# Patient Record
Sex: Male | Born: 1935 | Race: Black or African American | Hispanic: No | Marital: Married | Smoking: Former smoker
Health system: Southern US, Community
[De-identification: ages and names within clinical notes are randomized; demographics above are authoritative.]

## PROBLEM LIST (undated history)

## (undated) DIAGNOSIS — D649 Anemia, unspecified: Secondary | ICD-10-CM

## (undated) DIAGNOSIS — N179 Acute kidney failure, unspecified: Secondary | ICD-10-CM

## (undated) DIAGNOSIS — I739 Peripheral vascular disease, unspecified: Secondary | ICD-10-CM

## (undated) DIAGNOSIS — K2970 Gastritis, unspecified, without bleeding: Secondary | ICD-10-CM

## (undated) DIAGNOSIS — C61 Malignant neoplasm of prostate: Secondary | ICD-10-CM

## (undated) DIAGNOSIS — J45909 Unspecified asthma, uncomplicated: Secondary | ICD-10-CM

## (undated) DIAGNOSIS — Z8711 Personal history of peptic ulcer disease: Secondary | ICD-10-CM

## (undated) DIAGNOSIS — J189 Pneumonia, unspecified organism: Secondary | ICD-10-CM

## (undated) DIAGNOSIS — J449 Chronic obstructive pulmonary disease, unspecified: Secondary | ICD-10-CM

## (undated) DIAGNOSIS — E871 Hypo-osmolality and hyponatremia: Secondary | ICD-10-CM

## (undated) DIAGNOSIS — I2510 Atherosclerotic heart disease of native coronary artery without angina pectoris: Secondary | ICD-10-CM

## (undated) DIAGNOSIS — I209 Angina pectoris, unspecified: Secondary | ICD-10-CM

## (undated) DIAGNOSIS — M1711 Unilateral primary osteoarthritis, right knee: Secondary | ICD-10-CM

## (undated) DIAGNOSIS — R0602 Shortness of breath: Secondary | ICD-10-CM

## (undated) DIAGNOSIS — E46 Unspecified protein-calorie malnutrition: Secondary | ICD-10-CM

## (undated) DIAGNOSIS — R339 Retention of urine, unspecified: Secondary | ICD-10-CM

## (undated) DIAGNOSIS — K219 Gastro-esophageal reflux disease without esophagitis: Secondary | ICD-10-CM

## (undated) DIAGNOSIS — I10 Essential (primary) hypertension: Secondary | ICD-10-CM

## (undated) DIAGNOSIS — J42 Unspecified chronic bronchitis: Secondary | ICD-10-CM

## (undated) DIAGNOSIS — R079 Chest pain, unspecified: Secondary | ICD-10-CM

## (undated) DIAGNOSIS — E785 Hyperlipidemia, unspecified: Secondary | ICD-10-CM

## (undated) DIAGNOSIS — Z8639 Personal history of other endocrine, nutritional and metabolic disease: Secondary | ICD-10-CM

## (undated) DIAGNOSIS — G43909 Migraine, unspecified, not intractable, without status migrainosus: Secondary | ICD-10-CM

## (undated) DIAGNOSIS — I509 Heart failure, unspecified: Secondary | ICD-10-CM

## (undated) DIAGNOSIS — D759 Disease of blood and blood-forming organs, unspecified: Secondary | ICD-10-CM

## (undated) DIAGNOSIS — Z8719 Personal history of other diseases of the digestive system: Secondary | ICD-10-CM

## (undated) HISTORY — PX: TONSILLECTOMY: SUR1361

## (undated) HISTORY — DX: Retention of urine, unspecified: R33.9

## (undated) HISTORY — DX: Gastritis, unspecified, without bleeding: K29.70

## (undated) HISTORY — DX: Hypo-osmolality and hyponatremia: E87.1

## (undated) HISTORY — DX: Unilateral primary osteoarthritis, right knee: M17.11

## (undated) HISTORY — DX: Chronic obstructive pulmonary disease, unspecified: J44.9

## (undated) HISTORY — PX: JOINT REPLACEMENT: SHX530

## (undated) HISTORY — DX: Essential (primary) hypertension: I10

## (undated) HISTORY — DX: Personal history of other endocrine, nutritional and metabolic disease: Z86.39

## (undated) HISTORY — DX: Anemia, unspecified: D64.9

## (undated) HISTORY — DX: Personal history of peptic ulcer disease: Z87.11

## (undated) HISTORY — DX: Hyperlipidemia, unspecified: E78.5

## (undated) HISTORY — PX: PROSTATECTOMY: SHX69

## (undated) HISTORY — PX: APPENDECTOMY: SHX54

---
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
## 1959-04-13 DIAGNOSIS — G43909 Migraine, unspecified, not intractable, without status migrainosus: Secondary | ICD-10-CM

## 1959-04-13 HISTORY — DX: Migraine, unspecified, not intractable, without status migrainosus: G43.909

## 1998-12-29 ENCOUNTER — Other Ambulatory Visit: Admission: RE | Admit: 1998-12-29 | Discharge: 1998-12-29 | Payer: Self-pay | Admitting: Urology

## 1999-02-22 ENCOUNTER — Encounter: Payer: Self-pay | Admitting: Urology

## 1999-02-26 ENCOUNTER — Inpatient Hospital Stay (HOSPITAL_COMMUNITY): Admission: RE | Admit: 1999-02-26 | Discharge: 1999-03-02 | Payer: Self-pay | Admitting: Urology

## 1999-02-26 ENCOUNTER — Encounter (INDEPENDENT_AMBULATORY_CARE_PROVIDER_SITE_OTHER): Payer: Self-pay | Admitting: Specialist

## 2002-06-11 ENCOUNTER — Encounter: Admission: RE | Admit: 2002-06-11 | Discharge: 2002-06-11 | Payer: Self-pay | Admitting: Family Medicine

## 2002-06-11 ENCOUNTER — Encounter: Payer: Self-pay | Admitting: Family Medicine

## 2003-07-06 ENCOUNTER — Encounter: Admission: RE | Admit: 2003-07-06 | Discharge: 2003-07-06 | Payer: Self-pay | Admitting: Family Medicine

## 2003-07-21 ENCOUNTER — Emergency Department (HOSPITAL_COMMUNITY): Admission: EM | Admit: 2003-07-21 | Discharge: 2003-07-21 | Payer: Self-pay | Admitting: *Deleted

## 2004-05-07 ENCOUNTER — Emergency Department (HOSPITAL_COMMUNITY): Admission: EM | Admit: 2004-05-07 | Discharge: 2004-05-07 | Payer: Self-pay | Admitting: Emergency Medicine

## 2004-08-02 ENCOUNTER — Emergency Department (HOSPITAL_COMMUNITY): Admission: EM | Admit: 2004-08-02 | Discharge: 2004-08-02 | Payer: Self-pay

## 2006-05-12 ENCOUNTER — Ambulatory Visit: Payer: Self-pay | Admitting: Gastroenterology

## 2006-05-22 ENCOUNTER — Ambulatory Visit: Payer: Self-pay | Admitting: Gastroenterology

## 2006-08-13 ENCOUNTER — Emergency Department (HOSPITAL_COMMUNITY): Admission: EM | Admit: 2006-08-13 | Discharge: 2006-08-13 | Payer: Self-pay | Admitting: Emergency Medicine

## 2006-08-14 ENCOUNTER — Inpatient Hospital Stay (HOSPITAL_COMMUNITY): Admission: EM | Admit: 2006-08-14 | Discharge: 2006-08-24 | Payer: Self-pay | Admitting: Emergency Medicine

## 2007-04-24 ENCOUNTER — Emergency Department (HOSPITAL_COMMUNITY): Admission: EM | Admit: 2007-04-24 | Discharge: 2007-04-24 | Payer: Self-pay | Admitting: Emergency Medicine

## 2007-11-02 ENCOUNTER — Encounter: Admission: RE | Admit: 2007-11-02 | Discharge: 2007-11-02 | Payer: Self-pay | Admitting: Internal Medicine

## 2008-10-04 ENCOUNTER — Encounter: Admission: RE | Admit: 2008-10-04 | Discharge: 2008-10-04 | Payer: Self-pay | Admitting: Internal Medicine

## 2009-09-20 ENCOUNTER — Inpatient Hospital Stay (HOSPITAL_COMMUNITY): Admission: EM | Admit: 2009-09-20 | Discharge: 2009-09-25 | Payer: Self-pay | Admitting: Emergency Medicine

## 2009-09-21 ENCOUNTER — Encounter (INDEPENDENT_AMBULATORY_CARE_PROVIDER_SITE_OTHER): Payer: Self-pay | Admitting: Internal Medicine

## 2010-10-31 LAB — CARDIAC PANEL(CRET KIN+CKTOT+MB+TROPI)
CK, MB: 5.9 ng/mL — ABNORMAL HIGH (ref 0.3–4.0)
Relative Index: 1.1 (ref 0.0–2.5)
Troponin I: 0.03 ng/mL (ref 0.00–0.06)

## 2010-10-31 LAB — GLUCOSE, CAPILLARY
Glucose-Capillary: 103 mg/dL — ABNORMAL HIGH (ref 70–99)
Glucose-Capillary: 104 mg/dL — ABNORMAL HIGH (ref 70–99)
Glucose-Capillary: 113 mg/dL — ABNORMAL HIGH (ref 70–99)
Glucose-Capillary: 114 mg/dL — ABNORMAL HIGH (ref 70–99)
Glucose-Capillary: 117 mg/dL — ABNORMAL HIGH (ref 70–99)
Glucose-Capillary: 124 mg/dL — ABNORMAL HIGH (ref 70–99)
Glucose-Capillary: 124 mg/dL — ABNORMAL HIGH (ref 70–99)
Glucose-Capillary: 143 mg/dL — ABNORMAL HIGH (ref 70–99)
Glucose-Capillary: 211 mg/dL — ABNORMAL HIGH (ref 70–99)

## 2010-10-31 LAB — CBC
HCT: 29.4 % — ABNORMAL LOW (ref 39.0–52.0)
HCT: 30.1 % — ABNORMAL LOW (ref 39.0–52.0)
HCT: 36.5 % — ABNORMAL LOW (ref 39.0–52.0)
Hemoglobin: 12.6 g/dL — ABNORMAL LOW (ref 13.0–17.0)
MCHC: 34.4 g/dL (ref 30.0–36.0)
MCV: 87.1 fL (ref 78.0–100.0)
MCV: 87.8 fL (ref 78.0–100.0)
Platelets: 174 10*3/uL (ref 150–400)
Platelets: 186 10*3/uL (ref 150–400)
RBC: 3.46 MIL/uL — ABNORMAL LOW (ref 4.22–5.81)
RBC: 4.16 MIL/uL — ABNORMAL LOW (ref 4.22–5.81)
RDW: 13.3 % (ref 11.5–15.5)
RDW: 14.1 % (ref 11.5–15.5)
WBC: 12.2 10*3/uL — ABNORMAL HIGH (ref 4.0–10.5)
WBC: 4.5 10*3/uL (ref 4.0–10.5)

## 2010-10-31 LAB — CULTURE, BLOOD (ROUTINE X 2)
Culture: NO GROWTH
Culture: NO GROWTH

## 2010-10-31 LAB — BASIC METABOLIC PANEL
BUN: 11 mg/dL (ref 6–23)
CO2: 27 mEq/L (ref 19–32)
Calcium: 8.9 mg/dL (ref 8.4–10.5)
Chloride: 101 mEq/L (ref 96–112)
Creatinine, Ser: 0.91 mg/dL (ref 0.4–1.5)
GFR calc Af Amer: >60 mL/min (ref >60)
GFR calc non Af Amer: >60 mL/min (ref >60)
Glucose, Bld: 110 mg/dL — ABNORMAL HIGH (ref 70–99)
Potassium: 3.4 mEq/L — ABNORMAL LOW (ref 3.5–5.1)
Sodium: 137 mEq/L (ref 135–145)

## 2010-10-31 LAB — CULTURE, RESPIRATORY W GRAM STAIN

## 2010-10-31 LAB — DIFFERENTIAL
Basophils Relative: 1 % (ref 0–1)
Eosinophils Absolute: 0 10*3/uL (ref 0.0–0.7)
Lymphocytes Relative: 7 % — ABNORMAL LOW (ref 12–46)
Lymphs Abs: 0.9 10*3/uL (ref 0.7–4.0)
Monocytes Absolute: 2.3 10*3/uL — ABNORMAL HIGH (ref 0.1–1.0)
Neutro Abs: 8.9 10*3/uL — ABNORMAL HIGH (ref 1.7–7.7)

## 2010-10-31 LAB — LEGIONELLA ANTIGEN, URINE: Legionella Antigen, Urine: NEGATIVE

## 2010-10-31 LAB — COMPREHENSIVE METABOLIC PANEL
BUN: 15 mg/dL (ref 6–23)
CO2: 28 mEq/L (ref 19–32)
Chloride: 101 mEq/L (ref 96–112)
Creatinine, Ser: 1.02 mg/dL (ref 0.4–1.5)
GFR calc non Af Amer: >60 mL/min (ref >60)
Total Bilirubin: 0.3 mg/dL (ref 0.3–1.2)

## 2010-10-31 LAB — EXPECTORATED SPUTUM ASSESSMENT W GRAM STAIN, RFLX TO RESP C

## 2010-10-31 LAB — POCT CARDIAC MARKERS: CKMB, poc: 1.3 ng/mL (ref 1.0–8.0)

## 2010-11-16 ENCOUNTER — Encounter (HOSPITAL_COMMUNITY)
Admission: RE | Admit: 2010-11-16 | Discharge: 2010-11-16 | Disposition: A | Discharge status: Home / Self Care | Payer: Medicare Other | Source: Ambulatory Visit | Attending: Orthopaedic Surgery | Admitting: Orthopaedic Surgery

## 2010-11-16 DIAGNOSIS — Z01818 Encounter for other preprocedural examination: Secondary | ICD-10-CM | POA: Insufficient documentation

## 2010-11-16 DIAGNOSIS — Z01812 Encounter for preprocedural laboratory examination: Secondary | ICD-10-CM | POA: Insufficient documentation

## 2010-11-16 LAB — CBC
HCT: 33.2 % — ABNORMAL LOW (ref 39.0–52.0)
Hemoglobin: 11.2 g/dL — ABNORMAL LOW (ref 13.0–17.0)
MCH: 28.6 pg (ref 26.0–34.0)
MCHC: 33.7 g/dL (ref 30.0–36.0)
MCV: 84.7 fL (ref 78.0–100.0)

## 2010-11-16 LAB — BASIC METABOLIC PANEL
BUN: 20 mg/dL (ref 6–23)
Chloride: 107 mEq/L (ref 96–112)
Glucose, Bld: 104 mg/dL — ABNORMAL HIGH (ref 70–99)
Potassium: 4.1 mEq/L (ref 3.5–5.1)
Sodium: 136 mEq/L (ref 135–145)

## 2010-11-16 LAB — ABO/RH: ABO/RH(D): A POS

## 2010-11-22 ENCOUNTER — Inpatient Hospital Stay (HOSPITAL_COMMUNITY)
Admission: RE | Admit: 2010-11-22 | Discharge: 2010-12-04 | DRG: 470 | Disposition: A | Discharge status: Home Health | Payer: Medicare Other | Source: Ambulatory Visit | Attending: Orthopaedic Surgery | Admitting: Orthopaedic Surgery

## 2010-11-22 ENCOUNTER — Other Ambulatory Visit (HOSPITAL_COMMUNITY): Payer: Self-pay | Admitting: Orthopaedic Surgery

## 2010-11-22 ENCOUNTER — Ambulatory Visit (HOSPITAL_COMMUNITY)
Admission: RE | Admit: 2010-11-22 | Discharge: 2010-11-22 | Disposition: A | Discharge status: Home / Self Care | Payer: Medicare Other | Source: Ambulatory Visit | Attending: Orthopaedic Surgery | Admitting: Orthopaedic Surgery

## 2010-11-22 DIAGNOSIS — B3781 Candidal esophagitis: Secondary | ICD-10-CM | POA: Diagnosis present

## 2010-11-22 DIAGNOSIS — D509 Iron deficiency anemia, unspecified: Secondary | ICD-10-CM | POA: Diagnosis not present

## 2010-11-22 DIAGNOSIS — E876 Hypokalemia: Secondary | ICD-10-CM | POA: Diagnosis not present

## 2010-11-22 DIAGNOSIS — M1990 Unspecified osteoarthritis, unspecified site: Secondary | ICD-10-CM

## 2010-11-22 DIAGNOSIS — Z8546 Personal history of malignant neoplasm of prostate: Secondary | ICD-10-CM

## 2010-11-22 DIAGNOSIS — R339 Retention of urine, unspecified: Secondary | ICD-10-CM | POA: Diagnosis not present

## 2010-11-22 DIAGNOSIS — M1710 Unilateral primary osteoarthritis, unspecified knee: Principal | ICD-10-CM | POA: Diagnosis present

## 2010-11-22 DIAGNOSIS — I10 Essential (primary) hypertension: Secondary | ICD-10-CM | POA: Diagnosis present

## 2010-11-22 DIAGNOSIS — J449 Chronic obstructive pulmonary disease, unspecified: Secondary | ICD-10-CM | POA: Diagnosis present

## 2010-11-22 DIAGNOSIS — K5900 Constipation, unspecified: Secondary | ICD-10-CM | POA: Diagnosis not present

## 2010-11-22 DIAGNOSIS — K219 Gastro-esophageal reflux disease without esophagitis: Secondary | ICD-10-CM | POA: Diagnosis present

## 2010-11-22 DIAGNOSIS — Z7982 Long term (current) use of aspirin: Secondary | ICD-10-CM

## 2010-11-22 DIAGNOSIS — D62 Acute posthemorrhagic anemia: Secondary | ICD-10-CM | POA: Diagnosis not present

## 2010-11-22 DIAGNOSIS — R1013 Epigastric pain: Secondary | ICD-10-CM | POA: Diagnosis not present

## 2010-11-22 DIAGNOSIS — E871 Hypo-osmolality and hyponatremia: Secondary | ICD-10-CM | POA: Diagnosis not present

## 2010-11-22 DIAGNOSIS — IMO0002 Reserved for concepts with insufficient information to code with codable children: Secondary | ICD-10-CM | POA: Diagnosis not present

## 2010-11-22 DIAGNOSIS — N1330 Unspecified hydronephrosis: Secondary | ICD-10-CM | POA: Diagnosis present

## 2010-11-22 DIAGNOSIS — J4489 Other specified chronic obstructive pulmonary disease: Secondary | ICD-10-CM | POA: Diagnosis present

## 2010-11-22 DIAGNOSIS — N9989 Other postprocedural complications and disorders of genitourinary system: Secondary | ICD-10-CM | POA: Diagnosis not present

## 2010-11-22 HISTORY — PX: REPLACEMENT TOTAL KNEE: SUR1224

## 2010-11-22 LAB — COMPREHENSIVE METABOLIC PANEL
ALT: 14 U/L (ref 0–53)
AST: 27 U/L (ref 0–37)
Alkaline Phosphatase: 63 U/L (ref 39–117)
CO2: 22 mEq/L (ref 19–32)
Calcium: 8.5 mg/dL (ref 8.4–10.5)
Chloride: 105 mEq/L (ref 96–112)
GFR calc Af Amer: >60 mL/min (ref >60)
GFR calc non Af Amer: >60 mL/min (ref >60)
Glucose, Bld: 253 mg/dL — ABNORMAL HIGH (ref 70–99)
Sodium: 136 mEq/L (ref 135–145)
Total Bilirubin: 0.4 mg/dL (ref 0.3–1.2)

## 2010-11-22 LAB — LIPID PANEL
HDL: 58 mg/dL
Total CHOL/HDL Ratio: 2.9 ratio
Triglycerides: 52 mg/dL
VLDL: 10 mg/dL (ref 0–40)

## 2010-11-22 LAB — CARDIAC PANEL(CRET KIN+CKTOT+MB+TROPI)
CK, MB: 8.5 ng/mL (ref 0.3–4.0)
Relative Index: 3.4 — ABNORMAL HIGH (ref 0.0–2.5)
Total CK: 252 U/L — ABNORMAL HIGH (ref 7–232)
Troponin I: 0.03 ng/mL (ref 0.00–0.06)

## 2010-11-22 LAB — CBC
HCT: 22.9 % — ABNORMAL LOW (ref 39.0–52.0)
Hemoglobin: 7.7 g/dL — ABNORMAL LOW (ref 13.0–17.0)
MCHC: 33.6 g/dL (ref 30.0–36.0)
RBC: 2.68 MIL/uL — ABNORMAL LOW (ref 4.22–5.81)

## 2010-11-22 LAB — LIPASE, BLOOD: Lipase: 16 U/L (ref 11–59)

## 2010-11-23 LAB — CARDIAC PANEL(CRET KIN+CKTOT+MB+TROPI)
CK, MB: 7.3 ng/mL (ref 0.3–4.0)
Relative Index: 2.1 (ref 0.0–2.5)
Relative Index: 1.7 (ref 0.0–2.5)
Troponin I: 0.03 ng/mL (ref 0.00–0.06)

## 2010-11-23 LAB — BASIC METABOLIC PANEL
BUN: 12 mg/dL (ref 6–23)
Calcium: 8.4 mg/dL (ref 8.4–10.5)
Creatinine, Ser: 1.19 mg/dL (ref 0.4–1.5)
GFR calc non Af Amer: 59 mL/min — ABNORMAL LOW (ref >60)
Potassium: 4 mEq/L (ref 3.5–5.1)

## 2010-11-23 LAB — HEMOGLOBIN A1C: Hgb A1c MFr Bld: 6.6 % — ABNORMAL HIGH (ref <5.7)

## 2010-11-23 LAB — CBC
MCH: 28.7 pg (ref 26.0–34.0)
MCHC: 33.9 g/dL (ref 30.0–36.0)
MCV: 84.7 fL (ref 78.0–100.0)
Platelets: 167 10*3/uL (ref 150–400)
RBC: 2.61 MIL/uL — ABNORMAL LOW (ref 4.22–5.81)
RDW: 13.9 % (ref 11.5–15.5)

## 2010-11-23 LAB — IRON AND TIBC
Iron: 23 ug/dL — ABNORMAL LOW (ref 42–135)
Saturation Ratios: 7 % — ABNORMAL LOW (ref 20–55)
TIBC: 314 ug/dL (ref 215–435)

## 2010-11-23 LAB — PROTIME-INR: Prothrombin Time: 13.6 seconds (ref 11.6–15.2)

## 2010-11-24 ENCOUNTER — Inpatient Hospital Stay (HOSPITAL_COMMUNITY): Payer: Medicare Other

## 2010-11-24 LAB — BRAIN NATRIURETIC PEPTIDE: Pro B Natriuretic peptide (BNP): 345 pg/mL — ABNORMAL HIGH (ref 0.0–100.0)

## 2010-11-24 LAB — TYPE AND SCREEN

## 2010-11-24 LAB — CBC
HCT: 24.7 % — ABNORMAL LOW (ref 39.0–52.0)
Hemoglobin: 8.5 g/dL — ABNORMAL LOW (ref 13.0–17.0)
MCH: 28.7 pg (ref 26.0–34.0)
MCHC: 34.4 g/dL (ref 30.0–36.0)
RBC: 2.96 MIL/uL — ABNORMAL LOW (ref 4.22–5.81)

## 2010-11-24 LAB — BASIC METABOLIC PANEL
BUN: 10 mg/dL (ref 6–23)
Chloride: 91 mEq/L — ABNORMAL LOW (ref 96–112)
Glucose, Bld: 109 mg/dL — ABNORMAL HIGH (ref 70–99)
Potassium: 3.5 mEq/L (ref 3.5–5.1)
Sodium: 131 mEq/L — ABNORMAL LOW (ref 135–145)

## 2010-11-25 LAB — BASIC METABOLIC PANEL
BUN: 10 mg/dL (ref 6–23)
Chloride: 90 mEq/L — ABNORMAL LOW (ref 96–112)
GFR calc non Af Amer: >60 mL/min (ref >60)
Potassium: 3.7 mEq/L (ref 3.5–5.1)
Sodium: 132 mEq/L — ABNORMAL LOW (ref 135–145)

## 2010-11-25 LAB — PROTIME-INR
INR: 1.65 — ABNORMAL HIGH (ref 0.00–1.49)
Prothrombin Time: 19.7 seconds — ABNORMAL HIGH (ref 11.6–15.2)

## 2010-11-25 LAB — CBC
HCT: 20.5 % — ABNORMAL LOW (ref 39.0–52.0)
HCT: 21.4 % — ABNORMAL LOW (ref 39.0–52.0)
Hemoglobin: 7.2 g/dL — ABNORMAL LOW (ref 13.0–17.0)
Hemoglobin: 7.5 g/dL — ABNORMAL LOW (ref 13.0–17.0)
MCH: 29.4 pg (ref 26.0–34.0)
MCHC: 35.1 g/dL (ref 30.0–36.0)
MCV: 83.6 fL (ref 78.0–100.0)
RBC: 2.45 MIL/uL — ABNORMAL LOW (ref 4.22–5.81)
RDW: 13.7 % (ref 11.5–15.5)
WBC: 10.3 10*3/uL (ref 4.0–10.5)

## 2010-11-26 ENCOUNTER — Inpatient Hospital Stay (HOSPITAL_COMMUNITY): Payer: Medicare Other

## 2010-11-26 DIAGNOSIS — R634 Abnormal weight loss: Secondary | ICD-10-CM

## 2010-11-26 DIAGNOSIS — R1013 Epigastric pain: Secondary | ICD-10-CM

## 2010-11-26 LAB — CBC
HCT: 23 % — ABNORMAL LOW (ref 39.0–52.0)
MCH: 28.8 pg (ref 26.0–34.0)
MCHC: 35.2 g/dL (ref 30.0–36.0)
MCV: 81.9 fL (ref 78.0–100.0)
Platelets: 170 10*3/uL (ref 150–400)
RDW: 13.6 % (ref 11.5–15.5)

## 2010-11-26 LAB — IRON AND TIBC
Saturation Ratios: 19 % — ABNORMAL LOW (ref 20–55)
UIBC: 229 ug/dL

## 2010-11-26 LAB — LIPASE, BLOOD: Lipase: 15 U/L (ref 11–59)

## 2010-11-27 ENCOUNTER — Inpatient Hospital Stay (HOSPITAL_COMMUNITY): Payer: Medicare Other

## 2010-11-27 ENCOUNTER — Other Ambulatory Visit: Payer: Self-pay | Admitting: Internal Medicine

## 2010-11-27 DIAGNOSIS — B3781 Candidal esophagitis: Secondary | ICD-10-CM

## 2010-11-27 DIAGNOSIS — K2970 Gastritis, unspecified, without bleeding: Secondary | ICD-10-CM

## 2010-11-27 DIAGNOSIS — K2990 Gastroduodenitis, unspecified, without bleeding: Secondary | ICD-10-CM

## 2010-11-27 DIAGNOSIS — R1013 Epigastric pain: Secondary | ICD-10-CM

## 2010-11-27 DIAGNOSIS — R634 Abnormal weight loss: Secondary | ICD-10-CM

## 2010-11-27 LAB — CBC
HCT: 22.6 % — ABNORMAL LOW (ref 39.0–52.0)
Hemoglobin: 8 g/dL — ABNORMAL LOW (ref 13.0–17.0)
MCH: 29.1 pg (ref 26.0–34.0)
MCHC: 35.4 g/dL (ref 30.0–36.0)
MCV: 82.2 fL (ref 78.0–100.0)

## 2010-11-27 LAB — CROSSMATCH
Antibody Screen: NEGATIVE
Unit division: 0

## 2010-11-27 LAB — PROTIME-INR: Prothrombin Time: 23.4 seconds — ABNORMAL HIGH (ref 11.6–15.2)

## 2010-11-27 LAB — VITAMIN B12: Vitamin B-12: 1081 pg/mL — ABNORMAL HIGH (ref 211–911)

## 2010-11-27 MED ORDER — IOHEXOL 300 MG/ML  SOLN
80.0000 mL | Freq: Once | INTRAMUSCULAR | Status: AC | PRN
  Administered 2010-11-27: 80 mL via INTRAVENOUS

## 2010-11-28 DIAGNOSIS — B3781 Candidal esophagitis: Secondary | ICD-10-CM

## 2010-11-28 DIAGNOSIS — R1013 Epigastric pain: Secondary | ICD-10-CM

## 2010-11-28 DIAGNOSIS — R634 Abnormal weight loss: Secondary | ICD-10-CM

## 2010-11-28 LAB — CBC
HCT: 22.9 % — ABNORMAL LOW (ref 39.0–52.0)
Hemoglobin: 8 g/dL — ABNORMAL LOW (ref 13.0–17.0)
MCH: 28.7 pg (ref 26.0–34.0)
MCV: 82.1 fL (ref 78.0–100.0)
RBC: 2.79 MIL/uL — ABNORMAL LOW (ref 4.22–5.81)

## 2010-11-28 LAB — URINALYSIS, ROUTINE W REFLEX MICROSCOPIC
Bilirubin Urine: NEGATIVE
Specific Gravity, Urine: 1.009 (ref 1.005–1.030)
pH: 7.5 (ref 5.0–8.0)

## 2010-11-28 LAB — BASIC METABOLIC PANEL
CO2: 40 mEq/L (ref 19–32)
Chloride: 79 mEq/L — ABNORMAL LOW (ref 96–112)
Glucose, Bld: 100 mg/dL — ABNORMAL HIGH (ref 70–99)
Potassium: 2.4 mEq/L — CL (ref 3.5–5.1)
Sodium: 128 mEq/L — ABNORMAL LOW (ref 135–145)

## 2010-11-28 LAB — URINE MICROSCOPIC-ADD ON

## 2010-11-29 DIAGNOSIS — K2990 Gastroduodenitis, unspecified, without bleeding: Secondary | ICD-10-CM

## 2010-11-29 DIAGNOSIS — K2970 Gastritis, unspecified, without bleeding: Secondary | ICD-10-CM

## 2010-11-29 LAB — BASIC METABOLIC PANEL
BUN: 11 mg/dL (ref 6–23)
CO2: 36 mEq/L — ABNORMAL HIGH (ref 19–32)
Calcium: 8.3 mg/dL — ABNORMAL LOW (ref 8.4–10.5)
Creatinine, Ser: 1.28 mg/dL (ref 0.4–1.5)
GFR calc non Af Amer: 54 mL/min — ABNORMAL LOW (ref >60)
Glucose, Bld: 171 mg/dL — ABNORMAL HIGH (ref 70–99)
Sodium: 131 mEq/L — ABNORMAL LOW (ref 135–145)

## 2010-11-29 LAB — BASIC METABOLIC PANEL WITH GFR
BUN: 10 mg/dL (ref 6–23)
CO2: 41 meq/L (ref 19–32)
Calcium: 8.4 mg/dL (ref 8.4–10.5)
Chloride: 84 meq/L — ABNORMAL LOW (ref 96–112)
Creatinine, Ser: 1.11 mg/dL (ref 0.4–1.5)
GFR calc non Af Amer: >60 mL/min
Glucose, Bld: 104 mg/dL — ABNORMAL HIGH (ref 70–99)
Potassium: 3 meq/L — ABNORMAL LOW (ref 3.5–5.1)
Sodium: 131 meq/L — ABNORMAL LOW (ref 135–145)

## 2010-11-29 LAB — CBC
HCT: 21.2 % — ABNORMAL LOW (ref 39.0–52.0)
Hemoglobin: 7.4 g/dL — ABNORMAL LOW (ref 13.0–17.0)
MCH: 28.8 pg (ref 26.0–34.0)
MCHC: 34.9 g/dL (ref 30.0–36.0)
MCV: 82.5 fL (ref 78.0–100.0)
Platelets: 224 10*3/uL (ref 150–400)
RBC: 2.57 MIL/uL — ABNORMAL LOW (ref 4.22–5.81)
RDW: 12.8 % (ref 11.5–15.5)
WBC: 6.1 10*3/uL (ref 4.0–10.5)

## 2010-11-29 LAB — PROTIME-INR: Prothrombin Time: 38 seconds — ABNORMAL HIGH (ref 11.6–15.2)

## 2010-11-30 ENCOUNTER — Other Ambulatory Visit (HOSPITAL_COMMUNITY): Payer: Medicare Other

## 2010-11-30 ENCOUNTER — Inpatient Hospital Stay (HOSPITAL_COMMUNITY): Payer: Medicare Other

## 2010-11-30 LAB — PROTIME-INR: INR: 4.01 — ABNORMAL HIGH (ref 0.00–1.49)

## 2010-11-30 LAB — BASIC METABOLIC PANEL
BUN: 8 mg/dL (ref 6–23)
Calcium: 8.2 mg/dL — ABNORMAL LOW (ref 8.4–10.5)
GFR calc Af Amer: >60 mL/min (ref >60)
GFR calc non Af Amer: >60 mL/min (ref >60)
Glucose, Bld: 114 mg/dL — ABNORMAL HIGH (ref 70–99)
Sodium: 133 mEq/L — ABNORMAL LOW (ref 135–145)

## 2010-11-30 LAB — URINE CULTURE: Culture  Setup Time: 201204190456

## 2010-11-30 LAB — CBC
HCT: 21.3 % — ABNORMAL LOW (ref 39.0–52.0)
Hemoglobin: 7.3 g/dL — ABNORMAL LOW (ref 13.0–17.0)
MCV: 84.2 fL (ref 78.0–100.0)
RBC: 2.53 MIL/uL — ABNORMAL LOW (ref 4.22–5.81)
WBC: 7.4 10*3/uL (ref 4.0–10.5)

## 2010-12-01 ENCOUNTER — Inpatient Hospital Stay (HOSPITAL_COMMUNITY): Payer: Medicare Other

## 2010-12-01 LAB — BASIC METABOLIC PANEL
BUN: 9 mg/dL (ref 6–23)
Chloride: 93 mEq/L — ABNORMAL LOW (ref 96–112)
Creatinine, Ser: 1 mg/dL (ref 0.4–1.5)

## 2010-12-01 LAB — PROTIME-INR
INR: 3.68 — ABNORMAL HIGH (ref 0.00–1.49)
Prothrombin Time: 36.5 seconds — ABNORMAL HIGH (ref 11.6–15.2)

## 2010-12-01 LAB — CBC
HCT: 19.3 % — ABNORMAL LOW (ref 39.0–52.0)
MCHC: 34.2 g/dL (ref 30.0–36.0)
RDW: 13.5 % (ref 11.5–15.5)

## 2010-12-01 LAB — HEMOGLOBIN AND HEMATOCRIT, BLOOD: Hemoglobin: 8.5 g/dL — ABNORMAL LOW (ref 13.0–17.0)

## 2010-12-01 LAB — MAGNESIUM: Magnesium: 2.1 mg/dL (ref 1.5–2.5)

## 2010-12-02 ENCOUNTER — Inpatient Hospital Stay (HOSPITAL_COMMUNITY): Payer: Medicare Other

## 2010-12-02 LAB — BASIC METABOLIC PANEL
BUN: 10 mg/dL (ref 6–23)
CO2: 33 mEq/L — ABNORMAL HIGH (ref 19–32)
Calcium: 8.3 mg/dL — ABNORMAL LOW (ref 8.4–10.5)
Creatinine, Ser: 1.04 mg/dL (ref 0.4–1.5)
Glucose, Bld: 96 mg/dL (ref 70–99)
Sodium: 134 mEq/L — ABNORMAL LOW (ref 135–145)

## 2010-12-02 LAB — CROSSMATCH: Unit division: 0

## 2010-12-02 LAB — CBC
MCH: 29.3 pg (ref 26.0–34.0)
MCHC: 34.4 g/dL (ref 30.0–36.0)
MCV: 85 fL (ref 78.0–100.0)
Platelets: 406 10*3/uL — ABNORMAL HIGH (ref 150–400)
RDW: 13.7 % (ref 11.5–15.5)

## 2010-12-02 LAB — PROTIME-INR: Prothrombin Time: 32.6 seconds — ABNORMAL HIGH (ref 11.6–15.2)

## 2010-12-03 LAB — CBC
Hemoglobin: 8.5 g/dL — ABNORMAL LOW (ref 13.0–17.0)
MCH: 28.2 pg (ref 26.0–34.0)
MCHC: 33.1 g/dL (ref 30.0–36.0)
RDW: 13.8 % (ref 11.5–15.5)

## 2010-12-03 LAB — PROTIME-INR: Prothrombin Time: 27.6 seconds — ABNORMAL HIGH (ref 11.6–15.2)

## 2010-12-03 NOTE — Consult Note (Signed)
NAMEKELLON, Cristian Collins              ACCOUNT NO.:  [id number]  MEDICAL RECORD NO.:  [id number]           PATIENT TYPE:  O  LOCATION:  XRAY                         FACILITY:  [facility]  PHYSICIAN:  Mariea Stable, MD   DATE OF BIRTH:  1929/10/19  DATE OF CONSULTATION:  11/22/2010 DATE OF DISCHARGE:                                CONSULTATION   REASON FOR CONSULTATION:  Epigastric/chest pain postop.  HISTORY OF PRESENT ILLNESS:  Cristian Collins is a 77 year old gentleman with past medical history significant for COPD, hypertension, hyperlipidemia, partial gastrectomy for an ulcer in the 1950s, who presented for elective right total knee replacement.  The patient underwent surgery earlier today and after surgery requested some coffee.  The patient was given some coffee and subsequently developed some epigastric burning sensation radiating into the chest along with repeated dry heaving.  The patient was given some Mylanta followed by some Reglan along with morphine and over time the symptoms have relieved.  He still reports some ongoing discomfort, though this is a markedly improved.  He states he has really never had anything like this before.  He has difficulty describing it but says it was overall a mild discomfort associated with repeated dry heaving.  The discomfort did not radiate anywhere except for the areas mentioned above.  Again, he had multiple medications given including Mylanta, Reglan, morphine with some improvement over time.  Of note, the patient does report that he is active at home and does not experience any chest pain with his activities.  He does report some dyspnea on exertion which resolves with rest.  He also notes some left lower chest discomfort on occasion but has again difficulty elaborating on this.  EKG was obtained during the patient's reported chest pain and shows no acute ischemic changes.  Actually, it is a pretty normal electrocardiogram.  [facility] were consulted to assist with further workup.  PAST MEDICAL HISTORY: 1. Status post right total knee replacement postop day zero. 2. COPD. 3. Hyperlipidemia. 4. Hypertension. 5. History of prostate cancer status post prostatectomy. 6. Partial gastrectomy for an ulcer in 1950s.  MEDICATIONS: 1. Systane eyedrops 1 drop in bilateral eyes p.r.n. 2. Albuterol nebs 2.5 mg inhaled q.6 h. P.r.n. 3. Advair Diskus 250/50 mg 1 puff inhaled b.i.d. 4. Spiriva 18 mcg inhaled daily. 5. ProAir 1 puff inhaled q.4 h. P.r.n. 6. Amlodipine 10 mg p.o. daily. 7. Diovan, HCTZ 320/12.5 mg p.o. daily. 8. Lisinopril/HCTZ 20/12.5 mg p.o. daily.  ALLERGIES:  No known drug allergies.  FAMILY HISTORY:  Significant for breast cancer in his mother.  SOCIAL HISTORY:  The patient lives with his wife.  He quit smoking approximately 30 years ago and denies any alcohol or drug abuse.  REVIEW OF SYSTEMS:  As per HPI.  All others reviewed and negative.  PHYSICAL EXAMINATION:  VITAL SIGNS:  Temperature 97.6, blood pressure 186/70, pulse of 55, respirations 18, O2 saturation 100% on 2 L. GENERAL:  This is an elderly gentleman lying in bed in no acute distress. HEENT:  Head is normocephalic, atraumatic.  He is wearing glasses. Pupils are equally round and reactive to  light.  Extraocular movements are intact.  Sclerae anicteric.  There is arcus senilis present.  Mucous membranes are dry.  There are no oropharyngeal lesions. NECK:  Supple.  There is no JVD.  There is no carotid bruit. HEART:  Normal S1, S2 with a regular rate and rhythm.  There are no murmurs, gallops, or rubs.  PMI is displaced inferiorly.  LUNGS:  There is good air movement bilaterally, otherwise clear. ABDOMEN:  Positive bowel sounds, soft, nondistended.  There is a mild tenderness in the epigastric area with palpation that reproduces the discomfort mentioned in the HPI. EXTREMITIES:  The patient has no lower extremity edema on the  left side. The right lower extremity is bandaged and is currently undergoing passive range of motion. NEUROLOGIC:  The patient is awake, alert, and oriented x3.  Cranial nerves are grossly intact.  Motor appears intact times at least the bilateral upper and left lower extremity.  Sensation is grossly intact.  LABORATORY DATA:  Preop labs from April 6 include a WBC 3.6, hemoglobin 11.2, platelets 229.  PT of 12.9, INR 0.95.  Sodium 136, potassium 4.1, chloride 107, bicarb 24, glucose 104, BUN 20, creatinine 1.5, calcium 9.3. Electrocardiogram shows sinus rhythm with a ventricular rate of 62 and no acute ischemic changes.  ASSESSMENT/PLAN: 1. Epigastric/chest discomfort.  This is overall atypical presentation     for an acute coronary syndrome, however the patient does have risk     factors including male gender, age, hypertension, and     hyperlipidemia.  Per history and physical exam, most likely a GI     origin.  At this point, we will go ahead and transfer to Telemetry     to monitor for monitor for 23 hours and rule out acute coronary     syndrome with serial cardiac enzymes.  We will go ahead and check a     CBC to assess hemoglobin.  We will go ahead and risk stratify witha fasting lipid panel and a hemoglobin A1c.  We will also assess     for other GI etiologies with complete metabolic panel to assess     LFTs along with a lipase.  In the meantime, we will go ahead and     give the patient a GI cocktail.  He is surgically symptom free at     this point.  We will start Protonix 40 mg p.o. daily.  We will     provide Zofran on a p.r.n. basis as well as initiating aspirin 325     mg p.o. x1 dose now and sublingual nitroglycerin p.r.n. further     episodes of chest pain. 2. Status post right total knee replacement.  This will be per     Orthopedics. 3. Hyperlipidemia.  The patient was previously on a statin, although     his home medication list does not have that currently.  We  will     check a fasting lipid panel as per #1 and consider starting a     statin depending on whether     the patient rules in or not. 4. Hypertension.  We will continue the patient's home medications.  Thank you for allowing [place] to assist with the care of this very pleasant patient.     Mariea Stable, MD     MA/MEDQ  D:  11/22/2010  T:  11/22/2010  Job:  [id number]  Electronically Signed by Mariea Stable MD on 12/03/2010 10:30:01 AM

## 2010-12-11 NOTE — Discharge Summary (Addendum)
NAMEMarland Collins  TRAVANTE, KNEE              ACCOUNT NO.:  [id number]  MEDICAL RECORD NO.:  [id number]           PATIENT TYPE:  LOCATION:                                 FACILITY:  PHYSICIAN:  Lubertha Basque. Shaylan Tutton, M.D.DATE OF BIRTH:  12/16/1929  DATE OF ADMISSION:  11/22/2010 DATE OF DISCHARGE:  12/03/2010                              DISCHARGE SUMMARY   ADMITTING DIAGNOSES: 1. Right knee degenerative joint disease. 2. History of chronic obstructive pulmonary disease. 3. Hyperlipidemia. 4. Hypertension. 5. History of prostate cancer. 6. History of peptic ulcer disease with partial gastrectomy and     retropubic prostatectomy.  DISCHARGE DIAGNOSES: 1. Right knee degenerative joint disease. 2. History of chronic obstructive pulmonary disease. 3. Hyperlipidemia. 4. Hypertension. 5. History of prostate cancer. 6. History of peptic ulcer disease with partial gastrectomy and     retropubic prostatectomy. 7. Anemia. 8. Urinary retention and resolved hydronephrosis. 9. Gastritis and esophageal candida. 10.Hyponatremia and hypokalemia.  OPERATIONS:  Right total knee replacement.  BRIEF HISTORY:  Cristian Collins is a 77 year old male who is well-known to our practice who has had increasing right knee pain, pain with every step.  He has failed anti-inflammatory medicines and corticosteroid injections.  He is having pain with every step, trouble sleeping at nighttime as x-rays reveal end-stage DJD.  We have discussed treatment options with him that being total knee replacement on the right side.  PERTINENT LABORATORY AND X-RAY FINDINGS:  Hemoglobin 8.5, hematocrit 25.7.  Blood during his hospital stay was replaced as necessary, platelets of 468,000, MCV of 85.4.  Chem-7:  Sodium 134, potassium 4.3, chloride 95, BUN 10, creatinine 1.04, glucose 96.  GI lab work, LIP at 15, calcium 8.3.  Urinalysis showed trace leukocyte but essentially normal.  Coumadin was monitored by pharmacy and the last INR  was 2.56. X-rays and radiology reports showed some hydronephrosis after his postop urinary retention and was followed up with the ultrasound of his kidneys which showed no hydronephrosis or renal mass.  This was done December 01, 2010 and then our chest x-rays and abdomen x-rays which are inclusive in electronic medical record.  COURSE IN THE HOSPITAL:  He was admitted postoperatively and on the first day to postop, he was doing fine.  He started to develop some GI pains, nausea, vomiting, so [facility] were consulted.  They tried managing him with various medication changes and then later called in a GI consult who did an EGD which revealed gastritis and esophageal candida which he was treated for appropriately.  His hemoglobin did drop.  His blood was RBCs, replaced as necessary, and had difficulty voiding and had urinary retention on a bladder scan.  So a urology consultation was ordered.  A Foley catheter was put in and then a followup ultrasound of the kidneys was done which showed no evidence of hydronephrosis at that point. His wound on his knee during his entire hospital stay was benign.  No sign of infection and drainage.  Motion with the help of a CPM machine and physical therapy increased and last check was just a couple degrees from full extension to 95 to 100  degrees of full flexion.  Staples were left in until the 2-week postoperative mark and then it would be removed in our office.  He had during his hospital stay used an incentive spirometer, could be weightbearing as tolerated, and pulsating air stockings bilaterally throughout his entire hospital stay.  At different times, he had trouble with nausea and vomiting which eventually cleared and he was able to eat a essentially regular diet without nausea and vomiting.  Arrangements for home on discharge were made.  [facility] would be coming to his house for INR blood draws and physical therapy, weightbearing  as tolerated.  CONDITION ON DISCHARGE:  Improved.  FOLLOWUP:  He will follow up with his GI doctors and the neurologist per their request.  He will see Dr. Jerl Santos at April 27 or later which will be 2 weeks from the surgery date.  He will continue Coumadin with an INR between 2 and 3 until April 26 when he can discontinue it and maybe weightbearing as tolerated.  He will have physical therapy also through [facility] for motion and gait training and weightbearing as tolerated.  Any sign of infection to call [phone] our office number. His medicines can be found on the discharge med management sheet and I hope that the [facility] will assist [place] in filling this out.     Lindwood Qua, P.A.   ______________________________ Lubertha Basque. Jerl Santos, M.D.    MC/MEDQ  D:  12/03/2010  T:  12/03/2010  Job:  [id number]  Electronically Signed by Lindwood Qua P.A. on 12/20/2010 03:14:23 PM Electronically Signed by Marcene Corning M.D. on 12/21/2010 08:27:07 AM

## 2010-12-11 NOTE — Op Note (Signed)
Cristian Collins, Cristian Collins              ACCOUNT NO.:  [id number]  MEDICAL RECORD NO.:  [id number]           PATIENT TYPE:  I  LOCATION:  3701                         FACILITY:  [facility]  PHYSICIAN:  Lubertha Basque. Rainbow Salman, M.D.DATE OF BIRTH:  October 25, 1929  DATE OF PROCEDURE:  11/22/2010 DATE OF DISCHARGE:                              OPERATIVE REPORT   PREOPERATIVE DIAGNOSIS:  Right knee degenerative joint disease.  POSTOPERATIVE DIAGNOSIS:  Right knee degenerative joint disease.  PROCEDURE:  Right total knee replacement.  ANESTHESIA:  General and block.  ATTENDING SURGEON:  Lubertha Basque. Anacarolina Evelyn, MD   INDICATIONS FOR PROCEDURE:  The patient is a 77 year old man with a very long history of painful right knee.  This has become resistant to conservative measures.  He has pain which limits his ability to walk and rest and he is offered a knee replacement.  Informed operative consent was obtained after discussion of possible complications including reaction to anesthesia, infection, DVT, PE, and death.  The importance of the postoperative rehabilitation protocol to optimize result was stressed extensively with the patient.  SUMMARY/FINDINGS AND PROCEDURE:  Under general anesthesia and a block, a right knee replacement was performed.  He had advanced degenerative change lateral and patellofemoral with moderate valgus deformity.  We addressed this problem with a cemented DePuy LCS system using a standard plus femur, 5 tibia, 10 deep dish spacer, and 38 all polyethylene patella.  I did include Zinacef antibiotic in the cement.  DESCRIPTION OF PROCEDURE:  The patient was taken to the operating suite where general anesthetic was applied without difficulty.  He also was given a block in the preanesthesia area.  He was positioned supine and prepped and draped in normal sterile fashion.  After the insertion of IV Kefzol, the right leg was elevated, exsanguinated, and tourniquet inflated about the  thigh.  A longitudinal anterior incision was made with dissection down grade paucity of adipose tissue to the extensor mechanism.  All appropriate antiinfective measures were used including closed hooded exhaust systems for each member of the surgical team, Betadine impregnated drape, and the preoperative IV antibiotic.  A medial parapatellar incision was made.  The kneecap was flipped and the knee flexed.  Some residual meniscal tissues were removed along with the ACL and PCL on most of the fat pad.  Findings were as noted above.  An extramedullary guide was placed in the tibia to make a cut with a slight posterior tilt.  An intramedullary guide was then placed in the femur to make anterior-posterior cuts creating a flexion gap of 10 mm.  A second intramedullary guide was placed in the femur to make a distal cut creating an equal extension gap of 10 mm balancing the knee and correcting the valgus deformity.  The femur sized to a standard plus and the tibia to a 5 with the appropriate guides placed and utilized.  The patella was cut down thickness by about 10 mm to 15 and sized to a 38 with the appropriate guide placed and utilized.  A trial reduction was done with these components in the tenth spacer.  Easily came to slight hyperextension  and flexed well.  The patella tracked well.  The trial components were removed followed by pulsatile lavage irrigation of all three cut bony surfaces.  Cement was mixed including the Zinacef antibiotic and was pressurized onto the bones followed by placement of the aforementioned DePuy LCS components.  Excess cement was trimmed and pressure was held on the components until the cement hardened.  The tourniquet was deflated and a small amount of bleeding was easily controlled with Bovie cautery and some pressure.  The knee was irrigated followed by placement of a drain exiting superolaterally.  The extensor mechanism was reapproximated with #1 Vicryl in an  interrupted fashion followed by subcutaneous reapproximation with 0 and 2-0 undyed Vicryl. Skin was closed with staples.  Adaptic was applied followed by dry gauze and loose Ace wrap.  Estimated blood loss and intraoperative fluids can be obtained from anesthesia records as can accurate tourniquet time.  DISPOSITION:  The patient was extubated in the operating room and taken to the recovery room in stable addition.  He was to be admitted to the Orthopedic Surgery Service for appropriate postop care to include perioperative antibiotics and Coumadin plus Lovenox for DVT prophylaxis.     Lubertha Basque Jerl Santos, M.D.     PGD/MEDQ  D:  11/22/2010  T:  11/23/2010  Job:  [id number]  Electronically Signed by Marcene Corning M.D. on 12/11/2010 02:20:20 PM

## 2010-12-13 NOTE — Consult Note (Signed)
Cristian Collins, Cristian Collins NO.:  [id number]  MEDICAL RECORD NO.:  [id number]           PATIENT TYPE:  LOCATION:                                 FACILITY:  PHYSICIAN:  Heloise Purpura, MD      DATE OF BIRTH:  1929-10-11  DATE OF CONSULTATION:  11/28/2010 DATE OF DISCHARGE:                                CONSULTATION   REASON FOR CONSULTATION:  Urinary retention and bilateral hydronephrosis.  PHYSICIAN REQUESTING CONSULTATION:  Lubertha Basque. Jerl Santos, MD  HISTORY:  Mr. Grimsley is a 77 year old gentleman with a history of prostate cancer status post a radical retropubic prostatectomy in 2000 by Dr. Su Grand.  He has been followed by Dr. Brunilda Payor, although he has not been seen since 2008.  He apparently has longstanding stress incontinence, but has had no increased difficulty voiding or difficulty emptying his bladder prior to hospitalization.  He underwent a total knee arthroplasty on November 22, 2010 and did develop difficulty voiding postoperatively.  He also developed epigastric pain and nausea and has undergone an evaluation by [facility] including a CT scan of the abdomen and pelvis with contrast.  This incidentally demonstrated a very full bladder with bilateral hydroureteronephrosis, likely related to bladder outlet obstruction.  His serum creatinine has been normal at 1.01.  A 14-French Foley catheter was apparently place without difficulty with return of greater than 1 liter of grossly clear urine.  PAST MEDICAL HISTORY: 1. COPD. 2. Hyperlipidemia. 3. Hypertension. 4. History of prostate cancer. 5. History of peptic ulcer disease.  PAST SURGICAL HISTORY: 1. Right total knee arthroplasty. 2. Radical retropubic prostatectomy. 3. Partial gastrectomy.  CURRENT MEDICATIONS: 1. Norvasc. 2. Aspirin. 3. Colace. 4. Ferrous sulfate. 5. Fluconazole. 6. Advair. 7. Prinivil. 8. Bactroban 9.  Benicar. 10.Protonix. 11.K-Dur. 12.Senokot. 13.Spiriva. 14.Coumadin. 15.Tylenol. 16.Albuterol. 17.Reglan. 18.Nitrostat. 19.Percocet.  ALLERGIES:  No known drug allergies.  FAMILY HISTORY:  Maternal history of breast cancer.  SOCIAL HISTORY:  The patient lives with his wife.  He does have a history of tobacco use, although quit smoking over 30 years ago and denies alcohol or illicit drug use.  REVIEW OF SYSTEMS:  As per the history present illness.  All other systems are reviewed and are otherwise negative.  PHYSICAL EXAMINATION:  VITAL SIGNS:  Temperature 97.6, pulse 70, respirations 18, blood pressure 129/62. CONSTITUTIONAL:  Well-nourished, well-developed age-appropriate male in no acute distress. HEENT:  Normocephalic, atraumatic. NECK:  Supple without JVD. CARDIOVASCULAR:  Regular rate and rhythm. LUNGS:  Normal respiratory effort. ABDOMEN:  He does have mild tenderness in his right costovertebral angle.  No abdominal tenderness or masses.  No rebound tenderness or guarding. GU:  He has an indwelling Foley catheter, draining grossly clear urine. EXTREMITIES:  No edema. NEUROLOGIC:  Grossly intact.  LABORATORY DATA:  Serum creatinine 1.01, sodium 128, potassium 2.4. White blood count 6.3, hemoglobin 8.0.  RADIOLOGIC IMAGING:  I independently reviewed his CT scan of the abdomen and pelvis with contrast.  Findings are as dictated above and indicate bilateral hydroureteronephrosis and a Foley distended bladder, likely consistent with bladder outlet obstruction.  IMPRESSION: 1. Postoperative urinary retention. 2. Bilateral  hydroureteronephrosis.  RECOMMENDATIONS:  His Foley catheter should be left indwelling at this time.  I would recommend that he undergo a repeat renal ultrasound in 48 hours to document resolution of his hydronephrosis, which likely is related to his inability to void.  The most likely etiology of his postoperative retention would be related to  postoperative medications considering that he has undergone a prior prostatectomy and did not have any difficulty with catheter placement.  I will notify Dr. Brunilda Payor of his situation, so they can further follow the patient during his hospitalization.  He will then need to undergo voiding trial either as an inpatient or subsequently as an outpatient pending Dr. Madilyn Hook recommendation.     Heloise Purpura, MD     LB/MEDQ  D:  11/28/2010  T:  11/29/2010  Job:  [id number]  cc:   Lindaann Slough, M.D. Lubertha Basque Jerl Santos, M.D.  Electronically Signed by Heloise Purpura MD on 12/13/2010 05:28:35 PM
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
## 2010-12-28 NOTE — Discharge Summary (Signed)
NAMEANUP, BRIGHAM              ACCOUNT NO.:  [id number]   MEDICAL RECORD NO.:  [id number]          PATIENT TYPE:  INP   LOCATION:  1305                         FACILITY:  [facility]   PHYSICIAN:  Isidor Holts, M.D.  DATE OF BIRTH:  04-29-1930   DATE OF ADMISSION:  08/14/2006  DATE OF DISCHARGE:  08/24/2006                               DISCHARGE SUMMARY   ADDENDUM:   PMD:  Dr. Billee Cashing.   DISCHARGE DIAGNOSES:  Refer to Interim Summary dated August 20, 2006.   PROCEDURES:  Refer to above-mentioned Interim Summary.  In addition,  abdominal/pelvic CT scan dated August 22, 2006, showed calcified  pleural plaque right base, no evidence of bowel obstruction, no acute  pelvic CT findings.   DISCHARGE MEDICATIONS:  1. Amlodipine 10 mg p.o. daily.  2. Singulair 10 mg p.o. daily.  3. Mucinex 600 mg p.o. b.i.d. for 1 week only.  4. Avelox 400 mg p.o. daily for 4 days.  5. Prednisone 20 mg p.o. daily until August 25, 2006, then 10 mg p.o.      daily from August 26, 2006, to August 28, 2006, then stop.  6. Lisinopril 20 mg p.o. daily.  7. Protonix 40 mg p.o. daily.  8. Flovent (110 mcg) two puffs b.i.d.  9. Albuterol 2.5 mg/Atrovent 500 mcg nebulizers p.r.n. q.4-6 hourly.  10.Vitamin B12 injections 1 mg IM q.monthly from September 12, 2006,      primary M.D. to supervise.   ADMISSION HISTORY AND DETAILED CLINICAL COURSE:  Refer to Interim  Summary dated August 20, 2006.  In the subsequent period from August 21, 2006, to August 24, 2006, patient continued to improve clinically.  He did have nonspecific abdominal pain which appeared to respond to  proton pump inhibitor treatment.  However, abdominal/pelvic CT scan was  ordered on August 22, 2006, and this basically was unremarkable.  Patient has been reassured accordingly, and as of August 24, 2006, was  quite asymptomatic.  His blood pressure has been controlled with a  combination of Norvasc and Lisinopril, and his  respiratory status has  gradually improved.  Clinically, it appears that patient has end-stage  COPD and he seemed a suitable candidate for home oxygen therapy.  Fortunately, he was able to wean off oxygen on August 23, 2006, and  throughout that day as well as on August 24, 2006, he was maintaining  adequate saturations on room air.  By a.m. of August 24, 2006, there  were no new issues, patient was keen to be discharged and he was deemed  sufficiently clinically stable and recovered to be discharged on home  nebulizers, as originally planned.  He has, therefore, been discharged  accordingly.   ACTIVITY:  As tolerated.   DIET:  Heart healthy diet.   WOUND CARE:  As applicable.   FOLLOWUP INSTRUCTIONS:  Patient is recommended to follow up with his  primary M.D., Dr. Billee Cashing, within 1 week of discharge.  He has  been instructed to call for an appointment.   Note, Azmacort and triamterene/HCTZ have been discontinued.      Isidor Holts, M.D.  Electronically Signed     CO/MEDQ  D:  08/24/2006  T:  08/24/2006  Job:  [id number]   cc:   Lorelle Formosa, M.D.  Fax: [phone]

## 2010-12-28 NOTE — Discharge Summary (Signed)
Cristian Collins, Cristian Collins NO.:  [id number]   MEDICAL RECORD NO.:  [id number]          PATIENT TYPE:  INP   LOCATION:  1305                         FACILITY:  [facility]   PHYSICIAN:  Isidor Holts, M.D.  DATE OF BIRTH:  07/12/1930   DATE OF ADMISSION:  08/14/2006  DATE OF DISCHARGE:                               DISCHARGE SUMMARY   DISCHARGE DIAGNOSES:  1. Infective exacerbation of chronic obstructive pulmonary disease.  2. Hypertension.  3. History of prostate cancer.  4. Remote history of peptic ulcer disease, status post partial      gastrectomy.  5. Nonspecific abdominal pain/constipation.  6. Gastroesophageal reflux disease.  7. Normocytic anemia, with borderline low B12 level.   DISCHARGE MEDICATIONS:  To be listed in addendum at the time of actual  discharge by discharging MD.   PROCEDURES:  1. Two view chest x-ray dated August 13, 2006.  This showed      hyperinflation with chronic parenchymal changes.  No acute disease.  2. Two view chest x-ray dated August 16, 2006.  This showed      COPD/emphysema.  No acute cardiopulmonary disease.  3. Abdominal x-ray dated August 16, 2006.  This showed normal bowel      gas pattern.  No acute abdominal abnormalities.  4. Portable chest x-ray dated August 19, 2006.  This showed COPD,      right pleural calcification.  No active disease.   CONSULTATIONS:  None.   ADMISSION HISTORY:  As in HPI notes of August 14, 2006. However, in  brief, this is a 77 year old male, with known history of COPD,  hypertension, prostate cancer status post surgery, peptic ulcer disease,  status post partial gastrectomy in the 1960s, cataracts, who presents  with a three week history of increasing shortness of breath associated  with chest tightness as well as cough productive of yellow/brownish  phlegm. He was seen in the emergency department at [facility] on August 13, 2006, was treated with bronchodilator  nebulizers  and discharged with a prescription for antibiotics, which he did not  have time to fill, before he represented the next day because of  continued symptomatology.  He was admitted for further evaluation,  investigation, and management.   CLINICAL COURSE:  1. Infective exacerbation of COPD:  For details of presentation, refer      to admission history above.  Patient was managed with steroids,      bronchodilator nebulizers, Mucinex, and Avelox with satisfactory      but slow clinical response.  By August 16, 2006, we were able to      transition him from intravenous steroids to oral prednisone in a      tapering course, and patient continued to improve.   1. Hypertension:  Patient's blood pressure continued to remain mildly      elevated during the course of the hospitalization.  This has been      addressed with a combination of Norvasc and ACE inhibitor      treatment, titrated according to blood pressure response.   1. Abdominal pain/constipation:  On  August 16, 2006, the patient      complained of left upper quadrant abdominal pain off and on,      relieved, however, by analgesics, which had apparently commenced      the night before.  Physical examination was really quite      unremarkable, although there was mild tenderness in the left upper      quadrant.  Abdominal x-ray was unremarkable.  It was deemed that      this was likely secondary to musculoskeletal pain, against a      background of increased work of breathing.  He was managed with      simple analgesics.  On the suspicion of possible GERD, a twice      daily proton pump inhibitor was added to treatment.  These measures      proved quite effective.  By August 19, 2006,  the patient was      asymptomatic from this viewpoint.  Proton pump inhibitor has      therefore been changed, once again, to once daily.  Analgesics have      been discontinued.  He has been placed on twice daily Colace for       constipation.   1. History of prostate cancer:  There have been no problems referable,      to this during the course of this hospitalization.   1. Normocytic anemia:  Patient was noted at the time of initial      evaluation to have a mild normocytic anemia.  Hemoglobin on August 19, 2006 was 11.2 with a hematocrit of 33.3.  MCV was 87.  Iron      studies and hematinic workup showed the following findings:  Iron      163, TIBC 376, percentage saturation 43, ferritin 41.  B12 255,      folate 16.3.  It is clear that the patient's vitamin B12 level is      borderline. Against a background of known history of partial      gastrectomy for peptic ulcer disease, it is felt appropriate to      commence the patient on vitamin B12 supplementation.  This was been      commenced accordingly, on August 20, 2006.   DISPOSITION:  This will be elucidated in detail in an addendum at the  time of actual discharge, by discharging MD; however, it is anticipated  that over the next few days, the patient will become sufficiently  clinically recovered and stable to be discharged.  It is clear also that  the patient will require home nebulizer machine and treatment, if he is  to avoid frequent hospitalizations in the future.  Arrangements will be  made for him to obtain this, prior to discharge.      Isidor Holts, M.D.  Electronically Signed     CO/MEDQ  D:  08/20/2006  T:  08/20/2006  Job:  [id number]   cc:   Lorelle Formosa, M.D.  Fax: [phone]

## 2011-01-14 ENCOUNTER — Ambulatory Visit: Payer: Medicare Other | Admitting: Internal Medicine

## 2012-04-06 ENCOUNTER — Encounter: Payer: Self-pay | Admitting: Internal Medicine

## 2012-04-07 ENCOUNTER — Ambulatory Visit (INDEPENDENT_AMBULATORY_CARE_PROVIDER_SITE_OTHER): Payer: Medicare Other | Admitting: Internal Medicine

## 2012-04-07 ENCOUNTER — Ambulatory Visit (INDEPENDENT_AMBULATORY_CARE_PROVIDER_SITE_OTHER)
Admission: RE | Admit: 2012-04-07 | Discharge: 2012-04-07 | Disposition: A | Discharge status: Home / Self Care | Payer: Medicare Other | Source: Ambulatory Visit | Attending: Internal Medicine | Admitting: Internal Medicine

## 2012-04-07 ENCOUNTER — Encounter: Payer: Self-pay | Admitting: Internal Medicine

## 2012-04-07 VITALS — BP 148/78 | HR 78 | Temp 97.9°F | Ht 64.0 in | Wt 103.8 lb

## 2012-04-07 DIAGNOSIS — J4489 Other specified chronic obstructive pulmonary disease: Secondary | ICD-10-CM

## 2012-04-07 DIAGNOSIS — J449 Chronic obstructive pulmonary disease, unspecified: Secondary | ICD-10-CM | POA: Insufficient documentation

## 2012-04-07 MED ORDER — BUDESONIDE-FORMOTEROL FUMARATE 160-4.5 MCG/ACT IN AERO: INHALATION_SPRAY | RESPIRATORY_TRACT | Status: DC

## 2012-04-07 NOTE — Assessment & Plan Note (Signed)
Clinically moderate copd with asthmatic component suggested by reported response to saba  DDX of  difficult airways managment all start with A and  include Adherence, Ace Inhibitors, Acid Reflux, Active Sinus Disease, Alpha 1 Antitripsin deficiency, Anxiety masquerading as Airways dz,  ABPA,  allergy(esp in young), Aspiration (esp in elderly), Adverse effects of DPI,  Active smokers, plus two Bs  = Bronchiectasis and Beta blocker use..and one C= CHF  Adherence is always the initial "prime suspect" and is a multilayered concern that requires a "trust but verify" approach in every patient - starting with knowing how to use medications, especially inhalers, correctly, keeping up with refills and understanding the fundamental difference between maintenance and prns vs those medications only taken for a very short course and then stopped and not refilled.   The proper method of use, as well as anticipated side effects, of a metered-dose inhaler are discussed and demonstrated to the patient. Improved effectiveness after extensive coaching during this visit to a level of approximately  50% but not better > try symbicort 160 2 bid  ? Adverse effect of advair > try off then return for pfts

## 2012-04-07 NOTE — Patient Instructions (Addendum)
Symbicort 160 Take 2 puffs first thing in am and then another 2 puffs about 12 hours later.   Stop advair  Stay on spiriva  Work on inhaler technique:  relax and gently blow all the way out then take a nice smooth deep breath back in, triggering the inhaler at same time you start breathing in.  Hold for up to 5 seconds if you can.  Rinse and gargle with water when done   If your mouth or throat starts to bother you,   I suggest you time the inhaler to your dental care and after using the inhaler(s) brush teeth and tongue with a baking soda containing toothpaste and when you rinse this out, gargle with it first to see if this helps your mouth and throat.     Please remember to go to the x-ray department downstairs for your tests - we will call you with the results when they are available.  Please schedule a follow up office visit in 4 weeks, sooner if needed with pft's

## 2012-04-07 NOTE — Progress Notes (Signed)
  Subjective:    Patient ID: Cristian Collins, male    DOB: [birth year] MRN: [id number]  HPI   49 yobm quit smoking 1970's due to cough/ sob got some better then started getting worse again around 2009 referred by Dr Kellie Shropshire 04/07/2012 to pulmonary clinic for sob x 6 months despite max inhaler rx.  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  No unusual cough,  purulent sputum or sinus/hb symptoms on present rx.  No obvious daytime variabilty or assoc   cp or chest tightness, subjective wheeze . No unusual exp hx or h/o childhood pna/ asthma or premature birth to his knowledge.   Sleeping ok without nocturnal  or early am exacerbation  of respiratory  c/o's or need for noct saba. Also denies any obvious fluctuation of symptoms with weather or environmental changes or other aggravating or alleviating factors except as outlined above       Review of Systems  Constitutional: Positive for appetite change and unexpected weight change. Negative for fever, chills and activity change.  HENT: Negative for congestion, sore throat, rhinorrhea, sneezing, trouble swallowing, dental problem, voice change and postnasal drip.   Eyes: Negative for visual disturbance.  Respiratory: Positive for cough and shortness of breath. Negative for choking.   Cardiovascular: Positive for chest pain. Negative for leg swelling.  Gastrointestinal: Negative for nausea, vomiting and abdominal pain.  Genitourinary: Negative for difficulty urinating.  Musculoskeletal: Positive for arthralgias.  Skin: Negative for rash.  Psychiatric/Behavioral: Negative for behavioral problems and confusion.       Objective:   Physical Exam  Wt Readings from Last 3 Encounters:  04/07/12 103 lb 12.8 oz (47.083 kg)   edentulous thin bm  nad HEENT mild turbinate edema.  Oropharynx no thrush or excess pnd or cobblestoning.  No JVD or cervical adenopathy. Mild accessory muscle hypertrophy. Trachea midline, nl thryroid. Chest was hyperinflated by percussion with diminished breath sounds and moderate increased exp time without wheeze. Hoover sign positive at mid inspiration. Regular rate and rhythm without murmur gallop or rub or increase P2 or edema.  Abd: no hsm, nl excursion. Ext warm without cyanosis or clubbing.    CXR  04/07/2012 : Emphysema. No acute findings identified.         Assessment & Plan:

## 2012-05-21 ENCOUNTER — Ambulatory Visit (INDEPENDENT_AMBULATORY_CARE_PROVIDER_SITE_OTHER): Payer: Medicare Other | Admitting: Internal Medicine

## 2012-05-21 ENCOUNTER — Encounter: Payer: Self-pay | Admitting: Internal Medicine

## 2012-05-21 VITALS — BP 120/58 | HR 60 | Temp 96.9°F | Ht 65.0 in | Wt 106.0 lb

## 2012-05-21 DIAGNOSIS — J4489 Other specified chronic obstructive pulmonary disease: Secondary | ICD-10-CM

## 2012-05-21 DIAGNOSIS — J449 Chronic obstructive pulmonary disease, unspecified: Secondary | ICD-10-CM

## 2012-05-21 DIAGNOSIS — Z23 Encounter for immunization: Secondary | ICD-10-CM

## 2012-05-21 LAB — PULMONARY FUNCTION TEST

## 2012-05-21 NOTE — Progress Notes (Signed)
PFT done today. 

## 2012-05-21 NOTE — Assessment & Plan Note (Addendum)
-   hfa 50% 04/07/2012 > 75% 05/21/2012    - PFTs 05/21/2012 0.69 (32) ratio 41 and 34% better p B2  GOLD III but very significant reversible component > continue symbicort and spiriva  The proper method of use, as well as anticipated side effects, of a metered-dose inhaler are discussed and demonstrated to the patient. Improved effectiveness after extensive coaching during this visit to a level of approximately  75%    Each maintenance medication was reviewed in detail including most importantly the difference between maintenance and as needed and under what circumstances the prns are to be used.  Please see instructions for details which were reviewed in writing and the patient given a copy.

## 2012-05-21 NOTE — Progress Notes (Signed)
  Subjective:    Patient ID: Cristian Collins, male    DOB: [birth year] MRN: [id number]  HPI   9 yobm quit smoking 1970's due to cough/ sob got some better then started getting worse again around 2009 referred by Dr Cristian Collins 04/07/2012 to pulmonary clinic for sob x 6 months despite max inhaler rx.  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   rec Symbicort 160 Take 2 puffs first thing in am and then another 2 puffs about 12 hours later.  Stop advair Stay on spiriva Work on inhaler technique: . Please schedule a follow up office visit in 4 weeks, sooner if needed with pft's   05/21/2012 f/u ov/Cristian Collins cc breathing better but still c/o doe eg  limiting from walking the dogs more than 15-20 min s stopping, very slow pace.  No obvious daytime variabilty or assoc chronic cough or cp or chest tightness, subjective wheeze overt sinus or hb symptoms. No unusual exp hx or h/o childhood pna/ asthma or premature birth to his knowledge.   Sleeping ok without nocturnal  or early am exacerbation  of respiratory  c/o's or need for noct saba. Also denies any obvious fluctuation of symptoms with weather or environmental changes or other aggravating or alleviating factors except as outlined above.  ROS  The following are not active complaints unless bolded sore throat, dysphagia, dental problems, itching, sneezing,  nasal congestion or excess/ purulent secretions, ear ache,   fever, chills, sweats, unintended wt loss, pleuritic or exertional cp, hemoptysis,  orthopnea pnd or leg swelling, presyncope, palpitations, heartburn, abdominal pain, anorexia, nausea, vomiting, diarrhea  or change in bowel or urinary habits, change in stools or urine, dysuria,hematuria,  rash, arthralgias, visual complaints, headache, numbness  weakness or ataxia or problems with walking or coordination,  change in mood/affect or memory.               Objective:   Physical Exam Wt 106 05/21/2012  Wt Readings from Last 3 Encounters:  04/07/12 103 lb 12.8 oz (47.083 kg)   edentulous thin bm nad HEENT mild turbinate edema.  Oropharynx no thrush or excess pnd or cobblestoning.  No JVD or cervical adenopathy. Mild accessory muscle hypertrophy. Trachea midline, nl thryroid. Chest was hyperinflated by percussion with diminished breath sounds and moderate increased exp time without wheeze. Hoover sign positive at mid inspiration. Regular rate and rhythm without murmur gallop or rub or increase P2 or edema.  Abd: no hsm, nl excursion. Ext warm without cyanosis or clubbing.    CXR  04/07/2012 : Emphysema. No acute findings identified.         Assessment & Plan:

## 2012-05-21 NOTE — Patient Instructions (Addendum)
Work on inhaler technique:  relax and gently blow all the way out then take a nice smooth deep breath back in, triggering the inhaler at same time you start breathing in.  Hold for up to 5 seconds if you can.  Rinse and gargle with water when done   If your mouth or throat starts to bother you,   I suggest you time the inhaler to your dental care and after using the inhaler(s) brush teeth and tongue with a baking soda containing toothpaste and when you rinse this out, gargle with it first to see if this helps your mouth and throat.     If you are satisfied with your treatment plan let your doctor know and he/she can either refill your medications or you can return here when your prescription runs out.     If in any way you are not 100% satisfied,  please tell us.  If 100% better, tell your friends!  

## 2012-07-12 HISTORY — PX: CARDIAC CATHETERIZATION: SHX172

## 2012-07-21 ENCOUNTER — Encounter (HOSPITAL_COMMUNITY): Payer: Self-pay | Admitting: Pharmacy Technician

## 2012-07-28 ENCOUNTER — Ambulatory Visit (HOSPITAL_COMMUNITY)
Admission: RE | Admit: 2012-07-28 | Discharge: 2012-07-28 | Disposition: A | Discharge status: Home / Self Care | Payer: Medicare Other | Source: Ambulatory Visit | Attending: Cardiology | Admitting: Cardiology

## 2012-07-28 ENCOUNTER — Encounter (HOSPITAL_COMMUNITY)
Admission: RE | Disposition: A | Discharge status: Home / Self Care | Payer: Self-pay | Source: Ambulatory Visit | Attending: Cardiology

## 2012-07-28 DIAGNOSIS — E785 Hyperlipidemia, unspecified: Secondary | ICD-10-CM | POA: Insufficient documentation

## 2012-07-28 DIAGNOSIS — I2510 Atherosclerotic heart disease of native coronary artery without angina pectoris: Secondary | ICD-10-CM | POA: Insufficient documentation

## 2012-07-28 DIAGNOSIS — I10 Essential (primary) hypertension: Secondary | ICD-10-CM | POA: Insufficient documentation

## 2012-07-28 HISTORY — PX: LEFT HEART CATHETERIZATION WITH CORONARY ANGIOGRAM: SHX5451

## 2012-07-28 SURGERY — LEFT HEART CATHETERIZATION WITH CORONARY ANGIOGRAM
Anesthesia: LOCAL

## 2012-07-28 MED ORDER — VERAPAMIL HCL 2.5 MG/ML IV SOLN
INTRAVENOUS | Status: AC
  Filled 2012-07-28: qty 2

## 2012-07-28 MED ORDER — METOPROLOL SUCCINATE ER 25 MG PO TB24: 25.0000 mg | ORAL_TABLET | Freq: Every day | ORAL | Status: DC

## 2012-07-28 MED ORDER — MIDAZOLAM HCL 2 MG/2ML IJ SOLN
INTRAMUSCULAR | Status: AC
  Filled 2012-07-28: qty 2

## 2012-07-28 MED ORDER — NITROGLYCERIN 0.2 MG/ML ON CALL CATH LAB
INTRAVENOUS | Status: AC
  Filled 2012-07-28: qty 1

## 2012-07-28 MED ORDER — HEPARIN (PORCINE) IN NACL 2-0.9 UNIT/ML-% IJ SOLN
INTRAMUSCULAR | Status: AC
  Filled 2012-07-28: qty 1000

## 2012-07-28 MED ORDER — ONDANSETRON HCL 4 MG/2ML IJ SOLN: 4.0000 mg | Freq: Four times a day (QID) | INTRAMUSCULAR | Status: DC | PRN

## 2012-07-28 MED ORDER — ASPIRIN 81 MG PO CHEW
324.0000 mg | CHEWABLE_TABLET | ORAL | Status: AC
  Administered 2012-07-28: 324 mg via ORAL
  Filled 2012-07-28: qty 4

## 2012-07-28 MED ORDER — HYDROMORPHONE HCL PF 2 MG/ML IJ SOLN
INTRAMUSCULAR | Status: AC
  Filled 2012-07-28: qty 1

## 2012-07-28 MED ORDER — METOPROLOL SUCCINATE ER 25 MG PO TB24
25.0000 mg | ORAL_TABLET | Freq: Every day | ORAL | Status: DC
  Administered 2012-07-28: 25 mg via ORAL
  Filled 2012-07-28 (×3): qty 1

## 2012-07-28 MED ORDER — LIDOCAINE HCL (PF) 1 % IJ SOLN
INTRAMUSCULAR | Status: AC
  Filled 2012-07-28: qty 30

## 2012-07-28 MED ORDER — SODIUM CHLORIDE 0.9 % IJ SOLN: 3.0000 mL | INTRAMUSCULAR | Status: DC | PRN

## 2012-07-28 MED ORDER — SODIUM CHLORIDE 0.9 % IV SOLN: 1.0000 mL/kg/h | INTRAVENOUS | Status: DC

## 2012-07-28 MED ORDER — SODIUM CHLORIDE 0.9 % IV SOLN: 250.0000 mL | INTRAVENOUS | Status: DC | PRN

## 2012-07-28 MED ORDER — ACETAMINOPHEN 325 MG PO TABS: 650.0000 mg | ORAL_TABLET | ORAL | Status: DC | PRN

## 2012-07-28 MED ORDER — SODIUM CHLORIDE 0.9 % IV SOLN
INTRAVENOUS | Status: DC
  Administered 2012-07-28: 07:00:00 via INTRAVENOUS

## 2012-07-28 MED ORDER — SODIUM CHLORIDE 0.9 % IJ SOLN
3.0000 mL | Freq: Two times a day (BID) | INTRAMUSCULAR | Status: DC
Start: 2012-07-28 — End: 2012-07-28

## 2012-07-28 NOTE — Interval H&P Note (Signed)
History and Physical Interval Note:  07/28/2012 7:13 AM  Cristian Collins  has presented today for surgery, with the diagnosis of cp  The various methods of treatment have been discussed with the patient and family. After consideration of risks, benefits and other options for treatment, the patient has consented to  Procedure(s) (LRB) with comments: LEFT HEART CATHETERIZATION WITH CORONARY ANGIOGRAM (N/A) and possible angioplasty as a surgical intervention .  The patient's history has been reviewed, patient examined, no change in status, stable for surgery.  I have reviewed the patient's chart and labs.  Questions were answered to the patient's satisfaction.     Pamella Pert

## 2012-07-28 NOTE — CV Procedure (Signed)
Procedure performed:  Left heart catheterization including hemodynamic monitoring of the left ventricle, LV gram, selective right and left coronary arteriography.  Indication patient is a 78 year-old male with history of hypertension,  hyperlipidemia, mild PAD   who presents with nitrate responsive chest discomfort. Patient has  had non invasive testing which was normal.  Do to ongoing chest discomfort  brought to the cardiac catheterization lab to evaluate the  coronary anatomy for definitive diagnosis of CAD.  Hemodynamic data:  Left ventricular pressure was 108/2 with LVEDP of 6 mm mercury. Aortic pressure was 103/55 with a mean of 73 mm mercury. There was no pressure gradient across the aortic valve  Left ventricle: Performed in the RAO projection revealed LVEF of 70%. There was No MR. No wall motion abnormality.  Right coronary artery: The vessel is smooth, Dominant with mild diffuse luminal irregularity and mild diffuse calcification.  Left main coronary artery is large and has mild coronary calcifications without luminal irregularity.  Circumflex coronary artery: A large vessel giving origin to a large obtuse marginal 1 and a moderate sized OM 2. OM1 has 30-40% diffuse luminal irregularity in the proximal segment. Mild calcification is also evident. OM 2 has a 70% stenosis after the origin of a AV groove branch. In some views the stenosis appeared to be 80%. It was mildly hazy. Mild calcification evident.  LAD:  LAD gives origin to a large diagonal-1.  LAD has diffuse mild luminal irregularities. D1 has proximal 40-50% stenosis with mild ectasia.  Technique: Under sterile precautions using a 6 French right radial  arterial access, a 6 French sheath was introduced into the right radial artery. A 5 [place] Tig 4 catheter was advanced into the ascending aorta selective  right coronary artery and left coronary artery was cannulated and angiography was performed in multiple views. The catheter  was pulled back Out of the body over exchange length J-wire.   Same Catheter was used to perform LV gram which was performed in RAO projection.  Catheter exchanged out of the body over J-Wire. NO immediate complications noted. Patient tolerated the procedure well.   Rec: Medical therapy with aggressive risk factor reduction. The circumflex OM 2 appears to be significant, however due to anemia, his age, size of the vessel I felt we can treat him medically. Only for lifestyle limiting angina pectoris, I would consider angioplasty.  Disposition: Will be discharged home today with outpatient follow up.

## 2012-07-28 NOTE — Progress Notes (Signed)
C/O TINGLING LEFT CHEST AND DR Jacinto Halim NOTIFIED AND NO NEW ORDERS NOTED

## 2012-07-28 NOTE — H&P (Signed)
  Please see paper chart  

## 2012-08-22 ENCOUNTER — Other Ambulatory Visit: Payer: Self-pay

## 2012-08-22 ENCOUNTER — Emergency Department (HOSPITAL_COMMUNITY)
Admission: EM | Admit: 2012-08-22 | Discharge: 2012-08-22 | Disposition: A | Discharge status: Home / Self Care | Payer: Medicare Other | Attending: Emergency Medicine | Admitting: Emergency Medicine

## 2012-08-22 ENCOUNTER — Emergency Department (HOSPITAL_COMMUNITY): Payer: Medicare Other

## 2012-08-22 DIAGNOSIS — Z7982 Long term (current) use of aspirin: Secondary | ICD-10-CM | POA: Insufficient documentation

## 2012-08-22 DIAGNOSIS — M1710 Unilateral primary osteoarthritis, unspecified knee: Secondary | ICD-10-CM | POA: Insufficient documentation

## 2012-08-22 DIAGNOSIS — E785 Hyperlipidemia, unspecified: Secondary | ICD-10-CM | POA: Insufficient documentation

## 2012-08-22 DIAGNOSIS — Z87891 Personal history of nicotine dependence: Secondary | ICD-10-CM | POA: Insufficient documentation

## 2012-08-22 DIAGNOSIS — I2510 Atherosclerotic heart disease of native coronary artery without angina pectoris: Secondary | ICD-10-CM | POA: Insufficient documentation

## 2012-08-22 DIAGNOSIS — Z8639 Personal history of other endocrine, nutritional and metabolic disease: Secondary | ICD-10-CM | POA: Insufficient documentation

## 2012-08-22 DIAGNOSIS — Z8701 Personal history of pneumonia (recurrent): Secondary | ICD-10-CM | POA: Insufficient documentation

## 2012-08-22 DIAGNOSIS — I10 Essential (primary) hypertension: Secondary | ICD-10-CM | POA: Insufficient documentation

## 2012-08-22 DIAGNOSIS — Z8546 Personal history of malignant neoplasm of prostate: Secondary | ICD-10-CM | POA: Insufficient documentation

## 2012-08-22 DIAGNOSIS — Z8719 Personal history of other diseases of the digestive system: Secondary | ICD-10-CM | POA: Insufficient documentation

## 2012-08-22 DIAGNOSIS — IMO0002 Reserved for concepts with insufficient information to code with codable children: Secondary | ICD-10-CM | POA: Insufficient documentation

## 2012-08-22 DIAGNOSIS — Z79899 Other long term (current) drug therapy: Secondary | ICD-10-CM | POA: Insufficient documentation

## 2012-08-22 DIAGNOSIS — J441 Chronic obstructive pulmonary disease with (acute) exacerbation: Secondary | ICD-10-CM | POA: Insufficient documentation

## 2012-08-22 DIAGNOSIS — Z8711 Personal history of peptic ulcer disease: Secondary | ICD-10-CM | POA: Insufficient documentation

## 2012-08-22 DIAGNOSIS — Z862 Personal history of diseases of the blood and blood-forming organs and certain disorders involving the immune mechanism: Secondary | ICD-10-CM | POA: Insufficient documentation

## 2012-08-22 DIAGNOSIS — Z87448 Personal history of other diseases of urinary system: Secondary | ICD-10-CM | POA: Insufficient documentation

## 2012-08-22 LAB — COMPREHENSIVE METABOLIC PANEL
ALT: 10 U/L (ref 0–53)
AST: 21 U/L (ref 0–37)
Albumin: 3.3 g/dL — ABNORMAL LOW (ref 3.5–5.2)
Alkaline Phosphatase: 80 U/L (ref 39–117)
CO2: 26 mEq/L (ref 19–32)
Chloride: 104 mEq/L (ref 96–112)
GFR calc non Af Amer: 59 mL/min — ABNORMAL LOW (ref >90)
Potassium: 4.1 mEq/L (ref 3.5–5.1)
Sodium: 140 mEq/L (ref 135–145)
Total Bilirubin: 0.2 mg/dL — ABNORMAL LOW (ref 0.3–1.2)

## 2012-08-22 LAB — CBC WITH DIFFERENTIAL/PLATELET
Basophils Absolute: 0 10*3/uL (ref 0.0–0.1)
Basophils Relative: 0 % (ref 0–1)
HCT: 32.1 % — ABNORMAL LOW (ref 39.0–52.0)
Lymphocytes Relative: 25 % (ref 12–46)
MCHC: 33.6 g/dL (ref 30.0–36.0)
Monocytes Absolute: 0.6 10*3/uL (ref 0.1–1.0)
Neutro Abs: 2.1 10*3/uL (ref 1.7–7.7)
Neutrophils Relative %: 54 % (ref 43–77)
RDW: 14.2 % (ref 11.5–15.5)
WBC: 3.8 10*3/uL — ABNORMAL LOW (ref 4.0–10.5)

## 2012-08-22 LAB — PRO B NATRIURETIC PEPTIDE: Pro B Natriuretic peptide (BNP): 553.4 pg/mL — ABNORMAL HIGH (ref 0–450)

## 2012-08-22 MED ORDER — PREDNISONE 20 MG PO TABS
60.0000 mg | ORAL_TABLET | Freq: Once | ORAL | Status: AC
  Administered 2012-08-22: 60 mg via ORAL
  Filled 2012-08-22: qty 3

## 2012-08-22 MED ORDER — ALBUTEROL SULFATE (5 MG/ML) 0.5% IN NEBU
2.5000 mg | INHALATION_SOLUTION | Freq: Once | RESPIRATORY_TRACT | Status: AC
  Administered 2012-08-22: 2.5 mg via RESPIRATORY_TRACT
  Filled 2012-08-22: qty 0.5

## 2012-08-22 MED ORDER — FENTANYL CITRATE 0.05 MG/ML IJ SOLN
50.0000 ug | Freq: Once | INTRAMUSCULAR | Status: AC
  Administered 2012-08-22: 50 ug via INTRAVENOUS
  Filled 2012-08-22: qty 2

## 2012-08-22 MED ORDER — IPRATROPIUM BROMIDE 0.02 % IN SOLN
0.5000 mg | Freq: Once | RESPIRATORY_TRACT | Status: AC
  Administered 2012-08-22: 0.5 mg via RESPIRATORY_TRACT
  Filled 2012-08-22: qty 2.5

## 2012-08-22 MED ORDER — PREDNISONE 10 MG PO TABS: 20.0000 mg | ORAL_TABLET | Freq: Every day | ORAL | Status: DC

## 2012-08-22 NOTE — Discharge Instructions (Signed)
Chronic Obstructive Pulmonary Disease Exacerbation  Chronic obstructive pulmonary disease (COPD) is a condition that limits airflow. COPD may include chronic bronchitis, pulmonary emphysema, or both. COPD exacerbation means that your COPD has gotten worse. Without treatment, this can be a life-threatening problem. COPD exacerbation requires immediate medical care.  CAUSES    COPD exacerbation can be caused by:   Exposure to smoke.   Exposure to air pollution, chemical fumes, or dust.   Respiratory infections.   Genetics, particularly alpha 1-antitrypsin deficiency.   A condition in which the body's immune system attacks itself (autoimmunity).  SYMPTOMS     Increased coughing.   Increased wheezing.   Increased shortness of breath.   Swelling due to a buildup of fluid (peripheral edema) related to heart strain.   Rapid breathing.   Chest enlargement (barrel chest).   Chest tightness.  DIAGNOSIS    There is no single test that can diagnosis COPD exacerbation. Your history, physical exam, and other tests will help your caregiver make a diagnosis. Tests may include a chest X-ray, pulmonary function tests, spirometry, basic lab tests, and an arterial blood gas test.  TREATMENT    Severe problems may require a stay in the hospital. Depending on the cause of your problems, the following may be prescribed:   Antibiotic medicines.   Bronchodilators (inhaled or tablets).   Cortisone medicines (inhaled or tablets).   Supplemental oxygen therapy.   Pulmonary rehabilitation. This is a broad program that may involve exercise, nutrition counseling, breathing techniques, and further education about your condition.  It is important to use good technique with inhaled medicines. Spacer devices may be needed to help improve drug delivery.  HOME CARE INSTRUCTIONS     Do not smoke. Quitting smoking is very important to prevent worsening of COPD.   Avoid exposure to all substances that irritate the airway, especially  tobacco smoke.   If prescribed, take your antibiotics as directed. Finish them even if you start to feel better.   Only take over-the-counter or prescription medicines as directed by your caregiver.   Drink enough fluids to keep your urine clear or pale yellow. This can help thin bronchial secretions.   Use a cool mist vaporizer. This makes it easier to clear your chest when you cough.   If you have a home nebulizer and oxygen, continue to use them as directed.   Maintain all necessary vaccinations to prevent infections.   Exercise regularly.   Eat a healthy diet.   Keep all follow-up appointments as directed by your caregiver.  SEEK IMMEDIATE MEDICAL CARE IF:   You have extreme shortness of breath.   You have trouble talking.   You have severe chest pain or blood in your sputum.   You have a high fever, weakness, repeated vomiting, or fainting.   You feel confused.   You keep getting worse.  MAKE SURE YOU:     Understand these instructions.   Will watch your condition.   Will get help right away if you are not doing well or get worse.  Document Released: 05/26/2007 Document Revised: 10/21/2011 Document Reviewed: 03/26/2011  [employer] Patient Information 2013 [employer].

## 2012-08-22 NOTE — ED Provider Notes (Signed)
History     CSN: [id number]  Arrival date & time 08/22/12  1613   First MD Initiated Contact with Patient 08/22/12 1614      No chief complaint on file.   (Consider location/radiation/quality/duration/timing/severity/associated sxs/prior treatment) HPI   79 year old male with history of COPD, prostate cancer, and hypertension presents complaining of chest pain and shortness of breath. Patient reports for the past several months he has had chronic non exertional chest discomfort. Describe pain as a achy pressure sensation, non radiating, mild to moderate, lasting for hours. His pain usually improves with taking nitroglycerin. Since yesterday he has had increase chest discomfort and shortness of breath. Shortness of breath is non exertional.  He endorsed nonproductive cough, lightheadedness,  chest pressure, sometimes worsen with movement. The cp has been lasting for 7-8 hrs and improves after taking his nitro.  Report his SOB improves after receiving breathing treatment from EMS. Has had similar bout of SOB a month ago and treated with prednisone.   Pt denies fever, chills, productive cough, sneezing, confusion, or rash.  He was also given ASA by EMS prior to arrival.    Pt had cardiac catherization by Dr. Jacinto Halim on 07/28/2012 which shows evidence of CAD, (80% stenosis of OM).  RECOMMENDATION: Medical therapy with aggressive risk factor reduction.   Past Medical History  Diagnosis Date  . COPD (chronic obstructive pulmonary disease)   . History of prostate cancer   . Hypertension   . Hyperlipidemia   . History of pneumonia   . Right knee DJD   . History of peptic ulcer disease   . Anemia   . Gastritis   . Urinary retention     with resolved hydronephrosis  . History of hypokalemia   . Hyponatremia     Past Surgical History  Procedure Date  . Partial gastrectomy 1950    gastric ulcer  . Prostatectomy   . Right total knee replacement 11/22/2010    Family History  Problem  Relation Age of Onset  . Breast cancer Mother   . COPD Sister   . Stroke Father   . Hypertension Father   . Diabetes Brother     History  Substance Use Topics  . Smoking status: Former Smoker -- 1.0 packs/day for 20 years    Types: Cigarettes    Quit date: 08/12/1968  . Smokeless tobacco: Never Used  . Alcohol Use: No      Review of Systems  Constitutional:       A complete 10 system review of systems was obtained and all systems are negative except as noted in the HPI and PMH.    Allergies  Review of patient's allergies indicates no known allergies.  Home Medications   Current Outpatient Rx  Name  Route  Sig  Dispense  Refill  . ALBUTEROL SULFATE HFA 108 (90 BASE) MCG/ACT IN AERS   Inhalation   Inhale 1 puff into the lungs every 4 (four) hours as needed. For shortness of breath         . ALBUTEROL SULFATE (2.5 MG/3ML) 0.083% IN NEBU   Nebulization   Take 2.5 mg by nebulization every 4 (four) hours as needed. For shortness of breath         . AMLODIPINE BESYLATE 10 MG PO TABS   Oral   Take 10 mg by mouth daily.           . ASPIRIN 81 MG PO TABS   Oral   Take 81 mg by  mouth daily.         . BUDESONIDE-FORMOTEROL FUMARATE 160-4.5 MCG/ACT IN AERO   Inhalation   Inhale 2 puffs into the lungs 2 (two) times daily.         Marland Kitchen VITAMIN B-12 IJ   Injection   Inject 1,000 mcg as directed every 3 (three) months.         . ERGOCALCIFEROL 50000 UNITS PO CAPS   Oral   Take 50,000 Units by mouth 2 (two) times a week. On Mon & Thrus         . METOPROLOL SUCCINATE ER 25 MG PO TB24   Oral   Take 1 tablet (25 mg total) by mouth daily.   30 tablet   2   . NITROGLYCERIN 0.4 MG SL SUBL   Sublingual   Place 0.4 mg under the tongue every 5 (five) minutes as needed. For chest pain         . PANTOPRAZOLE SODIUM 40 MG PO TBEC   Oral   Take 40 mg by mouth daily.         Marland Kitchen SIMVASTATIN 20 MG PO TABS   Oral   Take 20 mg by mouth daily.           Marland Kitchen  TIOTROPIUM BROMIDE MONOHYDRATE 18 MCG IN CAPS   Inhalation   Place 18 mcg into inhaler and inhale daily.             There were no vitals taken for this visit.  Physical Exam  Nursing note and vitals reviewed. Constitutional: He is oriented to person, place, and time. No distress.       Thin appearing FFM.  Awake, alert, nontoxic appearance  HENT:  Head: Atraumatic.  Right Ear: External ear normal.  Left Ear: External ear normal.  Mouth/Throat: Oropharynx is clear and moist. No oropharyngeal exudate.  Eyes: Conjunctivae normal are normal. Right eye exhibits no discharge. Left eye exhibits no discharge.  Neck: Normal range of motion. Neck supple.  Cardiovascular: Normal rate and regular rhythm.  Exam reveals no gallop and no friction rub.   No murmur heard. Pulmonary/Chest: Effort normal. No respiratory distress. He has wheezes (mild scattered wheezes and decreased lung sounds.  No rales). He exhibits no tenderness.  Abdominal: Soft. There is no tenderness. There is no rebound.  Musculoskeletal: He exhibits no edema and no tenderness.       ROM appears intact, no obvious focal weakness  Neurological: He is alert and oriented to person, place, and time.  Skin: Skin is warm and dry. No rash noted.  Psychiatric: He has a normal mood and affect.    ED Course  Procedures (including critical care time)  Labs Reviewed - No data to display No results found.   No diagnosis found.   Date: 08/22/2012  Rate: 62  Rhythm: normal sinus rhythm  QRS Axis: normal  Intervals: normal  ST/T Wave abnormalities: normal  Conduction Disutrbances: none  Narrative Interpretation:   Old EKG Reviewed: No significant changes noted as compared to November 22, 2010.  Results for orders placed during the hospital encounter of 08/22/12  PRO B NATRIURETIC PEPTIDE      Component Value Range   Pro B Natriuretic peptide (BNP) 553.4 (*) 0 - 450 pg/mL  CBC WITH DIFFERENTIAL      Component Value Range    WBC 3.8 (*) 4.0 - 10.5 K/uL   RBC 3.73 (*) 4.22 - 5.81 MIL/uL   Hemoglobin 10.8 (*) 13.0 -  17.0 g/dL   HCT 40.9 (*) 81.1 - 91.4 %   MCV 86.1  78.0 - 100.0 fL   MCH 29.0  26.0 - 34.0 pg   MCHC 33.6  30.0 - 36.0 g/dL   RDW 78.2  95.6 - 21.3 %   Platelets 192  150 - 400 K/uL   Neutrophils Relative 54  43 - 77 %   Neutro Abs 2.1  1.7 - 7.7 K/uL   Lymphocytes Relative 25  12 - 46 %   Lymphs Abs 1.0  0.7 - 4.0 K/uL   Monocytes Relative 17 (*) 3 - 12 %   Monocytes Absolute 0.6  0.1 - 1.0 K/uL   Eosinophils Relative 4  0 - 5 %   Eosinophils Absolute 0.2  0.0 - 0.7 K/uL   Basophils Relative 0  0 - 1 %   Basophils Absolute 0.0  0.0 - 0.1 K/uL  COMPREHENSIVE METABOLIC PANEL      Component Value Range   Sodium 140  135 - 145 mEq/L   Potassium 4.1  3.5 - 5.1 mEq/L   Chloride 104  96 - 112 mEq/L   CO2 26  19 - 32 mEq/L   Glucose, Bld 127 (*) 70 - 99 mg/dL   BUN 14  6 - 23 mg/dL   Creatinine, Ser 0.86  0.50 - 1.35 mg/dL   Calcium 9.2  8.4 - 57.8 mg/dL   Total Protein 6.6  6.0 - 8.3 g/dL   Albumin 3.3 (*) 3.5 - 5.2 g/dL   AST 21  0 - 37 U/L   ALT 10  0 - 53 U/L   Alkaline Phosphatase 80  39 - 117 U/L   Total Bilirubin 0.2 (*) 0.3 - 1.2 mg/dL   GFR calc non Af Amer 59 (*) >90 mL/min   GFR calc Af Amer 68 (*) >90 mL/min  POCT I-STAT TROPONIN I      Component Value Range   Troponin i, poc 0.02  0.00 - 0.08 ng/mL   Comment 3           CG4 I-STAT (LACTIC ACID)      Component Value Range   Lactic Acid, Venous 1.23  0.5 - 2.2 mmol/L   Dg Chest 2 View  08/22/2012  *RADIOLOGY REPORT*  Clinical Data: Short of breath, COPD  CHEST - 2 VIEW  Comparison: None.  Findings: Normal cardiac silhouette.  Lungs are hyperinflated.  No effusion, infiltrate, or pneumothorax. Degenerative osteophytosis of the thoracic spine.  IMPRESSION: Emphysematous change without acute findings.   Original Report Authenticated By: Genevive Bi, M.D.     1. COPD exacerbation   MDM  Acute on chronic SOB with hx of  COPD and hx of CAD.  Has chronic non exertional chest pain.  Last treatment for COPD exacerbation was a month ago with prednisone.  Is not on home O2.  Appears nontoxic.  Pt was evaluated by me and my attending.    Will obtain 1 set of cardiac marker, and will provide symptomatic treatment with albuterol/atrovent nebs.    6:06 PM Pt currently resting comfortably, no resp distress.  Felt better after receiving treatment.  His work up is [employer].  No significant cardiac changes, no evidence of infection.  Will d/c with steroid course and will have pt f/u with PCP for further care.  Return precaution discussed.    BP 158/79  Pulse 63  Temp 97.5 F (36.4 C) (Oral)  Resp 20  SpO2 98%  I have  reviewed nursing notes and vital signs. I personally reviewed the imaging tests through PACS system  I reviewed available ER/hospitalization records thought the EMR   Fayrene Helper, [place] 08/22/12 1810

## 2012-08-22 NOTE — ED Notes (Signed)
Per report pt has been having what he describes as chest tightness onset this am.  He reportedly contacted EMS due to SOB.  On EMS arrival pt was noted to have tachypnea (22 bpm) He was administered 5 mg of albuterol per neb which he showed improvement post treatment.  He was also administered ASA 324 mg po.  He is alert and oriented x 4 no distress noted.

## 2012-08-22 NOTE — ED Provider Notes (Signed)
Medical screening examination/treatment/procedure(s) were conducted as a shared visit with non-physician practitioner(s) and myself.  I personally evaluated the patient during the encounter.  This 78 year old male has a chronic chest pain syndrome with recent cardiac catheterization revealing coronary artery disease treated medically, he has daily chest pain for hours at a time worse with position changes, at baseline he has exertional shortness breath and is to walk slowly but he walks slowly he can walk 50-100 feet without difficulty usually, his last COPD exacerbation was about a month ago he was on steroids at that time, he states that over the last couple of days he has been coughing more than usual and more short than usual even at rest the last couple of days, he is no fever no confusion no rash no trauma. Today he has his typical chest discomfort like his had every day for months, his chest discomfort has been present for over 8 hours today constantly, it is nonexertional, he does feel much better with his breathing after nebulizer from EMS, he also received aspirin from EMS and aspirin when he was at home earlier today. His lungs show minimal scattered expiratory wheezes noted he feels much better and is not currently short of breath at rest.  ECG: Normal sinus rhythm, ventricular rate 62, normal axis, normal intervals, no acute ischemic changes noted, impression normal ECG, no significant change noted compared to April 2012  Hurman Horn, MD 08/23/12 1313

## 2012-11-13 ENCOUNTER — Encounter: Payer: Self-pay | Admitting: Internal Medicine

## 2012-11-13 ENCOUNTER — Ambulatory Visit (INDEPENDENT_AMBULATORY_CARE_PROVIDER_SITE_OTHER): Payer: Medicare Other | Admitting: Internal Medicine

## 2012-11-13 VITALS — BP 156/80 | HR 81 | Temp 96.8°F | Ht 65.0 in | Wt 107.6 lb

## 2012-11-13 DIAGNOSIS — J449 Chronic obstructive pulmonary disease, unspecified: Secondary | ICD-10-CM

## 2012-11-13 MED ORDER — PREDNISONE (PAK) 10 MG PO TABS: ORAL_TABLET | ORAL | Status: DC

## 2012-11-13 NOTE — Patient Instructions (Addendum)
Plan A = automatic = symbicort 2 puffs followed by spiriva each am then use symbicort about 12 hours later  Plan B = Backup  Only use your albuterol as a rescue medication to be used if you can't catch your breath by resting or doing a relaxed purse lip breathing pattern. The less you use it, the better it will work when you need it. Ok to use up 2 puffs every 4 hours (use inhaler first then the nebulizer second)  if needed but if not better return to see me as soon as possible  Work on inhaler technique:  relax and gently blow all the way out then take a nice smooth deep breath back in, triggering the inhaler at same time you start breathing in.  Hold for up to 5 seconds if you can.  Rinse and gargle with water when done   If your mouth or throat starts to bother you,   I suggest you time the inhaler to your dental care and after using the inhaler(s) brush teeth and tongue with a baking soda containing toothpaste and when you rinse this out, gargle with it first to see if this helps your mouth and throat.     Prednisone 10 mg take  4 each am x 2 days,   2 each am x 2 days,  1 each am x 2 days and stop   Please schedule a follow up office visit in 6 weeks, call sooner if needed with your wife if possible

## 2012-11-13 NOTE — Progress Notes (Signed)
  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   rec Symbicort 160 Take 2 puffs first thing in am and then another 2 puffs about 12 hours later.  Stop advair Stay on spiriva Work on inhaler technique: . Please schedule a follow up office visit in 4 weeks, sooner if needed with pft's   05/21/2012 f/u ov/Michala Deblanc cc breathing better but still c/o doe eg  limiting from walking the dogs more than 15-20 min s stopping, very slow pace. rec Work on inhaler technique:but no change rx    11/13/2012 f/u ov/Shakila Mak cc Chief Complaint  Patient presents with  . Follow-up    SOB progressivley worse since last visit, also c/o cough worse for the past 6 months, prod with white sputum   using saba 3 puffs per day with very poor technique plus neb, confused with how to use rescue, did not come with wife but says he does his own inhalers, she does his meds.  Doe x > 20 min walking dogs     No obvious daytime variabilty or assoc purulent sputum cp or chest tightness, subjective wheeze overt sinus or hb symptoms. No unusual exp hx or h/o childhood pna/ asthma or premature birth to his knowledge.   Sleeping ok without nocturnal  or early am exacerbation  of respiratory  c/o's or need for noct saba. Also denies any obvious fluctuation of symptoms with weather or environmental changes or other aggravating or alleviating factors except as outlined above.  ROS  The following are not active complaints unless bolded sore throat, dysphagia, dental  problems, itching, sneezing,  nasal congestion or excess/ purulent secretions, ear ache,   fever, chills, sweats, unintended wt loss, pleuritic or exertional cp, hemoptysis,  orthopnea pnd or leg swelling, presyncope, palpitations, heartburn, abdominal pain, anorexia, nausea, vomiting, diarrhea  or change in bowel or urinary habits, change in stools or urine, dysuria,hematuria,  rash, arthralgias, visual complaints, headache, numbness weakness or ataxia or problems with walking or coordination,  change in mood/affect or memory.               Objective:   Physical Exam  Wt Readings from Last 3 Encounters:  11/13/12 107 lb 9.6 oz (48.807 kg)  07/28/12 107 lb (48.535 kg)  07/28/12 107 lb (48.535 kg)     edentulous thin bm nad walking with cane mild / mod hoarse HEENT mild turbinate edema.  Oropharynx no thrush or excess pnd or cobblestoning.  No JVD or cervical adenopathy. Mild accessory muscle hypertrophy. Trachea midline, nl thryroid. Chest was hyperinflated by percussion with diminished breath sounds and moderate increased exp time without wheeze. Hoover sign positive at mid inspiration. Regular rate and rhythm without murmur gallop or rub or increase P2 or edema.  Abd: no hsm, nl excursion. Ext warm without cyanosis or clubbing.    CXR  04/07/2012 : Emphysema. No acute findings identified.         Assessment & Plan:

## 2012-11-14 NOTE — Assessment & Plan Note (Addendum)
-   hfa 50% 04/07/2012 > 75% 05/21/2012 > 75% 11/13/12    - PFTs 05/21/2012 0.69 (32) ratio 41 and 34% better p B2  DDX of  difficult airways managment all start with A and  include Adherence, Ace Inhibitors, Acid Reflux, Active Sinus Disease, Alpha 1 Antitripsin deficiency, Anxiety masquerading as Airways dz,  ABPA,  allergy(esp in young), Aspiration (esp in elderly), Adverse effects of DPI,  Active smokers, plus two Bs  = Bronchiectasis and Beta blocker use..and one C= CHF  Adherence is always the initial "prime suspect" and is a multilayered concern that requires a "trust but verify" approach in every patient - starting with knowing how to use medications, especially inhalers, correctly, keeping up with refills and understanding the fundamental difference between maintenance and prns vs those medications only taken for a very short course and then stopped and not refilled. The proper method of use, as well as anticipated side effects, of a metered-dose inhaler are discussed and demonstrated to the patient. Improved effectiveness after extensive coaching during this visit to a level of approximately  75% so work on optimal rx and min saba rx  ? Acid reflux > max rx/ diet reviewed

## 2012-12-25 ENCOUNTER — Encounter: Payer: Self-pay | Admitting: Internal Medicine

## 2012-12-25 ENCOUNTER — Ambulatory Visit (INDEPENDENT_AMBULATORY_CARE_PROVIDER_SITE_OTHER): Payer: Medicare Other | Admitting: Internal Medicine

## 2012-12-25 VITALS — BP 144/64 | HR 64 | Temp 97.5°F | Ht 63.5 in | Wt 104.2 lb

## 2012-12-25 DIAGNOSIS — J449 Chronic obstructive pulmonary disease, unspecified: Secondary | ICD-10-CM

## 2012-12-25 NOTE — Progress Notes (Signed)
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   rec Symbicort 160 Take 2 puffs first thing in am and then another 2 puffs about 12 hours later.  Stop advair Stay on spiriva Work on inhaler technique: . Please schedule a follow up office visit in 4 weeks, sooner if needed with pft's   05/21/2012 f/u ov/Cristian Collins cc breathing better but still c/o doe eg  limiting from walking the dogs more than 15-20 min s stopping, very slow pace. rec Work on inhaler technique:but no change rx    11/13/2012 f/u ov/Cristian Collins cc Chief Complaint  Patient presents with  . Follow-up    SOB progressivley worse since last visit, also c/o cough worse for the past 6 months, prod with white sputum   using saba 3 puffs per day with very poor technique plus neb, confused with how to use rescue, did not come with wife but says he does his own inhalers, she does his meds.  Doe x > 20 min walking dogs rec Plan A thru C reviewed in writing    12/25/2012 f/u ov/Cristian Collins re GOLD III COPD Chief Complaint  Patient presents with  . Follow-up    Cough has pretty much resolved and his breathing is much improved since the last visit. No new co's today.    avg use couple times a week saba and really not limited from desired activities by sob    No obvious daytime variabilty or assoc purulent sputum cp or chest tightness, subjective wheeze overt sinus or hb symptoms. No unusual exp hx or h/o childhood pna/ asthma or premature birth to  his knowledge.   Sleeping ok without nocturnal  or early am exacerbation  of respiratory  c/o's or need for noct saba. Also denies any obvious fluctuation of symptoms with weather or environmental changes or other aggravating or alleviating factors except as outlined above.  Current Medications, Allergies, Past Medical History, Past Surgical History, Family History, and Social History were reviewed in [employer] record.  ROS  The following are not active complaints unless bolded sore throat, dysphagia, dental problems, itching, sneezing,  nasal congestion or excess/ purulent secretions, ear ache,   fever, chills, sweats, unintended wt loss, pleuritic or exertional cp, hemoptysis,  orthopnea pnd or leg swelling, presyncope, palpitations, heartburn, abdominal pain, anorexia, nausea, vomiting, diarrhea  or change in bowel or urinary habits, change in stools or urine, dysuria,hematuria,  rash, arthralgias, visual complaints, headache, numbness weakness or ataxia or problems with walking or coordination,  change in mood/affect or memory.                   Objective:   Physical Exam  Wt Readings from Last 3 Encounters:  12/25/12 104 lb 3.2 oz (47.265 kg)  11/13/12 107 lb 9.6 oz (48.807 kg)  07/28/12 107 lb (48.535 kg)       edentulous thin bm nad walking with cane mild /  mod hoarse HEENT mild turbinate edema.  Oropharynx no thrush or excess pnd or cobblestoning.  No JVD or cervical adenopathy. Mild accessory muscle hypertrophy. Trachea midline, nl thryroid. Chest was hyperinflated by percussion with diminished breath sounds and moderate increased exp time without wheeze. Hoover sign positive at mid inspiration. Regular rate and rhythm without murmur gallop or rub or increase P2 or edema.  Abd: no hsm, nl excursion. Ext warm without cyanosis or clubbing.    CXR  04/07/2012 : Emphysema. No acute findings identified.         Assessment & Plan:

## 2012-12-25 NOTE — Patient Instructions (Addendum)
Plan A = automatic = symbicort 2 puffs followed by spiriva each am then use symbicort about 12 hours later  Plan B = Backup  Only use your albuterol as a rescue medication to be used if you can't catch your breath by resting or doing a relaxed purse lip breathing pattern. The less you use it, the better it will work when you need it. Ok to use up 2 puffs every 4 hours (use inhaler first then the nebulizer second- plan C)  if needed but if not better return to see me as soon as possible (Plan D) and if all else fails Plan E = go to ER     If you are satisfied with your treatment plan let your doctor know and he/she can either refill your medications or you can return here when your prescription runs out.     If in any way you are not 100% satisfied,  please tell us.  If 100% better, tell your friends!

## 2012-12-27 NOTE — Assessment & Plan Note (Addendum)
-   hfa 50% 04/07/2012 > 75% 05/21/2012 > 75% 11/13/12    - PFTs 05/21/2012 0.69 (32) ratio 41 and 34% better p B2  The proper method of use, as well as anticipated side effects, of a metered-dose inhaler are discussed and demonstrated to the patient. Improved effectiveness after extensive coaching during this visit to a level of approximately  75% but baseline is now 75% as well  Whereas in pas <50%   I had an extended summary/ final discussion with the patient and wife today lasting 15 to 20 minutes of a 25 minute visit on the following issues:     Each maintenance medication was reviewed in detail including most importantly the difference between maintenance and as needed and under what circumstances the prns are to be used.  Please see instructions for details (including a very simple Action plan re use of saba) which were reviewed in writing and the patient given a copy.    Pulmonary f/u can be prn

## 2013-02-23 ENCOUNTER — Encounter (HOSPITAL_COMMUNITY): Payer: Self-pay | Admitting: Pharmacy Technician

## 2013-03-09 ENCOUNTER — Encounter (HOSPITAL_COMMUNITY): Payer: Self-pay | Admitting: General Practice

## 2013-03-09 ENCOUNTER — Encounter (HOSPITAL_COMMUNITY)
Admission: RE | Disposition: A | Discharge status: Home / Self Care | Payer: Self-pay | Source: Ambulatory Visit | Attending: Cardiology

## 2013-03-09 ENCOUNTER — Ambulatory Visit (HOSPITAL_COMMUNITY)
Admission: RE | Admit: 2013-03-09 | Discharge: 2013-03-10 | Disposition: A | Discharge status: Home / Self Care | Payer: Medicare Other | Source: Ambulatory Visit | Attending: Cardiology | Admitting: Cardiology

## 2013-03-09 DIAGNOSIS — Z79899 Other long term (current) drug therapy: Secondary | ICD-10-CM | POA: Insufficient documentation

## 2013-03-09 DIAGNOSIS — E785 Hyperlipidemia, unspecified: Secondary | ICD-10-CM | POA: Insufficient documentation

## 2013-03-09 DIAGNOSIS — I209 Angina pectoris, unspecified: Secondary | ICD-10-CM | POA: Insufficient documentation

## 2013-03-09 DIAGNOSIS — Z955 Presence of coronary angioplasty implant and graft: Secondary | ICD-10-CM

## 2013-03-09 DIAGNOSIS — D649 Anemia, unspecified: Secondary | ICD-10-CM | POA: Insufficient documentation

## 2013-03-09 DIAGNOSIS — I10 Essential (primary) hypertension: Secondary | ICD-10-CM | POA: Insufficient documentation

## 2013-03-09 DIAGNOSIS — I2510 Atherosclerotic heart disease of native coronary artery without angina pectoris: Secondary | ICD-10-CM | POA: Insufficient documentation

## 2013-03-09 HISTORY — DX: Chest pain, unspecified: R07.9

## 2013-03-09 HISTORY — PX: CORONARY ANGIOPLASTY WITH STENT PLACEMENT: SHX49

## 2013-03-09 HISTORY — DX: Atherosclerotic heart disease of native coronary artery without angina pectoris: I25.10

## 2013-03-09 HISTORY — PX: PERCUTANEOUS CORONARY STENT INTERVENTION (PCI-S): SHX5485

## 2013-03-09 HISTORY — PX: LEFT HEART CATHETERIZATION WITH CORONARY ANGIOGRAM: SHX5451

## 2013-03-09 HISTORY — DX: Malignant neoplasm of prostate: C61

## 2013-03-09 HISTORY — DX: Migraine, unspecified, not intractable, without status migrainosus: G43.909

## 2013-03-09 HISTORY — DX: Unspecified chronic bronchitis: J42

## 2013-03-09 HISTORY — DX: Pneumonia, unspecified organism: J18.9

## 2013-03-09 HISTORY — DX: Shortness of breath: R06.02

## 2013-03-09 LAB — POCT ACTIVATED CLOTTING TIME: Activated Clotting Time: 181 seconds

## 2013-03-09 SURGERY — LEFT HEART CATHETERIZATION WITH CORONARY ANGIOGRAM
Anesthesia: LOCAL

## 2013-03-09 MED ORDER — SODIUM CHLORIDE 0.9 % IJ SOLN: 3.0000 mL | INTRAMUSCULAR | Status: DC | PRN

## 2013-03-09 MED ORDER — SODIUM CHLORIDE 0.9 % IV SOLN: 250.0000 mL | INTRAVENOUS | Status: DC | PRN

## 2013-03-09 MED ORDER — ALBUTEROL SULFATE HFA 108 (90 BASE) MCG/ACT IN AERS
2.0000 | INHALATION_SPRAY | RESPIRATORY_TRACT | Status: DC | PRN
  Filled 2013-03-09: qty 6.7

## 2013-03-09 MED ORDER — SODIUM CHLORIDE 0.9 % IJ SOLN: 3.0000 mL | Freq: Two times a day (BID) | INTRAMUSCULAR | Status: DC

## 2013-03-09 MED ORDER — PANTOPRAZOLE SODIUM 40 MG PO TBEC
40.0000 mg | DELAYED_RELEASE_TABLET | Freq: Every day | ORAL | Status: DC
  Administered 2013-03-10: 11:00:00 40 mg via ORAL
  Filled 2013-03-09: qty 1

## 2013-03-09 MED ORDER — TICAGRELOR 90 MG PO TABS
90.0000 mg | ORAL_TABLET | Freq: Two times a day (BID) | ORAL | Status: DC
  Administered 2013-03-09 – 2013-03-10 (×2): 90 mg via ORAL
  Filled 2013-03-09 (×3): qty 1

## 2013-03-09 MED ORDER — MIDAZOLAM HCL 2 MG/2ML IJ SOLN
INTRAMUSCULAR | Status: AC
  Filled 2013-03-09: qty 2

## 2013-03-09 MED ORDER — BIVALIRUDIN 250 MG IV SOLR
INTRAVENOUS | Status: AC
  Filled 2013-03-09: qty 250

## 2013-03-09 MED ORDER — SIMVASTATIN 20 MG PO TABS
20.0000 mg | ORAL_TABLET | Freq: Every day | ORAL | Status: DC
  Administered 2013-03-09 – 2013-03-10 (×2): 20 mg via ORAL
  Filled 2013-03-09 (×2): qty 1

## 2013-03-09 MED ORDER — TICAGRELOR 90 MG PO TABS
ORAL_TABLET | ORAL | Status: AC
  Filled 2013-03-09: qty 2

## 2013-03-09 MED ORDER — SODIUM CHLORIDE 0.9 % IV BOLUS (SEPSIS)
500.0000 mL | Freq: Once | INTRAVENOUS | Status: AC
  Administered 2013-03-09: 06:00:00 via INTRAVENOUS

## 2013-03-09 MED ORDER — FERROUS FUMARATE 325 (106 FE) MG PO TABS
1.0000 | ORAL_TABLET | Freq: Every day | ORAL | Status: DC
  Administered 2013-03-09 – 2013-03-10 (×2): 106 mg via ORAL
  Filled 2013-03-09 (×2): qty 1

## 2013-03-09 MED ORDER — ASPIRIN 81 MG PO CHEW
CHEWABLE_TABLET | ORAL | Status: AC
  Filled 2013-03-09: qty 4

## 2013-03-09 MED ORDER — ISOSORBIDE MONONITRATE ER 60 MG PO TB24
60.0000 mg | ORAL_TABLET | Freq: Every day | ORAL | Status: DC
  Administered 2013-03-10: 11:00:00 60 mg via ORAL
  Filled 2013-03-09: qty 1

## 2013-03-09 MED ORDER — NITROGLYCERIN 0.4 MG SL SUBL
0.4000 mg | SUBLINGUAL_TABLET | SUBLINGUAL | Status: DC | PRN
  Administered 2013-03-09 (×2): 0.4 mg via SUBLINGUAL
  Filled 2013-03-09: qty 25

## 2013-03-09 MED ORDER — LIDOCAINE HCL (PF) 1 % IJ SOLN
INTRAMUSCULAR | Status: AC
  Filled 2013-03-09: qty 30

## 2013-03-09 MED ORDER — ONDANSETRON HCL 4 MG/2ML IJ SOLN: 4.0000 mg | Freq: Four times a day (QID) | INTRAMUSCULAR | Status: DC | PRN

## 2013-03-09 MED ORDER — HEPARIN SODIUM (PORCINE) 1000 UNIT/ML IJ SOLN
INTRAMUSCULAR | Status: AC
  Filled 2013-03-09: qty 1

## 2013-03-09 MED ORDER — ASPIRIN 81 MG PO CHEW
CHEWABLE_TABLET | ORAL | Status: AC
  Filled 2013-03-09: qty 1

## 2013-03-09 MED ORDER — VERAPAMIL HCL 2.5 MG/ML IV SOLN
INTRAVENOUS | Status: AC
  Filled 2013-03-09: qty 2

## 2013-03-09 MED ORDER — ALPRAZOLAM 0.25 MG PO TABS: 0.2500 mg | ORAL_TABLET | Freq: Two times a day (BID) | ORAL | Status: DC | PRN

## 2013-03-09 MED ORDER — ASPIRIN 81 MG PO CHEW
324.0000 mg | CHEWABLE_TABLET | ORAL | Status: AC
  Administered 2013-03-09: 324 mg via ORAL

## 2013-03-09 MED ORDER — FENTANYL CITRATE 0.05 MG/ML IJ SOLN
INTRAMUSCULAR | Status: AC
  Filled 2013-03-09: qty 2

## 2013-03-09 MED ORDER — METOPROLOL SUCCINATE ER 25 MG PO TB24
25.0000 mg | ORAL_TABLET | Freq: Every day | ORAL | Status: DC
  Administered 2013-03-10: 11:00:00 25 mg via ORAL
  Filled 2013-03-09: qty 1

## 2013-03-09 MED ORDER — TIOTROPIUM BROMIDE MONOHYDRATE 18 MCG IN CAPS
18.0000 ug | ORAL_CAPSULE | Freq: Every day | RESPIRATORY_TRACT | Status: DC
  Administered 2013-03-10: 18 ug via RESPIRATORY_TRACT
  Filled 2013-03-09: qty 5

## 2013-03-09 MED ORDER — ALBUTEROL SULFATE (5 MG/ML) 0.5% IN NEBU: 2.5000 mg | INHALATION_SOLUTION | Freq: Four times a day (QID) | RESPIRATORY_TRACT | Status: DC | PRN

## 2013-03-09 MED ORDER — ACETAMINOPHEN 325 MG PO TABS: 650.0000 mg | ORAL_TABLET | ORAL | Status: DC | PRN

## 2013-03-09 MED ORDER — ASPIRIN EC 81 MG PO TBEC
81.0000 mg | DELAYED_RELEASE_TABLET | Freq: Every day | ORAL | Status: DC
  Administered 2013-03-10: 11:00:00 81 mg via ORAL
  Filled 2013-03-09 (×2): qty 1

## 2013-03-09 MED ORDER — SODIUM CHLORIDE 0.9 % IV SOLN: INTRAVENOUS | Status: DC

## 2013-03-09 MED ORDER — AMLODIPINE BESYLATE 10 MG PO TABS
10.0000 mg | ORAL_TABLET | Freq: Every day | ORAL | Status: DC
  Administered 2013-03-10: 11:00:00 10 mg via ORAL
  Filled 2013-03-09 (×2): qty 1

## 2013-03-09 MED ORDER — ALBUTEROL SULFATE HFA 108 (90 BASE) MCG/ACT IN AERS
2.0000 | INHALATION_SPRAY | Freq: Four times a day (QID) | RESPIRATORY_TRACT | Status: DC
  Filled 2013-03-09: qty 6.7

## 2013-03-09 MED ORDER — NITROGLYCERIN 0.2 MG/ML ON CALL CATH LAB
INTRAVENOUS | Status: AC
  Filled 2013-03-09: qty 1

## 2013-03-09 MED ORDER — SODIUM CHLORIDE 0.9 % IV SOLN: 1.0000 mL/kg/h | INTRAVENOUS | Status: AC

## 2013-03-09 MED ORDER — ZOLPIDEM TARTRATE 5 MG PO TABS: 5.0000 mg | ORAL_TABLET | Freq: Every evening | ORAL | Status: DC | PRN

## 2013-03-09 MED ORDER — HEPARIN (PORCINE) IN NACL 2-0.9 UNIT/ML-% IJ SOLN
INTRAMUSCULAR | Status: AC
  Filled 2013-03-09: qty 1000

## 2013-03-09 MED ORDER — SODIUM CHLORIDE 0.9 % IJ SOLN
3.0000 mL | Freq: Two times a day (BID) | INTRAMUSCULAR | Status: DC
  Administered 2013-03-09: 3 mL via INTRAVENOUS

## 2013-03-09 MED ORDER — BUDESONIDE-FORMOTEROL FUMARATE 160-4.5 MCG/ACT IN AERO
2.0000 | INHALATION_SPRAY | Freq: Two times a day (BID) | RESPIRATORY_TRACT | Status: DC
  Administered 2013-03-09 – 2013-03-10 (×2): 2 via RESPIRATORY_TRACT
  Filled 2013-03-09: qty 6

## 2013-03-09 NOTE — Care Management Note (Addendum)
  Page 1 of 1   03/09/2013     11:04:18 AM   CARE MANAGEMENT NOTE 03/09/2013  Patient:  Cristian Collins, Cristian Collins   Account Number:  [id number]  Date Initiated:  03/09/2013  Documentation initiated by:  Oletta Cohn  Subjective/Objective Assessment:   79 yo male admitted with chest pain///hm with wife     Action/Plan:   left heart cath with stents//Brilinta benefits check   Anticipated DC Date:  03/10/2013   Anticipated DC Plan:  HOME/SELF CARE      DC Planning Services  CM consult      Choice offered to / List presented to:             Status of service:   Medicare Important Message given?   (If response is "NO", the following Medicare IM given date fields will be blank) Date Medicare IM given:   Date Additional Medicare IM given:    Discharge Disposition:    Per UR Regulation:    If discussed at Long Length of Stay Meetings, dates discussed:    Comments:  03/09/13 1045 Mathew Postiglione, RN,BSN, NSM [phone] Spoke with pt and wife at bedside regarding Brilinta medication.  Pt has Brilinta brochure with 30 day free card and prescription refill assistance card intact. Pt utilizes [facility] [address] for prescription needs. NCM called [facility] to ensure prescribed medication in stock.  Will report results of benefits check when available.

## 2013-03-09 NOTE — H&P (Signed)
  Please see office visit notes for complete details of HPI.  

## 2013-03-09 NOTE — Interval H&P Note (Signed)
History and Physical Interval Note:  03/09/2013 7:51 AM  Cristian Collins  has presented today for surgery, with the diagnosis of Chest pain  The various methods of treatment have been discussed with the patient and family. After consideration of risks, benefits and other options for treatment, the patient has consented to  Procedure(s): LEFT HEART CATHETERIZATION WITH CORONARY ANGIOGRAM (N/A) and possible PCI as a surgical intervention .  The patient's history has been reviewed, patient examined, no change in status, stable for surgery.  I have reviewed the patient's chart and labs.  Questions were answered to the patient's satisfaction.    Cath Lab Visit (complete for each Cath Lab visit)  Clinical Evaluation Leading to the Procedure:   ACS: no  Non-ACS:    Anginal Classification: CCS III  Anti-ischemic medical therapy: Maximal Therapy (2 or more classes of medications)  Non-Invasive Test Results: Low-risk stress test findings: cardiac mortality <1%/year  Prior CABG: No previous CABG       [facility] R

## 2013-03-09 NOTE — CV Procedure (Signed)
Procedure performed:  Left heart catheterization including hemodynamic monitoring of the left ventricle, LV gram. Selective right and left coronary arteriography. PTCA and stenting of the OM 1 branch of the circumflex coronary artery with implantation of a 2.25 x 16 mm promos Premier drug-eluting stent.  Indication: Patient is a 79 year-old AA male with history of hypertension,  hyperlipidemia, who presents with class 3 angina pectoris. Patient has been on aggressive medical therapy and in spite of this continues to have clear-cut exertional angina and presents with decreased physical activity, use of 5-6 sublingual nitroglycerin on a weekly basis. He had undergone coronary angiography in December of 2013, and was found to have a 60-70% OM1 stenosis which I was treating medically. He has had a negative nuclear stress test in the past. But due to worsening chest pain, lifestyle limiting chest discomfort which was clearly exertional and relieved with nitroglycerin, he was brought to the cardiac catheterization lab to reevaluate the obtuse marginal stenosis and possible PTCA. Hemodynamic data: Left ventricular pressure was 123/1 with LVEDP of 12 mm mercury. Aortic pressure was 114/56 with a mean of 82 mm mercury. There was no pressure gradient across the aortic valve.   Left ventricle: No LV gram performed.  Right coronary artery: The vessel is smooth, Dominant with mild diffuse luminal irregularity and mild diffuse calcification.  Left main coronary artery : Not existent. LAD and circumflex have separate ostia.  Circumflex coronary artery: A large vessel giving origin to a large obtuse marginal 1 and a moderate sized OM 2. OM1 has 30-40% diffuse luminal irregularity in the proximal segment. Mild calcification is also evident. OM 2 has a 60-70% stenosis after the origin of a AV groove branch. In some views the stenosis appeared to be 80%. It was mildly hazy. Mild calcification evident.   LAD:  LAD  gives origin to a large diagonal-1. LAD has diffuse mild luminal irregularities. D1 has proximal 40-50% stenosis with mild ectasia.  Impression: Moderate stenosis of OM1. Mild disease in other vessels.patient continues to have significant exertional chest pain.  Long discussion with the patient on the cardiac catheterization lab.  Patient is clearly reduced his physical activity and chest pain is located with sublingual nitroglycerin.  Hence I'll proceed with intervention to the OM1 as patient is symptomatic.  Initial plan to perform IVUS abandoned as in some views the lesion appeared to be significant and also curbside consultation performed with colleague, due to class III angina pectoris, patient on maximal medical therapy we elected to treat the lesion.  Interventional data: Successful PTCA and stenting of the OM1 with implantation of a 2.25 x 16 mm Promus premiere DE. Will need Dual antiplatelet therapy with Brilinta and ASA 81 mg for at least one year.   Technique of diagnostic cardiac catheterization:  Under sterile precautions using a 6 French right radial  arterial access, a 6 French sheath was introduced into the right radial artery. A 5 [place] Tig 4 catheter was advanced into the ascending aorta selective  right coronary artery and left coronary artery was cannulated and angiography was performed in multiple views. The catheter was pulled back Out of the body over exchange length J-wire.  Same Catheter was used to perform LV hemodynamics. Catheter exchanged out of the body over J-Wire. NO immediate complications noted. Patient tolerated the procedure well.   Technique of intervention:  Using a 6 [place] FL 4 guide catheter the circumflex  coronary  was selected and cannulated. Using Angiomax for anticoagulation, I utilized  a BMW, 180 cm x 0.014" guidewire and across the OM1 of circumflex coronary artery with mild amount of difficulty. I placed the tip of the wire into the distal  coronary  artery. Angiography was performed.   Then I utilized a 2.0 x 10 mm angiosculpt balloon, to perform angioplasty, however at the site of stenosis, the balloon could not cross.  This was in spite of aggressive approach.  Finally I pulled the balloon out of the body as I was aware that the lesion was severe and calcification gave a suboptimal stenosis percentage.  I then utilized a 2.0 x 10 mm Trek balloon and I performed angioplasty. I performed balloon angioplasty at 10 pressure x 90 seconds for1.  There was mild dissection evident, hence I decided to proceed with implantation of a 2.25 mm drug-eluting stent.  2.25 x 16 mm Promus premier stent was deployed at 14 atmospheric pressure for 60 seconds followed by repeat angiography.  Excellent result was evident and I felt that the stent was well opposed.  Had great difficulty in advancing the stent to the site of the lesion in spite of having performed balloon angioplasty prior to stent implantation.  Intracoronary nitroglycerin was administered during various phases throughout the procedure.  Post-procedure stenosis was reduced from overall 80% to 0%. TIMI-3 flow was maintained. There was no evidence of edge dissection. The guidewire was withdrawn out of the body and the guide catheter was engaged and pulled out of the body over the J-wire the was no immediate complication. Patient tolerated the procedure well.  Disposition: Patient will be discharged in morning unless complications with out-patient follow up.

## 2013-03-09 NOTE — Interval H&P Note (Signed)
History and Physical Interval Note:  03/09/2013 7:48 AM  Cristian Collins  has presented today for surgery, with the diagnosis of Chest pain  The various methods of treatment have been discussed with the patient and family. After consideration of risks, benefits and other options for treatment, the patient has consented to  Procedure(s): LEFT HEART CATHETERIZATION WITH CORONARY ANGIOGRAM (N/A) and possible PTCA as a surgical intervention .  The patient's history has been reviewed, patient examined, no change in status, stable for surgery.  I have reviewed the patient's chart and labs.  Questions were answered to the patient's satisfaction.     Pamella Pert

## 2013-03-09 NOTE — Progress Notes (Signed)
TR BAND REMOVAL  LOCATION:    right radial  DEFLATED PER PROTOCOL:    yes  TIME BAND OFF / DRESSING APPLIED:    1230   SITE UPON ARRIVAL:    Level 0  SITE AFTER BAND REMOVAL:    Level 0  REVERSE ALLEN'S TEST:     positive  CIRCULATION SENSATION AND MOVEMENT:    Within Normal Limits   yes  COMMENTS:   Tolerated procedure well 

## 2013-03-10 LAB — BASIC METABOLIC PANEL
BUN: 15 mg/dL (ref 6–23)
CO2: 23 mEq/L (ref 19–32)
Calcium: 8.9 mg/dL (ref 8.4–10.5)
Creatinine, Ser: 0.98 mg/dL (ref 0.50–1.35)
GFR calc Af Amer: 88 mL/min — ABNORMAL LOW (ref >90)

## 2013-03-10 LAB — CBC
HCT: 31.9 % — ABNORMAL LOW (ref 39.0–52.0)
MCH: 29.4 pg (ref 26.0–34.0)
MCV: 84.6 fL (ref 78.0–100.0)
Platelets: 195 10*3/uL (ref 150–400)
RDW: 13.8 % (ref 11.5–15.5)

## 2013-03-10 MED ORDER — TICAGRELOR 90 MG PO TABS: 90.0000 mg | ORAL_TABLET | Freq: Two times a day (BID) | ORAL | Status: DC

## 2013-03-10 MED FILL — Sodium Chloride IV Soln 0.9%: INTRAVENOUS | Qty: 50 | Status: AC

## 2013-03-10 NOTE — Progress Notes (Signed)
CARDIAC REHAB PHASE I   PRE:  Rate/Rhythm: 65 SR    BP: sitting 135/57    SaO2:   MODE:  Ambulation: 320 ft   POST:  Rate/Rhythm: 87 SR    BP: sitting 209/78, 184/71     SaO2:   Pt able to walk with cane. Fairly steady. Denied CP. Sts he feels better. BP very elevated after walk. To recliner. Ed completed. Pt thinking about [facility] and requests his name be sent to [facility].  [id number]   Elissa Lovett Kramer CES, ACSM 03/10/2013 8:50 AM

## 2013-03-10 NOTE — Discharge Summary (Signed)
Physician Discharge Summary  Patient ID: Cristian Collins MRN: [id number] DOB/AGE: 1930/05/10 79 y.o.  Admit date: 03/09/2013 Discharge date: 03/10/2013  Primary Discharge Diagnosis 1. CAD with Angina pectoris 2. PTCA and stenting of the OM 1 branch of the circumflex coronary artery with implantation of a 2.25 x 16 mm promos Premier drug-eluting stent on 03/09/2013.  Secondary Discharge Diagnosis Hypertension Hyperlipidemia Chronic anemia.  Significant Diagnostic Studies: Right coronary artery: The vessel is smooth, Dominant with mild diffuse luminal irregularity and mild diffuse calcification.  Left main coronary artery : Not existent. LAD and circumflex have separate ostia.  Circumflex coronary artery: A large vessel giving origin to a large obtuse marginal 1 and a moderate sized OM 2. OM1 has 30-40% diffuse luminal irregularity in the proximal segment. Mild calcification is also evident. OM 2 has a 60-70% stenosis after the origin of a AV groove branch. In some views the stenosis appeared to be 80%. It was mildly hazy. Mild calcification evident.  LAD: LAD gives origin to a large diagonal-1. LAD has diffuse mild luminal irregularities. D1 has proximal 40-50% stenosis with mild ectasia. PTCA and stenting of the OM 1 branch of the circumflex coronary artery with implantation of a 2.25 x 16 mm promos Premier drug-eluting stent  Hospital Course: Patient admitted on elective fashion due to his symptoms for repeat coronary angiography and possible angioplasty.  After extensive discussion, it is felt that the lesion was significant and we proceeded with intervention to OM1 branch of the circumflex artery.  The following morning patient stated that he was able to ambulate with the help of the cardiac rehabilitation for 300 feet and stated that he did not have similar chest heaviness that he used to have when he exerted himself and stated that his symptoms are markedly improved and he couldn't tell the  difference.  On the day of discharge he was essentially hemodynamically stable although his blood pressure was elevated, it is the first time his blood pressure was elevated.  His physical examination was unremarkable and hence felt stable for discharge with outpatient follow-up and evaluation specifically for his hypertension.  Hopefully he will have improvement in his symptoms of angina pectoris.  Discharge Exam: Blood pressure 142/65, pulse 59, temperature 98.4 F (36.9 C), temperature source Oral, resp. rate 16, height 5\' 5"  (1.651 m), weight 46.9 kg (103 lb 6.3 oz), SpO2 100.00%.  General: Moderately build and petite body habitus who is in no acute distress. Appears stated age. Alert Ox3.   There is no cyanosis. HEENT: normal limits. PERRLA, No JVD.   CARDIAC EXAM: S1, S2 normal, no gallop present. No murmur.   CHEST EXAM: No tenderness of chest wall. LUNGS: Clear to percuss and auscultate.  ABDOMEN: No hepatosplenomegaly. BS normal in all 4 quadrants. Abdomen is non-tender.   EXTREMITY: Full range of movementes, No edema. MUSCULOSKELETAL EXAM: Intact with full range of motion in all 4 extremities.   NEUROLOGIC EXAM: Grossly intact without any focal deficits. Alert O x 3. Uses a cane to walk due to gait imbalance.  VASCULAR EXAM: No skin breakdown. Carotids bilateral soft bruit present. Extremities: Femoral pulse normal. Popliteal pulse normal ; Pedal pulse normal. Otherwise No prominent pulse felt in the abdomen. No varicose veins. Right radial arterial access site has healed well. Labs:   Lab Results  Component Value Date   WBC 4.1 03/10/2013   HGB 11.1* 03/10/2013   HCT 31.9* 03/10/2013   MCV 84.6 03/10/2013   PLT 195 03/10/2013  Recent Labs Lab 03/10/13 0527  NA 138  K 4.0  CL 105  CO2 23  BUN 15  CREATININE 0.98  CALCIUM 8.9  GLUCOSE 92   Lab Results  Component Value Date   CKTOTAL 440* 11/23/2010   CKMB 7.3 CRITICAL VALUE NOTED.  VALUE IS CONSISTENT  WITH PREVIOUSLY REPORTED AND CALLED VALUE.* 11/23/2010   TROPONINI  Value: 0.04        NO INDICATION OF MYOCARDIAL INJURY. 11/23/2010    Lipid Panel     Component Value Date/Time   CHOL  Value: 166        ATP III CLASSIFICATION:  <200     mg/dL   Desirable  409-811  mg/dL   Borderline High  >=914    mg/dL   High        7/82/9562 2029   TRIG 52 11/22/2010 2029   HDL 58 11/22/2010 2029   CHOLHDL 2.9 11/22/2010 2029   VLDL 10 11/22/2010 2029   LDLCALC  Value: 98        Total Cholesterol/HDL:CHD Risk Coronary Heart Disease Risk Table                     Men   Women  1/2 Average Risk   3.4   3.3  Average Risk       5.0   4.4  2 X Average Risk   9.6   7.1  3 X Average Risk  23.4   11.0        Use the calculated Patient Ratio above and the CHD Risk Table to determine the patient's CHD Risk.        ATP III CLASSIFICATION (LDL):  <100     mg/dL   Optimal  130-865  mg/dL   Near or Above                    Optimal  130-159  mg/dL   Borderline  784-696  mg/dL   High  >295     mg/dL   Very High 2/84/1324 4010    EKG: normal EKG, normal sinus rhythm, unchanged from previous tracings, borderline criteria for LVH..   FOLLOW UP PLANS AND APPOINTMENTS Discharge Orders   Future Orders Complete By Expires     Amb Referral to Cardiac Rehabilitation  As directed         Medication List         amLODipine 10 MG tablet  Commonly known as:  NORVASC  Take 10 mg by mouth daily.     aspirin EC 81 MG tablet  Take 81 mg by mouth daily.     budesonide-formoterol 160-4.5 MCG/ACT inhaler  Commonly known as:  SYMBICORT  Inhale 2 puffs into the lungs 2 (two) times daily.     ergocalciferol 50000 UNITS capsule  Commonly known as:  VITAMIN D2  Take 50,000 Units by mouth 2 (two) times a week. On Mon & Thrus     ferrous fumarate 325 (106 FE) MG Tabs  Commonly known as:  HEMOCYTE - 106 mg FE  Take 1 tablet by mouth daily.     isosorbide mononitrate 60 MG 24 hr tablet  Commonly known as:  IMDUR  Take 60 mg by mouth  daily.     metoprolol succinate 25 MG 24 hr tablet  Commonly known as:  TOPROL-XL  Take 25 mg by mouth daily.     nitroGLYCERIN 0.4 MG SL tablet  Commonly known as:  NITROSTAT  Place 0.4 mg under the tongue every 5 (five) minutes as needed for chest pain. For chest pain     pantoprazole 40 MG tablet  Commonly known as:  PROTONIX  Take 40 mg by mouth daily.     PROAIR HFA 108 (90 BASE) MCG/ACT inhaler  Generic drug:  albuterol  Inhale 2 puffs into the lungs 4 (four) times daily. For shortness of breath     albuterol (2.5 MG/3ML) 0.083% nebulizer solution  Commonly known as:  PROVENTIL  Take 2.5 mg by nebulization every 4 (four) hours as needed for shortness of breath. For shortness of breath     simvastatin 20 MG tablet  Commonly known as:  ZOCOR  Take 20 mg by mouth daily.     Ticagrelor 90 MG Tabs tablet  Commonly known as:  BRILINTA  Take 1 tablet (90 mg total) by mouth 2 (two) times daily.     tiotropium 18 MCG inhalation capsule  Commonly known as:  SPIRIVA  Place 18 mcg into inhaler and inhale daily.     VITAMIN B-12 IJ  Inject 1,000 mcg as directed every 3 (three) months.           Follow-up Information   Follow up with [facility] R, MD. (F/U as previously scheduled.)    Contact information:   [address] [place] [address] [phone]        Pamella Pert, MD 03/10/2013, 10:17 PM  Pager: [phone] Office: [phone] If no answer: [phone]

## 2013-07-21 ENCOUNTER — Encounter (HOSPITAL_COMMUNITY)
Admission: AD | Disposition: A | Discharge status: Home / Self Care | Payer: Self-pay | Source: Ambulatory Visit | Attending: Internal Medicine

## 2013-07-21 ENCOUNTER — Encounter (HOSPITAL_COMMUNITY): Payer: Self-pay | Admitting: *Deleted

## 2013-07-21 ENCOUNTER — Inpatient Hospital Stay (HOSPITAL_COMMUNITY)
Admission: AD | Admit: 2013-07-21 | Discharge: 2013-07-22 | DRG: 383 | Disposition: A | Discharge status: Home / Self Care | Payer: Medicare Other | Source: Ambulatory Visit | Attending: Internal Medicine | Admitting: Internal Medicine

## 2013-07-21 DIAGNOSIS — K279 Peptic ulcer, site unspecified, unspecified as acute or chronic, without hemorrhage or perforation: Principal | ICD-10-CM | POA: Diagnosis present

## 2013-07-21 DIAGNOSIS — I10 Essential (primary) hypertension: Secondary | ICD-10-CM | POA: Diagnosis present

## 2013-07-21 DIAGNOSIS — J449 Chronic obstructive pulmonary disease, unspecified: Secondary | ICD-10-CM | POA: Diagnosis present

## 2013-07-21 DIAGNOSIS — K219 Gastro-esophageal reflux disease without esophagitis: Secondary | ICD-10-CM | POA: Diagnosis present

## 2013-07-21 DIAGNOSIS — E41 Nutritional marasmus: Secondary | ICD-10-CM | POA: Diagnosis present

## 2013-07-21 DIAGNOSIS — E785 Hyperlipidemia, unspecified: Secondary | ICD-10-CM | POA: Diagnosis present

## 2013-07-21 DIAGNOSIS — Z96659 Presence of unspecified artificial knee joint: Secondary | ICD-10-CM

## 2013-07-21 DIAGNOSIS — Z7982 Long term (current) use of aspirin: Secondary | ICD-10-CM

## 2013-07-21 DIAGNOSIS — Z9861 Coronary angioplasty status: Secondary | ICD-10-CM

## 2013-07-21 DIAGNOSIS — J189 Pneumonia, unspecified organism: Secondary | ICD-10-CM | POA: Diagnosis present

## 2013-07-21 DIAGNOSIS — Z87891 Personal history of nicotine dependence: Secondary | ICD-10-CM

## 2013-07-21 DIAGNOSIS — D638 Anemia in other chronic diseases classified elsewhere: Secondary | ICD-10-CM | POA: Diagnosis present

## 2013-07-21 DIAGNOSIS — Z79899 Other long term (current) drug therapy: Secondary | ICD-10-CM

## 2013-07-21 DIAGNOSIS — R636 Underweight: Secondary | ICD-10-CM | POA: Diagnosis present

## 2013-07-21 DIAGNOSIS — Z681 Body mass index (BMI) 19 or less, adult: Secondary | ICD-10-CM

## 2013-07-21 DIAGNOSIS — I200 Unstable angina: Secondary | ICD-10-CM | POA: Diagnosis present

## 2013-07-21 DIAGNOSIS — Z8546 Personal history of malignant neoplasm of prostate: Secondary | ICD-10-CM

## 2013-07-21 DIAGNOSIS — I2510 Atherosclerotic heart disease of native coronary artery without angina pectoris: Secondary | ICD-10-CM | POA: Diagnosis present

## 2013-07-21 DIAGNOSIS — J4489 Other specified chronic obstructive pulmonary disease: Secondary | ICD-10-CM | POA: Diagnosis present

## 2013-07-21 HISTORY — PX: LEFT HEART CATHETERIZATION WITH CORONARY ANGIOGRAM: SHX5451

## 2013-07-21 LAB — BASIC METABOLIC PANEL
BUN: 15 mg/dL (ref 6–23)
CO2: 29 mEq/L (ref 19–32)
Calcium: 9.1 mg/dL (ref 8.4–10.5)
Creatinine, Ser: 1.04 mg/dL (ref 0.50–1.35)
GFR calc Af Amer: 76 mL/min — ABNORMAL LOW (ref >90)
GFR calc non Af Amer: 65 mL/min — ABNORMAL LOW (ref >90)
Glucose, Bld: 89 mg/dL (ref 70–99)
Sodium: 141 mEq/L (ref 135–145)

## 2013-07-21 LAB — CBC WITH DIFFERENTIAL/PLATELET
Eosinophils Absolute: 0 10*3/uL (ref 0.0–0.7)
Eosinophils Relative: 1 % (ref 0–5)
HCT: 32.9 % — ABNORMAL LOW (ref 39.0–52.0)
Lymphs Abs: 0.7 10*3/uL (ref 0.7–4.0)
MCH: 28.8 pg (ref 26.0–34.0)
MCHC: 33.7 g/dL (ref 30.0–36.0)
MCV: 85.2 fL (ref 78.0–100.0)
Monocytes Absolute: 0.6 10*3/uL (ref 0.1–1.0)
Monocytes Relative: 13 % — ABNORMAL HIGH (ref 3–12)
Neutro Abs: 3.6 10*3/uL (ref 1.7–7.7)
Platelets: 234 10*3/uL (ref 150–400)
RBC: 3.86 MIL/uL — ABNORMAL LOW (ref 4.22–5.81)
WBC: 4.9 10*3/uL (ref 4.0–10.5)

## 2013-07-21 LAB — TROPONIN I: Troponin I: <0.3 ng/mL (ref <0.30)

## 2013-07-21 SURGERY — LEFT HEART CATHETERIZATION WITH CORONARY ANGIOGRAM
Anesthesia: LOCAL

## 2013-07-21 MED ORDER — ASPIRIN 300 MG RE SUPP: 300.0000 mg | RECTAL | Status: DC

## 2013-07-21 MED ORDER — VERAPAMIL HCL 2.5 MG/ML IV SOLN
INTRAVENOUS | Status: AC
  Filled 2013-07-21: qty 2

## 2013-07-21 MED ORDER — POTASSIUM CHLORIDE CRYS ER 20 MEQ PO TBCR
20.0000 meq | EXTENDED_RELEASE_TABLET | ORAL | Status: AC
  Administered 2013-07-21 (×2): 20 meq via ORAL
  Filled 2013-07-21 (×2): qty 1

## 2013-07-21 MED ORDER — NITROGLYCERIN 2 % TD OINT
1.0000 [in_us] | TOPICAL_OINTMENT | Freq: Four times a day (QID) | TRANSDERMAL | Status: DC
  Administered 2013-07-21 – 2013-07-22 (×3): 1 [in_us] via TOPICAL
  Filled 2013-07-21: qty 30

## 2013-07-21 MED ORDER — FENTANYL CITRATE 0.05 MG/ML IJ SOLN
INTRAMUSCULAR | Status: AC
  Filled 2013-07-21: qty 2

## 2013-07-21 MED ORDER — METOPROLOL SUCCINATE ER 25 MG PO TB24
25.0000 mg | ORAL_TABLET | Freq: Every day | ORAL | Status: DC
  Administered 2013-07-22: 25 mg via ORAL
  Filled 2013-07-21 (×2): qty 1

## 2013-07-21 MED ORDER — PANTOPRAZOLE SODIUM 40 MG PO TBEC
40.0000 mg | DELAYED_RELEASE_TABLET | Freq: Every day | ORAL | Status: DC
  Administered 2013-07-22: 40 mg via ORAL
  Filled 2013-07-21: qty 1

## 2013-07-21 MED ORDER — NITROGLYCERIN 0.4 MG SL SUBL: 0.4000 mg | SUBLINGUAL_TABLET | SUBLINGUAL | Status: DC | PRN

## 2013-07-21 MED ORDER — FERROUS FUMARATE 325 (106 FE) MG PO TABS
1.0000 | ORAL_TABLET | Freq: Every day | ORAL | Status: DC
  Administered 2013-07-22: 106 mg via ORAL
  Filled 2013-07-21: qty 1

## 2013-07-21 MED ORDER — ALBUTEROL SULFATE (5 MG/ML) 0.5% IN NEBU
2.5000 mg | INHALATION_SOLUTION | Freq: Four times a day (QID) | RESPIRATORY_TRACT | Status: DC | PRN
  Administered 2013-07-22: 2.5 mg via RESPIRATORY_TRACT
  Filled 2013-07-21: qty 0.5

## 2013-07-21 MED ORDER — HEPARIN SODIUM (PORCINE) 1000 UNIT/ML IJ SOLN
INTRAMUSCULAR | Status: AC
  Filled 2013-07-21: qty 1

## 2013-07-21 MED ORDER — TICAGRELOR 90 MG PO TABS
90.0000 mg | ORAL_TABLET | Freq: Two times a day (BID) | ORAL | Status: DC
  Administered 2013-07-21 – 2013-07-22 (×2): 90 mg via ORAL
  Filled 2013-07-21 (×3): qty 1

## 2013-07-21 MED ORDER — LIDOCAINE HCL (PF) 1 % IJ SOLN
INTRAMUSCULAR | Status: AC
  Filled 2013-07-21: qty 30

## 2013-07-21 MED ORDER — ASPIRIN EC 81 MG PO TBEC
81.0000 mg | DELAYED_RELEASE_TABLET | Freq: Every day | ORAL | Status: DC
  Administered 2013-07-22: 81 mg via ORAL
  Filled 2013-07-21 (×2): qty 1

## 2013-07-21 MED ORDER — SODIUM CHLORIDE 0.9 % IJ SOLN: 3.0000 mL | Freq: Two times a day (BID) | INTRAMUSCULAR | Status: DC

## 2013-07-21 MED ORDER — BUDESONIDE-FORMOTEROL FUMARATE 160-4.5 MCG/ACT IN AERO
2.0000 | INHALATION_SPRAY | Freq: Two times a day (BID) | RESPIRATORY_TRACT | Status: DC
  Administered 2013-07-21 – 2013-07-22 (×2): 2 via RESPIRATORY_TRACT
  Filled 2013-07-21: qty 6

## 2013-07-21 MED ORDER — ACETAMINOPHEN 325 MG PO TABS: 650.0000 mg | ORAL_TABLET | ORAL | Status: DC | PRN

## 2013-07-21 MED ORDER — TIOTROPIUM BROMIDE MONOHYDRATE 18 MCG IN CAPS
18.0000 ug | ORAL_CAPSULE | Freq: Every day | RESPIRATORY_TRACT | Status: DC
  Administered 2013-07-22: 18 ug via RESPIRATORY_TRACT
  Filled 2013-07-21: qty 5

## 2013-07-21 MED ORDER — MIDAZOLAM HCL 2 MG/2ML IJ SOLN
INTRAMUSCULAR | Status: AC
  Filled 2013-07-21: qty 2

## 2013-07-21 MED ORDER — NITROGLYCERIN 0.2 MG/ML ON CALL CATH LAB
INTRAVENOUS | Status: AC
  Filled 2013-07-21: qty 1

## 2013-07-21 MED ORDER — SODIUM CHLORIDE 0.9 % IJ SOLN: 3.0000 mL | INTRAMUSCULAR | Status: DC | PRN

## 2013-07-21 MED ORDER — SODIUM CHLORIDE 0.9 % IV SOLN: 1.0000 mL/kg/h | INTRAVENOUS | Status: AC

## 2013-07-21 MED ORDER — HEPARIN BOLUS VIA INFUSION
2500.0000 [IU] | Freq: Once | INTRAVENOUS | Status: AC
  Administered 2013-07-21: 2500 [IU] via INTRAVENOUS
  Filled 2013-07-21: qty 2500

## 2013-07-21 MED ORDER — NITROGLYCERIN IN D5W 200-5 MCG/ML-% IV SOLN: 3.0000 ug/min | INTRAVENOUS | Status: DC

## 2013-07-21 MED ORDER — AMLODIPINE BESYLATE 10 MG PO TABS
10.0000 mg | ORAL_TABLET | Freq: Every day | ORAL | Status: DC
  Administered 2013-07-22: 10 mg via ORAL
  Filled 2013-07-21: qty 1

## 2013-07-21 MED ORDER — HEPARIN (PORCINE) IN NACL 2-0.9 UNIT/ML-% IJ SOLN
INTRAMUSCULAR | Status: AC
  Filled 2013-07-21: qty 1000

## 2013-07-21 MED ORDER — SODIUM CHLORIDE 0.9 % IV SOLN: 250.0000 mL | INTRAVENOUS | Status: DC | PRN

## 2013-07-21 MED ORDER — SODIUM CHLORIDE 0.9 % IV SOLN: INTRAVENOUS | Status: DC

## 2013-07-21 MED ORDER — SIMVASTATIN 20 MG PO TABS
20.0000 mg | ORAL_TABLET | Freq: Every day | ORAL | Status: DC
  Administered 2013-07-22: 20 mg via ORAL
  Filled 2013-07-21: qty 1

## 2013-07-21 MED ORDER — ALBUTEROL SULFATE HFA 108 (90 BASE) MCG/ACT IN AERS
2.0000 | INHALATION_SPRAY | Freq: Four times a day (QID) | RESPIRATORY_TRACT | Status: DC
  Administered 2013-07-21 – 2013-07-22 (×2): 2 via RESPIRATORY_TRACT
  Filled 2013-07-21: qty 6.7

## 2013-07-21 MED ORDER — ONDANSETRON HCL 4 MG/2ML IJ SOLN: 4.0000 mg | Freq: Four times a day (QID) | INTRAMUSCULAR | Status: DC | PRN

## 2013-07-21 MED ORDER — POTASSIUM CHLORIDE 10 MEQ/100ML IV SOLN
10.0000 meq | Freq: Once | INTRAVENOUS | Status: AC
  Administered 2013-07-21: 10 meq via INTRAVENOUS
  Filled 2013-07-21: qty 100

## 2013-07-21 MED ORDER — ASPIRIN 81 MG PO CHEW
324.0000 mg | CHEWABLE_TABLET | ORAL | Status: AC
  Filled 2013-07-21: qty 4

## 2013-07-21 MED ORDER — HEPARIN (PORCINE) IN NACL 100-0.45 UNIT/ML-% IJ SOLN
500.0000 [IU]/h | INTRAMUSCULAR | Status: DC
  Administered 2013-07-21: 500 [IU]/h via INTRAVENOUS
  Filled 2013-07-21: qty 250

## 2013-07-21 NOTE — Progress Notes (Signed)
Dr Jacinto Halim paged due to patient K 2.9. Awaiting return phone call. Levonne Spiller, RN

## 2013-07-21 NOTE — Interval H&P Note (Signed)
History and Physical Interval Note: Please note he was treated for bronchitis and not orchitis 4 weeks ago.  07/21/2013 4:37 PM  Cristian Collins  has presented today for surgery, with the diagnosis of Chest pain  The various methods of treatment have been discussed with the patient and family. After consideration of risks, benefits and other options for treatment, the patient has consented to  Procedure(s): LEFT HEART CATHETERIZATION WITH CORONARY ANGIOGRAM (N/A)  And possible PCI as a surgical intervention .  The patient's history has been reviewed, patient examined, no change in status, stable for surgery.  I have reviewed the patient's chart and labs.  Questions were answered to the patient's satisfaction.   Cath Lab Visit (complete for each Cath Lab visit)  Clinical Evaluation Leading to the Procedure:   ACS: yes  Non-ACS:    Anginal Classification: CCS IV  Anti-ischemic medical therapy: Maximal Therapy (2 or more classes of medications)  Non-Invasive Test Results: No non-invasive testing performed  Prior CABG: No previous CABG        [facility] R

## 2013-07-21 NOTE — Interval H&P Note (Deleted)
History and Physical Interval Note:  07/21/2013 4:35 PM  Cristian Collins  has presented today for surgery, with the diagnosis of Chest pain  The various methods of treatment have been discussed with the patient and family. After consideration of risks, benefits and other options for treatment, the patient has consented to  Procedure(s): LEFT HEART CATHETERIZATION WITH CORONARY ANGIOGRAM (N/A) possible PCI as a surgical intervention .  The patient's history has been reviewed, patient examined, no change in status, stable for surgery.  I have reviewed the patient's chart and labs.  Questions were answered to the patient's satisfaction.     Pamella Pert
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
## 2013-07-21 NOTE — CV Procedure (Signed)
Procedure performed:  Left heart catheterization including hemodynamic monitoring of the left ventricle, LV gram, selective right and left coronary arteriography.  Indication patient is a 79 year-old AA male with history of hypertension,  hyperlipidemia, CAD and h/o PTCA and stenting to OM branch of Cx on 03/10/2013. He presented with symptoms of [place] to the office and admitted directly. Due to ongoing chest pain was brought to the cardiac catheterization lab to evaluate the  coronary anatomy for definitive diagnosis of progression of CAD.  Hemodynamic data:  Left ventricular pressure was 145/5 with LVEDP of 10 mm mercury. Aortic pressure was 142/60 with a mean of 93 mm mercury. There was no pressure gradient across the aortic valve  Left ventricle: Performed in the RAO projection revealed LVEF of 70-80%%. There was No MR. No wall motion abnormality. .  Right coronary artery: The vessel is smooth, Dominant with mild diffuse luminal irregularity and mild diffuse calcification.  Left main coronary artery : Not existent. LAD and circumflex have separate ostia.   Circumflex coronary artery: A large vessel giving origin to a large obtuse marginal 1 and a small  To moderate sized OM 2. OM1 has OM1 with implantation of a 2.25 x 16 mm Promus  Placed on 03/10/2013 is widely patent. OM-2 has a 30-40%  irregularity in the proximal segment. Not changed from prior angiograms.  LAD: LAD gives origin to a large diagonal-1. LAD has diffuse mild luminal irregularities. D1 has proximal 40-50% stenosis with mild ectasia, large vessel. D2 distally is tiny and has 90% stenosis again unchanged from prior angiograms.   Impression: No lesions explain rest angina. I suspect GI etiology for his chest pain and will need evaluation for the same.    Technique: Under sterile precautions using a 6 French left  radial  arterial access, a 6 French sheath was introduced into the right radial artery. A 5 [place] Tig 4 catheter was  advanced into the ascending aorta selective  right coronary artery and left coronary artery was cannulated and angiography was performed in multiple views. The catheter was pulled back Out of the body over exchange length J-wire. Same Catheter was used to perform LV gram which was performed in RAO projection.  Catheter exchanged out of the body over J-Wire. NO immediate complications noted. Patient tolerated the procedure well. Hemostasis by TR band.  Rec: Medical therapy with aggressive risk factor reduction. GI etiology for chest pain. Home in AM. 50 cc contrast used.
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
## 2013-07-21 NOTE — Progress Notes (Addendum)
Pt arrived via EMS from [facility]. Pt having 8/10  central/left sided CP/pressure that radiating to back associated with SOB. Dr Nadara Eaton made aware. EKG with no acute changes. VSS. Pt placed on 2L o2. 1inch nitro paste placed on patients left upper chest. Pt remains NPO for cath later this afternoon.   3 nurses assessed for IV accesses. 3 failed attempts. Dr Nadara Eaton made aware of no access for heparin or IV nitro at this time. He plans to call critical care for central line. Will continue to monitor. Levonne Spiller, RN

## 2013-07-22 LAB — BASIC METABOLIC PANEL
BUN: 17 mg/dL (ref 6–23)
CO2: 24 mEq/L (ref 19–32)
Calcium: 8.5 mg/dL (ref 8.4–10.5)
Chloride: 102 mEq/L (ref 96–112)
Creatinine, Ser: 0.98 mg/dL (ref 0.50–1.35)
Glucose, Bld: 190 mg/dL — ABNORMAL HIGH (ref 70–99)
Potassium: 3.3 mEq/L — ABNORMAL LOW (ref 3.5–5.1)
Sodium: 136 mEq/L (ref 135–145)

## 2013-07-22 MED ORDER — ALBUTEROL SULFATE (5 MG/ML) 0.5% IN NEBU
2.5000 mg | INHALATION_SOLUTION | RESPIRATORY_TRACT | Status: DC | PRN
  Administered 2013-07-22: 2.5 mg via RESPIRATORY_TRACT
  Filled 2013-07-22: qty 0.5

## 2013-07-22 NOTE — Progress Notes (Signed)
Pt c/o shortness of breath, O2 sats 97% on RA, respirations 21, increase in crackles bilaterally. Respiratory therapist called to assess and nebulizer treatment frequency changed and given. Dr. Jacinto Halim notified, no changes at this time.   Pt states he "feels better after his nebulizer treatment"

## 2013-07-22 NOTE — Clinical Documentation Improvement (Signed)
THIS DOCUMENT IS NOT A PERMANENT PART OF THE MEDICAL RECORD  Please update your documentation with the medical record to reflect your response to this query. If you need help knowing how to do this please call [phone].  07/22/13   To Pamella Pert, MD / Associates,  In a better effort to capture your patient's severity of illness, reflect appropriate length of stay and utilization of resources, a review of the patient medical record has revealed the following indicators.    Based on your clinical judgment, please clarify and document in a progress note and/or discharge summary the clinical condition associated with the following supporting information:  In responding to this query please exercise your independent judgment.  The fact that a query is asked, does not imply that any particular answer is desired or expected.  Per nutrition consult "Severe malnutrition in the context of chronic illness/ Underweight " with BMI of 17.33.  Please document clinical condition if appropriate. Thank you    Possible Clinical Conditions?  Severe Malnutrition    Protein Calorie Malnutrition  Severe Protein Calorie Malnutrition  Underweight   Other Condition  Cannot clinically determine      Risk Factors: [place], h/o COPD, Prostate CA   Ht 5\' 4"  (1.626 m)    Wt 101 lb (45.813)   BMI:  17.33 kg/(m^2).       You may use possible, probable, or suspect with inpatient documentation. possible, probable, suspected diagnoses MUST be documented at the time of discharge  Reviewed: doc in d/c summary Thank You,  Leonette Most Eann Cleland  Clinical Documentation Specialist: [phone] [facility] Information [facility] [place]

## 2013-07-23 NOTE — Discharge Summary (Signed)
Physician Discharge Summary  Patient ID: Cristian Collins MRN: [id number] DOB/AGE: Nov 15, 1929 79 y.o.  Admit date: 07/21/2013 Discharge date: 07/22/2013  Primary Discharge Diagnosis Chest pain probably due to GERD and PUD. CAD, H/O PTCA Secondary Discharge Diagnosis Anemia of chronic disorder,Severe malnutrition in the context of chronic illness/ Underweight " with BMI of 17.33. COPD hyperlipidemia Significant Diagnostic Studies: 07/21/2013:  Hemodynamic data:  Left ventricular pressure was 145/5 with LVEDP of 10 mm mercury. Aortic pressure was 142/60 with a mean of 93 mm mercury. There was no pressure gradient across the aortic valve  Left ventricle: Performed in the RAO projection revealed LVEF of 70-80%%. There was No MR. No wall motion abnormality.  .  Right coronary artery: The vessel is smooth, Dominant with mild diffuse luminal irregularity and mild diffuse calcification.  Left main coronary artery : Not existent. LAD and circumflex have separate ostia.  Circumflex coronary artery: A large vessel giving origin to a large obtuse marginal 1 and a small To moderate sized OM 2. OM1 has OM1 with implantation of a 2.25 x 16 mm Promus Placed on 03/10/2013 is widely patent. OM-2 has a 30-40% irregularity in the proximal segment. Not changed from prior angiograms.  LAD: LAD gives origin to a large diagonal-1. LAD has diffuse mild luminal irregularities. D1 has proximal 40-50% stenosis with mild ectasia, large vessel. D2 distally is tiny and has 90% stenosis again unchanged from prior angiograms.  Impression: No lesions explain rest angina. I suspect GI etiology for his chest pain and will need evaluation for the same.   Hospital Course:  patient was initially evaluated in our office on the day of hospitalization when he presented with chest pain suggestive of angina,  patient using frequent sublingual nitroglycerin and having chest pain.  He was admitted to the hospital, underwent coronary  angiography the same day and was found to have no significant progression of coronary disease and widely patent previously placed stent.  The following morning patient was felt stable for discharge.  During the night he did have 2 episodes of shortness of breath, but has underlying COPD and do not suspect that he has pulmonary edema.  He responded to albuterol inhaler.  He was felt stable for discharge with outpatient follow-up.  Arrangements will be made for GI evaluation if she persist to have chest discomfort. Due to recent worsening shortness of breath, consider repeat pulmonary evaluation.  Patient had seen Dr. Sandrea Hughs in the past.   Discharge Exam: Blood pressure 156/66, pulse 77, temperature 97.9 F (36.6 C), temperature source Oral, resp. rate 18, height 5\' 4"  (1.626 m), weight 45.813 kg (101 lb), SpO2 98.00%.   General: Moderately build and petite body habitus who is in no acute distress. Appears stated age. Alert Ox3.  There is no cyanosis. HEENT: normal limits. PERRLA, No JVD.  CARDIAC EXAM: S1, S2 normal, no gallop present. No murmur.  CHEST EXAM: No tenderness of chest wall. LUNGS: Bilateral diffuse scattered expiratory wheeze and scattered crackles.  ABDOMEN: No hepatosplenomegaly. BS normal in all 4 quadrants. Abdomen is non-tender.  EXTREMITY: Full range of movementes, No edema. MUSCULOSKELETAL EXAM: Intact with full range of motion in all 4 extremities.  NEUROLOGIC EXAM: Grossly intact without any focal deficits. Alert O x 3. Uses a cane to walk due to gait imbalance.  VASCULAR EXAM: No skin breakdown. Carotids bilateral soft bruit present. Extremities: Femoral pulse normal. Popliteal pulse normal ; Pedal pulse normal. Otherwise No prominent pulse felt in the abdomen. No varicose  veins. Left t radial access normal. Labs:   Lab Results  Component Value Date   WBC 4.9 07/21/2013   HGB 11.1* 07/21/2013   HCT 32.9* 07/21/2013   MCV 85.2 07/21/2013   PLT 234 07/21/2013     Recent Labs Lab 07/22/13 0245  NA 136  K 3.3*  CL 102  CO2 24  BUN 17  CREATININE 0.98  CALCIUM 8.5  GLUCOSE 190*   Lab Results  Component Value Date   CKTOTAL 440* 11/23/2010   CKMB 7.3 CRITICAL VALUE NOTED.  VALUE IS CONSISTENT WITH PREVIOUSLY REPORTED AND CALLED VALUE.* 11/23/2010   TROPONINI <0.30 07/22/2013    Lipid Panel     Component Value Date/Time   CHOL  Value: 166        ATP III CLASSIFICATION:  <200     mg/dL   Desirable  161-096  mg/dL   Borderline High  >=045    mg/dL   High        11/18/8117 2029   TRIG 52 11/22/2010 2029   HDL 58 11/22/2010 2029   CHOLHDL 2.9 11/22/2010 2029   VLDL 10 11/22/2010 2029   LDLCALC  Value: 98        Total Cholesterol/HDL:CHD Risk Coronary Heart Disease Risk Table                     Men   Women  1/2 Average Risk   3.4   3.3  Average Risk       5.0   4.4  2 X Average Risk   9.6   7.1  3 X Average Risk  23.4   11.0        Use the calculated Patient Ratio above and the CHD Risk Table to determine the patient's CHD Risk.        ATP III CLASSIFICATION (LDL):  <100     mg/dL   Optimal  147-829  mg/dL   Near or Above                    Optimal  130-159  mg/dL   Borderline  562-130  mg/dL   High  >865     mg/dL   Very High 7/84/6962 9528    EKG: NSR, normal intervals. No ischemia.   FOLLOW UP PLANS AND APPOINTMENTS    Medication List         amLODipine 10 MG tablet  Commonly known as:  NORVASC  Take 10 mg by mouth daily.     aspirin EC 81 MG tablet  Take 81 mg by mouth daily.     budesonide-formoterol 160-4.5 MCG/ACT inhaler  Commonly known as:  SYMBICORT  Inhale 2 puffs into the lungs 2 (two) times daily.     ergocalciferol 50000 UNITS capsule  Commonly known as:  VITAMIN D2  Take 50,000 Units by mouth 2 (two) times a week. On Mon & Thrus     ferrous fumarate 325 (106 FE) MG Tabs tablet  Commonly known as:  HEMOCYTE - 106 mg FE  Take 1 tablet by mouth daily.     isosorbide mononitrate 60 MG 24 hr tablet  Commonly known as:   IMDUR  Take 60 mg by mouth daily.     metoprolol succinate 25 MG 24 hr tablet  Commonly known as:  TOPROL-XL  Take 25 mg by mouth daily.     nitroGLYCERIN 0.4 MG SL tablet  Commonly known as:  NITROSTAT  Place 0.4  mg under the tongue every 5 (five) minutes as needed for chest pain. For chest pain     pantoprazole 40 MG tablet  Commonly known as:  PROTONIX  Take 40 mg by mouth daily.     PROAIR HFA 108 (90 BASE) MCG/ACT inhaler  Generic drug:  albuterol  Inhale 2 puffs into the lungs 4 (four) times daily. For shortness of breath     albuterol (2.5 MG/3ML) 0.083% nebulizer solution  Commonly known as:  PROVENTIL  Take 2.5 mg by nebulization every 4 (four) hours as needed for shortness of breath. For shortness of breath     simvastatin 20 MG tablet  Commonly known as:  ZOCOR  Take 20 mg by mouth daily.     Ticagrelor 90 MG Tabs tablet  Commonly known as:  BRILINTA  Take 1 tablet (90 mg total) by mouth 2 (two) times daily.     tiotropium 18 MCG inhalation capsule  Commonly known as:  SPIRIVA  Place 18 mcg into inhaler and inhale daily.     VITAMIN B-12 IJ  Inject 1,000 mcg as directed every 3 (three) months.          Pamella Pert, MD 07/23/2013, 7:34 AM  Pager: [phone] Office: [phone] If no answer: [phone]

## 2013-11-27 ENCOUNTER — Emergency Department (HOSPITAL_COMMUNITY): Payer: Medicare Other

## 2013-11-27 ENCOUNTER — Inpatient Hospital Stay (HOSPITAL_COMMUNITY)
Admission: EM | Admit: 2013-11-27 | Discharge: 2013-12-06 | DRG: 190 | Disposition: A | Discharge status: Home Health | Payer: Medicare Other | Attending: Internal Medicine | Admitting: Internal Medicine

## 2013-11-27 ENCOUNTER — Encounter (HOSPITAL_COMMUNITY): Payer: Self-pay | Admitting: Emergency Medicine

## 2013-11-27 DIAGNOSIS — Z9861 Coronary angioplasty status: Secondary | ICD-10-CM

## 2013-11-27 DIAGNOSIS — E785 Hyperlipidemia, unspecified: Secondary | ICD-10-CM | POA: Diagnosis present

## 2013-11-27 DIAGNOSIS — R64 Cachexia: Secondary | ICD-10-CM | POA: Diagnosis present

## 2013-11-27 DIAGNOSIS — Z823 Family history of stroke: Secondary | ICD-10-CM

## 2013-11-27 DIAGNOSIS — Z7982 Long term (current) use of aspirin: Secondary | ICD-10-CM

## 2013-11-27 DIAGNOSIS — Z833 Family history of diabetes mellitus: Secondary | ICD-10-CM

## 2013-11-27 DIAGNOSIS — Z681 Body mass index (BMI) 19 or less, adult: Secondary | ICD-10-CM

## 2013-11-27 DIAGNOSIS — Z8249 Family history of ischemic heart disease and other diseases of the circulatory system: Secondary | ICD-10-CM

## 2013-11-27 DIAGNOSIS — Z7901 Long term (current) use of anticoagulants: Secondary | ICD-10-CM

## 2013-11-27 DIAGNOSIS — E43 Unspecified severe protein-calorie malnutrition: Secondary | ICD-10-CM | POA: Diagnosis present

## 2013-11-27 DIAGNOSIS — I10 Essential (primary) hypertension: Secondary | ICD-10-CM | POA: Diagnosis present

## 2013-11-27 DIAGNOSIS — J449 Chronic obstructive pulmonary disease, unspecified: Secondary | ICD-10-CM

## 2013-11-27 DIAGNOSIS — D72829 Elevated white blood cell count, unspecified: Secondary | ICD-10-CM | POA: Diagnosis present

## 2013-11-27 DIAGNOSIS — J9600 Acute respiratory failure, unspecified whether with hypoxia or hypercapnia: Secondary | ICD-10-CM | POA: Diagnosis present

## 2013-11-27 DIAGNOSIS — M1710 Unilateral primary osteoarthritis, unspecified knee: Secondary | ICD-10-CM | POA: Diagnosis present

## 2013-11-27 DIAGNOSIS — E876 Hypokalemia: Secondary | ICD-10-CM | POA: Diagnosis not present

## 2013-11-27 DIAGNOSIS — I200 Unstable angina: Secondary | ICD-10-CM

## 2013-11-27 DIAGNOSIS — J441 Chronic obstructive pulmonary disease with (acute) exacerbation: Principal | ICD-10-CM | POA: Diagnosis present

## 2013-11-27 DIAGNOSIS — J45901 Unspecified asthma with (acute) exacerbation: Principal | ICD-10-CM

## 2013-11-27 DIAGNOSIS — Z87891 Personal history of nicotine dependence: Secondary | ICD-10-CM

## 2013-11-27 DIAGNOSIS — I2510 Atherosclerotic heart disease of native coronary artery without angina pectoris: Secondary | ICD-10-CM | POA: Diagnosis present

## 2013-11-27 DIAGNOSIS — R911 Solitary pulmonary nodule: Secondary | ICD-10-CM

## 2013-11-27 DIAGNOSIS — Z803 Family history of malignant neoplasm of breast: Secondary | ICD-10-CM

## 2013-11-27 DIAGNOSIS — K219 Gastro-esophageal reflux disease without esophagitis: Secondary | ICD-10-CM | POA: Diagnosis present

## 2013-11-27 DIAGNOSIS — Z8546 Personal history of malignant neoplasm of prostate: Secondary | ICD-10-CM

## 2013-11-27 HISTORY — DX: Disease of blood and blood-forming organs, unspecified: D75.9

## 2013-11-27 HISTORY — DX: Peripheral vascular disease, unspecified: I73.9

## 2013-11-27 HISTORY — DX: Unspecified asthma, uncomplicated: J45.909

## 2013-11-27 HISTORY — DX: Heart failure, unspecified: I50.9

## 2013-11-27 HISTORY — DX: Gastro-esophageal reflux disease without esophagitis: K21.9

## 2013-11-27 HISTORY — DX: Angina pectoris, unspecified: I20.9

## 2013-11-27 HISTORY — DX: Personal history of other diseases of the digestive system: Z87.19

## 2013-11-27 LAB — PRO B NATRIURETIC PEPTIDE: Pro B Natriuretic peptide (BNP): 287 pg/mL (ref 0–450)

## 2013-11-27 LAB — CBC
HCT: 31.3 % — ABNORMAL LOW (ref 39.0–52.0)
HEMOGLOBIN: 10.6 g/dL — AB (ref 13.0–17.0)
MCH: 28.3 pg (ref 26.0–34.0)
MCHC: 33.9 g/dL (ref 30.0–36.0)
MCV: 83.5 fL (ref 78.0–100.0)
Platelets: 213 10*3/uL (ref 150–400)
RBC: 3.75 MIL/uL — AB (ref 4.22–5.81)
RDW: 14.3 % (ref 11.5–15.5)
WBC: 6.5 10*3/uL (ref 4.0–10.5)

## 2013-11-27 LAB — BASIC METABOLIC PANEL
BUN: 13 mg/dL (ref 6–23)
CO2: 23 mEq/L (ref 19–32)
Calcium: 9.2 mg/dL (ref 8.4–10.5)
Chloride: 99 mEq/L (ref 96–112)
Creatinine, Ser: 0.9 mg/dL (ref 0.50–1.35)
GFR calc Af Amer: >90 mL/min (ref >90)
GFR, EST NON AFRICAN AMERICAN: 78 mL/min — AB (ref >90)
GLUCOSE: 95 mg/dL (ref 70–99)
Potassium: 3.3 mEq/L — ABNORMAL LOW (ref 3.7–5.3)
SODIUM: 137 meq/L (ref 137–147)

## 2013-11-27 LAB — TROPONIN I

## 2013-11-27 LAB — I-STAT TROPONIN, ED: TROPONIN I, POC: 0.01 ng/mL (ref 0.00–0.08)

## 2013-11-27 MED ORDER — TIOTROPIUM BROMIDE MONOHYDRATE 18 MCG IN CAPS
18.0000 ug | ORAL_CAPSULE | Freq: Every day | RESPIRATORY_TRACT | Status: DC
Start: 2013-11-27 — End: 2013-11-27

## 2013-11-27 MED ORDER — ALBUTEROL SULFATE (2.5 MG/3ML) 0.083% IN NEBU
5.0000 mg | INHALATION_SOLUTION | Freq: Once | RESPIRATORY_TRACT | Status: AC
  Administered 2013-11-27: 5 mg via RESPIRATORY_TRACT
  Filled 2013-11-27: qty 6

## 2013-11-27 MED ORDER — ASPIRIN 325 MG PO TABS
325.0000 mg | ORAL_TABLET | ORAL | Status: AC
  Administered 2013-11-27: 325 mg via ORAL
  Filled 2013-11-27: qty 1

## 2013-11-27 MED ORDER — METHYLPREDNISOLONE SODIUM SUCC 40 MG IJ SOLR
40.0000 mg | Freq: Two times a day (BID) | INTRAMUSCULAR | Status: DC
  Administered 2013-11-28 – 2013-11-30 (×7): 40 mg via INTRAVENOUS
  Filled 2013-11-27 (×8): qty 1

## 2013-11-27 MED ORDER — ALBUTEROL SULFATE (2.5 MG/3ML) 0.083% IN NEBU
2.5000 mg | INHALATION_SOLUTION | Freq: Four times a day (QID) | RESPIRATORY_TRACT | Status: DC
Start: 2013-11-27 — End: 2013-11-27
  Administered 2013-11-27: 2.5 mg via RESPIRATORY_TRACT
  Filled 2013-11-27: qty 3

## 2013-11-27 MED ORDER — HEPARIN SODIUM (PORCINE) 5000 UNIT/ML IJ SOLN
5000.0000 [IU] | Freq: Three times a day (TID) | INTRAMUSCULAR | Status: DC
  Administered 2013-11-27 – 2013-12-06 (×26): 5000 [IU] via SUBCUTANEOUS
  Filled 2013-11-27 (×29): qty 1

## 2013-11-27 MED ORDER — ALBUTEROL SULFATE (2.5 MG/3ML) 0.083% IN NEBU
2.5000 mg | INHALATION_SOLUTION | Freq: Three times a day (TID) | RESPIRATORY_TRACT | Status: DC
Start: 2013-11-28 — End: 2013-11-28
  Administered 2013-11-28 (×2): 2.5 mg via RESPIRATORY_TRACT
  Filled 2013-11-27 (×2): qty 3

## 2013-11-27 MED ORDER — ACETAMINOPHEN 325 MG PO TABS
650.0000 mg | ORAL_TABLET | Freq: Four times a day (QID) | ORAL | Status: DC | PRN
  Administered 2013-11-28 – 2013-11-30 (×6): 650 mg via ORAL
  Filled 2013-11-27 (×7): qty 2

## 2013-11-27 MED ORDER — SODIUM CHLORIDE 0.9 % IJ SOLN
3.0000 mL | INTRAMUSCULAR | Status: DC | PRN
  Administered 2013-12-01: 3 mL via INTRAVENOUS

## 2013-11-27 MED ORDER — ALBUTEROL (5 MG/ML) CONTINUOUS INHALATION SOLN
10.0000 mg/h | INHALATION_SOLUTION | Freq: Once | RESPIRATORY_TRACT | Status: AC
  Administered 2013-11-27: 10 mg/h via RESPIRATORY_TRACT
  Filled 2013-11-27: qty 20

## 2013-11-27 MED ORDER — ASPIRIN EC 81 MG PO TBEC
81.0000 mg | DELAYED_RELEASE_TABLET | Freq: Every day | ORAL | Status: DC
  Administered 2013-11-28 – 2013-12-06 (×9): 81 mg via ORAL
  Filled 2013-11-27 (×9): qty 1

## 2013-11-27 MED ORDER — TICAGRELOR 90 MG PO TABS
90.0000 mg | ORAL_TABLET | Freq: Two times a day (BID) | ORAL | Status: DC
  Administered 2013-11-27 – 2013-12-06 (×18): 90 mg via ORAL
  Filled 2013-11-27 (×20): qty 1

## 2013-11-27 MED ORDER — PANTOPRAZOLE SODIUM 40 MG PO TBEC
40.0000 mg | DELAYED_RELEASE_TABLET | Freq: Every day | ORAL | Status: DC
  Administered 2013-11-28 – 2013-11-29 (×2): 40 mg via ORAL
  Filled 2013-11-27 (×2): qty 1

## 2013-11-27 MED ORDER — SODIUM CHLORIDE 0.9 % IV SOLN: 250.0000 mL | INTRAVENOUS | Status: DC | PRN

## 2013-11-27 MED ORDER — SIMVASTATIN 20 MG PO TABS
20.0000 mg | ORAL_TABLET | Freq: Every day | ORAL | Status: DC
  Administered 2013-11-28 – 2013-12-06 (×9): 20 mg via ORAL
  Filled 2013-11-27 (×9): qty 1

## 2013-11-27 MED ORDER — ALBUTEROL SULFATE (2.5 MG/3ML) 0.083% IN NEBU
5.0000 mg | INHALATION_SOLUTION | Freq: Once | RESPIRATORY_TRACT | Status: DC
Start: 2013-11-27 — End: 2013-11-27
  Filled 2013-11-27: qty 6

## 2013-11-27 MED ORDER — ALBUTEROL SULFATE (2.5 MG/3ML) 0.083% IN NEBU
2.5000 mg | INHALATION_SOLUTION | RESPIRATORY_TRACT | Status: DC | PRN
Start: 2013-11-27 — End: 2013-11-27

## 2013-11-27 MED ORDER — POLYETHYLENE GLYCOL 3350 17 G PO PACK
17.0000 g | PACK | Freq: Every day | ORAL | Status: DC | PRN
  Filled 2013-11-27: qty 1

## 2013-11-27 MED ORDER — AMLODIPINE BESYLATE 10 MG PO TABS
10.0000 mg | ORAL_TABLET | Freq: Every day | ORAL | Status: DC
  Administered 2013-11-28 – 2013-12-06 (×9): 10 mg via ORAL
  Filled 2013-11-27 (×9): qty 1

## 2013-11-27 MED ORDER — ONDANSETRON HCL 4 MG PO TABS: 4.0000 mg | ORAL_TABLET | Freq: Four times a day (QID) | ORAL | Status: DC | PRN

## 2013-11-27 MED ORDER — FERROUS FUMARATE 325 (106 FE) MG PO TABS
1.0000 | ORAL_TABLET | Freq: Every day | ORAL | Status: DC
  Administered 2013-11-27 – 2013-12-06 (×10): 106 mg via ORAL
  Filled 2013-11-27 (×10): qty 1

## 2013-11-27 MED ORDER — SODIUM CHLORIDE 0.9 % IJ SOLN
3.0000 mL | Freq: Two times a day (BID) | INTRAMUSCULAR | Status: DC
  Administered 2013-11-28 – 2013-12-06 (×14): 3 mL via INTRAVENOUS

## 2013-11-27 MED ORDER — METHYLPREDNISOLONE SODIUM SUCC 125 MG IJ SOLR
125.0000 mg | INTRAMUSCULAR | Status: AC
  Administered 2013-11-27: 125 mg via INTRAVENOUS
  Filled 2013-11-27: qty 2

## 2013-11-27 MED ORDER — DOXYCYCLINE HYCLATE 100 MG IV SOLR
100.0000 mg | Freq: Two times a day (BID) | INTRAVENOUS | Status: DC
  Administered 2013-11-28 – 2013-12-01 (×8): 100 mg via INTRAVENOUS
  Filled 2013-11-27 (×9): qty 100

## 2013-11-27 MED ORDER — TIOTROPIUM BROMIDE MONOHYDRATE 18 MCG IN CAPS
18.0000 ug | ORAL_CAPSULE | Freq: Every day | RESPIRATORY_TRACT | Status: DC
  Administered 2013-11-28 – 2013-12-06 (×8): 18 ug via RESPIRATORY_TRACT
  Filled 2013-11-27 (×2): qty 5

## 2013-11-27 MED ORDER — METOPROLOL SUCCINATE ER 25 MG PO TB24
25.0000 mg | ORAL_TABLET | Freq: Every day | ORAL | Status: DC
  Administered 2013-11-28 – 2013-12-06 (×9): 25 mg via ORAL
  Filled 2013-11-27 (×9): qty 1

## 2013-11-27 MED ORDER — ALBUTEROL SULFATE HFA 108 (90 BASE) MCG/ACT IN AERS: 2.0000 | INHALATION_SPRAY | Freq: Four times a day (QID) | RESPIRATORY_TRACT | Status: DC

## 2013-11-27 MED ORDER — NITROGLYCERIN 0.4 MG SL SUBL
0.4000 mg | SUBLINGUAL_TABLET | SUBLINGUAL | Status: DC | PRN
  Administered 2013-11-29 – 2013-12-05 (×10): 0.4 mg via SUBLINGUAL
  Filled 2013-11-27 (×6): qty 1

## 2013-11-27 MED ORDER — ISOSORBIDE MONONITRATE ER 60 MG PO TB24
60.0000 mg | ORAL_TABLET | Freq: Every day | ORAL | Status: DC
  Administered 2013-11-28 – 2013-12-06 (×9): 60 mg via ORAL
  Filled 2013-11-27 (×9): qty 1

## 2013-11-27 MED ORDER — ACETAMINOPHEN 650 MG RE SUPP: 650.0000 mg | Freq: Four times a day (QID) | RECTAL | Status: DC | PRN

## 2013-11-27 MED ORDER — ALBUTEROL SULFATE (2.5 MG/3ML) 0.083% IN NEBU
5.0000 mg | INHALATION_SOLUTION | RESPIRATORY_TRACT | Status: AC | PRN
  Administered 2013-11-28: 5 mg via RESPIRATORY_TRACT
  Filled 2013-11-27: qty 6

## 2013-11-27 MED ORDER — ONDANSETRON HCL 4 MG/2ML IJ SOLN: 4.0000 mg | Freq: Four times a day (QID) | INTRAMUSCULAR | Status: DC | PRN

## 2013-11-27 NOTE — ED Notes (Signed)
Per pt sts chest pain and SOB for the past few days but worsening today. Pt speaking in short sentences. sts hx of cardiac stent.
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
## 2013-11-27 NOTE — ED Notes (Signed)
MD at bedside. 

## 2013-11-27 NOTE — ED Notes (Signed)
Hour long neb in progress
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
## 2013-11-28 ENCOUNTER — Inpatient Hospital Stay (HOSPITAL_COMMUNITY): Payer: Medicare Other

## 2013-11-28 ENCOUNTER — Encounter (HOSPITAL_COMMUNITY): Payer: Self-pay | Admitting: Radiology

## 2013-11-28 DIAGNOSIS — R911 Solitary pulmonary nodule: Secondary | ICD-10-CM

## 2013-11-28 LAB — COMPREHENSIVE METABOLIC PANEL
ALBUMIN: 3 g/dL — AB (ref 3.5–5.2)
ALT: 11 U/L (ref 0–53)
AST: 20 U/L (ref 0–37)
Alkaline Phosphatase: 108 U/L (ref 39–117)
BILIRUBIN TOTAL: 0.3 mg/dL (ref 0.3–1.2)
BUN: 16 mg/dL (ref 6–23)
CHLORIDE: 100 meq/L (ref 96–112)
CO2: 23 meq/L (ref 19–32)
Calcium: 9.3 mg/dL (ref 8.4–10.5)
Creatinine, Ser: 0.8 mg/dL (ref 0.50–1.35)
GFR calc Af Amer: >90 mL/min (ref >90)
GFR, EST NON AFRICAN AMERICAN: 82 mL/min — AB (ref >90)
Glucose, Bld: 227 mg/dL — ABNORMAL HIGH (ref 70–99)
POTASSIUM: 3.9 meq/L (ref 3.7–5.3)
SODIUM: 138 meq/L (ref 137–147)
Total Protein: 6.6 g/dL (ref 6.0–8.3)

## 2013-11-28 LAB — CBC
HCT: 30.6 % — ABNORMAL LOW (ref 39.0–52.0)
Hemoglobin: 10.3 g/dL — ABNORMAL LOW (ref 13.0–17.0)
MCH: 28.1 pg (ref 26.0–34.0)
MCHC: 33.7 g/dL (ref 30.0–36.0)
MCV: 83.6 fL (ref 78.0–100.0)
PLATELETS: 255 10*3/uL (ref 150–400)
RBC: 3.66 MIL/uL — AB (ref 4.22–5.81)
RDW: 14.3 % (ref 11.5–15.5)
WBC: 9.1 10*3/uL (ref 4.0–10.5)

## 2013-11-28 LAB — TROPONIN I
Troponin I: <0.3 ng/mL (ref <0.30)
Troponin I: <0.3 ng/mL (ref <0.30)

## 2013-11-28 MED ORDER — ALBUTEROL SULFATE (2.5 MG/3ML) 0.083% IN NEBU
2.5000 mg | INHALATION_SOLUTION | Freq: Four times a day (QID) | RESPIRATORY_TRACT | Status: DC
  Administered 2013-11-28 – 2013-12-04 (×22): 2.5 mg via RESPIRATORY_TRACT
  Filled 2013-11-28 (×21): qty 3

## 2013-11-28 MED ORDER — IOHEXOL 300 MG/ML  SOLN
80.0000 mL | Freq: Once | INTRAMUSCULAR | Status: AC | PRN
  Administered 2013-11-28: 80 mL via INTRAVENOUS

## 2013-11-28 NOTE — Evaluation (Signed)
Physical Therapy Evaluation Patient Details Name: Cristian Collins MRN: [id number] DOB: 01-08-1930 Today's Date: 11/28/2013   History of Present Illness  The past medical history of COPD not oxygen dependent that comes in for shortness and chest tightness for the past 4 days prior to admission progressively getting worse. He relates productive cough yellow in color no fevers, no sick contacts no recent travel. No nausea vomiting or diarrhea. He relates in a good day he can walk about 200 feet without being short of breath now he can even walk to the bathroom without getting short of breath.  Clinical Impression  Pt admitted with above. Pt currently with functional limitations due to the deficits listed below (see PT Problem List).  Pt will benefit from skilled PT to increase their independence and safety with mobility to allow discharge to the venue listed below.       Follow Up Recommendations Home health PT;Supervision/Assistance - 24 hour    Equipment Recommendations  None recommended by PT    Recommendations for Other Services OT consult     Precautions / Restrictions Precautions Precautions: Fall Precaution Comments: Reports R knee gives way at times while walking      Mobility  Bed Mobility Overal bed mobility: Modified Independent                Transfers Overall transfer level: Needs assistance Equipment used: Rolling walker (2 wheeled) Transfers: Sit to/from Stand Sit to Stand: Supervision         General transfer comment: Stood quite quickly from bed; Noted some unsteadiness which pt noted as well, and used cane for regaining balance appropriately  Ambulation/Gait Ambulation/Gait assistance: Min guard Ambulation Distance (Feet): 45 Feet Assistive device: Straight cane Gait Pattern/deviations: Step-through pattern Gait velocity: decr   General Gait Details: No overt loss of balance during amb, but heavily dependent on cane; Cues to self-monitor for  activity tolerance and to perform pursed-lip breathing as we noted DOE 3/4  Stairs            Wheelchair Mobility    Modified Rankin (Stroke Patients Only)       Balance Overall balance assessment: Needs assistance   Sitting balance-Leahy Scale: Good     Standing balance support: Single extremity supported Standing balance-Leahy Scale: Fair Standing balance comment: Noted dependence on cane for balance during activity                             Pertinent Vitals/Pain Session conducted on 2L O2, and sats remained greater than or equal to 97%    Home Living Family/patient expects to be discharged to:: Private residence Living Arrangements: Spouse/significant other Available Help at Discharge: Family;Available 24 hours/day Type of Home: House Home Access: Level entry (3 steps in the back)     Home Layout: One level Home Equipment: Walker - 2 wheels;Cane - single point      Prior Function Level of Independence: Independent with assistive device(s)         Comments: walks  a block a day with cane (when the weather is good)     Hand Dominance        Extremity/Trunk Assessment   Upper Extremity Assessment: Overall WFL for tasks assessed           Lower Extremity Assessment: Generalized weakness         Communication   Communication: No difficulties  Cognition Arousal/Alertness: Awake/alert Behavior During Therapy: [facility]  for tasks assessed/performed Overall Cognitive Status: Within Functional Limits for tasks assessed                      General Comments      Exercises        Assessment/Plan    PT Assessment Patient needs continued PT services  PT Diagnosis Difficulty walking;Generalized weakness   PT Problem List Decreased strength;Decreased activity tolerance;Decreased balance;Decreased mobility;Decreased knowledge of use of DME;Decreased knowledge of precautions;Cardiopulmonary status limiting activity  PT  Treatment Interventions DME instruction;Gait training;Stair training;Functional mobility training;Therapeutic activities;Therapeutic exercise;Balance training;Patient/family education   PT Goals (Current goals can be found in the Care Plan section) Acute Rehab PT Goals Patient Stated Goal: back to normal PT Goal Formulation: With patient Time For Goal Achievement: 12/12/13 Potential to Achieve Goals: Good    Frequency Min 3X/week   Barriers to discharge        Co-evaluation               End of Session Equipment Utilized During Treatment: Gait belt;Oxygen Activity Tolerance: Patient tolerated treatment well Patient left: in chair;with call bell/phone within reach;Other (comment) (setup at sink for sponge bath, at Nurse Tech's request) Nurse Communication: Mobility status         Time: [phone] PT Time Calculation (min): 29 min   Charges:   PT Evaluation $Initial PT Evaluation Tier I: 1 Procedure PT Treatments $Gait Training: 8-22 mins $Therapeutic Activity: 8-22 mins   PT G Codes:          [facility] [place] 11/28/2013, 10:44 AM Roney Marion, PT  Acute Rehabilitation Services Pager [phone] Office [phone]

## 2013-11-28 NOTE — Progress Notes (Signed)
[facility] PROGRESS NOTE  Cristian Collins KGM:[id number] DOB: 1929-09-09 DOA: 11/27/2013 PCP: Salena Saner., MD  Assessment/Plan:  Acute respiratory failure due to COPD exacerbation with leukocytosis:  - continue Solu-Medrol, IV antibiotics continue Spiriva and albuterol when necessary.  - increase scheduled nebsCont O2,   - Chest x-ray showed a lung nodule and a CT of the chest with contrast.   -remains with no leukocytosis Protein-calorie malnutrition, severe  - will cons Nutrition today  Essential hypertension:  - improving BP control, continue metoprolol and Norvasc. History of CAD status post PCI 07/22/2013:  - on admission He related this chest discomfort is current currently having is different from the previous one when he had his stent placed.  -admit EKG showed a sinus rhythm with a probable right ventricular hypertrophy deviation and right atrial enlargement. -Troponins neg, he is CP free this am -continue Brilinta, zocor, metoprolol, imdur  Lung Nodule -new area 12x76mm partially spiculated nodularity -follow up CT chest recommended in 3-79mos  Code Status: full Family Communication: wife at bedside Disposition Plan: to home when medically ready   Consultants:  NONE  Procedures:  NONE  Antibiotics:  Doxycycline started on 4/18   HPI/Subjective: Still with SOB this am and cough  Objective: Filed Vitals:   11/28/13 1457  BP: 172/77  Pulse: 81  Temp: 97.8 F (36.6 C)  Resp: 18    Intake/Output Summary (Last 24 hours) at 11/28/13 1812 Last data filed at 11/28/13 1742  Gross per 24 hour  Intake   1220 ml  Output   1900 ml  Net   -680 ml   Filed Weights   11/27/13 1737  Weight: 43.727 kg (96 lb 6.4 oz)    Exam:  General: alert & oriented x 3 In NAD Cardiovascular: RRR, nl S1 s2 Respiratory: bil wheezes, L>R Abdomen: soft +BS NT/ND, no masses palpable Extremities: No cyanosis and no edema    Data Reviewed: Basic Metabolic  Panel:  Recent Labs Lab 11/27/13 1405 11/28/13 0840  NA 137 138  K 3.3* 3.9  CL 99 100  CO2 23 23  GLUCOSE 95 227*  BUN 13 16  CREATININE 0.90 0.80  CALCIUM 9.2 9.3   Liver Function Tests:  Recent Labs Lab 11/28/13 0840  AST 20  ALT 11  ALKPHOS 108  BILITOT 0.3  PROT 6.6  ALBUMIN 3.0*   No results found for this basename: LIPASE, AMYLASE,  in the last 168 hours No results found for this basename: AMMONIA,  in the last 168 hours CBC:  Recent Labs Lab 11/27/13 1405 11/28/13 0840  WBC 6.5 9.1  HGB 10.6* 10.3*  HCT 31.3* 30.6*  MCV 83.5 83.6  PLT 213 255   Cardiac Enzymes:  Recent Labs Lab 11/27/13 2050 11/28/13 0840 11/28/13 0927  TROPONINI <0.30 <0.30 <0.30   BNP (last 3 results)  Recent Labs  11/27/13 1405  PROBNP 287.0   CBG: No results found for this basename: GLUCAP,  in the last 168 hours  No results found for this or any previous visit (from the past 240 hour(s)).   Studies: Dg Chest 2 View  11/27/2013   CLINICAL DATA:  Cough and shortness of breath.  History of COPD.  EXAM: CHEST  2 VIEW  COMPARISON:  Chest x-ray 08/22/2012.  FINDINGS: Emphysematous changes are again noted throughout the lungs bilaterally. No acute consolidative airspace disease. No pleural effusions. Patchy linear opacities throughout the periphery of the left mid to lower lung, favored to be areas of mild  scarring, although one of these is slightly nodular in appearance and new compared to the prior study projecting over the anterior aspect of the left sixth rib. No evidence of pulmonary edema. Heart size is normal. Mediastinal contours are unremarkable. Atherosclerotic calcifications in the thoracic aorta. Surgical clips at the gastroesophageal junction.  IMPRESSION: 1. No definite radiographic evidence of acute cardiopulmonary disease. 2. Small nodular opacity in the left mid lung, likely within the lingula. Although this may simply represent an area of evolving scarring, this  is new compared to the prior examination, and a small neoplasm is difficult to exclude. Given the background of smoking related changes in the lungs, further evaluation with nonemergent chest CT in the near future is suggested to exclude a small neoplasm. 3. Atherosclerosis.   Electronically Signed   By: Vinnie Langton M.D.   On: 11/27/2013 14:42   Ct Chest W Contrast  11/28/2013   CLINICAL DATA:  80 year old male with lung nodule suspected on chest x-ray. Shortness of breath. Chronic obstructed pulmonary disease.  EXAM: CT CHEST WITH CONTRAST  TECHNIQUE: Multidetector CT imaging of the chest was performed during intravenous contrast administration.  CONTRAST:  86mL OMNIPAQUE IOHEXOL 300 MG/ML  SOLN  COMPARISON:  Chest radiographs 11/27/2013.  Chest CTA 09/20/2009.  FINDINGS: Since the prior CTA, in the right upper lobe there is a new 12 x 5 mm nodule located along a curvilinear area of scarring (series 302, image 14). In the anterior inferior aspect of the upper lobe there is an area of ground-glass opacity plus tree-in-bud nodularity and distal bronchiectasis (image 30). There is chronic pleural scarring along the dome of the right diaphragm. There is mild subpleural scarring in the right lower lobe. There is mild tree-in-bud nodularity in the lateral right lower lobe posterior to the major fissure (image 28).  There is a new 2 mm nodule in the left upper lobe on image 30. There is stable tree-in-bud nodularity in the lateral basal segment of the left lower lobe on image 42. This corresponds to the area questioned radiographically.  Underlying widespread emphysema and bronchiectasis which has mildly progressed since 2011. Major airways are patent. Small volume of retained secretions in the right posterior lateral trachea on image 16.  No pericardial or pleural effusion. No mediastinal lymphadenopathy. Negative thoracic inlet. Stable surgical clips around the gastroesophageal junction. Chronic atherosclerosis,  including involvement of the coronary arteries. Chronic small benign appearing low-density area in the left hepatic lobe (58) such as due to cyst or biliary hamartoma. Otherwise negative visualized upper abdominal viscera.  No acute osseous abnormality identified.  IMPRESSION: 1. Small bilateral areas of distal airway infection/inflammation. That in the left lower lobe lateral basal segment corresponding to the area of nodularity questioned on the earlier radiograph. 2. Progressed scarring in the right upper lobe since 2011, with a new area of 12 x 5 mm partially spiculated nodularity. Followup chest CT at 3-6 months is recommended. This recommendation follows the consensus statement: Guidelines for Management of Small Pulmonary Nodules Detected on CT Scans: A Statement from the [employer] as published in Radiology 2005; 237:395-400. 3. Chronic emphysema and bronchiectasis, mildly progressed since 2011.   Electronically Signed   By: Lars Pinks M.D.   On: 11/28/2013 09:14    Scheduled Meds: . albuterol  2.5 mg Nebulization TID  . amLODipine  10 mg Oral Daily  . aspirin EC  81 mg Oral Daily  . doxycycline (VIBRAMYCIN) IV  100 mg Intravenous Q12H  . ferrous fumarate  1 tablet Oral Daily  . heparin  5,000 Units Subcutaneous 3 times per day  . isosorbide mononitrate  60 mg Oral Daily  . methylPREDNISolone (SOLU-MEDROL) injection  40 mg Intravenous Q12H  . metoprolol succinate  25 mg Oral Daily  . pantoprazole  40 mg Oral Daily  . simvastatin  20 mg Oral Daily  . sodium chloride  3 mL Intravenous Q12H  . ticagrelor  90 mg Oral BID  . tiotropium  18 mcg Inhalation Daily   Continuous Infusions:   Active Problems:   COPD exacerbation   Protein-calorie malnutrition, severe   Acute respiratory failure   Essential hypertension    Time spent: [facility] Pager [phone]. If 7PM-7AM, please contact night-coverage at [email or web address], password [facility] 11/28/2013,  6:12 PM  LOS: 1 day

## 2013-11-28 NOTE — Progress Notes (Signed)
Pt with complaints of pain to L anterior fa, IV noted to be infiltrated while infusing doxycycline post return from CT. Pt reports "the IV blew downstairs and they put a new one in". Pt was found reconnected to the old IV that had already infiltrated and not the new IV that was placed. L fa IV removed, placement of heat pack per pharmacist. Continued monitoring of site for changes. Swelling diminished and no hardness noted. Pt reports decrease in pain to site. Will continue monitoring for any changes. Tanish Sinkler Candelaria Stagers, RN

## 2013-11-29 LAB — TROPONIN I: Troponin I: <0.3 ng/mL (ref <0.30)

## 2013-11-29 MED ORDER — ENSURE COMPLETE PO LIQD
237.0000 mL | Freq: Three times a day (TID) | ORAL | Status: DC
  Administered 2013-11-29 – 2013-12-06 (×19): 237 mL via ORAL

## 2013-11-29 MED ORDER — PANTOPRAZOLE SODIUM 40 MG PO TBEC
40.0000 mg | DELAYED_RELEASE_TABLET | Freq: Two times a day (BID) | ORAL | Status: DC
  Administered 2013-11-29 – 2013-12-06 (×14): 40 mg via ORAL
  Filled 2013-11-29 (×12): qty 1

## 2013-11-29 NOTE — Progress Notes (Signed)
UR completed. IP ordered on 11/27/2013. Jonnie Finner RN CCM Case Mgmt phone [phone]
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
## 2013-11-29 NOTE — Progress Notes (Signed)
Physical Therapy Treatment Patient Details Name: Cristian Collins MRN: [id number] DOB: January 27, 1930 Today's Date: 11/29/2013    History of Present Illness The past medical history of COPD not oxygen dependent that comes in for shortness and chest tightness for the past 4 days prior to admission progressively getting worse. He relates productive cough yellow in color no fevers, no sick contacts no recent travel. No nausea vomiting or diarrhea. He relates in a good day he can walk about 200 feet without being short of breath now he can even walk to the bathroom without getting short of breath.    PT Comments    Continuing progress with functional mobility; Able to walk quite a further distance with less need for rest breaks; )2 sat walking qualification done; see other PT note of this date  Follow Up Recommendations  Home health PT;Supervision/Assistance - 24 hour     Equipment Recommendations  None recommended by PT    Recommendations for Other Services       Precautions / Restrictions Precautions Precautions: Fall Precaution Comments: Reports R knee gives way at times while walking    Mobility  Bed Mobility                  Transfers Overall transfer level: Needs assistance Equipment used: Rolling walker (2 wheeled) Transfers: Sit to/from Stand Sit to Stand: Supervision         General transfer comment: less unsteadiness today  Ambulation/Gait Ambulation/Gait assistance: Supervision Ambulation Distance (Feet): 200 Feet Assistive device: Straight cane (and second hand pushing dinamap at times)   Gait velocity: decr   General Gait Details: Occasional standing rest breaks; Cues for controlled pursed lip breathing; Did not note R knee giving way during today's walk   Stairs            Wheelchair Mobility    Modified Rankin (Stroke Patients Only)       Balance                                    Cognition Arousal/Alertness:  Awake/alert Behavior During Therapy: WFL for tasks assessed/performed Overall Cognitive Status: Within Functional Limits for tasks assessed                      Exercises      General Comments        Pertinent Vitals/Pain See other PT note of this date    Home Living                      Prior Function            PT Goals (current goals can now be found in the care plan section) Acute Rehab PT Goals Patient Stated Goal: back to normal PT Goal Formulation: With patient Time For Goal Achievement: 12/12/13 Potential to Achieve Goals: Good Progress towards PT goals: Progressing toward goals    Frequency  Min 3X/week    PT Plan Current plan remains appropriate    Co-evaluation             End of Session Equipment Utilized During Treatment: Gait belt;Oxygen Activity Tolerance: Patient tolerated treatment well Patient left: in chair;with call bell/phone within reach;with family/visitor present     Time: 3810-1751 PT Time Calculation (min): 23 min  Charges:  $Gait Training: 23-37 mins  G Codes:      [facility] 11/29/2013, 4:46 PM Roney Marion, [place] Pager [phone] Office [phone]

## 2013-11-29 NOTE — Progress Notes (Signed)
Physical Therapy Treatment Note   SATURATION QUALIFICATIONS: (This note is used to comply with regulatory documentation for home oxygen)  Patient Saturations on Room Air at Rest = 90-94%  Patient Saturations on Room Air while Ambulating = 84% (observed lowest)  Patient Saturations on 2 Liters of oxygen while Ambulating = 92-96%  Please briefly explain why patient needs home oxygen: Pt requires supplemental O2 to keep oxygen saturation at safe acceptable levels with activity  (See also full PT note of this date)  Roney Marion, [place] Pager [phone] Office [phone]

## 2013-11-29 NOTE — Progress Notes (Signed)
Inpatient Diabetes Program Recommendations  AACE/ADA: New Consensus Statement on Inpatient Glycemic Control (2013)  Target Ranges:  Prepandial:   less than 140 mg/dL      Peak postprandial:   less than 180 mg/dL (1-2 hours)      Critically ill patients:  140 - 180 mg/dL   Reason for Assessment: Hyperglycemia and on steroids  Diabetes history: No history found Outpatient Diabetes medications: N/A Current orders for Inpatient glycemic control: None  Results for MARDY, LUCIER (MRN [id number]) as of 11/29/2013 16:30  Ref. Range 11/27/2013 14:05 11/28/2013 08:40  Glucose Latest Range: 70-99 mg/dL 95 227 (H)   Note: Patient receiving SoluMedrol 40 mg every 12 hours IV.  Lab glucose yesterday morning elevated to 227 mg/dl.  Please complete the Glycemic Control Order Set and consider ordering cbgs, A1C, etc.  Thank you.  Rhena Glace S. Marcelline Mates, RN, MSN, CDE Inpatient Diabetes Program, team pager [phone]

## 2013-11-29 NOTE — Progress Notes (Signed)
[facility] PROGRESS NOTE  Cristian Collins WNU:[id number] DOB: 1930-03-20 DOA: 11/27/2013 PCP: Salena Saner., MD  Assessment/Plan:  Acute respiratory failure due to COPD exacerbation with leukocytosis:  - continue Solu-Medrol(increase PPI), continueIV antibiotics continue Spiriva  - continue scheduled nebsCont O2,   - Chest x-ray showed a lung nodule and a CT of the chest with contrast.   -remains with no leukocytosis Protein-calorie malnutrition, severe  - nutritional supplements, follow Essential hypertension:  - improving BP control, continue metoprolol and Norvasc. History of CAD status post PCI 07/22/2013:  - on admission He related this chest discomfort is current currently having is different from the previous one when he had his stent placed.  -admit EKG showed a sinus rhythm with a probable right ventricular hypertrophy deviation and right atrial enlargement. -Troponins neg x3,  -continue Brilinta, zocor, metoprolol, imdur  Lung Nodule -new area 12x77mm partially spiculated nodularity -follow up CT chest recommended in 3-63mos  Code Status: full Family Communication: wife at bedside Disposition Plan: to home when medically ready   Consultants:  NONE  Procedures:  NONE  Antibiotics:  Doxycycline started on 4/18   HPI/Subjective: C/o CP this am, reports breathing better, but states not back to baseline  Objective: Filed Vitals:   11/29/13 1639  BP: 136/58  Pulse: 64  Temp: 97.4 F (36.3 C)  Resp: 18    Intake/Output Summary (Last 24 hours) at 11/29/13 1856 Last data filed at 11/29/13 1402  Gross per 24 hour  Intake    730 ml  Output   1300 ml  Net   -570 ml   Filed Weights   11/27/13 1737  Weight: 43.727 kg (96 lb 6.4 oz)    Exam:  General: alert & oriented x 3 In NAD Cardiovascular: RRR, nl S1 s2 Respiratory: decreasing bil wheezes, no crackles Abdomen: soft +BS NT/ND, no masses palpable Extremities: No cyanosis and no  edema    Data Reviewed: Basic Metabolic Panel:  Recent Labs Lab 11/27/13 1405 11/28/13 0840  NA 137 138  K 3.3* 3.9  CL 99 100  CO2 23 23  GLUCOSE 95 227*  BUN 13 16  CREATININE 0.90 0.80  CALCIUM 9.2 9.3   Liver Function Tests:  Recent Labs Lab 11/28/13 0840  AST 20  ALT 11  ALKPHOS 108  BILITOT 0.3  PROT 6.6  ALBUMIN 3.0*   No results found for this basename: LIPASE, AMYLASE,  in the last 168 hours No results found for this basename: AMMONIA,  in the last 168 hours CBC:  Recent Labs Lab 11/27/13 1405 11/28/13 0840  WBC 6.5 9.1  HGB 10.6* 10.3*  HCT 31.3* 30.6*  MCV 83.5 83.6  PLT 213 255   Cardiac Enzymes:  Recent Labs Lab 11/27/13 2050 11/28/13 0840 11/28/13 0927  TROPONINI <0.30 <0.30 <0.30   BNP (last 3 results)  Recent Labs  11/27/13 1405  PROBNP 287.0   CBG: No results found for this basename: GLUCAP,  in the last 168 hours  No results found for this or any previous visit (from the past 240 hour(s)).   Studies: Ct Chest W Contrast  11/28/2013   CLINICAL DATA:  80 year old male with lung nodule suspected on chest x-ray. Shortness of breath. Chronic obstructed pulmonary disease.  EXAM: CT CHEST WITH CONTRAST  TECHNIQUE: Multidetector CT imaging of the chest was performed during intravenous contrast administration.  CONTRAST:  24mL OMNIPAQUE IOHEXOL 300 MG/ML  SOLN  COMPARISON:  Chest radiographs 11/27/2013.  Chest CTA 09/20/2009.  FINDINGS: Since  the prior CTA, in the right upper lobe there is a new 12 x 5 mm nodule located along a curvilinear area of scarring (series 302, image 14). In the anterior inferior aspect of the upper lobe there is an area of ground-glass opacity plus tree-in-bud nodularity and distal bronchiectasis (image 30). There is chronic pleural scarring along the dome of the right diaphragm. There is mild subpleural scarring in the right lower lobe. There is mild tree-in-bud nodularity in the lateral right lower lobe  posterior to the major fissure (image 28).  There is a new 2 mm nodule in the left upper lobe on image 30. There is stable tree-in-bud nodularity in the lateral basal segment of the left lower lobe on image 42. This corresponds to the area questioned radiographically.  Underlying widespread emphysema and bronchiectasis which has mildly progressed since 2011. Major airways are patent. Small volume of retained secretions in the right posterior lateral trachea on image 16.  No pericardial or pleural effusion. No mediastinal lymphadenopathy. Negative thoracic inlet. Stable surgical clips around the gastroesophageal junction. Chronic atherosclerosis, including involvement of the coronary arteries. Chronic small benign appearing low-density area in the left hepatic lobe (58) such as due to cyst or biliary hamartoma. Otherwise negative visualized upper abdominal viscera.  No acute osseous abnormality identified.  IMPRESSION: 1. Small bilateral areas of distal airway infection/inflammation. That in the left lower lobe lateral basal segment corresponding to the area of nodularity questioned on the earlier radiograph. 2. Progressed scarring in the right upper lobe since 2011, with a new area of 12 x 5 mm partially spiculated nodularity. Followup chest CT at 3-6 months is recommended. This recommendation follows the consensus statement: Guidelines for Management of Small Pulmonary Nodules Detected on CT Scans: A Statement from the [place] as published in Radiology 2005; 237:395-400. 3. Chronic emphysema and bronchiectasis, mildly progressed since 2011.   Electronically Signed   By: Lars Pinks M.D.   On: 11/28/2013 09:14    Scheduled Meds: . albuterol  2.5 mg Nebulization QID  . amLODipine  10 mg Oral Daily  . aspirin EC  81 mg Oral Daily  . doxycycline (VIBRAMYCIN) IV  100 mg Intravenous Q12H  . feeding supplement (ENSURE COMPLETE)  237 mL Oral TID BM  . ferrous fumarate  1 tablet Oral Daily  . heparin   5,000 Units Subcutaneous 3 times per day  . isosorbide mononitrate  60 mg Oral Daily  . methylPREDNISolone (SOLU-MEDROL) injection  40 mg Intravenous Q12H  . metoprolol succinate  25 mg Oral Daily  . pantoprazole  40 mg Oral Daily  . simvastatin  20 mg Oral Daily  . sodium chloride  3 mL Intravenous Q12H  . ticagrelor  90 mg Oral BID  . tiotropium  18 mcg Inhalation Daily   Continuous Infusions:   Active Problems:   COPD exacerbation   Protein-calorie malnutrition, severe   Acute respiratory failure   Essential hypertension    Time spent: [place] [facility] Pager [phone]. If 7PM-7AM, please contact night-coverage at [email or web address], password [facility] 11/29/2013, 6:56 PM  LOS: 2 days

## 2013-11-30 LAB — BASIC METABOLIC PANEL
BUN: 32 mg/dL — ABNORMAL HIGH (ref 6–23)
CALCIUM: 9 mg/dL (ref 8.4–10.5)
CHLORIDE: 106 meq/L (ref 96–112)
CO2: 22 meq/L (ref 19–32)
CREATININE: 0.91 mg/dL (ref 0.50–1.35)
GFR calc Af Amer: 90 mL/min — ABNORMAL LOW (ref >90)
GFR calc non Af Amer: 77 mL/min — ABNORMAL LOW (ref >90)
GLUCOSE: 131 mg/dL — AB (ref 70–99)
Potassium: 4.2 mEq/L (ref 3.7–5.3)
Sodium: 139 mEq/L (ref 137–147)

## 2013-11-30 MED ORDER — METHYLPREDNISOLONE SODIUM SUCC 40 MG IJ SOLR
40.0000 mg | Freq: Once | INTRAMUSCULAR | Status: AC
  Administered 2013-11-30: 40 mg via INTRAVENOUS
  Filled 2013-11-30: qty 1

## 2013-11-30 MED ORDER — METHYLPREDNISOLONE SODIUM SUCC 125 MG IJ SOLR
60.0000 mg | Freq: Two times a day (BID) | INTRAMUSCULAR | Status: DC
  Administered 2013-12-01 – 2013-12-02 (×3): 60 mg via INTRAVENOUS
  Filled 2013-11-30 (×5): qty 0.96

## 2013-11-30 NOTE — Progress Notes (Signed)
[facility] PROGRESS NOTE  Cristian Collins EUM:[id number] DOB: 25-Dec-1929 DOA: 11/27/2013 PCP: Salena Saner., MD  Assessment/Plan:  Acute respiratory failure due to COPD exacerbation with leukocytosis:  - Will increase Solu-Medrol, continueIV antibiotics continue Spiriva  - continue scheduled nebsCont O2,   - Chest x-ray showed a lung nodule and a CT of the chest with contrast.  -remains with no leukocytosis -PT Mobilizing Protein-calorie malnutrition, severe  - nutritional supplements, follow Essential hypertension:  - improving BP control, continue metoprolol and Norvasc. History of CAD status post PCI 07/22/2013:  - on admission He related this chest discomfort is current currently having is different from the previous one when he had his stent placed.  -admit EKG showed a sinus rhythm with a probable right ventricular hypertrophy deviation and right atrial enlargement. -Troponins neg x3,  -continue Brilinta, zocor, metoprolol, imdur  Lung Nodule -new area 12x76mm partially spiculated nodularity -follow up CT chest recommended in 3-35mos  Code Status: full Family Communication: wife at bedside Disposition Plan: to home when medically ready   Consultants:  NONE  Procedures:  NONE  Antibiotics:  Doxycycline started on 4/18   HPI/Subjective: C/o Pleuritic pain still short of breath with minimal activity.  Objective: Filed Vitals:   11/30/13 1714  BP: 148/64  Pulse: 73  Temp: 97.5 F (36.4 C)  Resp: 18    Intake/Output Summary (Last 24 hours) at 11/30/13 1734 Last data filed at 11/30/13 1300  Gross per 24 hour  Intake    480 ml  Output   1252 ml  Net   -772 ml   Filed Weights   11/27/13 1737  Weight: 43.727 kg (96 lb 6.4 oz)    Exam:  General: alert & oriented x 3 In NAD Cardiovascular: RRR, nl S1 s2 Respiratory: bil wheezes R>L, no crackles Abdomen: soft +BS NT/ND, no masses palpable Extremities: No cyanosis and no edema    Data  Reviewed: Basic Metabolic Panel:  Recent Labs Lab 11/27/13 1405 11/28/13 0840 11/30/13 0420  NA 137 138 139  K 3.3* 3.9 4.2  CL 99 100 106  CO2 23 23 22   GLUCOSE 95 227* 131*  BUN 13 16 32*  CREATININE 0.90 0.80 0.91  CALCIUM 9.2 9.3 9.0   Liver Function Tests:  Recent Labs Lab 11/28/13 0840  AST 20  ALT 11  ALKPHOS 108  BILITOT 0.3  PROT 6.6  ALBUMIN 3.0*   No results found for this basename: LIPASE, AMYLASE,  in the last 168 hours No results found for this basename: AMMONIA,  in the last 168 hours CBC:  Recent Labs Lab 11/27/13 1405 11/28/13 0840  WBC 6.5 9.1  HGB 10.6* 10.3*  HCT 31.3* 30.6*  MCV 83.5 83.6  PLT 213 255   Cardiac Enzymes:  Recent Labs Lab 11/27/13 2050 11/28/13 0840 11/28/13 0927 11/29/13 2119  TROPONINI <0.30 <0.30 <0.30 <0.30   BNP (last 3 results)  Recent Labs  11/27/13 1405  PROBNP 287.0   CBG: No results found for this basename: GLUCAP,  in the last 168 hours  No results found for this or any previous visit (from the past 240 hour(s)).   Studies: No results found.  Scheduled Meds: . albuterol  2.5 mg Nebulization QID  . amLODipine  10 mg Oral Daily  . aspirin EC  81 mg Oral Daily  . doxycycline (VIBRAMYCIN) IV  100 mg Intravenous Q12H  . feeding supplement (ENSURE COMPLETE)  237 mL Oral TID BM  . ferrous fumarate  1 tablet  Oral Daily  . heparin  5,000 Units Subcutaneous 3 times per day  . isosorbide mononitrate  60 mg Oral Daily  . methylPREDNISolone (SOLU-MEDROL) injection  40 mg Intravenous Q12H  . metoprolol succinate  25 mg Oral Daily  . pantoprazole  40 mg Oral BID AC  . simvastatin  20 mg Oral Daily  . sodium chloride  3 mL Intravenous Q12H  . ticagrelor  90 mg Oral BID  . tiotropium  18 mcg Inhalation Daily   Continuous Infusions:   Active Problems:   COPD exacerbation   Protein-calorie malnutrition, severe   Acute respiratory failure   Essential hypertension    Time spent: [place] [facility] Pager [phone]. If 7PM-7AM, please contact night-coverage at [email or web address], password [facility] 11/30/2013, 5:34 PM  LOS: 3 days

## 2013-12-01 ENCOUNTER — Inpatient Hospital Stay (HOSPITAL_COMMUNITY): Payer: Medicare Other

## 2013-12-01 DIAGNOSIS — J449 Chronic obstructive pulmonary disease, unspecified: Secondary | ICD-10-CM

## 2013-12-01 DIAGNOSIS — J4489 Other specified chronic obstructive pulmonary disease: Secondary | ICD-10-CM

## 2013-12-01 DIAGNOSIS — J9600 Acute respiratory failure, unspecified whether with hypoxia or hypercapnia: Secondary | ICD-10-CM

## 2013-12-01 MED ORDER — DOXYCYCLINE HYCLATE 100 MG PO TABS
100.0000 mg | ORAL_TABLET | Freq: Two times a day (BID) | ORAL | Status: DC
  Administered 2013-12-01 – 2013-12-06 (×10): 100 mg via ORAL
  Filled 2013-12-01 (×13): qty 1

## 2013-12-01 MED ORDER — FUROSEMIDE 10 MG/ML IJ SOLN
20.0000 mg | Freq: Once | INTRAMUSCULAR | Status: AC
  Administered 2013-12-01: 20 mg via INTRAVENOUS

## 2013-12-01 MED ORDER — FUROSEMIDE 10 MG/ML IJ SOLN
INTRAMUSCULAR | Status: AC
  Administered 2013-12-01: 20 mg
  Filled 2013-12-01: qty 4

## 2013-12-01 NOTE — Progress Notes (Signed)
Physical Therapy Treatment Patient Details Name: FELICE DEEM MRN: [id number] DOB: 21-Feb-1930 Today's Date: 12/01/2013    History of Present Illness The past medical history of COPD not oxygen dependent that comes in for shortness and chest tightness for the past 4 days prior to admission progressively getting worse. He relates productive cough yellow in color no fevers, no sick contacts no recent travel. No nausea vomiting or diarrhea. He relates in a good day he can walk about 200 feet without being short of breath now he can even walk to the bathroom without getting short of breath.    PT Comments    Continuing progress with functional mobility; Much less need for supplemental O2 today, with oxygen saturations greater than or equal to 90% on Room Air  OK for dc home from PT standpoint   Follow Up Recommendations  Home health PT;Supervision/Assistance - 24 hour     Equipment Recommendations  None recommended by PT    Recommendations for Other Services       Precautions / Restrictions Precautions Precautions: Fall Precaution Comments: Reports R knee gives way at times while walking    Mobility  Bed Mobility Overal bed mobility: Modified Independent                Transfers Overall transfer level: Needs assistance Equipment used: Rolling walker (2 wheeled) Transfers: Sit to/from Stand Sit to Stand: Supervision         General transfer comment: less unsteadiness today; continuing improvements  Ambulation/Gait Ambulation/Gait assistance: Supervision Ambulation Distance (Feet): 200 Feet Assistive device: Straight cane Gait Pattern/deviations: Step-through pattern Gait velocity: decr   General Gait Details: One standing rest break; Noted dyspneic at end of walk; When asked, reported he felt like his knee was stable; Cues for controlled pursed lip breathing   Stairs            Wheelchair Mobility    Modified Rankin (Stroke Patients Only)       Balance                                    Cognition Arousal/Alertness: Awake/alert Behavior During Therapy: WFL for tasks assessed/performed Overall Cognitive Status: Within Functional Limits for tasks assessed                      Exercises      General Comments        Pertinent Vitals/Pain Session conducted on Room Air O2 sats remained greater than or equal to 90%    Home Living                      Prior Function            PT Goals (current goals can now be found in the care plan section) Acute Rehab PT Goals Patient Stated Goal: back to normal PT Goal Formulation: With patient Time For Goal Achievement: 12/12/13 Potential to Achieve Goals: Good Progress towards PT goals: Progressing toward goals    Frequency  Min 3X/week    PT Plan Current plan remains appropriate    Co-evaluation             End of Session Equipment Utilized During Treatment: Gait belt;Oxygen Activity Tolerance: Patient tolerated treatment well Patient left: in chair;with call bell/phone within reach;with family/visitor present     Time: 2229-7989 PT Time Calculation (min): 30 min  Charges:  $Gait Training: 23-37 mins                    G Codes:      Mila Homer Lula 12/01/2013, 2:50 PM Roney Marion, [place] Pager [phone] Office [phone]

## 2013-12-01 NOTE — Progress Notes (Signed)
Patient Demographics  Cristian Collins, is a 80 y.o. male, DOB - October 30, 1929, DGL:[id number]  Admit date - 11/27/2013   Admitting Physician Cristian Cousins, MD  Outpatient Primary MD for the patient is Cristian Collins., MD  LOS - 4   Chief Complaint  Patient presents with  . Chest Pain  . Shortness of Breath        Assessment & Plan    Acute respiratory failure due to COPD exacerbation with leukocytosis:  - Improved with IV Solu-Medrol which will be continued, continue nebulizer treatments, currently requiring oxygen which will be continued likely will be discharged home on home oxygen. Continue doxycycline for atypical coverage. He will require outpatient follow with pulmonary secondary to underlying COPD and now lung nodule which was noted on CT scan.    Protein-calorie malnutrition, severe  - nutritional supplements, follow     Essential hypertension:  - improving BP control, continue metoprolol and Norvasc.     History of CAD status post PCI 07/22/2013:  - on admission He related this chest discomfort is current currently having is different from the previous one when he had his stent placed.  -admit EKG was nonacute,Troponins neg x3, continue Brilinta, zocor, metoprolol, imdur. He is chest pain-free. We'll check echogram to evaluate wall motion and to estimate his EF.    Lung Nodule  -new area 12x60mm partially spiculated nodularity  -follow up CT chest recommended in 3-8mos along with outpatient pulmonary followup      Code Status: Full  Family Communication: None present  Disposition Plan: Home   Procedures  CT chest   Consults    Medications  Scheduled Meds: . albuterol  2.5 mg Nebulization QID  . amLODipine  10 mg Oral Daily  . aspirin EC  81 mg Oral  Daily  . doxycycline (VIBRAMYCIN) IV  100 mg Intravenous Q12H  . feeding supplement (ENSURE COMPLETE)  237 mL Oral TID BM  . ferrous fumarate  1 tablet Oral Daily  . heparin  5,000 Units Subcutaneous 3 times per day  . isosorbide mononitrate  60 mg Oral Daily  . methylPREDNISolone (SOLU-MEDROL) injection  60 mg Intravenous Q12H  . metoprolol succinate  25 mg Oral Daily  . pantoprazole  40 mg Oral BID AC  . simvastatin  20 mg Oral Daily  . sodium chloride  3 mL Intravenous Q12H  . ticagrelor  90 mg Oral BID  . tiotropium  18 mcg Inhalation Daily   Continuous Infusions:  PRN Meds:.sodium chloride, acetaminophen, acetaminophen, nitroGLYCERIN, ondansetron (ZOFRAN) IV, ondansetron, polyethylene glycol, sodium chloride  DVT Prophylaxis   Heparin   Lab Results  Component Value Date   PLT 255 11/28/2013    Antibiotics     Anti-infectives   Start     Dose/Rate Route Frequency Ordered Stop   11/27/13 1800  doxycycline (VIBRAMYCIN) 100 mg in dextrose 5 % 250 mL IVPB     100 mg 125 mL/hr over 120 Minutes Intravenous Every 12 hours 11/27/13 1725            Subjective:   Cristian Collins today has, No headache, No chest pain, No abdominal pain - No Nausea, No new weakness tingling or numbness, improved Cough - SOB.    Objective:  Filed Vitals:   12/01/13 0512 12/01/13 0730 12/01/13 0820 12/01/13 1030  BP: 163/61 151/65 147/59 153/54  Pulse: 69 72 78   Temp: 97.6 F (36.4 C) 97.8 F (36.6 C)  97.8 F (36.6 C)  TempSrc: Oral Oral  Oral  Resp: 18  18 19   Height:      Weight:      SpO2: 95% 98% 97% 99%    Wt Readings from Last 3 Encounters:  11/30/13 47.537 kg (104 lb 12.8 oz)  07/21/13 45.813 kg (101 lb)  07/21/13 45.813 kg (101 lb)     Intake/Output Summary (Last 24 hours) at 12/01/13 1145 Last data filed at 11/30/13 2300  Gross per 24 hour  Intake   1240 ml  Output      2 ml  Net   1238 ml     Physical Exam  Awake Alert, Oriented X 3, No new F.N deficits,  Normal affect Cristian Collins.AT,PERRAL Supple Neck,No JVD, No cervical lymphadenopathy appriciated.  Symmetrical Chest wall movement, Good air movement bilaterally, coarse bilateral breath sounds with some wheezing RRR,No Gallops,Rubs or new Murmurs, No Parasternal Heave +ve B.Sounds, Abd Soft, Non tender, No organomegaly appriciated, No rebound - guarding or rigidity. No Cyanosis, Clubbing or edema, No new Rash or bruise     Data Review   Micro Results No results found for this or any previous visit (from the past 240 hour(s)).  Radiology Reports Dg Chest 2 View  11/27/2013   CLINICAL DATA:  Cough and shortness of breath.  History of COPD.  EXAM: CHEST  2 VIEW  COMPARISON:  Chest x-ray 08/22/2012.  FINDINGS: Emphysematous changes are again noted throughout the lungs bilaterally. No acute consolidative airspace disease. No pleural effusions. Patchy linear opacities throughout the periphery of the left mid to lower lung, favored to be areas of mild scarring, although one of these is slightly nodular in appearance and new compared to the prior study projecting over the anterior aspect of the left sixth rib. No evidence of pulmonary edema. Heart size is normal. Mediastinal contours are unremarkable. Atherosclerotic calcifications in the thoracic aorta. Surgical clips at the gastroesophageal junction.  IMPRESSION: 1. No definite radiographic evidence of acute cardiopulmonary disease. 2. Small nodular opacity in the left mid lung, likely within the lingula. Although this may simply represent an area of evolving scarring, this is new compared to the prior examination, and a small neoplasm is difficult to exclude. Given the background of smoking related changes in the lungs, further evaluation with nonemergent chest CT in the near future is suggested to exclude a small neoplasm. 3. Atherosclerosis.   Electronically Signed   By: Vinnie Langton M.D.   On: 11/27/2013 14:42   Ct Chest W Contrast  11/28/2013   CLINICAL  DATA:  80 year old male with lung nodule suspected on chest x-ray. Shortness of breath. Chronic obstructed pulmonary disease.  EXAM: CT CHEST WITH CONTRAST  TECHNIQUE: Multidetector CT imaging of the chest was performed during intravenous contrast administration.  CONTRAST:  41mL OMNIPAQUE IOHEXOL 300 MG/ML  SOLN  COMPARISON:  Chest radiographs 11/27/2013.  Chest CTA 09/20/2009.  FINDINGS: Since the prior CTA, in the right upper lobe there is a new 12 x 5 mm nodule located along a curvilinear area of scarring (series 302, image 14). In the anterior inferior aspect of the upper lobe there is an area of ground-glass opacity plus tree-in-bud nodularity and distal bronchiectasis (image 30). There is chronic pleural scarring along the dome of the right diaphragm. There  is mild subpleural scarring in the right lower lobe. There is mild tree-in-bud nodularity in the lateral right lower lobe posterior to the major fissure (image 28).  There is a new 2 mm nodule in the left upper lobe on image 30. There is stable tree-in-bud nodularity in the lateral basal segment of the left lower lobe on image 42. This corresponds to the area questioned radiographically.  Underlying widespread emphysema and bronchiectasis which has mildly progressed since 2011. Major airways are patent. Small volume of retained secretions in the right posterior lateral trachea on image 16.  No pericardial or pleural effusion. No mediastinal lymphadenopathy. Negative thoracic inlet. Stable surgical clips around the gastroesophageal junction. Chronic atherosclerosis, including involvement of the coronary arteries. Chronic small benign appearing low-density area in the left hepatic lobe (58) such as due to cyst or biliary hamartoma. Otherwise negative visualized upper abdominal viscera.  No acute osseous abnormality identified.  IMPRESSION: 1. Small bilateral areas of distal airway infection/inflammation. That in the left lower lobe lateral basal segment  corresponding to the area of nodularity questioned on the earlier radiograph. 2. Progressed scarring in the right upper lobe since 2011, with a new area of 12 x 5 mm partially spiculated nodularity. Followup chest CT at 3-6 months is recommended. This recommendation follows the consensus statement: Guidelines for Management of Small Pulmonary Nodules Detected on CT Scans: A Statement from the [employer] as published in Radiology 2005; 237:395-400. 3. Chronic emphysema and bronchiectasis, mildly progressed since 2011.   Electronically Signed   By: Lars Pinks M.D.   On: 11/28/2013 09:14    CBC  Recent Labs Lab 11/27/13 1405 11/28/13 0840  WBC 6.5 9.1  HGB 10.6* 10.3*  HCT 31.3* 30.6*  PLT 213 255  MCV 83.5 83.6  MCH 28.3 28.1  MCHC 33.9 33.7  RDW 14.3 14.3    Chemistries   Recent Labs Lab 11/27/13 1405 11/28/13 0840 11/30/13 0420  NA 137 138 139  K 3.3* 3.9 4.2  CL 99 100 106  CO2 23 23 22   GLUCOSE 95 227* 131*  BUN 13 16 32*  CREATININE 0.90 0.80 0.91  CALCIUM 9.2 9.3 9.0  AST  --  20  --   ALT  --  11  --   ALKPHOS  --  108  --   BILITOT  --  0.3  --    ------------------------------------------------------------------------------------------------------------------ estimated creatinine clearance is 42.8 ml/min (by C-G formula based on Cr of 0.91). ------------------------------------------------------------------------------------------------------------------ No results found for this basename: HGBA1C,  in the last 72 hours ------------------------------------------------------------------------------------------------------------------ No results found for this basename: CHOL, HDL, LDLCALC, TRIG, CHOLHDL, LDLDIRECT,  in the last 72 hours ------------------------------------------------------------------------------------------------------------------ No results found for this basename: TSH, T4TOTAL, FREET3, T3FREE, THYROIDAB,  in the last 72  hours ------------------------------------------------------------------------------------------------------------------ No results found for this basename: VITAMINB12, FOLATE, FERRITIN, TIBC, IRON, RETICCTPCT,  in the last 72 hours  Coagulation profile No results found for this basename: INR, PROTIME,  in the last 168 hours  No results found for this basename: DDIMER,  in the last 72 hours  Cardiac Enzymes  Recent Labs Lab 11/28/13 0840 11/28/13 0927 11/29/13 2119  TROPONINI <0.30 <0.30 <0.30   ------------------------------------------------------------------------------------------------------------------ No components found with this basename: POCBNP,      Time Spent in minutes  35   Thurnell Lose M.D on 12/01/2013 at 11:45 AM  Between 7am to 7pm - Pager - [phone]  After 7pm go to [email or web address] - password [email or web address]  And look for the night  coverage person covering for me after hours  [facility] Office  [phone]  

## 2013-12-01 NOTE — Progress Notes (Signed)
Utilization review completed.  

## 2013-12-01 NOTE — Progress Notes (Signed)
Echocardiogram 2D Echocardiogram has been performed.  Alyson Locket Diallo Ponder 12/01/2013, 3:51 PM

## 2013-12-01 NOTE — Progress Notes (Signed)
Patient complained of irritation at the IV site after hanging vibramycin. Rate was slowed after verifying with pharmacy, patient still complained of irritation, antibiotic was stopped. NP on call notified, per NP on call, information will be passed onto morning MD. Will continue to monitor patient. Passed information to morning RN.

## 2013-12-02 LAB — TROPONIN I: Troponin I: <0.3 ng/mL (ref <0.30)

## 2013-12-02 MED ORDER — PREDNISONE 50 MG PO TABS
50.0000 mg | ORAL_TABLET | Freq: Every day | ORAL | Status: DC
  Administered 2013-12-03 – 2013-12-06 (×4): 50 mg via ORAL
  Filled 2013-12-02 (×6): qty 1

## 2013-12-02 MED ORDER — FUROSEMIDE 10 MG/ML IJ SOLN
20.0000 mg | Freq: Once | INTRAMUSCULAR | Status: AC
  Administered 2013-12-02: 20 mg via INTRAVENOUS
  Filled 2013-12-02: qty 2

## 2013-12-02 MED ORDER — GI COCKTAIL ~~LOC~~
30.0000 mL | Freq: Three times a day (TID) | ORAL | Status: DC | PRN
  Administered 2013-12-02 – 2013-12-06 (×5): 30 mL via ORAL
  Filled 2013-12-02 (×6): qty 30
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
## 2013-12-02 NOTE — Progress Notes (Signed)
Patient ambulated to nurses station on room air. Oxygen saturation maintained at 95% with slight drop to 90%.

## 2013-12-02 NOTE — Progress Notes (Signed)
Patient complained of chest pain. MD notified. New orders given.

## 2013-12-02 NOTE — Progress Notes (Signed)
Patient complained of chest pain-left side, rated 5. Vitals taken. Nitro given per protocol. MD notified. No new orders given. Will continue to monitor.

## 2013-12-02 NOTE — Progress Notes (Signed)
Patient Demographics  Cristian Collins, is a 80 y.o. male, DOB - 04-18-1930, XBD:[id number]  Admit date - 11/27/2013   Admitting Physician Charlynne Cousins, MD  Outpatient Primary MD for the patient is Salena Saner., MD  LOS - 5   Chief Complaint  Patient presents with  . Chest Pain  . Shortness of Breath        Assessment & Plan    Acute respiratory failure due to COPD exacerbation with leukocytosis:    Improved with IV Solu-Medrol and I will switch him to oral prednisone, continue nebulizer treatments, currently requiring oxygen which will be continued likely will be discharged home on home oxygen. Continue doxycycline for atypical coverage. He will require outpatient follow with pulmonary secondary to underlying COPD and now lung nodule which was noted on CT scan.    Protein-calorie malnutrition, severe  - nutritional supplements, follow     Essential hypertension:  - improving BP control, continue metoprolol and Norvasc.     History of CAD status post PCI 07/22/2013:  - on admission He related this chest discomfort is current currently having is different from the previous one when he had his stent placed. He had a left heart catheterization in December of last year by Dr. [name] which was essentially unremarkable.    Most likely his atypical chest pain is coming from GERD. We'll give him a dose of GI cocktail and monitor. His echocardiogram was essentially unremarkable with no wall motion abnormalities preserved EF of over 60%. Of note his admission EKG was nonacute,Troponins neg x3, continue Brilinta, zocor, metoprolol, imdur.     Lung Nodule  -new area 12x45mm partially spiculated nodularity  -follow up CT chest recommended in 3-34mos along with outpatient pulmonary  followup    Dyslipidemia. Stable on statin which will be continued.    GERD. On PPI, and a GI cocktail      Code Status: Full  Family Communication: None present  Disposition Plan: Home   Procedures  CT chest   Consults    Medications  Scheduled Meds: . albuterol  2.5 mg Nebulization QID  . amLODipine  10 mg Oral Daily  . aspirin EC  81 mg Oral Daily  . doxycycline  100 mg Oral Q12H  . feeding supplement (ENSURE COMPLETE)  237 mL Oral TID BM  . ferrous fumarate  1 tablet Oral Daily  . furosemide  20 mg Intravenous Once  . heparin  5,000 Units Subcutaneous 3 times per day  . isosorbide mononitrate  60 mg Oral Daily  . methylPREDNISolone (SOLU-MEDROL) injection  60 mg Intravenous Q12H  . metoprolol succinate  25 mg Oral Daily  . pantoprazole  40 mg Oral BID AC  . simvastatin  20 mg Oral Daily  . sodium chloride  3 mL Intravenous Q12H  . ticagrelor  90 mg Oral BID  . tiotropium  18 mcg Inhalation Daily   Continuous Infusions:  PRN Meds:.sodium chloride, acetaminophen, acetaminophen, gi cocktail, nitroGLYCERIN, ondansetron (ZOFRAN) IV, ondansetron, polyethylene glycol, sodium chloride  DVT Prophylaxis   Heparin   Lab Results  Component Value Date   PLT 255 11/28/2013    Antibiotics     Anti-infectives   Start     Dose/Rate Route Frequency Ordered Stop  12/01/13 1800  doxycycline (VIBRA-TABS) tablet 100 mg     100 mg Oral Every 12 hours 12/01/13 1246     11/27/13 1800  doxycycline (VIBRAMYCIN) 100 mg in dextrose 5 % 250 mL IVPB  Status:  Discontinued     100 mg 125 mL/hr over 120 Minutes Intravenous Every 12 hours 11/27/13 1725 12/01/13 1246          Subjective:   Cristian Collins today has, No headache, No chest pain, No abdominal pain - No Nausea, No new weakness tingling or numbness, improved Cough - SOB.    Objective:   Filed Vitals:   12/02/13 0745 12/02/13 0846 12/02/13 1004 12/02/13 1100  BP: 159/66  152/57 141/45  Pulse: 70  84 67    Temp: 97.6 F (36.4 C)  97.2 F (36.2 C) 96 F (35.6 C)  TempSrc: Oral  Oral Oral  Resp:      Height:      Weight:      SpO2: 96% 97% 98% 99%    Wt Readings from Last 3 Encounters:  12/01/13 47.9 kg (105 lb 9.6 oz)  07/21/13 45.813 kg (101 lb)  07/21/13 45.813 kg (101 lb)     Intake/Output Summary (Last 24 hours) at 12/02/13 1114 Last data filed at 12/02/13 1055  Gross per 24 hour  Intake    690 ml  Output    850 ml  Net   -160 ml     Physical Exam  Awake Alert, Oriented X 3, No new F.N deficits, Normal affect [place].AT,PERRAL Supple Neck,No JVD, No cervical lymphadenopathy appriciated.  Symmetrical Chest wall movement, Good air movement bilaterally, coarse bilateral breath sounds with some wheezing RRR,No Gallops,Rubs or new Murmurs, No Parasternal Heave +ve B.Sounds, Abd Soft, Non tender, No organomegaly appriciated, No rebound - guarding or rigidity. No Cyanosis, Clubbing or edema, No new Rash or bruise     Data Review   Micro Results No results found for this or any previous visit (from the past 240 hour(s)).  Radiology Reports Dg Chest 2 View  11/27/2013   CLINICAL DATA:  Cough and shortness of breath.  History of COPD.  EXAM: CHEST  2 VIEW  COMPARISON:  Chest x-ray 08/22/2012.  FINDINGS: Emphysematous changes are again noted throughout the lungs bilaterally. No acute consolidative airspace disease. No pleural effusions. Patchy linear opacities throughout the periphery of the left mid to lower lung, favored to be areas of mild scarring, although one of these is slightly nodular in appearance and new compared to the prior study projecting over the anterior aspect of the left sixth rib. No evidence of pulmonary edema. Heart size is normal. Mediastinal contours are unremarkable. Atherosclerotic calcifications in the thoracic aorta. Surgical clips at the gastroesophageal junction.  IMPRESSION: 1. No definite radiographic evidence of acute cardiopulmonary disease. 2. Small  nodular opacity in the left mid lung, likely within the lingula. Although this may simply represent an area of evolving scarring, this is new compared to the prior examination, and a small neoplasm is difficult to exclude. Given the background of smoking related changes in the lungs, further evaluation with nonemergent chest CT in the near future is suggested to exclude a small neoplasm. 3. Atherosclerosis.   Electronically Signed   By: Vinnie Langton M.D.   On: 11/27/2013 14:42   Ct Chest W Contrast  11/28/2013   CLINICAL DATA:  80 year old male with lung nodule suspected on chest x-ray. Shortness of breath. Chronic obstructed pulmonary disease.  EXAM: CT CHEST  WITH CONTRAST  TECHNIQUE: Multidetector CT imaging of the chest was performed during intravenous contrast administration.  CONTRAST:  53mL OMNIPAQUE IOHEXOL 300 MG/ML  SOLN  COMPARISON:  Chest radiographs 11/27/2013.  Chest CTA 09/20/2009.  FINDINGS: Since the prior CTA, in the right upper lobe there is a new 12 x 5 mm nodule located along a curvilinear area of scarring (series 302, image 14). In the anterior inferior aspect of the upper lobe there is an area of ground-glass opacity plus tree-in-bud nodularity and distal bronchiectasis (image 30). There is chronic pleural scarring along the dome of the right diaphragm. There is mild subpleural scarring in the right lower lobe. There is mild tree-in-bud nodularity in the lateral right lower lobe posterior to the major fissure (image 28).  There is a new 2 mm nodule in the left upper lobe on image 30. There is stable tree-in-bud nodularity in the lateral basal segment of the left lower lobe on image 42. This corresponds to the area questioned radiographically.  Underlying widespread emphysema and bronchiectasis which has mildly progressed since 2011. Major airways are patent. Small volume of retained secretions in the right posterior lateral trachea on image 16.  No pericardial or pleural effusion. No  mediastinal lymphadenopathy. Negative thoracic inlet. Stable surgical clips around the gastroesophageal junction. Chronic atherosclerosis, including involvement of the coronary arteries. Chronic small benign appearing low-density area in the left hepatic lobe (58) such as due to cyst or biliary hamartoma. Otherwise negative visualized upper abdominal viscera.  No acute osseous abnormality identified.  IMPRESSION: 1. Small bilateral areas of distal airway infection/inflammation. That in the left lower lobe lateral basal segment corresponding to the area of nodularity questioned on the earlier radiograph. 2. Progressed scarring in the right upper lobe since 2011, with a new area of 12 x 5 mm partially spiculated nodularity. Followup chest CT at 3-6 months is recommended. This recommendation follows the consensus statement: Guidelines for Management of Small Pulmonary Nodules Detected on CT Scans: A Statement from the [place] as published in Radiology 2005; 237:395-400. 3. Chronic emphysema and bronchiectasis, mildly progressed since 2011.   Electronically Signed   By: Lars Pinks M.D.   On: 11/28/2013 09:14    CBC  Recent Labs Lab 11/27/13 1405 11/28/13 0840  WBC 6.5 9.1  HGB 10.6* 10.3*  HCT 31.3* 30.6*  PLT 213 255  MCV 83.5 83.6  MCH 28.3 28.1  MCHC 33.9 33.7  RDW 14.3 14.3    Chemistries   Recent Labs Lab 11/27/13 1405 11/28/13 0840 11/30/13 0420  NA 137 138 139  K 3.3* 3.9 4.2  CL 99 100 106  CO2 23 23 22   GLUCOSE 95 227* 131*  BUN 13 16 32*  CREATININE 0.90 0.80 0.91  CALCIUM 9.2 9.3 9.0  AST  --  20  --   ALT  --  11  --   ALKPHOS  --  108  --   BILITOT  --  0.3  --    ------------------------------------------------------------------------------------------------------------------ estimated creatinine clearance is 43.1 ml/min (by C-G formula based on Cr of  0.91). ------------------------------------------------------------------------------------------------------------------ No results found for this basename: HGBA1C,  in the last 72 hours ------------------------------------------------------------------------------------------------------------------ No results found for this basename: CHOL, HDL, LDLCALC, TRIG, CHOLHDL, LDLDIRECT,  in the last 72 hours ------------------------------------------------------------------------------------------------------------------ No results found for this basename: TSH, T4TOTAL, FREET3, T3FREE, THYROIDAB,  in the last 72 hours ------------------------------------------------------------------------------------------------------------------ No results found for this basename: VITAMINB12, FOLATE, FERRITIN, TIBC, IRON, RETICCTPCT,  in the last 72 hours  Coagulation profile No results found for this basename: INR, PROTIME,  in the last 168 hours  No results found for this basename: DDIMER,  in the last 72 hours  Cardiac Enzymes  Recent Labs Lab 11/28/13 0840 11/28/13 0927 11/29/13 2119  TROPONINI <0.30 <0.30 <0.30   ------------------------------------------------------------------------------------------------------------------ No components found with this basename: POCBNP,      Time Spent in minutes  35   Thurnell Lose M.D on 12/02/2013 at 11:14 AM  Between 7am to 7pm - Pager - [phone]  After 7pm go to [email or web address] - password [email or web address]  And look for the night coverage person covering for me after hours  [facility] Office  [phone]

## 2013-12-03 LAB — POTASSIUM: POTASSIUM: 3.6 meq/L — AB (ref 3.7–5.3)

## 2013-12-03 MED ORDER — POTASSIUM CHLORIDE CRYS ER 20 MEQ PO TBCR
40.0000 meq | EXTENDED_RELEASE_TABLET | Freq: Once | ORAL | Status: AC
  Administered 2013-12-03: 40 meq via ORAL
  Filled 2013-12-03: qty 2

## 2013-12-03 NOTE — Progress Notes (Deleted)
Patient Demographics  Cristian Collins, is a 80 y.o. male, DOB - 08-Mar-1930, ERX:[id number]  Admit date - 11/27/2013   Admitting Physician Cristian Cousins, MD  Outpatient Primary MD for the patient is Cristian Collins., MD  LOS - 6   Chief Complaint  Patient presents with  . Chest Pain  . Shortness of Breath        Assessment & Plan    Acute respiratory failure due to COPD exacerbation with leukocytosis:    Improved with IV Solu-Medrol and I will switch him to oral prednisone, continue nebulizer treatments, currently requiring oxygen which will be continued likely will be discharged home on home oxygen. Continue doxycycline for atypical coverage. He will require outpatient follow with pulmonary secondary to underlying COPD and now lung nodule which was noted on CT scan.    Protein-calorie malnutrition, severe  - nutritional supplements, follow     Essential hypertension:  - improving BP control, continue metoprolol and Norvasc.     History of CAD status post PCI 07/22/2013:  - on admission He related this chest discomfort is current currently having is different from the previous one when he had his stent placed. He had a left heart catheterization in December of last year by Dr. Einar Collins which was essentially unremarkable. Seen again by Dr. Einar Collins on 12/02/2013 chest pain noncardiac. Continue supportive care.   Most likely his atypical chest pain is coming from GERD. We'll give him a dose of GI cocktail and monitor. His echocardiogram was essentially unremarkable with no wall motion abnormalities preserved EF of over 60%. Of note his admission EKG was nonacute,Troponins neg x3, continue Brilinta, zocor, metoprolol, imdur.     Lung Nodule  -new area 12x33mm partially spiculated  nodularity  -follow up CT chest recommended in 3-55mos along with outpatient pulmonary followup    Dyslipidemia. Stable on statin which will be continued.    GERD. On PPI, and a GI cocktail    Mild hypokalemia. Replace and monitor.       Code Status: Full  Family Communication: None present  Disposition Plan: Home   Procedures  CT chest   Consults    Medications  Scheduled Meds: . albuterol  2.5 mg Nebulization QID  . amLODipine  10 mg Oral Daily  . aspirin EC  81 mg Oral Daily  . doxycycline  100 mg Oral Q12H  . feeding supplement (ENSURE COMPLETE)  237 mL Oral TID BM  . ferrous fumarate  1 tablet Oral Daily  . heparin  5,000 Units Subcutaneous 3 times per day  . isosorbide mononitrate  60 mg Oral Daily  . metoprolol succinate  25 mg Oral Daily  . pantoprazole  40 mg Oral BID AC  . predniSONE  50 mg Oral Q breakfast  . simvastatin  20 mg Oral Daily  . sodium chloride  3 mL Intravenous Q12H  . ticagrelor  90 mg Oral BID  . tiotropium  18 mcg Inhalation Daily   Continuous Infusions:  PRN Meds:.sodium chloride, acetaminophen, acetaminophen, gi cocktail, nitroGLYCERIN, ondansetron (ZOFRAN) IV, ondansetron, polyethylene glycol, sodium chloride  DVT Prophylaxis   Heparin   Lab Results  Component Value Date   PLT 255 11/28/2013    Antibiotics     Anti-infectives  Start     Dose/Rate Route Frequency Ordered Stop   12/01/13 1800  doxycycline (VIBRA-TABS) tablet 100 mg     100 mg Oral Every 12 hours 12/01/13 1246     11/27/13 1800  doxycycline (VIBRAMYCIN) 100 mg in dextrose 5 % 250 mL IVPB  Status:  Discontinued     100 mg 125 mL/hr over 120 Minutes Intravenous Every 12 hours 11/27/13 1725 12/01/13 1246          Subjective:   Cristian Collins today has, No headache, No chest pain, No abdominal pain - No Nausea, No new weakness tingling or numbness, improved Cough - SOB.    Objective:   Filed Vitals:   12/03/13 0335 12/03/13 0730 12/03/13 0957  12/03/13 1127  BP: 139/60 128/64    Pulse: 66 69    Temp: 97.8 F (36.6 C) 97.8 F (36.6 C)    TempSrc: Oral Oral    Resp:  18    Height:      Weight:      SpO2: 96% 97% 97% 96%    Wt Readings from Last 3 Encounters:  12/02/13 50.5 kg (111 lb 5.3 oz)  07/21/13 45.813 kg (101 lb)  07/21/13 45.813 kg (101 lb)     Intake/Output Summary (Last 24 hours) at 12/03/13 1144 Last data filed at 12/03/13 1000  Gross per 24 hour  Intake   1020 ml  Output   1150 ml  Net   -130 ml     Physical Exam  Awake Alert, Oriented X 3, No new F.N deficits, Normal affect [place].AT,PERRAL Supple Neck,No JVD, No cervical lymphadenopathy appriciated.  Symmetrical Chest wall movement, Good air movement bilaterally, coarse bilateral breath sounds with some wheezing RRR,No Gallops,Rubs or new Murmurs, No Parasternal Heave +ve B.Sounds, Abd Soft, Non tender, No organomegaly appriciated, No rebound - guarding or rigidity. No Cyanosis, Clubbing or edema, No new Rash or bruise     Data Review   Micro Results No results found for this or any previous visit (from the past 240 hour(s)).  Radiology Reports Dg Chest 2 View  11/27/2013   CLINICAL DATA:  Cough and shortness of breath.  History of COPD.  EXAM: CHEST  2 VIEW  COMPARISON:  Chest x-ray 08/22/2012.  FINDINGS: Emphysematous changes are again noted throughout the lungs bilaterally. No acute consolidative airspace disease. No pleural effusions. Patchy linear opacities throughout the periphery of the left mid to lower lung, favored to be areas of mild scarring, although one of these is slightly nodular in appearance and new compared to the prior study projecting over the anterior aspect of the left sixth rib. No evidence of pulmonary edema. Heart size is normal. Mediastinal contours are unremarkable. Atherosclerotic calcifications in the thoracic aorta. Surgical clips at the gastroesophageal junction.  IMPRESSION: 1. No definite radiographic evidence of  acute cardiopulmonary disease. 2. Small nodular opacity in the left mid lung, likely within the lingula. Although this may simply represent an area of evolving scarring, this is new compared to the prior examination, and a small neoplasm is difficult to exclude. Given the background of smoking related changes in the lungs, further evaluation with nonemergent chest CT in the near future is suggested to exclude a small neoplasm. 3. Atherosclerosis.   Electronically Signed   By: Cristian Collins Langton M.D.   On: 11/27/2013 14:42   Ct Chest W Contrast  11/28/2013   CLINICAL DATA:  79 year old male with lung nodule suspected on chest x-ray. Shortness of breath. Chronic obstructed  pulmonary disease.  EXAM: CT CHEST WITH CONTRAST  TECHNIQUE: Multidetector CT imaging of the chest was performed during intravenous contrast administration.  CONTRAST:  2mL OMNIPAQUE IOHEXOL 300 MG/ML  SOLN  COMPARISON:  Chest radiographs 11/27/2013.  Chest CTA 09/20/2009.  FINDINGS: Since the prior CTA, in the right upper lobe there is a new 12 x 5 mm nodule located along a curvilinear area of scarring (series 302, image 14). In the anterior inferior aspect of the upper lobe there is an area of ground-glass opacity plus tree-in-bud nodularity and distal bronchiectasis (image 30). There is chronic pleural scarring along the dome of the right diaphragm. There is mild subpleural scarring in the right lower lobe. There is mild tree-in-bud nodularity in the lateral right lower lobe posterior to the major fissure (image 28).  There is a new 2 mm nodule in the left upper lobe on image 30. There is stable tree-in-bud nodularity in the lateral basal segment of the left lower lobe on image 42. This corresponds to the area questioned radiographically.  Underlying widespread emphysema and bronchiectasis which has mildly progressed since 2011. Major airways are patent. Small volume of retained secretions in the right posterior lateral trachea on image 16.  No  pericardial or pleural effusion. No mediastinal lymphadenopathy. Negative thoracic inlet. Stable surgical clips around the gastroesophageal junction. Chronic atherosclerosis, including involvement of the coronary arteries. Chronic small benign appearing low-density area in the left hepatic lobe (58) such as due to cyst or biliary hamartoma. Otherwise negative visualized upper abdominal viscera.  No acute osseous abnormality identified.  IMPRESSION: 1. Small bilateral areas of distal airway infection/inflammation. That in the left lower lobe lateral basal segment corresponding to the area of nodularity questioned on the earlier radiograph. 2. Progressed scarring in the right upper lobe since 2011, with a new area of 12 x 5 mm partially spiculated nodularity. Followup chest CT at 3-6 months is recommended. This recommendation follows the consensus statement: Guidelines for Management of Small Pulmonary Nodules Detected on CT Scans: A Statement from the [place] as published in Radiology 2005; 237:395-400. 3. Chronic emphysema and bronchiectasis, mildly progressed since 2011.   Electronically Signed   By: Lars Pinks M.D.   On: 11/28/2013 09:14    CBC  Recent Labs Lab 11/27/13 1405 11/28/13 0840  WBC 6.5 9.1  HGB 10.6* 10.3*  HCT 31.3* 30.6*  PLT 213 255  MCV 83.5 83.6  MCH 28.3 28.1  MCHC 33.9 33.7  RDW 14.3 14.3    Chemistries   Recent Labs Lab 11/27/13 1405 11/28/13 0840 11/30/13 0420 12/03/13 0425  NA 137 138 139  --   K 3.3* 3.9 4.2 3.6*  CL 99 100 106  --   CO2 23 23 22   --   GLUCOSE 95 227* 131*  --   BUN 13 16 32*  --   CREATININE 0.90 0.80 0.91  --   CALCIUM 9.2 9.3 9.0  --   AST  --  20  --   --   ALT  --  11  --   --   ALKPHOS  --  108  --   --   BILITOT  --  0.3  --   --    ------------------------------------------------------------------------------------------------------------------ estimated creatinine clearance is 45.5 ml/min (by C-G formula based on Cr  of 0.91). ------------------------------------------------------------------------------------------------------------------ No results found for this basename: HGBA1C,  in the last 72 hours ------------------------------------------------------------------------------------------------------------------ No results found for this basename: CHOL, HDL, LDLCALC, TRIG, CHOLHDL, LDLDIRECT,  in  the last 72 hours ------------------------------------------------------------------------------------------------------------------ No results found for this basename: TSH, T4TOTAL, FREET3, T3FREE, THYROIDAB,  in the last 72 hours ------------------------------------------------------------------------------------------------------------------ No results found for this basename: VITAMINB12, FOLATE, FERRITIN, TIBC, IRON, RETICCTPCT,  in the last 72 hours  Coagulation profile No results found for this basename: INR, PROTIME,  in the last 168 hours  No results found for this basename: DDIMER,  in the last 72 hours  Cardiac Enzymes  Recent Labs Lab 11/28/13 0927 11/29/13 2119 12/02/13 1505  TROPONINI <0.30 <0.30 <0.30   ------------------------------------------------------------------------------------------------------------------ No components found with this basename: POCBNP,      Time Spent in minutes  35   Thurnell Lose M.D on 12/03/2013 at 11:44 AM  Between 7am to 7pm - Pager - [phone]  After 7pm go to [email or web address] - password [email or web address]  And look for the night coverage person covering for me after hours  [facility] Office  [phone]

## 2013-12-03 NOTE — Progress Notes (Signed)
Patient Demographics  Cristian Collins, is a 80 y.o. male, DOB - 10/25/1929, YJE:[id number]  Admit date - 11/27/2013   Admitting Physician Charlynne Cousins, MD  Outpatient Primary MD for the patient is Salena Saner., MD  LOS - 6   Chief Complaint  Patient presents with  . Chest Pain  . Shortness of Breath        Assessment & Plan    Acute respiratory failure due to COPD exacerbation with leukocytosis:   Improving and switched to oral prednisone which will be continued, continue nebulizer treatments, and try to taper him off of oxygen, increase activity. Continue doxycycline for atypical coverage. He will require outpatient follow with pulmonary secondary to underlying COPD and now lung nodule which was noted on CT scan.    Protein-calorie malnutrition, severe  - nutritional supplements, follow     Essential hypertension:  - improving BP control, continue metoprolol and Norvasc.     History of CAD status post PCI 07/22/2013:  - on admission He related this chest discomfort is current currently having is different from the previous one when he had his stent placed. He had a left heart catheterization in December of last year by Dr. [name] which was essentially unremarkable. Seen again by Dr. [name] on 12/02/2013 chest pain noncardiac. Continue supportive care.   Most likely his atypical chest pain is coming from GERD. We'll give him a dose of GI cocktail and monitor. His echocardiogram was essentially unremarkable with no wall motion abnormalities preserved EF of over 60%. Of note his admission EKG was nonacute,Troponins neg x3, continue Brilinta, zocor, metoprolol, imdur.     Lung Nodule  -new area 12x39mm partially spiculated nodularity  -follow up CT chest recommended in 3-6 mos  along with outpatient pulmonary followup    Dyslipidemia. Stable on statin which will be continued.    GERD. On PPI, and a GI cocktail    Mild hypokalemia. Replace and monitor.       Code Status: Full  Family Communication: None present  Disposition Plan: Home   Procedures  CT chest   Consults    Medications  Scheduled Meds: . albuterol  2.5 mg Nebulization QID  . amLODipine  10 mg Oral Daily  . aspirin EC  81 mg Oral Daily  . doxycycline  100 mg Oral Q12H  . feeding supplement (ENSURE COMPLETE)  237 mL Oral TID BM  . ferrous fumarate  1 tablet Oral Daily  . heparin  5,000 Units Subcutaneous 3 times per day  . isosorbide mononitrate  60 mg Oral Daily  . metoprolol succinate  25 mg Oral Daily  . pantoprazole  40 mg Oral BID AC  . potassium chloride  40 mEq Oral Once  . predniSONE  50 mg Oral Q breakfast  . simvastatin  20 mg Oral Daily  . sodium chloride  3 mL Intravenous Q12H  . ticagrelor  90 mg Oral BID  . tiotropium  18 mcg Inhalation Daily   Continuous Infusions:  PRN Meds:.sodium chloride, acetaminophen, acetaminophen, gi cocktail, nitroGLYCERIN, ondansetron (ZOFRAN) IV, ondansetron, polyethylene glycol, sodium chloride  DVT Prophylaxis   Heparin   Lab Results  Component Value Date   PLT 255 11/28/2013    Antibiotics  Anti-infectives   Start     Dose/Rate Route Frequency Ordered Stop   12/01/13 1800  doxycycline (VIBRA-TABS) tablet 100 mg     100 mg Oral Every 12 hours 12/01/13 1246     11/27/13 1800  doxycycline (VIBRAMYCIN) 100 mg in dextrose 5 % 250 mL IVPB  Status:  Discontinued     100 mg 125 mL/hr over 120 Minutes Intravenous Every 12 hours 11/27/13 1725 12/01/13 1246          Subjective:   Cristian Collins today has, No headache, No chest pain, No abdominal pain - No Nausea, No new weakness tingling or numbness, improved Cough - SOB.    Objective:   Filed Vitals:   12/03/13 0335 12/03/13 0730 12/03/13 0957 12/03/13 1127    BP: 139/60 128/64    Pulse: 66 69    Temp: 97.8 F (36.6 C) 97.8 F (36.6 C)    TempSrc: Oral Oral    Resp:  18    Height:      Weight:      SpO2: 96% 97% 97% 96%    Wt Readings from Last 3 Encounters:  12/02/13 50.5 kg (111 lb 5.3 oz)  07/21/13 45.813 kg (101 lb)  07/21/13 45.813 kg (101 lb)     Intake/Output Summary (Last 24 hours) at 12/03/13 1209 Last data filed at 12/03/13 1000  Gross per 24 hour  Intake   1020 ml  Output   1150 ml  Net   -130 ml     Physical Exam  Awake Alert, Oriented X 3, No new F.N deficits, Normal affect [place].AT,PERRAL Supple Neck,No JVD, No cervical lymphadenopathy appriciated.  Symmetrical Chest wall movement, Good air movement bilaterally, coarse bilateral breath sounds with some wheezing RRR,No Gallops,Rubs or new Murmurs, No Parasternal Heave +ve B.Sounds, Abd Soft, Non tender, No organomegaly appriciated, No rebound - guarding or rigidity. No Cyanosis, Clubbing or edema, No new Rash or bruise     Data Review   Micro Results No results found for this or any previous visit (from the past 240 hour(s)).  Radiology Reports Dg Chest 2 View  11/27/2013   CLINICAL DATA:  Cough and shortness of breath.  History of COPD.  EXAM: CHEST  2 VIEW  COMPARISON:  Chest x-ray 08/22/2012.  FINDINGS: Emphysematous changes are again noted throughout the lungs bilaterally. No acute consolidative airspace disease. No pleural effusions. Patchy linear opacities throughout the periphery of the left mid to lower lung, favored to be areas of mild scarring, although one of these is slightly nodular in appearance and new compared to the prior study projecting over the anterior aspect of the left sixth rib. No evidence of pulmonary edema. Heart size is normal. Mediastinal contours are unremarkable. Atherosclerotic calcifications in the thoracic aorta. Surgical clips at the gastroesophageal junction.  IMPRESSION: 1. No definite radiographic evidence of acute  cardiopulmonary disease. 2. Small nodular opacity in the left mid lung, likely within the lingula. Although this may simply represent an area of evolving scarring, this is new compared to the prior examination, and a small neoplasm is difficult to exclude. Given the background of smoking related changes in the lungs, further evaluation with nonemergent chest CT in the near future is suggested to exclude a small neoplasm. 3. Atherosclerosis.   Electronically Signed   By: Vinnie Langton M.D.   On: 11/27/2013 14:42   Ct Chest W Contrast  11/28/2013   CLINICAL DATA:  80 year old male with lung nodule suspected on chest x-ray. Shortness  of breath. Chronic obstructed pulmonary disease.  EXAM: CT CHEST WITH CONTRAST  TECHNIQUE: Multidetector CT imaging of the chest was performed during intravenous contrast administration.  CONTRAST:  36mL OMNIPAQUE IOHEXOL 300 MG/ML  SOLN  COMPARISON:  Chest radiographs 11/27/2013.  Chest CTA 09/20/2009.  FINDINGS: Since the prior CTA, in the right upper lobe there is a new 12 x 5 mm nodule located along a curvilinear area of scarring (series 302, image 14). In the anterior inferior aspect of the upper lobe there is an area of ground-glass opacity plus tree-in-bud nodularity and distal bronchiectasis (image 30). There is chronic pleural scarring along the dome of the right diaphragm. There is mild subpleural scarring in the right lower lobe. There is mild tree-in-bud nodularity in the lateral right lower lobe posterior to the major fissure (image 28).  There is a new 2 mm nodule in the left upper lobe on image 30. There is stable tree-in-bud nodularity in the lateral basal segment of the left lower lobe on image 42. This corresponds to the area questioned radiographically.  Underlying widespread emphysema and bronchiectasis which has mildly progressed since 2011. Major airways are patent. Small volume of retained secretions in the right posterior lateral trachea on image 16.  No  pericardial or pleural effusion. No mediastinal lymphadenopathy. Negative thoracic inlet. Stable surgical clips around the gastroesophageal junction. Chronic atherosclerosis, including involvement of the coronary arteries. Chronic small benign appearing low-density area in the left hepatic lobe (58) such as due to cyst or biliary hamartoma. Otherwise negative visualized upper abdominal viscera.  No acute osseous abnormality identified.  IMPRESSION: 1. Small bilateral areas of distal airway infection/inflammation. That in the left lower lobe lateral basal segment corresponding to the area of nodularity questioned on the earlier radiograph. 2. Progressed scarring in the right upper lobe since 2011, with a new area of 12 x 5 mm partially spiculated nodularity. Followup chest CT at 3-6 months is recommended. This recommendation follows the consensus statement: Guidelines for Management of Small Pulmonary Nodules Detected on CT Scans: A Statement from the [place] as published in Radiology 2005; 237:395-400. 3. Chronic emphysema and bronchiectasis, mildly progressed since 2011.   Electronically Signed   By: Lars Pinks M.D.   On: 11/28/2013 09:14    CBC  Recent Labs Lab 11/27/13 1405 11/28/13 0840  WBC 6.5 9.1  HGB 10.6* 10.3*  HCT 31.3* 30.6*  PLT 213 255  MCV 83.5 83.6  MCH 28.3 28.1  MCHC 33.9 33.7  RDW 14.3 14.3    Chemistries   Recent Labs Lab 11/27/13 1405 11/28/13 0840 11/30/13 0420 12/03/13 0425  NA 137 138 139  --   K 3.3* 3.9 4.2 3.6*  CL 99 100 106  --   CO2 23 23 22   --   GLUCOSE 95 227* 131*  --   BUN 13 16 32*  --   CREATININE 0.90 0.80 0.91  --   CALCIUM 9.2 9.3 9.0  --   AST  --  20  --   --   ALT  --  11  --   --   ALKPHOS  --  108  --   --   BILITOT  --  0.3  --   --    ------------------------------------------------------------------------------------------------------------------ estimated creatinine clearance is 45.5 ml/min (by C-G formula based on Cr  of 0.91). ------------------------------------------------------------------------------------------------------------------ No results found for this basename: HGBA1C,  in the last 72 hours ------------------------------------------------------------------------------------------------------------------ No results found for this basename: CHOL, HDL, LDLCALC, TRIG,  CHOLHDL, LDLDIRECT,  in the last 72 hours ------------------------------------------------------------------------------------------------------------------ No results found for this basename: TSH, T4TOTAL, FREET3, T3FREE, THYROIDAB,  in the last 72 hours ------------------------------------------------------------------------------------------------------------------ No results found for this basename: VITAMINB12, FOLATE, FERRITIN, TIBC, IRON, RETICCTPCT,  in the last 72 hours  Coagulation profile No results found for this basename: INR, PROTIME,  in the last 168 hours  No results found for this basename: DDIMER,  in the last 72 hours  Cardiac Enzymes  Recent Labs Lab 11/28/13 0927 11/29/13 2119 12/02/13 1505  TROPONINI <0.30 <0.30 <0.30   ------------------------------------------------------------------------------------------------------------------ No components found with this basename: POCBNP,      Time Spent in minutes  35   Thurnell Lose M.D on 12/03/2013 at 12:09 PM  Between 7am to 7pm - Pager - [phone]  After 7pm go to [email or web address] - password [email or web address]  And look for the night coverage person covering for me after hours  [facility] Office  [phone]

## 2013-12-03 NOTE — Progress Notes (Signed)
CARE MANAGEMENT NOTE 12/03/2013  Patient:  Cristian Collins, Cristian Collins   Account Number:  [id number]  Date Initiated:  11/29/2013  Documentation initiated by:  [facility]  Subjective/Objective Assessment:   COPD     Action/Plan:   may need oxygen at dc   Anticipated DC Date:  12/04/2013   Anticipated DC Plan:  [place]  CM consult      [facility] Choice  HOME HEALTH   Choice offered to / List presented to:  [name]           Status of service:  In process, will continue to follow Medicare Important Message given?   (If response is "NO", the following Medicare IM given date fields will be blank) Date Medicare IM given:   Date Additional Medicare IM given:    Discharge Disposition:  HOME/SELF CARE  Per UR Regulation:  Reviewed for med. necessity/level of care/duration of stay  If discussed at [place] of Stay Meetings, dates discussed:    Comments:  12/03/2013  [place], [place] Met with patient to discuss discharge planning. He had [facility] in the past, the nurse came out to check on him and would use them again for home health. Awaiting home health orders.  [facility] called with possible home health RN, PT, no orders, plan dc tomorrow.  11/29/2013 1040 UR completed. IP ordered on 11/27/2013. Jonnie Finner RN CCM Case Mgmt phone [phone]

## 2013-12-03 NOTE — Progress Notes (Signed)
Physical Therapy Treatment Patient Details Name: Cristian Collins MRN: [id number] DOB: 23-Nov-1929 Today's Date: 12/03/2013    History of Present Illness The past medical history of COPD not oxygen dependent that comes in for shortness and chest tightness for the past 4 days prior to admission progressively getting worse. He relates productive cough yellow in color no fevers, no sick contacts no recent travel. No nausea vomiting or diarrhea. He relates in a good day he can walk about 200 feet without being short of breath now he can even walk to the bathroom without getting short of breath.    PT Comments    Pt. Reports he has had recent falls at home.  I recommended to him that he use the RW until he is stronger.  He has one at home and has used it in the past.  He appears dyspnic with walking but sats remain in the mid 90s throughout.  His HR is low 100s with walking (108).  He says he has a son that lives with him and his wife.  I suggest HHPT to follow to continue to progress his independence in mobility.    Follow Up Recommendations  Home health PT;Supervision/Assistance - 24 hour     Equipment Recommendations  None recommended by PT    Recommendations for Other Services OT consult     Precautions / Restrictions Precautions Precautions: Fall Precaution Comments: Reports R knee gives way at times while walking Restrictions Weight Bearing Restrictions: No    Mobility  Bed Mobility Overal bed mobility:  (not tested, pt. in recliner)                Transfers Overall transfer level: Needs assistance Equipment used: Rolling walker (2 wheeled) Transfers: Sit to/from Stand Sit to Stand: Supervision         General transfer comment: use of UEs to rise to stand and to control descent  Ambulation/Gait Ambulation/Gait assistance: Supervision Ambulation Distance (Feet): 200 Feet Assistive device: Straight cane Gait Pattern/deviations: Step-through pattern Gait velocity:  decreased   General Gait Details: one standing rest break at mid point of walk.  Pt. appeared dyspnic throughout walk but sats on RA remained mid 90s .   Stairs            Wheelchair Mobility    Modified Rankin (Stroke Patients Only)       Balance                                    Cognition Arousal/Alertness: Awake/alert Behavior During Therapy: WFL for tasks assessed/performed Overall Cognitive Status: Within Functional Limits for tasks assessed                      Exercises      General Comments        Pertinent Vitals/Pain See vitals tab Pt. With O2 sats 93-95 with walking on room air, appears SOB but does not complain of this    Home Living                      Prior Function            PT Goals (current goals can now be found in the care plan section) Progress towards PT goals: Progressing toward goals    Frequency  Min 3X/week    PT Plan Current plan remains appropriate  Co-evaluation             End of Session Equipment Utilized During Treatment: Gait belt Activity Tolerance: Patient tolerated treatment well Patient left: in chair;with call bell/phone within reach     Time: 1140-1203 PT Time Calculation (min): 23 min  Charges:  $Gait Training: 23-37 mins                    G Codes:      Ladona Ridgel 12/03/2013, 12:09 PM Gerlean Ren PT [facility] [place] [phone]

## 2013-12-04 MED ORDER — IPRATROPIUM-ALBUTEROL 0.5-2.5 (3) MG/3ML IN SOLN
3.0000 mL | Freq: Four times a day (QID) | RESPIRATORY_TRACT | Status: DC
  Administered 2013-12-04 – 2013-12-06 (×8): 3 mL via RESPIRATORY_TRACT
  Filled 2013-12-04 (×9): qty 3

## 2013-12-04 MED ORDER — POTASSIUM CHLORIDE CRYS ER 20 MEQ PO TBCR: 40.0000 meq | EXTENDED_RELEASE_TABLET | Freq: Once | ORAL | Status: DC

## 2013-12-04 MED ORDER — ALBUTEROL SULFATE (2.5 MG/3ML) 0.083% IN NEBU: 2.5000 mg | INHALATION_SOLUTION | RESPIRATORY_TRACT | Status: DC | PRN

## 2013-12-04 NOTE — Progress Notes (Signed)
Patient Demographics  Cristian Collins, is a 80 y.o. male, DOB - May 18, 1930, [id number]  Admit date - 11/27/2013   Admitting Physician Charlynne Cousins, MD  Outpatient Primary MD for the patient is Salena Saner., MD  LOS - 7   Chief Complaint  Patient presents with  . Chest Pain  . Shortness of Breath        Assessment & Plan    Acute respiratory failure due to COPD exacerbation with leukocytosis:   Improving and switched to oral prednisone which will be continued, continue nebulizer treatments, now off of oxygen, increase activity. Continue doxycycline for atypical coverage. He will require outpatient follow with pulmonary secondary to underlying COPD and now lung nodule which was noted on CT scan. Wants 1 more day here.    Protein-calorie malnutrition, severe  - nutritional supplements, follow     Essential hypertension:  - improving BP control, continue metoprolol and Norvasc.     History of CAD status post PCI 07/22/2013:  - on admission He related this chest discomfort is current currently having is different from the previous one when he had his stent placed. He had a left heart catheterization in December of last year by Dr. [name] which was essentially unremarkable. Seen again by Dr. [name] on 12/02/2013 chest pain noncardiac. Continue supportive care.   Most likely his atypical chest pain is coming from GERD. We'll give him a dose of GI cocktail and monitor. His echocardiogram was essentially unremarkable with no wall motion abnormalities preserved EF of over 60%. Of note his admission EKG was nonacute,Troponins neg x3, continue Brilinta, zocor, metoprolol, imdur.     Lung Nodule  -new area 12x30mm partially spiculated nodularity  -follow up CT chest recommended in  3-6 mos along with outpatient pulmonary followup    Dyslipidemia. Stable on statin which will be continued.    GERD. On PPI, and a GI cocktail    Mild hypokalemia. Replace and monitor.       Code Status: Full  Family Communication: None present  Disposition Plan: Home   Procedures  CT chest   Consults    Medications  Scheduled Meds: . amLODipine  10 mg Oral Daily  . aspirin EC  81 mg Oral Daily  . doxycycline  100 mg Oral Q12H  . feeding supplement (ENSURE COMPLETE)  237 mL Oral TID BM  . ferrous fumarate  1 tablet Oral Daily  . heparin  5,000 Units Subcutaneous 3 times per day  . ipratropium-albuterol  3 mL Nebulization Q6H  . isosorbide mononitrate  60 mg Oral Daily  . metoprolol succinate  25 mg Oral Daily  . pantoprazole  40 mg Oral BID AC  . predniSONE  50 mg Oral Q breakfast  . simvastatin  20 mg Oral Daily  . sodium chloride  3 mL Intravenous Q12H  . ticagrelor  90 mg Oral BID  . tiotropium  18 mcg Inhalation Daily   Continuous Infusions:  PRN Meds:.sodium chloride, acetaminophen, acetaminophen, albuterol, gi cocktail, nitroGLYCERIN, ondansetron (ZOFRAN) IV, ondansetron, polyethylene glycol, sodium chloride  DVT Prophylaxis   Heparin   Lab Results  Component Value Date   PLT 255 11/28/2013    Antibiotics     Anti-infectives   Start  Dose/Rate Route Frequency Ordered Stop   12/01/13 1800  doxycycline (VIBRA-TABS) tablet 100 mg     100 mg Oral Every 12 hours 12/01/13 1246     11/27/13 1800  doxycycline (VIBRAMYCIN) 100 mg in dextrose 5 % 250 mL IVPB  Status:  Discontinued     100 mg 125 mL/hr over 120 Minutes Intravenous Every 12 hours 11/27/13 1725 12/01/13 1246          Subjective:   Cristian Collins today has, No headache, No chest pain, No abdominal pain - No Nausea, No new weakness tingling or numbness, improved Cough - SOB.    Objective:   Filed Vitals:   12/03/13 1804 12/03/13 2042 12/03/13 2117 12/04/13 0546  BP: 132/76   171/95 147/53  Pulse: 74  74 71  Temp: 97.5 F (36.4 C)  98 F (36.7 C) 98.3 F (36.8 C)  TempSrc: Oral  Oral Oral  Resp: 18  18 18   Height:      Weight:   50.6 kg (111 lb 8.8 oz)   SpO2: 97% 98% 98% 96%    Wt Readings from Last 3 Encounters:  12/03/13 50.6 kg (111 lb 8.8 oz)  07/21/13 45.813 kg (101 lb)  07/21/13 45.813 kg (101 lb)     Intake/Output Summary (Last 24 hours) at 12/04/13 1009 Last data filed at 12/04/13 0600  Gross per 24 hour  Intake    360 ml  Output   1201 ml  Net   -841 ml     Physical Exam  Awake Alert, Oriented X 3, No new F.N deficits, Normal affect [place].AT,PERRAL Supple Neck,No JVD, No cervical lymphadenopathy appriciated.  Symmetrical Chest wall movement, Good air movement bilaterally, coarse bilateral breath sounds with some wheezing RRR,No Gallops,Rubs or new Murmurs, No Parasternal Heave +ve B.Sounds, Abd Soft, Non tender, No organomegaly appriciated, No rebound - guarding or rigidity. No Cyanosis, Clubbing or edema, No new Rash or bruise     Data Review   Micro Results No results found for this or any previous visit (from the past 240 hour(s)).  Radiology Reports Dg Chest 2 View  11/27/2013   CLINICAL DATA:  Cough and shortness of breath.  History of COPD.  EXAM: CHEST  2 VIEW  COMPARISON:  Chest x-ray 08/22/2012.  FINDINGS: Emphysematous changes are again noted throughout the lungs bilaterally. No acute consolidative airspace disease. No pleural effusions. Patchy linear opacities throughout the periphery of the left mid to lower lung, favored to be areas of mild scarring, although one of these is slightly nodular in appearance and new compared to the prior study projecting over the anterior aspect of the left sixth rib. No evidence of pulmonary edema. Heart size is normal. Mediastinal contours are unremarkable. Atherosclerotic calcifications in the thoracic aorta. Surgical clips at the gastroesophageal junction.  IMPRESSION: 1. No definite  radiographic evidence of acute cardiopulmonary disease. 2. Small nodular opacity in the left mid lung, likely within the lingula. Although this may simply represent an area of evolving scarring, this is new compared to the prior examination, and a small neoplasm is difficult to exclude. Given the background of smoking related changes in the lungs, further evaluation with nonemergent chest CT in the near future is suggested to exclude a small neoplasm. 3. Atherosclerosis.   Electronically Signed   By: Vinnie Langton M.D.   On: 11/27/2013 14:42   Ct Chest W Contrast  11/28/2013   CLINICAL DATA:  80 year old male with lung nodule suspected on chest x-ray. Shortness  of breath. Chronic obstructed pulmonary disease.  EXAM: CT CHEST WITH CONTRAST  TECHNIQUE: Multidetector CT imaging of the chest was performed during intravenous contrast administration.  CONTRAST:  60mL OMNIPAQUE IOHEXOL 300 MG/ML  SOLN  COMPARISON:  Chest radiographs 11/27/2013.  Chest CTA 09/20/2009.  FINDINGS: Since the prior CTA, in the right upper lobe there is a new 12 x 5 mm nodule located along a curvilinear area of scarring (series 302, image 14). In the anterior inferior aspect of the upper lobe there is an area of ground-glass opacity plus tree-in-bud nodularity and distal bronchiectasis (image 30). There is chronic pleural scarring along the dome of the right diaphragm. There is mild subpleural scarring in the right lower lobe. There is mild tree-in-bud nodularity in the lateral right lower lobe posterior to the major fissure (image 28).  There is a new 2 mm nodule in the left upper lobe on image 30. There is stable tree-in-bud nodularity in the lateral basal segment of the left lower lobe on image 42. This corresponds to the area questioned radiographically.  Underlying widespread emphysema and bronchiectasis which has mildly progressed since 2011. Major airways are patent. Small volume of retained secretions in the right posterior lateral  trachea on image 16.  No pericardial or pleural effusion. No mediastinal lymphadenopathy. Negative thoracic inlet. Stable surgical clips around the gastroesophageal junction. Chronic atherosclerosis, including involvement of the coronary arteries. Chronic small benign appearing low-density area in the left hepatic lobe (58) such as due to cyst or biliary hamartoma. Otherwise negative visualized upper abdominal viscera.  No acute osseous abnormality identified.  IMPRESSION: 1. Small bilateral areas of distal airway infection/inflammation. That in the left lower lobe lateral basal segment corresponding to the area of nodularity questioned on the earlier radiograph. 2. Progressed scarring in the right upper lobe since 2011, with a new area of 12 x 5 mm partially spiculated nodularity. Followup chest CT at 3-6 months is recommended. This recommendation follows the consensus statement: Guidelines for Management of Small Pulmonary Nodules Detected on CT Scans: A Statement from the [employer] as published in Radiology 2005; 237:395-400. 3. Chronic emphysema and bronchiectasis, mildly progressed since 2011.   Electronically Signed   By: Lars Pinks M.D.   On: 11/28/2013 09:14    CBC  Recent Labs Lab 11/27/13 1405 11/28/13 0840  WBC 6.5 9.1  HGB 10.6* 10.3*  HCT 31.3* 30.6*  PLT 213 255  MCV 83.5 83.6  MCH 28.3 28.1  MCHC 33.9 33.7  RDW 14.3 14.3    Chemistries   Recent Labs Lab 11/27/13 1405 11/28/13 0840 11/30/13 0420 12/03/13 0425  NA 137 138 139  --   K 3.3* 3.9 4.2 3.6*  CL 99 100 106  --   CO2 23 23 22   --   GLUCOSE 95 227* 131*  --   BUN 13 16 32*  --   CREATININE 0.90 0.80 0.91  --   CALCIUM 9.2 9.3 9.0  --   AST  --  20  --   --   ALT  --  11  --   --   ALKPHOS  --  108  --   --   BILITOT  --  0.3  --   --    ------------------------------------------------------------------------------------------------------------------ estimated creatinine clearance is 45.6 ml/min  (by C-G formula based on Cr of 0.91). ------------------------------------------------------------------------------------------------------------------ No results found for this basename: HGBA1C,  in the last 72 hours ------------------------------------------------------------------------------------------------------------------ No results found for this basename: CHOL, HDL, LDLCALC, TRIG,  CHOLHDL, LDLDIRECT,  in the last 72 hours ------------------------------------------------------------------------------------------------------------------ No results found for this basename: TSH, T4TOTAL, FREET3, T3FREE, THYROIDAB,  in the last 72 hours ------------------------------------------------------------------------------------------------------------------ No results found for this basename: VITAMINB12, FOLATE, FERRITIN, TIBC, IRON, RETICCTPCT,  in the last 72 hours  Coagulation profile No results found for this basename: INR, PROTIME,  in the last 168 hours  No results found for this basename: DDIMER,  in the last 72 hours  Cardiac Enzymes  Recent Labs Lab 11/28/13 0927 11/29/13 2119 12/02/13 1505  TROPONINI <0.30 <0.30 <0.30   ------------------------------------------------------------------------------------------------------------------ No components found with this basename: POCBNP,      Time Spent in minutes  35   Thurnell Lose M.D on 12/04/2013 at 10:09 AM  Between 7am to 7pm - Pager - [phone]  After 7pm go to [email or web address] - password [email or web address]  And look for the night coverage person covering for me after hours  [facility] Office  [phone]

## 2013-12-04 NOTE — Progress Notes (Signed)
Physical Therapy Treatment Patient Details Name: Cristian Collins MRN: [id number] DOB: [birth year] Today's Date: 12/04/2013    History of Present Illness The past medical history of COPD not oxygen dependent that comes in for shortness and chest tightness for the past 4 days prior to admission progressively getting worse. He relates productive cough yellow in color no fevers, no sick contacts no recent travel. No nausea vomiting or diarrhea. He relates in a good day he can walk about 200 feet without being short of breath now he can even walk to the bathroom without getting short of breath.    PT Comments    Pt. Is progressing nicely with his independence in his mobility.  Sats stable though he appears dyspnic with activity.  He seems motivated to maximize his recovery by following the medical recommendations made for him.    Follow Up Recommendations  Home health PT;Supervision/Assistance - 24 hour     Equipment Recommendations  None recommended by PT    Recommendations for Other Services OT consult     Precautions / Restrictions Precautions Precautions: Fall Precaution Comments: Reports R knee gives way at times while walking Restrictions Weight Bearing Restrictions: No    Mobility  Bed Mobility Overal bed mobility: Modified Independent             General bed mobility comments: managing on his own  Transfers Overall transfer level: Needs assistance Equipment used: Rolling walker (2 wheeled) Transfers: Sit to/from Stand Sit to Stand: Supervision         General transfer comment: controlled rise and descent today  Ambulation/Gait Ambulation/Gait assistance: Modified independent (Device/Increase time) Ambulation Distance (Feet): 200 Feet Assistive device: Rolling walker (2 wheeled) Gait Pattern/deviations: Step-through pattern;Decreased step length - right;Decreased step length - left;Trunk flexed Gait velocity: decreased Gait velocity interpretation: Below  normal speed for age/gender General Gait Details: again one brief standing rest break at mid point of walk.  Pt. with dyspnea of 3/4 but sats at least in mid 90s on room air.  Pt. with improved stability with use of RW as compared to cane and believe he will be safer at least initially with use of RW (he already has one in the home).  Needs increased time to walk stated distance due to slow pace for energy conservation   Stairs            Wheelchair Mobility    Modified Rankin (Stroke Patients Only)       Balance                                    Cognition Arousal/Alertness: Awake/alert Behavior During Therapy: WFL for tasks assessed/performed Overall Cognitive Status: Within Functional Limits for tasks assessed                      Exercises      General Comments        Pertinent Vitals/Pain See vitals tab     Home Living                      Prior Function            PT Goals (current goals can now be found in the care plan section) Progress towards PT goals: Progressing toward goals    Frequency  Min 3X/week    PT Plan Current plan remains appropriate    Co-evaluation  End of Session Equipment Utilized During Treatment: Gait belt Activity Tolerance: Patient tolerated treatment well Patient left: in chair;with call bell/phone within reach     Time: 1139-1202 PT Time Calculation (min): 23 min  Charges:  $Gait Training: 23-37 mins                    G Codes:      Ladona Ridgel 12/04/2013, 12:14 PM Gerlean Ren PT [facility] [place] [phone]

## 2013-12-05 ENCOUNTER — Inpatient Hospital Stay (HOSPITAL_COMMUNITY): Payer: Medicare Other

## 2013-12-05 LAB — TROPONIN I

## 2013-12-05 LAB — POTASSIUM: Potassium: 4.7 mEq/L (ref 3.7–5.3)

## 2013-12-05 LAB — MAGNESIUM: Magnesium: 2.3 mg/dL (ref 1.5–2.5)

## 2013-12-05 MED ORDER — LORAZEPAM 2 MG/ML IJ SOLN
INTRAMUSCULAR | Status: AC
  Filled 2013-12-05: qty 1

## 2013-12-05 MED ORDER — LORAZEPAM 2 MG/ML IJ SOLN
0.5000 mg | Freq: Once | INTRAMUSCULAR | Status: AC
  Administered 2013-12-05: 0.5 mg via INTRAVENOUS

## 2013-12-05 MED ORDER — CLONAZEPAM 0.5 MG PO TABS
0.5000 mg | ORAL_TABLET | Freq: Two times a day (BID) | ORAL | Status: DC
  Administered 2013-12-05 – 2013-12-06 (×3): 0.5 mg via ORAL
  Filled 2013-12-05 (×3): qty 1

## 2013-12-05 NOTE — Progress Notes (Signed)
[facility] progress note. Chief complaint. Chest pain. History of present illness. 80 year old male in hospital with COPD exacerbation. Patient was experiencing chest discomfort on admission. This was felt to be atypical chest pain associated with GERD. Troponins were negative x3. Patient again complains of upper abdominal pain radiating to the chest and left lateral ribs. An EKG was obtained at bedside and this indicates normal sinus rhythm without suggestion of acute ischemia. The patient was given GI cocktail and nitroglycerin with relief of the pain. I came to see the patient at bedside. Vital signs. Temperature 97.7, pulse 64, respiration 22, blood pressure 143/56. O2 sats 98%. General appearance. Thin, frail elderly male with mild dyspnea. No distress. Cardiac. Rate and rhythm regular. Lungs. Breath sounds are decreased in the bases. Mild increase work of breathing but no distress. O2 sats are stable. Abdomen. Soft with positive bowel sounds. Musculoskeletal. Pain with palpation over the lower sternum, upper abdomen and left lateral ribs patient indicates duplicates his pain. Impression/plan. Problem #1. Chest pain. From assessment I suspect pain is either musculoskeletal or GI in origin. EKG shows normal sinus rhythm without indication of ischemia ischemia. I will order troponins now and then every 6 hours for a total of 3 sets. We'll follow for these results.

## 2013-12-05 NOTE — Progress Notes (Addendum)
Patient complaining of 10 out of 10 pain, radiating from abdomen to left chest.  States that there is lots of pressure at the area.  Oxygen sat at 100% on continuous pulse ox.  GI cocktail administered; relieved minimal pain.  Two nitro tabs administered afterward; after second tab given, patient stated that pain had improved substantially.  [place], NP notified of situation: stat EKG and troponinsx3 ordered.  Paged NP after EKG completed.  Will continue to monitor patient.

## 2013-12-05 NOTE — Progress Notes (Signed)
Patient Demographics  Cristian Collins, is a 80 y.o. male, DOB - Jan 08, 1930, ELF:[id number]  Admit date - 11/27/2013   Admitting Physician Charlynne Cousins, MD  Outpatient Primary MD for the patient is Salena Saner., MD  LOS - 8   Chief Complaint  Patient presents with  . Chest Pain  . Shortness of Breath        Assessment & Plan    Acute respiratory failure due to COPD exacerbation with leukocytosis:   Improving and switched to oral prednisone which will be continued, continue nebulizer treatments, now off of oxygen, increase activity. Continue doxycycline for atypical coverage. He will require outpatient follow with pulmonary secondary to underlying COPD and now lung nodule which was noted on CT scan. Wants 1 more day here.    Protein-calorie malnutrition, severe  - nutritional supplements, follow     Essential hypertension:  - improving BP control, continue metoprolol and Norvasc.     History of CAD status post PCI 07/22/2013:  - on admission He related this chest discomfort is current currently having is different from the previous one when he had his stent placed. He had a left heart catheterization in December of last year by Dr. [name] which was essentially unremarkable. Seen again by Dr. [name] on 12/02/2013 chest pain noncardiac. Continue supportive care.   Most likely his atypical chest pain is coming from GERD. We'll give him a dose of GI cocktail and monitor. His echocardiogram was essentially unremarkable with no wall motion abnormalities preserved EF of over 60%. Of note his admission EKG was nonacute,Troponins neg x3, continue Brilinta, zocor, metoprolol, imdur.     Lung Nodule  -new area 12x47mm partially spiculated nodularity  -follow up CT chest recommended in  3-6 mos along with outpatient pulmonary followup    Dyslipidemia. Stable on statin which will be continued.    GERD. On PPI, and a GI cocktail    Mild hypokalemia. Replaced and monitor.    Mild abdominal pain this morning. Now resolved, check KUB to rule out stool burden. If positive we'll place on bowel regimen. No nausea no diarrhea.      Code Status: Full  Family Communication: None present  Disposition Plan: Home   Procedures  CT chest   Consults    Medications  Scheduled Meds: . amLODipine  10 mg Oral Daily  . aspirin EC  81 mg Oral Daily  . doxycycline  100 mg Oral Q12H  . feeding supplement (ENSURE COMPLETE)  237 mL Oral TID BM  . ferrous fumarate  1 tablet Oral Daily  . heparin  5,000 Units Subcutaneous 3 times per day  . ipratropium-albuterol  3 mL Nebulization Q6H  . isosorbide mononitrate  60 mg Oral Daily  . LORazepam      . metoprolol succinate  25 mg Oral Daily  . pantoprazole  40 mg Oral BID AC  . predniSONE  50 mg Oral Q breakfast  . simvastatin  20 mg Oral Daily  . sodium chloride  3 mL Intravenous Q12H  . ticagrelor  90 mg Oral BID  . tiotropium  18 mcg Inhalation Daily   Continuous Infusions:  PRN Meds:.sodium chloride, acetaminophen, albuterol, gi cocktail, nitroGLYCERIN, ondansetron (ZOFRAN) IV, polyethylene glycol, sodium chloride  DVT Prophylaxis   Heparin   Lab Results  Component Value Date   PLT 255 11/28/2013    Antibiotics     Anti-infectives   Start     Dose/Rate Route Frequency Ordered Stop   12/01/13 1800  doxycycline (VIBRA-TABS) tablet 100 mg     100 mg Oral Every 12 hours 12/01/13 1246     11/27/13 1800  doxycycline (VIBRAMYCIN) 100 mg in dextrose 5 % 250 mL IVPB  Status:  Discontinued     100 mg 125 mL/hr over 120 Minutes Intravenous Every 12 hours 11/27/13 1725 12/01/13 1246          Subjective:   Cristian Collins today has, No headache, No chest pain, No Nausea, No new weakness tingling or numbness,  improved Cough - SOB.  Mild abdominal pain this morning which has resolved.  Objective:   Filed Vitals:   12/05/13 0207 12/05/13 0500 12/05/13 0746 12/05/13 1006  BP:  143/56  154/62  Pulse:  64  79  Temp:  97.7 F (36.5 C)  97.6 F (36.4 C)  TempSrc:  Oral  Oral  Resp:  22  20  Height:      Weight:      SpO2: 100% 98% 100% 98%    Wt Readings from Last 3 Encounters:  12/03/13 50.6 kg (111 lb 8.8 oz)  07/21/13 45.813 kg (101 lb)  07/21/13 45.813 kg (101 lb)     Intake/Output Summary (Last 24 hours) at 12/05/13 1052 Last data filed at 12/05/13 1041  Gross per 24 hour  Intake    483 ml  Output   1425 ml  Net   -942 ml     Physical Exam  Awake Alert, Oriented X 3, No new F.N deficits, Normal affect [place].AT,PERRAL Supple Neck,No JVD, No cervical lymphadenopathy appriciated.  Symmetrical Chest wall movement, Good air movement bilaterally, coarse bilateral breath sounds with some wheezing RRR,No Gallops,Rubs or new Murmurs, No Parasternal Heave +ve B.Sounds, Abd Soft, Non tender, No organomegaly appriciated, No rebound - guarding or rigidity. No Cyanosis, Clubbing or edema, No new Rash or bruise     Data Review   Micro Results No results found for this or any previous visit (from the past 240 hour(s)).  Radiology Reports Dg Chest 2 View  11/27/2013   CLINICAL DATA:  Cough and shortness of breath.  History of COPD.  EXAM: CHEST  2 VIEW  COMPARISON:  Chest x-ray 08/22/2012.  FINDINGS: Emphysematous changes are again noted throughout the lungs bilaterally. No acute consolidative airspace disease. No pleural effusions. Patchy linear opacities throughout the periphery of the left mid to lower lung, favored to be areas of mild scarring, although one of these is slightly nodular in appearance and new compared to the prior study projecting over the anterior aspect of the left sixth rib. No evidence of pulmonary edema. Heart size is normal. Mediastinal contours are unremarkable.  Atherosclerotic calcifications in the thoracic aorta. Surgical clips at the gastroesophageal junction.  IMPRESSION: 1. No definite radiographic evidence of acute cardiopulmonary disease. 2. Small nodular opacity in the left mid lung, likely within the lingula. Although this may simply represent an area of evolving scarring, this is new compared to the prior examination, and a small neoplasm is difficult to exclude. Given the background of smoking related changes in the lungs, further evaluation with nonemergent chest CT in the near future is suggested to exclude a small neoplasm. 3. Atherosclerosis.   Electronically Signed   By: Vinnie Langton  M.D.   On: 11/27/2013 14:42   Ct Chest W Contrast  11/28/2013   CLINICAL DATA:  80 year old male with lung nodule suspected on chest x-ray. Shortness of breath. Chronic obstructed pulmonary disease.  EXAM: CT CHEST WITH CONTRAST  TECHNIQUE: Multidetector CT imaging of the chest was performed during intravenous contrast administration.  CONTRAST:  66mL OMNIPAQUE IOHEXOL 300 MG/ML  SOLN  COMPARISON:  Chest radiographs 11/27/2013.  Chest CTA 09/20/2009.  FINDINGS: Since the prior CTA, in the right upper lobe there is a new 12 x 5 mm nodule located along a curvilinear area of scarring (series 302, image 14). In the anterior inferior aspect of the upper lobe there is an area of ground-glass opacity plus tree-in-bud nodularity and distal bronchiectasis (image 30). There is chronic pleural scarring along the dome of the right diaphragm. There is mild subpleural scarring in the right lower lobe. There is mild tree-in-bud nodularity in the lateral right lower lobe posterior to the major fissure (image 28).  There is a new 2 mm nodule in the left upper lobe on image 30. There is stable tree-in-bud nodularity in the lateral basal segment of the left lower lobe on image 42. This corresponds to the area questioned radiographically.  Underlying widespread emphysema and bronchiectasis  which has mildly progressed since 2011. Major airways are patent. Small volume of retained secretions in the right posterior lateral trachea on image 16.  No pericardial or pleural effusion. No mediastinal lymphadenopathy. Negative thoracic inlet. Stable surgical clips around the gastroesophageal junction. Chronic atherosclerosis, including involvement of the coronary arteries. Chronic small benign appearing low-density area in the left hepatic lobe (58) such as due to cyst or biliary hamartoma. Otherwise negative visualized upper abdominal viscera.  No acute osseous abnormality identified.  IMPRESSION: 1. Small bilateral areas of distal airway infection/inflammation. That in the left lower lobe lateral basal segment corresponding to the area of nodularity questioned on the earlier radiograph. 2. Progressed scarring in the right upper lobe since 2011, with a new area of 12 x 5 mm partially spiculated nodularity. Followup chest CT at 3-6 months is recommended. This recommendation follows the consensus statement: Guidelines for Management of Small Pulmonary Nodules Detected on CT Scans: A Statement from the [employer] as published in Radiology 2005; 237:395-400. 3. Chronic emphysema and bronchiectasis, mildly progressed since 2011.   Electronically Signed   By: Lars Pinks M.D.   On: 11/28/2013 09:14    CBC No results found for this basename: WBC, HGB, HCT, PLT, MCV, MCH, MCHC, RDW, NEUTRABS, LYMPHSABS, MONOABS, EOSABS, BASOSABS, BANDABS, BANDSABD,  in the last 168 hours  Chemistries   Recent Labs Lab 11/30/13 0420 12/03/13 0425 12/05/13 0524  NA 139  --   --   K 4.2 3.6* 4.7  CL 106  --   --   CO2 22  --   --   GLUCOSE 131*  --   --   BUN 32*  --   --   CREATININE 0.91  --   --   CALCIUM 9.0  --   --   MG  --   --  2.3   ------------------------------------------------------------------------------------------------------------------ estimated creatinine clearance is 45.6 ml/min (by C-G  formula based on Cr of 0.91). ------------------------------------------------------------------------------------------------------------------ No results found for this basename: HGBA1C,  in the last 72 hours ------------------------------------------------------------------------------------------------------------------ No results found for this basename: CHOL, HDL, LDLCALC, TRIG, CHOLHDL, LDLDIRECT,  in the last 72 hours ------------------------------------------------------------------------------------------------------------------ No results found for this basename: TSH, T4TOTAL, FREET3, T3FREE, THYROIDAB,  in the last 72 hours ------------------------------------------------------------------------------------------------------------------ No results found for this basename: VITAMINB12, FOLATE, FERRITIN, TIBC, IRON, RETICCTPCT,  in the last 72 hours  Coagulation profile No results found for this basename: INR, PROTIME,  in the last 168 hours  No results found for this basename: DDIMER,  in the last 72 hours  Cardiac Enzymes  Recent Labs Lab 11/29/13 2119 12/02/13 1505 12/05/13 0718  TROPONINI <0.30 <0.30 <0.30   ------------------------------------------------------------------------------------------------------------------ No components found with this basename: POCBNP,      Time Spent in minutes  35   Thurnell Lose M.D on 12/05/2013 at 10:52 AM  Between 7am to 7pm - Pager - [phone]  After 7pm go to [email or web address] - password [email or web address]  And look for the night coverage person covering for me after hours  [facility] Office  [phone]

## 2013-12-06 MED ORDER — CLONAZEPAM 0.5 MG PO TABS: 0.5000 mg | ORAL_TABLET | Freq: Two times a day (BID) | ORAL | Status: DC

## 2013-12-06 MED ORDER — ALBUTEROL SULFATE (2.5 MG/3ML) 0.083% IN NEBU: 2.5000 mg | INHALATION_SOLUTION | RESPIRATORY_TRACT | Status: AC | PRN

## 2013-12-06 MED ORDER — ENSURE COMPLETE PO LIQD: 237.0000 mL | Freq: Three times a day (TID) | ORAL | Status: AC

## 2013-12-06 MED ORDER — BUDESONIDE-FORMOTEROL FUMARATE 160-4.5 MCG/ACT IN AERO: 2.0000 | INHALATION_SPRAY | Freq: Two times a day (BID) | RESPIRATORY_TRACT | Status: DC

## 2013-12-06 MED ORDER — PREDNISONE 5 MG PO TABS: ORAL_TABLET | ORAL | Status: DC

## 2013-12-06 NOTE — Care Management Note (Signed)
CARE MANAGEMENT NOTE 12/06/2013  Patient:  Cristian Collins, Cristian Collins   Account Number:  [id number]  Date Initiated:  11/29/2013  Documentation initiated by:  [facility]  Subjective/Objective Assessment:   COPD     Action/Plan:   may need oxygen at dc   Anticipated DC Date:  12/06/2013   Anticipated DC Plan:  [place]  CM consult      [facility] Choice  HOME HEALTH   Choice offered to / List presented to:  [name]   DME arranged  [facility]      DME agency  [place] arranged  HH-1 RN  [place].   Status of service:  Completed, signed off Medicare Important Message given?   (If response is "NO", the following Medicare IM given date fields will be blank) Date Medicare IM given:   Date Additional Medicare IM given:    Discharge Disposition:  [place]  Per UR Regulation:  Reviewed for med. necessity/level of care/duration of stay  If discussed at [place] of Stay Meetings, dates discussed:    Comments:   12/06/2013 Notified [facility] of plan to d/c pt to home with oxygen and need for HHRN, HHPT and [place] aide. Jasmine Pang RN MPH, case manager, [phone]  12/03/2013  [place], [place] Met with patient to discuss discharge planning. He had [facility] in the past, the nurse came out to check on him and would use them again for home health. Awaiting home health orders.  [facility] called with possible home health RN, PT, no orders, plan dc tomorrow.  11/29/2013 1040 UR completed. IP ordered on 11/27/2013. Jonnie Finner RN CCM Case Mgmt phone [phone]

## 2013-12-06 NOTE — Progress Notes (Signed)
Patient discharged home with brother. Patient given discharge paperwork and prescriptions. Patient was discharged with belongings and given home oxygen. [facility]. Patient was stable upon discharge.

## 2013-12-06 NOTE — Discharge Instructions (Signed)
Follow with Primary MD Salena Saner., MD in 7 days   Get CBC, CMP, checked 7 days by Primary MD and again as instructed by your Primary MD. Get a 2 view Chest X ray done next visit .   Activity: As tolerated with Full fall precautions use walker/cane & assistance as needed   Disposition Home     Diet: Heart Healthy     Check your Weight same time everyday, if you gain over 2 pounds, or you develop in leg swelling, experience more shortness of breath or chest pain, call your Primary MD immediately. Follow Cardiac Low Salt Diet and 1.8 lit/day fluid restriction.   On your next visit with her primary care physician please Get Medicines reviewed and adjusted.  Please request your Prim.MD to go over all Hospital Tests and Procedure/Radiological results at the follow up, please get all Hospital records sent to your Prim MD by signing hospital release before you go home.   If you experience worsening of your admission symptoms, develop shortness of breath, life threatening emergency, suicidal or homicidal thoughts you must seek medical attention immediately by calling 911 or calling your MD immediately  if symptoms less severe.  You Must read complete instructions/literature along with all the possible adverse reactions/side effects for all the Medicines you take and that have been prescribed to you. Take any new Medicines after you have completely understood and accpet all the possible adverse reactions/side effects.   Do not drive and provide baby sitting services if your were admitted for syncope or siezures until you have seen by Primary MD or a Neurologist and advised to do so again.  Do not drive when taking Pain medications.    Do not take more than prescribed Pain, Sleep and Anxiety Medications  Special Instructions: If you have smoked or chewed Tobacco  in the last 2 yrs please stop smoking, stop any regular Alcohol  and or any Recreational drug use.  Wear Seat belts while  driving.   Please note  You were cared for by a hospitalist during your hospital stay. If you have any questions about your discharge medications or the care you received while you were in the hospital after you are discharged, you can call the unit and asked to speak with the hospitalist on call if the hospitalist that took care of you is not available. Once you are discharged, your primary care physician will handle any further medical issues. Please note that NO REFILLS for any discharge medications will be authorized once you are discharged, as it is imperative that you return to your primary care physician (or establish a relationship with a primary care physician if you do not have one) for your aftercare needs so that they can reassess your need for medications and monitor your lab values.
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
## 2013-12-06 NOTE — Progress Notes (Signed)
NUTRITION FOLLOW-UP  DOCUMENTATION CODES Per approved criteria  -Underweight -Severe malnutrition in the context of chronic illness   INTERVENTION: Continue Ensure Complete po TID, each supplement provides 350 kcal and 13 grams of protein Encouraged high kcal/protein snacks RD to continue to monitor  NUTRITION DIAGNOSIS: Increased nutrient needs (protein/kcal)  related to increased demand for nutrients as evidenced by BMI <18.5. Ongoing.  Goal: Pt to meet >/= 90% of their estimated nutrition needs - improved.  Monitor:  Total protein/kcal intake, labs, weights  ASSESSMENT: Cristian Collins is a 80 y.o. male with past medical history of COPD not oxygen dependent that comes in for shortness and chest tightness for the past 4 days prior to admission progressively getting worse. Work-up reveals is acute respiratory failure 2/2 COPD exacerbation.  Pt with lung nodule noted on CT scan - plans for outpatient work-up.  Currently ordered for Regular diet, consuming 50-100% of meals. Pt ordered for Ensure, states that he enjoys it. Feels as though he has been eating well while here.  Pt meets criteria for severe MALNUTRITION in the context of chronic illness as evidenced by severe fat and muscle mass loss.   Height: Ht Readings from Last 1 Encounters:  12/01/13 5\' 6"  (1.676 m)    Weight: Wt Readings from Last 1 Encounters:  12/03/13 111 lb 8.8 oz (50.6 kg)  Admit wt 96 lb  BMI:  Body mass index is 18.01 kg/(m^2). Underweight  Estimated Nutritional Needs: Kcal: 1500-1700 Protein: 65-75 gram Fluid: >/=1500 ml/daily  Skin: intact  Diet Order: General    Intake/Output Summary (Last 24 hours) at 12/06/13 1055 Last data filed at 12/06/13 0945  Gross per 24 hour  Intake    903 ml  Output   2151 ml  Net  -1248 ml    Last BM: 4/25  Labs:   Recent Labs Lab 11/30/13 0420 12/03/13 0425 12/05/13 0524  NA 139  --   --   K 4.2 3.6* 4.7  CL 106  --   --   CO2 22  --    --   BUN 32*  --   --   CREATININE 0.91  --   --   CALCIUM 9.0  --   --   MG  --   --  2.3  GLUCOSE 131*  --   --     CBG (last 3)  No results found for this basename: GLUCAP,  in the last 72 hours  Scheduled Meds: . amLODipine  10 mg Oral Daily  . aspirin EC  81 mg Oral Daily  . clonazePAM  0.5 mg Oral BID  . doxycycline  100 mg Oral Q12H  . feeding supplement (ENSURE COMPLETE)  237 mL Oral TID BM  . ferrous fumarate  1 tablet Oral Daily  . heparin  5,000 Units Subcutaneous 3 times per day  . ipratropium-albuterol  3 mL Nebulization Q6H  . isosorbide mononitrate  60 mg Oral Daily  . metoprolol succinate  25 mg Oral Daily  . pantoprazole  40 mg Oral BID AC  . predniSONE  50 mg Oral Q breakfast  . simvastatin  20 mg Oral Daily  . sodium chloride  3 mL Intravenous Q12H  . ticagrelor  90 mg Oral BID  . tiotropium  18 mcg Inhalation Daily    Continuous Infusions:   Inda Coke MS, RD, LDN Inpatient Registered Dietitian Pager: [phone] After-hours pager: [phone]

## 2013-12-06 NOTE — Care Management Note (Signed)
SATURATION QUALIFICATIONS: (This note is used to comply with regulatory documentation for home oxygen)  Patient Saturations on Room Air at Rest =  90%  Patient Saturations on Room Air while Ambulating = 88%  Patient Saturations on 2   Liters of oxygen while Ambulating = 93  %  Please briefly explain why patient needs home oxygen: Hx COPD, currently declining with increasing dyspnea with exertion. Pt will need oxygen in order to be able to ambulate at home.

## 2013-12-14 ENCOUNTER — Other Ambulatory Visit: Payer: Self-pay | Admitting: Family

## 2013-12-14 ENCOUNTER — Ambulatory Visit
Admission: RE | Admit: 2013-12-14 | Discharge: 2013-12-14 | Disposition: A | Discharge status: Home / Self Care | Payer: Medicare Other | Source: Ambulatory Visit | Attending: Family | Admitting: Family

## 2013-12-14 DIAGNOSIS — J441 Chronic obstructive pulmonary disease with (acute) exacerbation: Secondary | ICD-10-CM

## 2013-12-23 ENCOUNTER — Encounter: Payer: Self-pay | Admitting: Adult Health

## 2013-12-23 ENCOUNTER — Ambulatory Visit (INDEPENDENT_AMBULATORY_CARE_PROVIDER_SITE_OTHER): Payer: Medicare Other | Admitting: Adult Health

## 2013-12-23 VITALS — BP 126/86 | HR 78 | Temp 97.7°F | Ht 66.0 in | Wt 109.8 lb

## 2013-12-23 DIAGNOSIS — J449 Chronic obstructive pulmonary disease, unspecified: Secondary | ICD-10-CM

## 2013-12-23 DIAGNOSIS — R911 Solitary pulmonary nodule: Secondary | ICD-10-CM

## 2013-12-23 NOTE — Patient Instructions (Signed)
Finish Prednisone taper as directed.  Continue on Symbicort 2 puffs Twice daily   Continue on Spiriva 1 puff daily  Continue on Oxygen 2l/m continuously  Follow up Dr. Melvyn Novas  In 4 weeks and As needed

## 2013-12-27 DIAGNOSIS — R911 Solitary pulmonary nodule: Secondary | ICD-10-CM | POA: Insufficient documentation

## 2013-12-27 NOTE — Assessment & Plan Note (Signed)
CT chest showed a progressive scar in the right upper lobe with a new spiculated nodularity.  Consider repeat CT in 3 months.-discuss on return

## 2013-12-27 NOTE — Assessment & Plan Note (Signed)
Exacerbation, now resolving   Plan  Finish Prednisone taper as directed.  Continue on Symbicort 2 puffs Twice daily   Continue on Spiriva 1 puff daily  Continue on Oxygen 2l/m continuously  Follow up Dr. Melvyn Novas  In 4 weeks and As needed

## 2013-12-27 NOTE — Progress Notes (Signed)
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   rec Symbicort 160 Take 2 puffs first thing in am and then another 2 puffs about 12 hours later.  Stop advair Stay on spiriva Work on inhaler technique: . Please schedule a follow up office visit in 4 weeks, sooner if needed with pft's   05/21/2012 f/u ov/Wert cc breathing better but still c/o doe eg  limiting from walking the dogs more than 15-20 min s stopping, very slow pace. rec Work on inhaler technique:but no change rx    11/13/2012 f/u ov/Wert cc Chief Complaint  Patient presents with  . Follow-up    SOB progressivley worse since last visit, also c/o cough worse for the past 6 months, prod with white sputum   using saba 3 puffs per day with very poor technique plus neb, confused with how to use rescue, did not come with wife but says he does his own inhalers, she does his meds.  Doe x > 20 min walking dogs rec Plan A thru C reviewed in writing    12/25/2012 f/u ov/Wert re GOLD III COPD Chief Complaint  Patient presents with  . Follow-up    Cough has pretty much resolved and his breathing is much improved since the last visit. No new co's today.    avg use couple times a week saba and really not limited from desired activities by sob >>no changes   12/23/13 [facility] follow up  Patient returns for a post hospital followup. Patient was admitted April 18 through April 27 for acute respiratory failure due to COPD,  exacerbation. He was treated with IV antibiotics, steroids, nebulized bronchodilators. CT chest showed a progressive scar in the right upper lobe with a new spiculated nodularity. With recommendations to follow up CT in 3 months. reports is doing well since discharge, except does report DOE (pursed lip breathing helps).  denies cough, wheezing, tightness, hemoptysis, nausea, vomiting      Current Medications, Allergies, Past Medical History, Past Surgical History, Family History, and Social History were reviewed in [employer] record.  ROS  The following are not active complaints unless bolded sore throat, dysphagia, dental problems, itching, sneezing,  nasal congestion or excess/ purulent secretions, ear ache,   fever, chills, sweats, unintended wt loss, pleuritic or exertional cp, hemoptysis,  orthopnea pnd or leg swelling, presyncope, palpitations, heartburn, abdominal pain, anorexia, nausea, vomiting, diarrhea  or change in bowel or urinary habits, change in stools or urine, dysuria,hematuria,  rash, arthralgias, visual complaints, headache, numbness weakness or ataxia or problems with walking or coordination,  change in mood/affect or memory.                   Objective:   Physical Exam      edentulous thin bm nad walking with cane mild / mod hoarse HEENT mild turbinate edema.  Oropharynx no thrush or excess pnd or cobblestoning.  No JVD or cervical adenopathy. Mild accessory muscle hypertrophy. Trachea midline, nl thryroid. Chest was hyperinflated by percussion with diminished breath sounds and moderate increased exp time without wheeze. Hoover sign positive at mid inspiration. Regular rate and rhythm without murmur gallop or rub or increase P2 or edema.  Abd: no hsm, nl excursion. Ext warm without cyanosis or clubbing.           Assessment & Plan:

## 2014-01-03 ENCOUNTER — Other Ambulatory Visit: Payer: Self-pay | Admitting: Internal Medicine

## 2014-01-20 ENCOUNTER — Ambulatory Visit (INDEPENDENT_AMBULATORY_CARE_PROVIDER_SITE_OTHER): Payer: Medicare Other | Admitting: Internal Medicine

## 2014-01-20 ENCOUNTER — Encounter: Payer: Self-pay | Admitting: Internal Medicine

## 2014-01-20 VITALS — BP 110/58 | HR 74 | Temp 98.0°F | Ht 66.0 in | Wt 104.0 lb

## 2014-01-20 DIAGNOSIS — R911 Solitary pulmonary nodule: Secondary | ICD-10-CM

## 2014-01-20 DIAGNOSIS — J449 Chronic obstructive pulmonary disease, unspecified: Secondary | ICD-10-CM

## 2014-01-20 DIAGNOSIS — J9610 Chronic respiratory failure, unspecified whether with hypoxia or hypercapnia: Secondary | ICD-10-CM

## 2014-01-20 DIAGNOSIS — J9612 Chronic respiratory failure with hypercapnia: Secondary | ICD-10-CM

## 2014-01-20 DIAGNOSIS — J9611 Chronic respiratory failure with hypoxia: Secondary | ICD-10-CM | POA: Insufficient documentation

## 2014-01-20 NOTE — Assessment & Plan Note (Signed)
11/28/2013 >CT chest showed a progressive scar in the right upper lobe with a new spiculated nodularity.  - rec f/u cxr 04/2014   Not a candidate for any form of aggressive rx > f/u conservatively (Discussed in detail all the  indications, usual  risks and alternatives  relative to the benefits with patient who agrees to proceed with this plan).

## 2014-01-20 NOTE — Assessment & Plan Note (Signed)
-   PFTs 05/21/2012 0.69 (32) ratio 41 and 34% better p B2  DDX of  difficult airways managment all start with A and  include Adherence, Ace Inhibitors, Acid Reflux, Active Sinus Disease, Alpha 1 Antitripsin deficiency, Anxiety masquerading as Airways dz,  ABPA,  allergy(esp in young), Aspiration (esp in elderly), Adverse effects of DPI,  Active smokers, plus two Bs  = Bronchiectasis and Beta blocker use..and one C= CHF  Adherence is always the initial "prime suspect" and is a multilayered concern that requires a "trust but verify" approach in every patient - starting with knowing how to use medications, especially inhalers, correctly, keeping up with refills and understanding the fundamental difference between maintenance and prns vs those medications only taken for a very short course and then stopped and not refilled.  - The proper method of use, as well as anticipated side effects, of a metered-dose inhaler are discussed and demonstrated to the patient. Improved effectiveness after extensive coaching during this visit to a level of approximately  75%     Each maintenance medication was reviewed in detail including most importantly the difference between maintenance and as needed and under what circumstances the prns are to be used.  Please see instructions for details which were reviewed in writing and the patient given a copy.

## 2014-01-20 NOTE — Progress Notes (Signed)
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   rec Symbicort 160 Take 2 puffs first thing in am and then another 2 puffs about 12 hours later.  Stop advair Stay on spiriva Work on inhaler technique: . Please schedule a follow up office visit in 4 weeks, sooner if needed with pft's   05/21/2012 f/u ov/Wert cc breathing better but still c/o doe eg  limiting from walking the dogs more than 15-20 min s stopping, very slow pace. rec Work on inhaler technique:but no change rx    11/13/2012 f/u ov/Wert re copd  Chief Complaint  Patient presents with  . Follow-up    SOB progressivley worse since last visit, also c/o cough worse for the past 6 months, prod with white sputum   using saba 3 puffs per day with very poor technique plus neb, confused with how to use rescue, did not come with wife but says he does his own inhalers, she does his meds.  Doe x > 20 min walking dogs rec Plan A thru C reviewed in writing    12/25/2012 f/u ov/Wert re GOLD III COPD maint on symbicort/ spiriva and prn saba hfa Chief Complaint  Patient presents with  . Follow-up    Cough has pretty much resolved and his breathing is much improved since the last visit. No new co's today.    avg use couple times a week saba and really not limited from desired activities by sob >>no changes but reviewed action plan in writing  > did not follow   12/23/13 [facility]  follow up  Patient returns for a post hospital followup. Patient was admitted April 18 through April 27 for acute respiratory failure due to COPD, exacerbation. He was treated with IV antibiotics, steroids, nebulized bronchodilators. CT chest showed a progressive scar in the right upper lobe with a new spiculated nodularity. With recommendations to follow up CT in 3 months. reports is doing well since discharge, except does report DOE (pursed lip breathing helps).    rec Finish Prednisone taper as directed.  Continue on Symbicort 2 puffs Twice daily   Continue on Spiriva 1 puff daily  Continue on Oxygen 2l/m continuously    01/20/2014 f/u ov/Wert re: GOLD III COPD/ now 02 dep 2lpm 24/7  Chief Complaint  Patient presents with  . Follow-up    Pt states that his breathing continues to improve. He has minimal cough with clear sputum that he relates to PND. He c/o green nasal d/c for the past several days.   symbicort/spiriva each am/ very confused with instructions/ names of meds / now has neb also and now on 02     No obvious day to day or daytime variabilty or assoc cp or chest tightness, subjective wheeze overt sinus or hb symptoms. No unusual exp hx or h/o childhood pna/ asthma or knowledge of premature  birth.  Sleeping ok without nocturnal  or early am exacerbation  of respiratory  c/o's or need for noct saba. Also denies any obvious fluctuation of symptoms with weather or environmental changes or other aggravating or alleviating factors except as outlined above   Current Medications, Allergies, Complete Past Medical History, Past Surgical History, Family History, and Social History were reviewed in [employer] record.  ROS  The following are not active complaints unless bolded sore throat, dysphagia, dental problems, itching, sneezing,  nasal congestion or excess/ purulent secretions, ear ache,   fever, chills, sweats, unintended wt loss, pleuritic or exertional  cp, hemoptysis,  orthopnea pnd or leg swelling, presyncope, palpitations, heartburn, abdominal pain, anorexia, nausea, vomiting, diarrhea  or change in bowel or urinary habits, change in stools or urine, dysuria,hematuria,  rash, arthralgias, visual complaints, headache, numbness weakness or ataxia or problems with walking or coordination,  change in mood/affect or memory.                    Objective:   Physical Exam      edentulous thin bm nad walking with cane mildly   hoarse  Wt Readings from Last 3 Encounters:  01/20/14 104 lb (47.174 kg)  12/23/13 109 lb 12.8 oz (49.805 kg)  12/03/13 111 lb 8.8 oz (50.6 kg)     HEENT mild turbinate edema.  Oropharynx no thrush or excess pnd or cobblestoning.  No JVD or cervical adenopathy. Mild accessory muscle hypertrophy. Trachea midline, nl thryroid. Chest was hyperinflated by percussion with diminished breath sounds and moderate increased exp time without wheeze. Hoover sign positive at mid inspiration. Regular rate and rhythm without murmur gallop or rub or increase P2 or edema.  Abd: no hsm, nl excursion. Ext warm without cyanosis or clubbing.       12/14/13 cxr Emphysema without acute cardiopulmonary disease or interval change  from prior.     Assessment & Plan:

## 2014-01-20 NOTE — Patient Instructions (Signed)
Plan A = symbicort 160 Take 2 puffs first thing in am and then another 2 puffs about 12 hours later.                 spiriva also each am  Plan B= backup = proair up to 2 puff every 4 hours  Plan C = Nebulizer use this up to every 4 hours if Plan B doesn't work  Plan D = Doctor,  Call if needing more nebulizer treatments than usual    Work on inhaler technique:  relax and gently blow all the way out then take a nice smooth deep breath back in, triggering the inhaler at same time you start breathing in.  Hold for up to 5 seconds if you can.  Rinse and gargle with water when done  Please schedule a follow up visit in 3 months but call sooner if needed

## 2014-01-23 NOTE — Assessment & Plan Note (Signed)
-   01/20/2014   Walked 2lpm  x one lap @ 185 stopped due to sob no desat

## 2014-05-11 ENCOUNTER — Ambulatory Visit (INDEPENDENT_AMBULATORY_CARE_PROVIDER_SITE_OTHER): Payer: Medicare Other | Admitting: Internal Medicine

## 2014-05-11 ENCOUNTER — Encounter: Payer: Self-pay | Admitting: Internal Medicine

## 2014-05-11 VITALS — BP 134/60 | HR 66 | Temp 98.0°F | Ht 66.0 in | Wt 105.0 lb

## 2014-05-11 DIAGNOSIS — R0902 Hypoxemia: Secondary | ICD-10-CM

## 2014-05-11 DIAGNOSIS — J9611 Chronic respiratory failure with hypoxia: Secondary | ICD-10-CM

## 2014-05-11 DIAGNOSIS — J449 Chronic obstructive pulmonary disease, unspecified: Secondary | ICD-10-CM

## 2014-05-11 DIAGNOSIS — J9610 Chronic respiratory failure, unspecified whether with hypoxia or hypercapnia: Secondary | ICD-10-CM

## 2014-05-11 NOTE — Assessment & Plan Note (Addendum)
-   PFTs 05/21/2012 0.69 (32) ratio 41 and 34% better p B2  Over using saba at baseline otherwise doing well  The proper method of use, as well as anticipated side effects, of a metered-dose inhaler are discussed and demonstrated to the patient. Improved effectiveness after extensive coaching during this visit to a level of approximately  90% so ok to continue Symbicort 160 2bid and spiriva as maint but only use saba prn if can't catch his breath (See instructions for specific recommendations which were reviewed directly with the patient who was given a copy with highlighter outlining the key components. )

## 2014-05-11 NOTE — Progress Notes (Signed)
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   rec Symbicort 160 Take 2 puffs first thing in am and then another 2 puffs about 12 hours later.  Stop advair Stay on spiriva Work on inhaler technique: . Please schedule a follow up office visit in 4 weeks, sooner if needed with pft's   05/21/2012 f/u ov/Grantham Hippert cc breathing better but still c/o doe eg  limiting from walking the dogs more than 15-20 min s stopping, very slow pace. rec Work on inhaler technique:but no change rx    11/13/2012 f/u ov/Lada Fulbright re copd  Chief Complaint  Patient presents with  . Follow-up    SOB progressivley worse since last visit, also c/o cough worse for the past 6 months, prod with white sputum   using saba 3 puffs per day with very poor technique plus neb, confused with how to use rescue, did not come with wife but says he does his own inhalers, she does his meds.  Doe x > 20 min walking dogs rec Plan A thru C reviewed in writing    12/25/2012 f/u ov/Ruthia Person re GOLD III COPD maint on symbicort/ spiriva and prn saba hfa Chief Complaint  Patient presents with  . Follow-up    Cough has pretty much resolved and his breathing is much improved since the last visit. No new co's today.    avg use couple times a week saba and really not limited from desired activities by sob >>no changes but reviewed action plan in writing  > did not follow   12/23/13 [facility] follow up  Patient returns for a post hospital followup. Patient was admitted April 18 through April 27 for acute respiratory failure due to COPD, exacerbation. He was treated with IV antibiotics, steroids, nebulized bronchodilators. CT chest showed a progressive scar in the right upper lobe with a new spiculated nodularity. With recommendations to follow up CT in 3 months. reports is doing well since discharge, except does report DOE (pursed lip breathing helps).    rec Finish Prednisone taper as directed.  Continue on Symbicort 2 puffs Twice daily   Continue on Spiriva 1 puff daily  Continue on Oxygen 2l/m continuously    01/20/2014 f/u ov/[name] re: GOLD III COPD/ now 02 dep 2lpm 24/7  Chief Complaint  Patient presents with  . Follow-up    Pt states that his breathing continues to improve. He has minimal cough with clear sputum that he relates to PND. He c/o green nasal d/c for the past several days.   symbicort/spiriva each am/ very confused with instructions/ names of meds / now has neb also and now on 02  rec Plan A = symbicort 160 Take 2 puffs first thing in am and then another 2 puffs about 12 hours later.  spiriva also each am Plan B= backup = proair up to 2 puff every 4 hours Plan C = Nebulizer use this up to every 4 hours if Plan B doesn't work Plan D = Doctor,  Call if needing more nebulizer treatments than usual  Work on inhaler technique   05/11/2014 f/u ov/[name] re: GOLD III COPD/ 02 dep Chief Complaint  Patient presents with  . Follow-up    Pt states that his breathing is doing well today, no new co's today.   uses neb once or twice a week  Uses hfa three x daily   walking a block and half to two on 2lpm   No obvious day to day or daytime variabilty or assoc cough/ cp or chest tightness, subjective wheeze overt sinus or hb symptoms. No unusual exp hx or h/o childhood pna/ asthma or knowledge of premature birth.  Sleeping ok without nocturnal   or early am exacerbation  of respiratory  c/o's or need for noct saba. Also denies any obvious fluctuation of symptoms with weather or environmental changes or other aggravating or alleviating factors except as outlined above   Current Medications, Allergies, Complete Past Medical History, Past Surgical History, Family History, and Social History were reviewed in [employer] record.  ROS  The following are not active complaints unless bolded sore throat, dysphagia, dental problems, itching, sneezing,  nasal congestion or excess/ purulent secretions, ear ache,   fever, chills, sweats, unintended wt loss, pleuritic or exertional cp, hemoptysis,  orthopnea pnd or leg swelling, presyncope, palpitations, heartburn, abdominal pain, anorexia, nausea, vomiting, diarrhea  or change in bowel or urinary habits, change in stools or urine, dysuria,hematuria,  rash, arthralgias, visual complaints, headache, numbness weakness or ataxia or problems with walking or coordination,  change in mood/affect or memory.                    Objective:   Physical Exam      edentulous thin bm nad walking with cane mildly   hoarse  05/11/2014       105  Wt Readings from Last 3 Encounters:  01/20/14 104 lb (47.174 kg)  12/23/13 109 lb 12.8 oz (49.805 kg)  12/03/13 111 lb 8.8 oz (50.6 kg)     HEENT mild turbinate edema.  Edentulous with dentures in place.Oropharynx no thrush or excess pnd or cobblestoning.  No JVD or cervical adenopathy. Mild accessory muscle hypertrophy. Trachea midline, nl thryroid. Chest was hyperinflated by percussion with diminished breath sounds and moderate increased exp time without wheeze. Hoover sign positive at mid inspiration. Regular rate and rhythm without murmur gallop or rub or increase P2 or edema.  Abd: no hsm, nl excursion. Ext warm without cyanosis or clubbing.       12/14/13 cxr Emphysema without acute cardiopulmonary disease or interval change  from  prior.     Assessment & Plan:

## 2014-05-11 NOTE — Assessment & Plan Note (Signed)
Started p admit 11/27/13 @ 2lpm  - 01/20/2014   Walked 2lpm  x one lap @ 185 stopped due to sob no desat   - 05/11/2014  Walked 2lpm @ slow to mod pace x  1 lap  @ 185 ft each stopped due to sob   Adequate control on present rx, reviewed > no change in rx needed

## 2014-05-11 NOTE — Patient Instructions (Addendum)
No change in your medications  Need to be sure the meds that are listed are correct and if not call us back with any corrections   Please schedule a follow up visit in 3 months but call sooner if needed

## 2014-07-21 ENCOUNTER — Encounter (HOSPITAL_COMMUNITY): Payer: Self-pay | Admitting: Cardiology

## 2014-09-07 DIAGNOSIS — J449 Chronic obstructive pulmonary disease, unspecified: Secondary | ICD-10-CM | POA: Diagnosis not present

## 2014-09-13 ENCOUNTER — Encounter: Payer: Self-pay | Admitting: Internal Medicine

## 2014-09-13 ENCOUNTER — Ambulatory Visit (INDEPENDENT_AMBULATORY_CARE_PROVIDER_SITE_OTHER): Payer: Medicare Other | Admitting: Internal Medicine

## 2014-09-13 VITALS — BP 138/58 | HR 60 | Ht 65.0 in | Wt 102.6 lb

## 2014-09-13 DIAGNOSIS — R911 Solitary pulmonary nodule: Secondary | ICD-10-CM | POA: Diagnosis not present

## 2014-09-13 DIAGNOSIS — J449 Chronic obstructive pulmonary disease, unspecified: Secondary | ICD-10-CM | POA: Diagnosis not present

## 2014-09-13 DIAGNOSIS — J9611 Chronic respiratory failure with hypoxia: Secondary | ICD-10-CM | POA: Diagnosis not present

## 2014-09-13 NOTE — Progress Notes (Signed)
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   rec Symbicort 160 Take 2 puffs first thing in am and then another 2 puffs about 12 hours later.  Stop advair Stay on spiriva Work on inhaler technique: . Please schedule a follow up office visit in 4 weeks, sooner if needed with pft's   05/21/2012 f/u ov/Cristian Collins cc breathing better but still c/o doe eg  limiting from walking the dogs more than 15-20 min s stopping, very slow pace. rec Work on inhaler technique:but no change rx    11/13/2012 f/u ov/Cristian Collins re copd  Chief Complaint  Patient presents with  . Follow-up    SOB progressivley worse since last visit, also c/o cough worse for the past 6 months, prod with white sputum   using saba 3 puffs per day with very poor technique plus neb, confused with how to use rescue, did not come with wife but says he does his own inhalers, she does his meds.  Doe x > 20 min walking dogs rec Plan A thru C reviewed in writing    12/25/2012 f/u ov/Cristian Collins re Collins III COPD maint on symbicort/ spiriva and prn saba hfa Chief Complaint  Patient presents with  . Follow-up    Cough has pretty much resolved and his breathing is much improved since the last visit. No new co's today.    avg use couple times a week saba and really not limited from desired activities by sob >>no changes but reviewed action plan in writing  > did not follow   12/23/13 [facility] follow up  Patient returns for a post hospital followup. Patient was admitted April 18 through April 27 for acute respiratory failure due to COPD, exacerbation. He was treated with IV antibiotics, steroids, nebulized bronchodilators. CT chest showed a progressive scar in the right upper lobe with a new spiculated nodularity. With recommendations to follow up CT in 3 months. reports is doing well since discharge, except does report DOE (pursed lip breathing helps).    rec Finish Prednisone taper as directed.  Continue on Symbicort 2 puffs Twice daily   Continue on Spiriva 1 puff daily  Continue on Oxygen 2l/m continuously    01/20/2014 f/u ov/Cristian Collins re: Collins III COPD/ now 02 dep 2lpm 24/7  Chief Complaint  Patient presents with  . Follow-up    Pt states that his breathing continues to improve. He has minimal cough with clear sputum that he relates to PND. He c/o green nasal d/c for the past several days.   symbicort/spiriva each am/ very confused with instructions/ names of meds / now has neb also and now on 02  rec Plan A = symbicort 160 Take 2 puffs first thing in am and then another 2 puffs about 12 hours later.  spiriva also each am Plan B= backup = proair up to 2 puff every 4 hours Plan C = Nebulizer use this up to every 4 hours if Plan B doesn't work Plan D = Doctor,  Call if needing more nebulizer treatments than usual  Work on inhaler technique   05/11/2014 f/u ov/Cristian Collins re: Collins III COPD/ 02 dep Chief Complaint  Patient presents with  . Follow-up    Pt states that his breathing is doing well today, no new co's today.   uses neb once or twice a week  Uses hfa three x daily   walking a block and half to two on 2lpm  rec No change in your medications Need to be sure the meds that are listed are correct and if not call us back with any corrections    09/13/2014 f/u ov/Cristian Collins re: Collins III copd// 02 dep  Chief Complaint  Patient presents with  . Follow-up     Pt states that his breathing is doing well today. No new co's. He is using proair 2-3 x per day. He uses neb maybe once per wk.  On symbicort / spiriva still able to walk 2 blocks on 02 2lpm   No obvious day to day or daytime variabilty or assoc cough/ cp or chest tightness, subjective wheeze overt sinus or hb symptoms. No unusual exp hx or h/o childhood pna/ asthma or knowledge of premature birth.  Sleeping ok without nocturnal  or early am exacerbation  of respiratory  c/o's or need for noct saba. Also denies any obvious fluctuation of symptoms with weather or environmental changes or other aggravating or alleviating factors except as outlined above   Current Medications, Allergies, Complete Past Medical History, Past Surgical History, Family History, and Social History were reviewed in [employer] record.  ROS  The following are not active complaints unless bolded sore throat, dysphagia, dental problems, itching, sneezing,  nasal congestion or excess/ purulent secretions, ear ache,   fever, chills, sweats, unintended wt loss, pleuritic or exertional cp, hemoptysis,  orthopnea pnd or leg swelling, presyncope, palpitations, heartburn, abdominal pain, anorexia, nausea, vomiting, diarrhea  or change in bowel or urinary habits, change in stools or urine, dysuria,hematuria,  rash, arthralgias, visual complaints, headache, numbness weakness or ataxia or problems with walking or coordination,  change in mood/affect or memory.                    Objective:   Physical Exam      edentulous thin bm nad walking with cane mildly   hoarse  05/11/2014       105  > 09/13/2014  103  Wt Readings from Last 3 Encounters:  01/20/14 104 lb (47.174 kg)  12/23/13 109 lb 12.8 oz (49.805 kg)  12/03/13 111 lb 8.8 oz (50.6 kg)     HEENT mild turbinate edema.  Edentulous with dentures in place.Oropharynx no thrush or excess pnd or cobblestoning.  No JVD or cervical adenopathy. Mild  accessory muscle hypertrophy. Trachea midline, nl thryroid. Chest was hyperinflated by percussion with diminished breath sounds and moderate increased exp time without wheeze. Hoover sign positive at mid inspiration. Regular rate and rhythm without murmur gallop or rub or increase P2 or edema.  Abd: no hsm, nl excursion. Ext warm without cyanosis or clubbing.       12/14/13 cxr Emphysema without acute cardiopulmonary disease or interval change  from prior.     Assessment & Plan:

## 2014-09-13 NOTE — Patient Instructions (Signed)
No change medications  Please schedule a follow up visit in 3 months but call sooner if needed

## 2014-09-18 ENCOUNTER — Encounter: Payer: Self-pay | Admitting: Internal Medicine

## 2014-09-18 NOTE — Assessment & Plan Note (Addendum)
-   PFTs 05/21/2012 0.69 (32) ratio 41 and 34% better p B2  Severe but relatively stable copd on present rx   The proper method of use, as well as anticipated side effects, of a metered-dose inhaler are discussed and demonstrated to the patient. Improved effectiveness after extensive coaching during this visit to a level of approximately  90%  Adequate control on present rx, reviewed > no change in rx needed

## 2014-09-18 NOTE — Assessment & Plan Note (Signed)
Started p admit 11/27/13 @ 2lpm  - 01/20/2014   Walked 2lpm  x one lap @ 185 stopped due to sob no desat   - 05/11/2014  Walked 2lpm pulsed  x one lap @ 185 stopped due to sob/ no desat     Adequate control on present rx, reviewed > no change in rx needed

## 2014-09-18 NOTE — Assessment & Plan Note (Signed)
11/28/2013 >CT chest showed a progressive scar in the right upper lobe with a new spiculated nodularity.  - rec repeat CT 11/29/14 placed in tickle file   Clearly not a surgical candidate though could consider RT if proves to be malignancy

## 2014-09-21 DIAGNOSIS — D638 Anemia in other chronic diseases classified elsewhere: Secondary | ICD-10-CM | POA: Diagnosis not present

## 2014-09-21 DIAGNOSIS — R0602 Shortness of breath: Secondary | ICD-10-CM | POA: Diagnosis not present

## 2014-09-21 DIAGNOSIS — I2510 Atherosclerotic heart disease of native coronary artery without angina pectoris: Secondary | ICD-10-CM | POA: Diagnosis not present

## 2014-09-21 DIAGNOSIS — I2781 Cor pulmonale (chronic): Secondary | ICD-10-CM | POA: Diagnosis not present

## 2014-10-05 DIAGNOSIS — I10 Essential (primary) hypertension: Secondary | ICD-10-CM | POA: Diagnosis not present

## 2014-10-05 DIAGNOSIS — D649 Anemia, unspecified: Secondary | ICD-10-CM | POA: Diagnosis not present

## 2014-10-05 DIAGNOSIS — R627 Adult failure to thrive: Secondary | ICD-10-CM | POA: Diagnosis not present

## 2014-10-05 DIAGNOSIS — E1165 Type 2 diabetes mellitus with hyperglycemia: Secondary | ICD-10-CM | POA: Diagnosis not present

## 2014-10-07 ENCOUNTER — Emergency Department (HOSPITAL_COMMUNITY): Payer: Medicare Other

## 2014-10-07 ENCOUNTER — Encounter (HOSPITAL_COMMUNITY): Payer: Self-pay

## 2014-10-07 ENCOUNTER — Inpatient Hospital Stay (HOSPITAL_COMMUNITY)
Admission: EM | Admit: 2014-10-07 | Discharge: 2014-10-14 | DRG: 190 | Disposition: A | Discharge status: Home Health | Payer: Medicare Other | Attending: Internal Medicine | Admitting: Internal Medicine

## 2014-10-07 DIAGNOSIS — J441 Chronic obstructive pulmonary disease with (acute) exacerbation: Principal | ICD-10-CM | POA: Diagnosis present

## 2014-10-07 DIAGNOSIS — I10 Essential (primary) hypertension: Secondary | ICD-10-CM | POA: Diagnosis present

## 2014-10-07 DIAGNOSIS — R0600 Dyspnea, unspecified: Secondary | ICD-10-CM

## 2014-10-07 DIAGNOSIS — R0989 Other specified symptoms and signs involving the circulatory and respiratory systems: Secondary | ICD-10-CM | POA: Diagnosis not present

## 2014-10-07 DIAGNOSIS — D649 Anemia, unspecified: Secondary | ICD-10-CM | POA: Diagnosis not present

## 2014-10-07 DIAGNOSIS — I2510 Atherosclerotic heart disease of native coronary artery without angina pectoris: Secondary | ICD-10-CM | POA: Diagnosis not present

## 2014-10-07 DIAGNOSIS — J9601 Acute respiratory failure with hypoxia: Secondary | ICD-10-CM | POA: Diagnosis not present

## 2014-10-07 DIAGNOSIS — J449 Chronic obstructive pulmonary disease, unspecified: Secondary | ICD-10-CM | POA: Diagnosis not present

## 2014-10-07 DIAGNOSIS — N179 Acute kidney failure, unspecified: Secondary | ICD-10-CM | POA: Diagnosis not present

## 2014-10-07 DIAGNOSIS — Z9981 Dependence on supplemental oxygen: Secondary | ICD-10-CM

## 2014-10-07 DIAGNOSIS — E872 Acidosis: Secondary | ICD-10-CM | POA: Diagnosis present

## 2014-10-07 DIAGNOSIS — Z7982 Long term (current) use of aspirin: Secondary | ICD-10-CM | POA: Diagnosis not present

## 2014-10-07 DIAGNOSIS — E785 Hyperlipidemia, unspecified: Secondary | ICD-10-CM | POA: Diagnosis present

## 2014-10-07 DIAGNOSIS — K219 Gastro-esophageal reflux disease without esophagitis: Secondary | ICD-10-CM | POA: Diagnosis present

## 2014-10-07 DIAGNOSIS — I5032 Chronic diastolic (congestive) heart failure: Secondary | ICD-10-CM | POA: Diagnosis present

## 2014-10-07 DIAGNOSIS — R079 Chest pain, unspecified: Secondary | ICD-10-CM | POA: Diagnosis not present

## 2014-10-07 DIAGNOSIS — J9621 Acute and chronic respiratory failure with hypoxia: Secondary | ICD-10-CM | POA: Diagnosis not present

## 2014-10-07 DIAGNOSIS — I272 Other secondary pulmonary hypertension: Secondary | ICD-10-CM | POA: Diagnosis present

## 2014-10-07 DIAGNOSIS — J479 Bronchiectasis, uncomplicated: Secondary | ICD-10-CM | POA: Diagnosis not present

## 2014-10-07 DIAGNOSIS — Z681 Body mass index (BMI) 19 or less, adult: Secondary | ICD-10-CM

## 2014-10-07 DIAGNOSIS — I739 Peripheral vascular disease, unspecified: Secondary | ICD-10-CM | POA: Diagnosis present

## 2014-10-07 DIAGNOSIS — D509 Iron deficiency anemia, unspecified: Secondary | ICD-10-CM | POA: Diagnosis not present

## 2014-10-07 DIAGNOSIS — I4892 Unspecified atrial flutter: Secondary | ICD-10-CM | POA: Diagnosis present

## 2014-10-07 DIAGNOSIS — J9611 Chronic respiratory failure with hypoxia: Secondary | ICD-10-CM | POA: Diagnosis not present

## 2014-10-07 DIAGNOSIS — R911 Solitary pulmonary nodule: Secondary | ICD-10-CM

## 2014-10-07 DIAGNOSIS — E860 Dehydration: Secondary | ICD-10-CM | POA: Diagnosis present

## 2014-10-07 DIAGNOSIS — R05 Cough: Secondary | ICD-10-CM | POA: Diagnosis not present

## 2014-10-07 DIAGNOSIS — Z950 Presence of cardiac pacemaker: Secondary | ICD-10-CM

## 2014-10-07 DIAGNOSIS — R0602 Shortness of breath: Secondary | ICD-10-CM | POA: Diagnosis not present

## 2014-10-07 DIAGNOSIS — J9600 Acute respiratory failure, unspecified whether with hypoxia or hypercapnia: Secondary | ICD-10-CM | POA: Diagnosis not present

## 2014-10-07 DIAGNOSIS — Z87891 Personal history of nicotine dependence: Secondary | ICD-10-CM | POA: Diagnosis not present

## 2014-10-07 DIAGNOSIS — E43 Unspecified severe protein-calorie malnutrition: Secondary | ICD-10-CM | POA: Diagnosis not present

## 2014-10-07 HISTORY — DX: Acute kidney failure, unspecified: N17.9

## 2014-10-07 LAB — I-STAT TROPONIN, ED: TROPONIN I, POC: 0.01 ng/mL (ref 0.00–0.08)

## 2014-10-07 LAB — I-STAT CG4 LACTIC ACID, ED
LACTIC ACID, VENOUS: 2.11 mmol/L — AB (ref 0.5–2.0)
Lactic Acid, Venous: 2.2 mmol/L (ref 0.5–2.0)

## 2014-10-07 LAB — COMPREHENSIVE METABOLIC PANEL
ALBUMIN: 3.6 g/dL (ref 3.5–5.2)
ALK PHOS: 97 U/L (ref 39–117)
ALT: 15 U/L (ref 0–53)
AST: 28 U/L (ref 0–37)
Anion gap: 7 (ref 5–15)
BUN: 15 mg/dL (ref 6–23)
CHLORIDE: 102 mmol/L (ref 96–112)
CO2: 28 mmol/L (ref 19–32)
Calcium: 8.6 mg/dL (ref 8.4–10.5)
Creatinine, Ser: 1.16 mg/dL (ref 0.50–1.35)
GFR calc Af Amer: 66 mL/min — ABNORMAL LOW (ref >90)
GFR calc non Af Amer: 57 mL/min — ABNORMAL LOW (ref >90)
Glucose, Bld: 126 mg/dL — ABNORMAL HIGH (ref 70–99)
POTASSIUM: 3.4 mmol/L — AB (ref 3.5–5.1)
Sodium: 137 mmol/L (ref 135–145)
TOTAL PROTEIN: 6.5 g/dL (ref 6.0–8.3)
Total Bilirubin: 0.5 mg/dL (ref 0.3–1.2)

## 2014-10-07 LAB — I-STAT ARTERIAL BLOOD GAS, ED
Acid-Base Excess: 4 mmol/L — ABNORMAL HIGH (ref 0.0–2.0)
BICARBONATE: 29.7 meq/L — AB (ref 20.0–24.0)
O2 Saturation: 98 %
PCO2 ART: 51.6 mmHg — AB (ref 35.0–45.0)
PH ART: 7.368 (ref 7.350–7.450)
TCO2: 31 mmol/L (ref 0–100)
pO2, Arterial: 113 mmHg — ABNORMAL HIGH (ref 80.0–100.0)

## 2014-10-07 LAB — BRAIN NATRIURETIC PEPTIDE: B Natriuretic Peptide: 72 pg/mL (ref 0.0–100.0)

## 2014-10-07 LAB — CBC
HCT: 28.5 % — ABNORMAL LOW (ref 39.0–52.0)
Hemoglobin: 8.9 g/dL — ABNORMAL LOW (ref 13.0–17.0)
MCH: 26 pg (ref 26.0–34.0)
MCHC: 31.2 g/dL (ref 30.0–36.0)
MCV: 83.3 fL (ref 78.0–100.0)
Platelets: 213 10*3/uL (ref 150–400)
RBC: 3.42 MIL/uL — ABNORMAL LOW (ref 4.22–5.81)
RDW: 15.5 % (ref 11.5–15.5)
WBC: 7.7 10*3/uL (ref 4.0–10.5)

## 2014-10-07 MED ORDER — ALBUTEROL SULFATE (2.5 MG/3ML) 0.083% IN NEBU
2.5000 mg | INHALATION_SOLUTION | Freq: Four times a day (QID) | RESPIRATORY_TRACT | Status: DC
  Administered 2014-10-08: 2.5 mg via RESPIRATORY_TRACT
  Filled 2014-10-07: qty 3

## 2014-10-07 MED ORDER — IPRATROPIUM-ALBUTEROL 0.5-2.5 (3) MG/3ML IN SOLN
3.0000 mL | RESPIRATORY_TRACT | Status: DC
  Administered 2014-10-07: 3 mL via RESPIRATORY_TRACT
  Filled 2014-10-07: qty 3

## 2014-10-07 MED ORDER — METHYLPREDNISOLONE SODIUM SUCC 125 MG IJ SOLR: 125.0000 mg | Freq: Once | INTRAMUSCULAR | Status: DC

## 2014-10-07 MED ORDER — ACETAMINOPHEN 325 MG PO TABS
650.0000 mg | ORAL_TABLET | Freq: Four times a day (QID) | ORAL | Status: DC | PRN
  Administered 2014-10-09 – 2014-10-10 (×3): 650 mg via ORAL
  Filled 2014-10-07 (×3): qty 2

## 2014-10-07 MED ORDER — LEVOFLOXACIN IN D5W 750 MG/150ML IV SOLN
750.0000 mg | Freq: Once | INTRAVENOUS | Status: AC
  Administered 2014-10-07: 750 mg via INTRAVENOUS
  Filled 2014-10-07: qty 150

## 2014-10-07 MED ORDER — IPRATROPIUM-ALBUTEROL 0.5-2.5 (3) MG/3ML IN SOLN
3.0000 mL | Freq: Once | RESPIRATORY_TRACT | Status: AC
  Administered 2014-10-07: 3 mL via RESPIRATORY_TRACT

## 2014-10-07 MED ORDER — ONDANSETRON HCL 4 MG/2ML IJ SOLN: 4.0000 mg | Freq: Four times a day (QID) | INTRAMUSCULAR | Status: DC | PRN

## 2014-10-07 MED ORDER — ALBUTEROL SULFATE (2.5 MG/3ML) 0.083% IN NEBU
2.5000 mg | INHALATION_SOLUTION | RESPIRATORY_TRACT | Status: DC | PRN
  Administered 2014-10-08: 2.5 mg via RESPIRATORY_TRACT
  Filled 2014-10-07 (×2): qty 3

## 2014-10-07 MED ORDER — GUAIFENESIN ER 600 MG PO TB12
600.0000 mg | ORAL_TABLET | Freq: Two times a day (BID) | ORAL | Status: DC
  Administered 2014-10-07 – 2014-10-10 (×6): 600 mg via ORAL
  Filled 2014-10-07 (×6): qty 1

## 2014-10-07 MED ORDER — ENSURE COMPLETE PO LIQD
237.0000 mL | Freq: Three times a day (TID) | ORAL | Status: DC
  Administered 2014-10-07 – 2014-10-14 (×18): 237 mL via ORAL

## 2014-10-07 MED ORDER — SODIUM CHLORIDE 0.9 % IJ SOLN
3.0000 mL | Freq: Two times a day (BID) | INTRAMUSCULAR | Status: DC
  Administered 2014-10-07 – 2014-10-14 (×9): 3 mL via INTRAVENOUS

## 2014-10-07 MED ORDER — BUDESONIDE-FORMOTEROL FUMARATE 160-4.5 MCG/ACT IN AERO
2.0000 | INHALATION_SPRAY | Freq: Two times a day (BID) | RESPIRATORY_TRACT | Status: DC
  Administered 2014-10-08 – 2014-10-10 (×5): 2 via RESPIRATORY_TRACT
  Filled 2014-10-07: qty 6

## 2014-10-07 MED ORDER — ALBUTEROL (5 MG/ML) CONTINUOUS INHALATION SOLN
10.0000 mg/h | INHALATION_SOLUTION | Freq: Once | RESPIRATORY_TRACT | Status: AC
  Administered 2014-10-07: 10 mg/h via RESPIRATORY_TRACT
  Filled 2014-10-07: qty 20

## 2014-10-07 MED ORDER — ALBUTEROL SULFATE (2.5 MG/3ML) 0.083% IN NEBU: 2.5000 mg | INHALATION_SOLUTION | RESPIRATORY_TRACT | Status: DC | PRN

## 2014-10-07 MED ORDER — PANTOPRAZOLE SODIUM 40 MG PO TBEC
40.0000 mg | DELAYED_RELEASE_TABLET | Freq: Every day | ORAL | Status: DC
  Administered 2014-10-08 – 2014-10-11 (×4): 40 mg via ORAL
  Filled 2014-10-07 (×4): qty 1

## 2014-10-07 MED ORDER — SODIUM CHLORIDE 0.9 % IJ SOLN: 3.0000 mL | INTRAMUSCULAR | Status: DC | PRN

## 2014-10-07 MED ORDER — METHYLPREDNISOLONE SODIUM SUCC 125 MG IJ SOLR
60.0000 mg | Freq: Four times a day (QID) | INTRAMUSCULAR | Status: DC
  Administered 2014-10-07 – 2014-10-10 (×11): 60 mg via INTRAVENOUS
  Filled 2014-10-07 (×12): qty 2

## 2014-10-07 MED ORDER — ISOSORBIDE MONONITRATE ER 60 MG PO TB24
60.0000 mg | ORAL_TABLET | Freq: Every day | ORAL | Status: DC
  Administered 2014-10-08 – 2014-10-14 (×7): 60 mg via ORAL
  Filled 2014-10-07 (×7): qty 1

## 2014-10-07 MED ORDER — POTASSIUM CHLORIDE CRYS ER 20 MEQ PO TBCR
40.0000 meq | EXTENDED_RELEASE_TABLET | Freq: Once | ORAL | Status: AC
  Administered 2014-10-07: 40 meq via ORAL
  Filled 2014-10-07: qty 2

## 2014-10-07 MED ORDER — IPRATROPIUM-ALBUTEROL 0.5-2.5 (3) MG/3ML IN SOLN: 3.0000 mL | Freq: Four times a day (QID) | RESPIRATORY_TRACT | Status: DC

## 2014-10-07 MED ORDER — LEVOFLOXACIN IN D5W 500 MG/100ML IV SOLN
500.0000 mg | INTRAVENOUS | Status: DC
  Administered 2014-10-08: 500 mg via INTRAVENOUS
  Filled 2014-10-07: qty 100

## 2014-10-07 MED ORDER — ASPIRIN EC 81 MG PO TBEC
81.0000 mg | DELAYED_RELEASE_TABLET | Freq: Every day | ORAL | Status: DC
  Administered 2014-10-08: 81 mg via ORAL
  Filled 2014-10-07: qty 1

## 2014-10-07 MED ORDER — CETYLPYRIDINIUM CHLORIDE 0.05 % MT LIQD
7.0000 mL | Freq: Two times a day (BID) | OROMUCOSAL | Status: DC
  Administered 2014-10-07 – 2014-10-14 (×11): 7 mL via OROMUCOSAL

## 2014-10-07 MED ORDER — IPRATROPIUM BROMIDE 0.02 % IN SOLN
1.0000 mg | Freq: Once | RESPIRATORY_TRACT | Status: AC
  Administered 2014-10-07: 1 mg via RESPIRATORY_TRACT
  Filled 2014-10-07: qty 5

## 2014-10-07 MED ORDER — SODIUM CHLORIDE 0.9 % IV SOLN: 250.0000 mL | INTRAVENOUS | Status: DC | PRN

## 2014-10-07 MED ORDER — ACETAMINOPHEN 650 MG RE SUPP: 650.0000 mg | Freq: Four times a day (QID) | RECTAL | Status: DC | PRN

## 2014-10-07 MED ORDER — ONDANSETRON HCL 4 MG PO TABS: 4.0000 mg | ORAL_TABLET | Freq: Four times a day (QID) | ORAL | Status: DC | PRN

## 2014-10-07 MED ORDER — METOPROLOL SUCCINATE ER 25 MG PO TB24
25.0000 mg | ORAL_TABLET | Freq: Every day | ORAL | Status: DC
  Administered 2014-10-08 – 2014-10-14 (×7): 25 mg via ORAL
  Filled 2014-10-07 (×7): qty 1

## 2014-10-07 MED ORDER — FERROUS FUMARATE 325 (106 FE) MG PO TABS
1.0000 | ORAL_TABLET | Freq: Every day | ORAL | Status: DC
  Administered 2014-10-08: 106 mg via ORAL
  Filled 2014-10-07 (×2): qty 1

## 2014-10-07 MED ORDER — SIMVASTATIN 20 MG PO TABS
20.0000 mg | ORAL_TABLET | Freq: Every day | ORAL | Status: DC
  Administered 2014-10-08 – 2014-10-14 (×7): 20 mg via ORAL
  Filled 2014-10-07 (×7): qty 1

## 2014-10-07 MED ORDER — AMLODIPINE BESYLATE 10 MG PO TABS
10.0000 mg | ORAL_TABLET | Freq: Every day | ORAL | Status: DC
  Administered 2014-10-08 – 2014-10-14 (×7): 10 mg via ORAL
  Filled 2014-10-07 (×7): qty 1

## 2014-10-07 MED ORDER — IPRATROPIUM BROMIDE 0.02 % IN SOLN
0.5000 mg | Freq: Four times a day (QID) | RESPIRATORY_TRACT | Status: DC
  Administered 2014-10-08: 0.5 mg via RESPIRATORY_TRACT
  Filled 2014-10-07: qty 2.5

## 2014-10-07 MED ORDER — ENOXAPARIN SODIUM 40 MG/0.4ML ~~LOC~~ SOLN
40.0000 mg | SUBCUTANEOUS | Status: DC
  Administered 2014-10-07: 40 mg via SUBCUTANEOUS
  Filled 2014-10-07: qty 0.4

## 2014-10-07 NOTE — ED Notes (Signed)
Pt given water and a dinner tray has been ordered

## 2014-10-07 NOTE — ED Notes (Signed)
Coughing intermittently

## 2014-10-07 NOTE — ED Notes (Signed)
Pt remains monitored by blood pressure, pulse ox, and 5 lead. pts family remains at bedside.  

## 2014-10-07 NOTE — ED Notes (Signed)
Pt placed on monitor upon arrival to room. Pt monitored by blood pressure, pulse ox, and 12 lead.

## 2014-10-07 NOTE — ED Notes (Signed)
Dr Vanita Panda given results for lactic acid 2.11

## 2014-10-07 NOTE — ED Notes (Signed)
Pt remains monitored by blood pressure, pulse ox, and 5 lead. PTs family remains at bedside.

## 2014-10-07 NOTE — ED Notes (Signed)
Patient here for sob over the last week. Pt with hx of chf and copd, pt has productive cough, is on home o2, pt took albuterol at home with no relief, ems gave duoneb and solumedrol and now has wheezing in upper and rales in lower and ems states an increased work of breathing. Pt coughing up large sputum on arrival.

## 2014-10-07 NOTE — ED Notes (Signed)
The pt feels like his breathing is better at present

## 2014-10-07 NOTE — ED Notes (Signed)
The pt has been placed on bi-pap following his return from xray.  Alert no visible distress at present.  Wife at the bedside.  Getting lab work drawn

## 2014-10-07 NOTE — ED Notes (Signed)
Admitting doctor at the bedside 

## 2014-10-07 NOTE — Progress Notes (Signed)
RT Note- Patient taken off BIPAP, put on 4lpm [place].

## 2014-10-07 NOTE — ED Notes (Signed)
The pts dinner tray has arrived
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
## 2014-10-07 NOTE — ED Notes (Signed)
The pt is off bi-pap at present coughing intermittently

## 2014-10-07 NOTE — ED Notes (Signed)
Breathing ok at present.. Coughing sl when he received  Some water

## 2014-10-07 NOTE — ED Notes (Signed)
Bed assignment has been changed

## 2014-10-07 NOTE — ED Notes (Signed)
Dr Alvino Chapel given a copy of lactic acid results 2.20
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
## 2014-10-07 NOTE — ED Notes (Signed)
Report called to rn on 3w 

## 2014-10-07 NOTE — ED Notes (Addendum)
Pt placed on monitor upon return to room from radiology. Pt remains monitored by blood pressure, pulse ox, and 5 lead.  

## 2014-10-07 NOTE — Procedures (Signed)
Pt refuses bipap.  PT is comfortable on 3L [place].  RT will continue to monitor pt and if any distress noted, pt then agrees to be placed on bipap.

## 2014-10-08 ENCOUNTER — Encounter (HOSPITAL_COMMUNITY): Payer: Self-pay | Admitting: Internal Medicine

## 2014-10-08 DIAGNOSIS — D649 Anemia, unspecified: Secondary | ICD-10-CM | POA: Diagnosis present

## 2014-10-08 DIAGNOSIS — N179 Acute kidney failure, unspecified: Secondary | ICD-10-CM

## 2014-10-08 HISTORY — DX: Acute kidney failure, unspecified: N17.9

## 2014-10-08 LAB — COMPREHENSIVE METABOLIC PANEL
ALBUMIN: 3.2 g/dL — AB (ref 3.5–5.2)
ALT: 14 U/L (ref 0–53)
ANION GAP: 10 (ref 5–15)
AST: 23 U/L (ref 0–37)
Alkaline Phosphatase: 83 U/L (ref 39–117)
BUN: 23 mg/dL (ref 6–23)
CHLORIDE: 97 mmol/L (ref 96–112)
CO2: 29 mmol/L (ref 19–32)
CREATININE: 1.43 mg/dL — AB (ref 0.50–1.35)
Calcium: 8.9 mg/dL (ref 8.4–10.5)
GFR, EST AFRICAN AMERICAN: 51 mL/min — AB (ref >90)
GFR, EST NON AFRICAN AMERICAN: 44 mL/min — AB (ref >90)
GLUCOSE: 160 mg/dL — AB (ref 70–99)
Potassium: 4.5 mmol/L (ref 3.5–5.1)
Sodium: 136 mmol/L (ref 135–145)
TOTAL PROTEIN: 6 g/dL (ref 6.0–8.3)
Total Bilirubin: 0.2 mg/dL — ABNORMAL LOW (ref 0.3–1.2)

## 2014-10-08 LAB — CREATININE, URINE, RANDOM: CREATININE, URINE: 58.93 mg/dL

## 2014-10-08 LAB — CBC
HEMATOCRIT: 24.8 % — AB (ref 39.0–52.0)
Hemoglobin: 7.8 g/dL — ABNORMAL LOW (ref 13.0–17.0)
MCH: 26.5 pg (ref 26.0–34.0)
MCHC: 31.5 g/dL (ref 30.0–36.0)
MCV: 84.4 fL (ref 78.0–100.0)
PLATELETS: 199 10*3/uL (ref 150–400)
RBC: 2.94 MIL/uL — AB (ref 4.22–5.81)
RDW: 15.6 % — AB (ref 11.5–15.5)
WBC: 5 10*3/uL (ref 4.0–10.5)

## 2014-10-08 LAB — URINALYSIS, ROUTINE W REFLEX MICROSCOPIC
Bilirubin Urine: NEGATIVE
GLUCOSE, UA: 100 mg/dL — AB
HGB URINE DIPSTICK: NEGATIVE
KETONES UR: NEGATIVE mg/dL
LEUKOCYTES UA: NEGATIVE
NITRITE: NEGATIVE
PH: 5 (ref 5.0–8.0)
PROTEIN: NEGATIVE mg/dL
Specific Gravity, Urine: 1.014 (ref 1.005–1.030)
Urobilinogen, UA: 0.2 mg/dL (ref 0.0–1.0)

## 2014-10-08 LAB — IRON AND TIBC
Iron: 120 ug/dL (ref 42–165)
Saturation Ratios: 30 % (ref 20–55)
TIBC: 394 ug/dL (ref 215–435)
UIBC: 274 ug/dL (ref 125–400)

## 2014-10-08 LAB — OCCULT BLOOD X 1 CARD TO LAB, STOOL: FECAL OCCULT BLD: NEGATIVE

## 2014-10-08 LAB — TSH: TSH: 0.524 u[IU]/mL (ref 0.350–4.500)

## 2014-10-08 LAB — SODIUM, URINE, RANDOM: Sodium, Ur: 18 mmol/L

## 2014-10-08 LAB — MRSA PCR SCREENING: MRSA by PCR: NEGATIVE

## 2014-10-08 MED ORDER — SODIUM CHLORIDE 0.9 % IV SOLN
INTRAVENOUS | Status: DC
  Administered 2014-10-08: 75 mL/h via INTRAVENOUS
  Administered 2014-10-09: 06:00:00 via INTRAVENOUS

## 2014-10-08 MED ORDER — LEVOFLOXACIN 250 MG PO TABS
250.0000 mg | ORAL_TABLET | Freq: Every day | ORAL | Status: AC
  Administered 2014-10-09 – 2014-10-13 (×5): 250 mg via ORAL
  Filled 2014-10-08 (×5): qty 1

## 2014-10-08 MED ORDER — IPRATROPIUM-ALBUTEROL 0.5-2.5 (3) MG/3ML IN SOLN
3.0000 mL | Freq: Four times a day (QID) | RESPIRATORY_TRACT | Status: DC
  Administered 2014-10-08 – 2014-10-11 (×13): 3 mL via RESPIRATORY_TRACT
  Filled 2014-10-08 (×15): qty 3

## 2014-10-08 MED ORDER — LEVOFLOXACIN IN D5W 250 MG/50ML IV SOLN: 250.0000 mg | INTRAVENOUS | Status: DC

## 2014-10-08 MED ORDER — ENOXAPARIN SODIUM 30 MG/0.3ML ~~LOC~~ SOLN: 30.0000 mg | SUBCUTANEOUS | Status: DC

## 2014-10-08 NOTE — Progress Notes (Signed)
[facility] PROGRESS NOTE  Cristian Collins VOZ:[id number] DOB: 1930/07/20 DOA: 10/07/2014 PCP: No primary care provider on file.  Brief Summary  The patient is an 81 year old male with history of COPD, coronary artery disease, hypertension, hyperlipidemia, peripheral vascular disease, chronic diastolic heart failure, peptic ulcer disease, anemia, who presented with shortness of breath. He is on home O2 for his COPD and heart failure. He describes coughing up additional phlegm but denied fevers and chills. He was admitted for COPD exacerbation and started on Cymetra, DuoNeb's and antibiotics.  His course has been complicated by progressive anemia and acute kidney injury.  Assessment/Plan  COPD exacerbation - continue Solu-Medrol60 mg IV every 6 hours today -  Continue DuoNeb nebulizers every 6 hours, Mucinex DM 1 tablet twice a day -  Transition to oral levofloxacin  Normocytic anemia, had some dark streaks in stools last week. No obvious reason to suspect hemolysis -  Check iron studies, B12, folate, TSH and Hemoccult stool -  Repeat hemoglobin in a.m., transfuse for hemoglobin less than 7 or for symptomatical anemia -  D/c lovenox and start SCDs  Acute kidney injury -  Urinalysis and fractional excretion of sodium:  Consistent with dehydration -  May also have a component of steroid-induced elevation of BUN and creatinine -  Start IV fluids  Hx of a-fib s/p ablation and pacemaker placement -  Tele:  NSR -  D/c telemetry  Hypertension Continue amlodipine 10 mg daily  CAD -  Continue statin, metoprolol, isosorbide -  Hold ASA pending w/u for progressive anemia, possible GIB  Hyperlipidemia Continue Zocor  Severe protein calorie malnutrition -  Nutrition consult -  Supplements -  Liberalize diet  Diet:  regular Access:  PIV IVF:  Yes Proph:  SCDs  Code Status: Full Family Communication: patient and wife Disposition Plan: pending improvement in  breathing   Consultants: None  Procedures:  chest x-ray   Antibiotics:  Levofloxacin from 2/26   HPI/Subjective:  Still SOB but better.  Has chest tightness with breathing but this is also easing some.  Feels thirsty.  Had some dark streaks in stools a week ago but no obvious blood.    Objective: Filed Vitals:   10/08/14 0440 10/08/14 0755 10/08/14 0949 10/08/14 1118  BP: 141/58  118/75 155/59  Pulse: 83  92 92  Temp: 97.9 F (36.6 C) 97.8 F (36.6 C)  98.7 F (37.1 C)  TempSrc:  Oral  Oral  Resp:    31  Height:      Weight: 45.8 kg (100 lb 15.5 oz)     SpO2:    100%    Intake/Output Summary (Last 24 hours) at 10/08/14 1421 Last data filed at 10/08/14 0900  Gross per 24 hour  Intake    240 ml  Output    900 ml  Net   -660 ml   Filed Weights   10/07/14 1437 10/07/14 2030 10/08/14 0440  Weight: 46.04 kg (101 lb 8 oz) 45.813 kg (101 lb) 45.8 kg (100 lb 15.5 oz)    Exam:   General:  Cachectic male, No acute distress  HEENT:  NCAT, MMM  Cardiovascular:  RRR, nl S1, S2 , 1/6 systolic murmur RSB/LSB, 2+ pulses, warm extremities  Respiratory:  Diminished bilaterally with wheeze, prolonged I:E, no focal rales or rhonchi, no increased WOB  Abdomen:   NABS, soft, NT/ND  MSK:   Normal tone and bulk, no LEE  Neuro:  Grossly intact  Data Reviewed: Basic Metabolic Panel:  Recent Labs Lab 10/07/14 1538 10/08/14 0415  NA 137 136  K 3.4* 4.5  CL 102 97  CO2 28 29  GLUCOSE 126* 160*  BUN 15 23  CREATININE 1.16 1.43*  CALCIUM 8.6 8.9   Liver Function Tests:  Recent Labs Lab 10/07/14 1538 10/08/14 0415  AST 28 23  ALT 15 14  ALKPHOS 97 83  BILITOT 0.5 0.2*  PROT 6.5 6.0  ALBUMIN 3.6 3.2*   No results for input(s): LIPASE, AMYLASE in the last 168 hours. No results for input(s): AMMONIA in the last 168 hours. CBC:  Recent Labs Lab 10/07/14 1538 10/08/14 0415  WBC 7.7 5.0  HGB 8.9* 7.8*  HCT 28.5* 24.8*  MCV 83.3 84.4  PLT 213 199    Cardiac Enzymes: No results for input(s): CKTOTAL, CKMB, CKMBINDEX, TROPONINI in the last 168 hours. BNP (last 3 results)  Recent Labs  10/07/14 1538  BNP 72.0    ProBNP (last 3 results)  Recent Labs  11/27/13 1405  PROBNP 287.0    CBG: No results for input(s): GLUCAP in the last 168 hours.  Recent Results (from the past 240 hour(s))  Culture, respiratory (NON-Expectorated)     Status: None (Preliminary result)   Collection Time: 10/07/14  5:09 PM  Result Value Ref Range Status   Specimen Description SPUTUM  Final   Special Requests NONE  Final   Gram Stain   Final    FEW WBC PRESENT,BOTH PMN AND MONONUCLEAR RARE SQUAMOUS EPITHELIAL CELLS PRESENT MODERATE GRAM POSITIVE COCCI IN PAIRS IN CLUSTERS RARE GRAM POSITIVE RODS Performed at [facility]    Culture   Final    Culture reincubated for better growth Performed at [facility]    Report Status PENDING  Incomplete  MRSA PCR Screening     Status: None   Collection Time: 10/07/14 11:10 PM  Result Value Ref Range Status   MRSA by PCR NEGATIVE NEGATIVE Final    Comment:        The GeneXpert MRSA Assay (FDA approved for NASAL specimens only), is one component of a comprehensive MRSA colonization surveillance program. It is not intended to diagnose MRSA infection nor to guide or monitor treatment for MRSA infections.      Studies: Dg Chest 2 View (if Patient Has Fever And/or Copd)  10/07/2014   CLINICAL DATA:  One week history of cough and chest pain ; congestion. Underlying COPD  EXAM: CHEST  2 VIEW  COMPARISON:  Dec 14, 2013  FINDINGS: There is underlying emphysematous change. There is no edema or consolidation. Heart size is normal. Pulmonary vascularity suggests a degree of pulmonary arterial hypertension. No adenopathy. No bone lesions. There are surgical clips in the gastroesophageal junction region.  IMPRESSION: Underlying emphysematous change with a questionable degree of pulmonary  arterial hypertension. No edema or consolidation.   Electronically Signed   By: Lowella Grip III M.D.   On: 10/07/2014 15:14    Scheduled Meds: . amLODipine  10 mg Oral Daily  . antiseptic oral rinse  7 mL Mouth Rinse BID  . aspirin EC  81 mg Oral Daily  . budesonide-formoterol  2 puff Inhalation BID  . enoxaparin (LOVENOX) injection  30 mg Subcutaneous Q24H  . feeding supplement (ENSURE COMPLETE)  237 mL Oral TID BM  . ferrous fumarate  1 tablet Oral Daily  . guaiFENesin  600 mg Oral BID  . ipratropium-albuterol  3 mL Nebulization Q6H  . isosorbide mononitrate  60 mg Oral Daily  . [  START ON 10/09/2014] levofloxacin (LEVAQUIN) IV  250 mg Intravenous Q24H  . methylPREDNISolone (SOLU-MEDROL) injection  60 mg Intravenous Q6H  . metoprolol succinate  25 mg Oral Daily  . pantoprazole  40 mg Oral Daily  . simvastatin  20 mg Oral Daily  . sodium chloride  3 mL Intravenous Q12H   Continuous Infusions: . sodium chloride      Active Problems:   COPD exacerbation   Protein-calorie malnutrition, severe   Essential hypertension   CAD (coronary artery disease)    Time spent: 30 min    Karel Mowers, [place] [facility] Pager [phone]. If 7PM-7AM, please contact night-coverage at [email or web address], password [facility] 10/08/2014, 2:21 PM  LOS: 1 day
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
## 2014-10-08 NOTE — Progress Notes (Signed)
Spoke to Dr [name] who reviewed the ekg, and says it is NSR. No atrial flutter.

## 2014-10-09 ENCOUNTER — Inpatient Hospital Stay (HOSPITAL_COMMUNITY): Payer: Medicare Other

## 2014-10-09 DIAGNOSIS — D649 Anemia, unspecified: Secondary | ICD-10-CM

## 2014-10-09 DIAGNOSIS — D509 Iron deficiency anemia, unspecified: Secondary | ICD-10-CM | POA: Diagnosis present

## 2014-10-09 LAB — CULTURE, RESPIRATORY: CULTURE: NORMAL

## 2014-10-09 LAB — CBC
HEMATOCRIT: 25.5 % — AB (ref 39.0–52.0)
Hemoglobin: 8.5 g/dL — ABNORMAL LOW (ref 13.0–17.0)
MCH: 27.1 pg (ref 26.0–34.0)
MCHC: 33.3 g/dL (ref 30.0–36.0)
MCV: 81.2 fL (ref 78.0–100.0)
Platelets: 173 10*3/uL (ref 150–400)
RBC: 3.14 MIL/uL — ABNORMAL LOW (ref 4.22–5.81)
RDW: 16.1 % — AB (ref 11.5–15.5)
WBC: 11.6 10*3/uL — ABNORMAL HIGH (ref 4.0–10.5)

## 2014-10-09 LAB — FERRITIN: Ferritin: 15 ng/mL — ABNORMAL LOW (ref 22–322)

## 2014-10-09 LAB — BASIC METABOLIC PANEL
Anion gap: 9 (ref 5–15)
BUN: 24 mg/dL — ABNORMAL HIGH (ref 6–23)
CHLORIDE: 100 mmol/L (ref 96–112)
CO2: 27 mmol/L (ref 19–32)
CREATININE: 1.21 mg/dL (ref 0.50–1.35)
Calcium: 9 mg/dL (ref 8.4–10.5)
GFR calc Af Amer: 63 mL/min — ABNORMAL LOW (ref >90)
GFR, EST NON AFRICAN AMERICAN: 54 mL/min — AB (ref >90)
Glucose, Bld: 159 mg/dL — ABNORMAL HIGH (ref 70–99)
Potassium: 4.8 mmol/L (ref 3.5–5.1)
Sodium: 136 mmol/L (ref 135–145)

## 2014-10-09 LAB — VITAMIN B12: Vitamin B-12: 594 pg/mL (ref 211–911)

## 2014-10-09 LAB — CULTURE, RESPIRATORY W GRAM STAIN

## 2014-10-09 LAB — TRANSFERRIN: Transferrin: 339 mg/dL (ref 200–370)

## 2014-10-09 MED ORDER — BENZONATATE 100 MG PO CAPS
100.0000 mg | ORAL_CAPSULE | Freq: Once | ORAL | Status: AC
  Administered 2014-10-09: 100 mg via ORAL
  Filled 2014-10-09: qty 1

## 2014-10-09 MED ORDER — ASPIRIN EC 81 MG PO TBEC
81.0000 mg | DELAYED_RELEASE_TABLET | Freq: Every day | ORAL | Status: DC
  Administered 2014-10-09 – 2014-10-14 (×6): 81 mg via ORAL
  Filled 2014-10-09 (×6): qty 1

## 2014-10-09 MED ORDER — BENZONATATE 100 MG PO CAPS
100.0000 mg | ORAL_CAPSULE | Freq: Three times a day (TID) | ORAL | Status: DC
  Administered 2014-10-09 – 2014-10-10 (×3): 100 mg via ORAL
  Filled 2014-10-09 (×3): qty 1

## 2014-10-09 MED ORDER — CHRONIC LUNG DISEASE PATIENT EDUCATION BOOK
Freq: Once | Status: AC
  Administered 2014-10-09: 11:00:00
  Filled 2014-10-09: qty 1

## 2014-10-09 MED ORDER — TRAMADOL HCL 50 MG PO TABS: 50.0000 mg | ORAL_TABLET | Freq: Four times a day (QID) | ORAL | Status: DC | PRN

## 2014-10-09 MED ORDER — FERROUS FUMARATE 325 (106 FE) MG PO TABS
1.0000 | ORAL_TABLET | Freq: Two times a day (BID) | ORAL | Status: DC
  Administered 2014-10-09 – 2014-10-14 (×11): 106 mg via ORAL
  Filled 2014-10-09 (×13): qty 1

## 2014-10-09 NOTE — Progress Notes (Signed)
Pt with some left sided chest pain due to coughing. Pt given tylenol and notified T. Rogue Bussing, NP for orders for cough med. Pt medicated with Tessalon. Will cont to monitor.

## 2014-10-09 NOTE — Progress Notes (Addendum)
[facility] PROGRESS NOTE  Cristian Collins OHY:[id number] DOB: 04-02-1930 DOA: 10/07/2014 PCP: No primary care provider on file.  Brief Summary  The patient is an 81 year old male with history of COPD, coronary artery disease, hypertension, hyperlipidemia, peripheral vascular disease, chronic diastolic heart failure, peptic ulcer disease, anemia, who presented with shortness of breath. He is on home O2 for his COPD and heart failure. He describes coughing up additional phlegm but denied fevers and chills. He was admitted for COPD exacerbation and started on Cymetra, DuoNeb's and antibiotics.  His course has been complicated by progressive anemia and acute kidney injury.  Assessment/Plan  COPD exacerbation, still very SOB -  continue Solu-Medrol60 mg IV every 6 hours  -  Continue DuoNeb nebulizers every 6 hours -  Start tessalon -  Continue Mucinex DM 1 tablet twice a day -  Continue oral levofloxacin, day 3 of 5 -  Repeat CXR  Musculoskeletal pain -  Trial of tramadol  Iron deficiency anemia, had some dark streaks in stools last week. Hemoglobin stable around 8.5mg /dl -  Increase iron supplementation -  Occult negative but would stillr ecommend follow up with cardiology -  B12 wnl -  TSH wnl -  Folate pending -  Okay to resume aspirin  Acute kidney injury, creatinine trended down to 1.2 with IVF -  Urinalysis and fractional excretion of sodium:  Consistent with dehydration -  May also have a component of steroid-induced elevation of BUN and creatinine -  D/c IVF  Hx of a-fib s/p ablation and pacemaker placement -  Tele:  NSR, tele now off  Hypertension Continue amlodipine 10 mg daily  CAD -  Continue statin, metoprolol, isosorbide -  Resume asa  Hyperlipidemia Continue Zocor  Severe protein calorie malnutrition -  Nutrition consult -  Supplements -  Liberalize diet  Leukocytosis, likely steroid-induced  Diet:  regular Access:  PIV IVF:  off Proph:   SCDs  Code Status: Full Family Communication: patient and wife Disposition Plan: pending improvement in breathing   Consultants: None  Procedures:  chest x-ray   Antibiotics:  Levofloxacin from 2/26   HPI/Subjective:  Still SOB and coughing up copious secretions.  Had episode of confusion overnight.    Objective: Filed Vitals:   10/09/14 0609 10/09/14 0800 10/09/14 0943 10/09/14 1026  BP:  161/66  151/68  Pulse:  94    Temp:  98 F (36.7 C)    TempSrc:  Oral    Resp:  22    Height:      Weight: 45.859 kg (101 lb 1.6 oz)     SpO2:  97% 99%     Intake/Output Summary (Last 24 hours) at 10/09/14 1056 Last data filed at 10/09/14 1043  Gross per 24 hour  Intake 1906.75 ml  Output   1400 ml  Net 506.75 ml   Filed Weights   10/07/14 2030 10/08/14 0440 10/09/14 0609  Weight: 45.813 kg (101 lb) 45.8 kg (100 lb 15.5 oz) 45.859 kg (101 lb 1.6 oz)    Exam:   General:  Cachectic male, No acute distress  HEENT:  NCAT, MMM  Cardiovascular:  RRR, nl S1, S2 , 1/6 systolic murmur RSB/LSB, 2+ pulses, warm extremities  Respiratory:  Diminished bilaterally with wheeze, prolonged I:E, course rales and rhonchi at bases, no increased WOB  Abdomen:   NABS, soft, NT/ND  MSK:   Normal tone and bulk, no LEE  Neuro:  Grossly intact  Data Reviewed: Basic Metabolic Panel:  Recent Labs  Lab 10/07/14 1538 10/08/14 0415 10/09/14 0700  NA 137 136 136  K 3.4* 4.5 4.8  CL 102 97 100  CO2 28 29 27   GLUCOSE 126* 160* 159*  BUN 15 23 24*  CREATININE 1.16 1.43* 1.21  CALCIUM 8.6 8.9 9.0   Liver Function Tests:  Recent Labs Lab 10/07/14 1538 10/08/14 0415  AST 28 23  ALT 15 14  ALKPHOS 97 83  BILITOT 0.5 0.2*  PROT 6.5 6.0  ALBUMIN 3.6 3.2*   No results for input(s): LIPASE, AMYLASE in the last 168 hours. No results for input(s): AMMONIA in the last 168 hours. CBC:  Recent Labs Lab 10/07/14 1538 10/08/14 0415 10/09/14 0700  WBC 7.7 5.0 11.6*  HGB 8.9* 7.8*  8.5*  HCT 28.5* 24.8* 25.5*  MCV 83.3 84.4 81.2  PLT 213 199 173   Cardiac Enzymes: No results for input(s): CKTOTAL, CKMB, CKMBINDEX, TROPONINI in the last 168 hours. BNP (last 3 results)  Recent Labs  10/07/14 1538  BNP 72.0    ProBNP (last 3 results)  Recent Labs  11/27/13 1405  PROBNP 287.0    CBG: No results for input(s): GLUCAP in the last 168 hours.  Recent Results (from the past 240 hour(s))  Culture, respiratory (NON-Expectorated)     Status: None (Preliminary result)   Collection Time: 10/07/14  5:09 PM  Result Value Ref Range Status   Specimen Description SPUTUM  Final   Special Requests NONE  Final   Gram Stain   Final    FEW WBC PRESENT,BOTH PMN AND MONONUCLEAR RARE SQUAMOUS EPITHELIAL CELLS PRESENT MODERATE GRAM POSITIVE COCCI IN PAIRS IN CLUSTERS RARE GRAM POSITIVE RODS Performed at [facility]    Culture   Final    Culture reincubated for better growth Performed at [facility]    Report Status PENDING  Incomplete  MRSA PCR Screening     Status: None   Collection Time: 10/07/14 11:10 PM  Result Value Ref Range Status   MRSA by PCR NEGATIVE NEGATIVE Final    Comment:        The GeneXpert MRSA Assay (FDA approved for NASAL specimens only), is one component of a comprehensive MRSA colonization surveillance program. It is not intended to diagnose MRSA infection nor to guide or monitor treatment for MRSA infections.      Studies: Dg Chest 2 View (if Patient Has Fever And/or Copd)  10/07/2014   CLINICAL DATA:  One week history of cough and chest pain ; congestion. Underlying COPD  EXAM: CHEST  2 VIEW  COMPARISON:  Dec 14, 2013  FINDINGS: There is underlying emphysematous change. There is no edema or consolidation. Heart size is normal. Pulmonary vascularity suggests a degree of pulmonary arterial hypertension. No adenopathy. No bone lesions. There are surgical clips in the gastroesophageal junction region.  IMPRESSION:  Underlying emphysematous change with a questionable degree of pulmonary arterial hypertension. No edema or consolidation.   Electronically Signed   By: Lowella Grip III M.D.   On: 10/07/2014 15:14    Scheduled Meds: . amLODipine  10 mg Oral Daily  . antiseptic oral rinse  7 mL Mouth Rinse BID  . budesonide-formoterol  2 puff Inhalation BID  . feeding supplement (ENSURE COMPLETE)  237 mL Oral TID BM  . ferrous fumarate  1 tablet Oral BID  . guaiFENesin  600 mg Oral BID  . ipratropium-albuterol  3 mL Nebulization Q6H  . isosorbide mononitrate  60 mg Oral Daily  . levofloxacin  250 mg Oral Daily  . methylPREDNISolone (SOLU-MEDROL) injection  60 mg Intravenous Q6H  . metoprolol succinate  25 mg Oral Daily  . pantoprazole  40 mg Oral Daily  . simvastatin  20 mg Oral Daily  . sodium chloride  3 mL Intravenous Q12H   Continuous Infusions: . sodium chloride 75 mL/hr at 10/09/14 0607    Active Problems:   COPD exacerbation   Protein-calorie malnutrition, severe   Essential hypertension   CAD (coronary artery disease)   AKI (acute kidney injury)   Normocytic anemia    Time spent: 30 min    Toni Hoffmeister, [place] [facility] Pager [phone]. If 7PM-7AM, please contact night-coverage at [email or web address], password [facility] 10/09/2014, 10:56 AM  LOS: 2 days

## 2014-10-10 LAB — FOLATE RBC
FOLATE, HEMOLYSATE: 492.2 ng/mL
FOLATE, RBC: 1893 ng/mL (ref >498)
Hematocrit: 26 % — ABNORMAL LOW (ref 37.5–51.0)

## 2014-10-10 MED ORDER — BENZONATATE 100 MG PO CAPS
200.0000 mg | ORAL_CAPSULE | Freq: Three times a day (TID) | ORAL | Status: DC
  Administered 2014-10-10 – 2014-10-13 (×9): 200 mg via ORAL
  Filled 2014-10-10 (×9): qty 2

## 2014-10-10 MED ORDER — ARFORMOTEROL TARTRATE 15 MCG/2ML IN NEBU
15.0000 ug | INHALATION_SOLUTION | Freq: Two times a day (BID) | RESPIRATORY_TRACT | Status: DC
  Filled 2014-10-10 (×2): qty 2

## 2014-10-10 MED ORDER — ARFORMOTEROL TARTRATE 15 MCG/2ML IN NEBU
15.0000 ug | INHALATION_SOLUTION | Freq: Two times a day (BID) | RESPIRATORY_TRACT | Status: DC
  Administered 2014-10-10 – 2014-10-14 (×6): 15 ug via RESPIRATORY_TRACT
  Filled 2014-10-10 (×13): qty 2

## 2014-10-10 MED ORDER — BUDESONIDE 0.5 MG/2ML IN SUSP
0.5000 mg | Freq: Two times a day (BID) | RESPIRATORY_TRACT | Status: DC
  Administered 2014-10-10 – 2014-10-14 (×7): 0.5 mg via RESPIRATORY_TRACT
  Filled 2014-10-10 (×8): qty 2

## 2014-10-10 MED ORDER — MORPHINE SULFATE (CONCENTRATE) 10 MG/0.5ML PO SOLN
10.0000 mg | ORAL | Status: DC | PRN
  Administered 2014-10-11 (×2): 10 mg via ORAL
  Filled 2014-10-10 (×2): qty 0.5

## 2014-10-10 MED ORDER — DM-GUAIFENESIN ER 30-600 MG PO TB12
1.0000 | ORAL_TABLET | Freq: Two times a day (BID) | ORAL | Status: DC
  Administered 2014-10-10 – 2014-10-11 (×3): 1 via ORAL
  Filled 2014-10-10 (×3): qty 1

## 2014-10-10 MED ORDER — METHYLPREDNISOLONE SODIUM SUCC 40 MG IJ SOLR
40.0000 mg | Freq: Four times a day (QID) | INTRAMUSCULAR | Status: DC
  Administered 2014-10-10 – 2014-10-12 (×8): 40 mg via INTRAVENOUS
  Filled 2014-10-10 (×8): qty 1

## 2014-10-10 MED ORDER — BUDESONIDE 0.5 MG/2ML IN SUSP: 0.5000 mg | Freq: Two times a day (BID) | RESPIRATORY_TRACT | Status: DC

## 2014-10-10 NOTE — Progress Notes (Signed)
UR Completed Tyquisha Sharps Graves-Bigelow, RN,BSN [phone]  

## 2014-10-10 NOTE — Progress Notes (Addendum)
[facility] PROGRESS NOTE  Cristian Collins EHU:[id number] DOB: [birth year] DOA: 10/07/2014 PCP: No primary care provider on file.  Brief Summary  The patient is an 81 year old male with history of COPD, coronary artery disease, hypertension, hyperlipidemia, peripheral vascular disease, chronic diastolic heart failure, peptic ulcer disease, anemia, who presented with shortness of breath. He is on home O2 for his COPD and heart failure. He describes coughing up additional phlegm but denied fevers and chills. He was admitted for COPD exacerbation and started on Cymetra, DuoNeb's and antibiotics.  His course has been complicated by progressive anemia and acute kidney injury.  Assessment/Plan  COPD exacerbation, still very SOB -  Change to Solu-Medrol40 mg IV every 6 hours  -  Continue DuoNeb nebulizers every 6 hours -  Increase tessalon -  Change to Mucinex DM twice a day -  Add prn morphine for cough -  Continue oral levofloxacin, day 4 of 5 -  Repeat CXR: no interstitial edema or acute changes -  Change LABA/ICS to nebulized versions  Musculoskeletal pain -  D/c tramadol -  Start low dose morphine  Iron deficiency anemia, had some dark streaks in stools last week. Hemoglobin stable around 8.5mg /dl -  Continue increased iron supplementation -  Occult negative -  Given severity of breathing issues, would defer GI evaluation for now -  B12 wnl -  TSH wnl -  Folate wnl -  Okay to resume aspirin  Acute kidney injury, creatinine trended down to 1.2 with IVF -  Urinalysis and fractional excretion of sodium:  Consistent with dehydration -  May also have a component of steroid-induced elevation of BUN and creatinine  Hx of a-fib s/p ablation and pacemaker placement -  Tele:  NSR, tele now off  Hypertension Continue amlodipine 10 mg daily  CAD -  Continue statin, metoprolol, isosorbide, ASA  Hyperlipidemia Continue Zocor  Severe protein calorie malnutrition -  Nutrition  consult -  Supplements -  Liberalize diet  Leukocytosis, likely steroid-induced  Diet:  regular Access:  PIV IVF:  off Proph:  SCDs  Code Status: Full Family Communication: patient and wife Disposition Plan: pending improvement in breathing.  If not improved tomorrow, PULM consult.  Sent Dr. Melvyn Novas a note today regarding admission.    Consultants: None  Procedures:  chest x-ray   Antibiotics:  Levofloxacin from 2/26   HPI/Subjective:  Still SOB and coughing up copious secretions.  Feels he is having longer improvement after nebs today.  Had episode of confusion again overnight.  Chest wall hurts from coughing  Objective: Filed Vitals:   10/10/14 0237 10/10/14 0442 10/10/14 0535 10/10/14 0721  BP:  163/99 149/83   Pulse:  93 83   Temp:  97.5 F (36.4 C)    TempSrc:  Oral    Resp:  22    Height:      Weight:  45.36 kg (100 lb)    SpO2: 100% 100% 98% 100%    Intake/Output Summary (Last 24 hours) at 10/10/14 1250 Last data filed at 10/10/14 0932  Gross per 24 hour  Intake    843 ml  Output    850 ml  Net     -7 ml   Filed Weights   10/08/14 0440 10/09/14 0609 10/10/14 0442  Weight: 45.8 kg (100 lb 15.5 oz) 45.859 kg (101 lb 1.6 oz) 45.36 kg (100 lb)    Exam:   General:  Cachectic male, No acute distress  HEENT:  NCAT, MMM  Cardiovascular:  RRR, nl S1, S2 , 1/6 systolic murmur RSB/LSB, 2+ pulses, warm extremities  Respiratory:  Diminished bilaterally with wheeze, prolonged I:E, course rhonchi, no increased WOB  Abdomen:   NABS, soft, NT/ND  MSK:   Normal tone and bulk, no LEE  Neuro:  Grossly intact  Data Reviewed: Basic Metabolic Panel:  Recent Labs Lab 10/07/14 1538 10/08/14 0415 10/09/14 0700  NA 137 136 136  K 3.4* 4.5 4.8  CL 102 97 100  CO2 28 29 27   GLUCOSE 126* 160* 159*  BUN 15 23 24*  CREATININE 1.16 1.43* 1.21  CALCIUM 8.6 8.9 9.0   Liver Function Tests:  Recent Labs Lab 10/07/14 1538 10/08/14 0415  AST 28 23  ALT  15 14  ALKPHOS 97 83  BILITOT 0.5 0.2*  PROT 6.5 6.0  ALBUMIN 3.6 3.2*   No results for input(s): LIPASE, AMYLASE in the last 168 hours. No results for input(s): AMMONIA in the last 168 hours. CBC:  Recent Labs Lab 10/07/14 1538 10/08/14 0415 10/09/14 0700  WBC 7.7 5.0 11.6*  HGB 8.9* 7.8* 8.5*  HCT 28.5* 24.8* 25.5*  MCV 83.3 84.4 81.2  PLT 213 199 173   Cardiac Enzymes: No results for input(s): CKTOTAL, CKMB, CKMBINDEX, TROPONINI in the last 168 hours. BNP (last 3 results)  Recent Labs  10/07/14 1538  BNP 72.0    ProBNP (last 3 results)  Recent Labs  11/27/13 1405  PROBNP 287.0    CBG: No results for input(s): GLUCAP in the last 168 hours.  Recent Results (from the past 240 hour(s))  Culture, respiratory (NON-Expectorated)     Status: None   Collection Time: 10/07/14  5:09 PM  Result Value Ref Range Status   Specimen Description SPUTUM  Final   Special Requests NONE  Final   Gram Stain   Final    FEW WBC PRESENT,BOTH PMN AND MONONUCLEAR RARE SQUAMOUS EPITHELIAL CELLS PRESENT MODERATE GRAM POSITIVE COCCI IN PAIRS IN CLUSTERS RARE GRAM POSITIVE RODS Performed at [facility]    Culture   Final    NORMAL OROPHARYNGEAL FLORA Performed at [facility]    Report Status 10/09/2014 FINAL  Final  MRSA PCR Screening     Status: None   Collection Time: 10/07/14 11:10 PM  Result Value Ref Range Status   MRSA by PCR NEGATIVE NEGATIVE Final    Comment:        The GeneXpert MRSA Assay (FDA approved for NASAL specimens only), is one component of a comprehensive MRSA colonization surveillance program. It is not intended to diagnose MRSA infection nor to guide or monitor treatment for MRSA infections.      Studies: Dg Chest Port 1 View  10/09/2014   CLINICAL DATA:  Shortness of breath  EXAM: PORTABLE CHEST - 1 VIEW  COMPARISON:  11/05/2014  FINDINGS: Chronic interstitial markings/emphysematous changes with hyperinflation. No pleural  effusion or pneumothorax.  The heart is normal in size.  IMPRESSION: No evidence of acute cardiopulmonary disease.   Electronically Signed   By: Julian Hy M.D.   On: 10/09/2014 12:48    Scheduled Meds: . amLODipine  10 mg Oral Daily  . antiseptic oral rinse  7 mL Mouth Rinse BID  . arformoterol  15 mcg Nebulization BID  . aspirin EC  81 mg Oral Daily  . benzonatate  200 mg Oral TID  . budesonide (PULMICORT) nebulizer solution  0.5 mg Nebulization BID  . dextromethorphan-guaiFENesin  1 tablet Oral BID  . feeding supplement (  ENSURE COMPLETE)  237 mL Oral TID BM  . ferrous fumarate  1 tablet Oral BID  . ipratropium-albuterol  3 mL Nebulization Q6H  . isosorbide mononitrate  60 mg Oral Daily  . levofloxacin  250 mg Oral Daily  . methylPREDNISolone (SOLU-MEDROL) injection  40 mg Intravenous Q6H  . metoprolol succinate  25 mg Oral Daily  . pantoprazole  40 mg Oral Daily  . simvastatin  20 mg Oral Daily  . sodium chloride  3 mL Intravenous Q12H   Continuous Infusions:    Active Problems:   COPD exacerbation   Protein-calorie malnutrition, severe   Essential hypertension   CAD (coronary artery disease)   AKI (acute kidney injury)   Normocytic anemia   Anemia, iron deficiency    Time spent: 30 min    Shahara Hartsfield, [place] [facility] Pager [phone]. If 7PM-7AM, please contact night-coverage at [email or web address], password [facility] 10/10/2014, 12:50 PM  LOS: 3 days

## 2014-10-10 NOTE — Care Management Note (Addendum)
    Page 1 of 2   10/14/2014     12:02:46 PM CARE MANAGEMENT NOTE 10/14/2014  Patient:  MCCABE, GLORIA   Account Number:  [id number]  Date Initiated:  10/10/2014  Documentation initiated by:  GRAVES-BIGELOW,Atif Chapple  Subjective/Objective Assessment:   Pt admitted for COPD exacerbation. Initiated on Bipap on admission. Pt is from home with wife.     Action/Plan:   CM did receive referral for Medication assistance. CM unable to assist due to pt has insurance. CM did provide pt information on the Medicare [place]. Will continue to f/u.   Anticipated DC Date:  10/12/2014   Anticipated DC Plan:  [place]  CM consult      [facility] Choice  HOME HEALTH   Choice offered to / List presented to:  [name]        [place] arranged  HH-1 RN  [place] OT      [place].   Status of service:  Completed, signed off Medicare Important Message given?  YES (If response is "NO", the following Medicare IM given date fields will be blank) Date Medicare IM given:  10/10/2014 Medicare IM given by:  GRAVES-BIGELOW,Caylon Saine Date Additional Medicare IM given:  10/14/2014 Additional Medicare IM given by:  [place]  Discharge Disposition:  [place]  Per UR Regulation:  Reviewed for med. necessity/level of care/duration of stay  If discussed at [place] of Stay Meetings, dates discussed:   10/13/2014    Comments:  10-12-14 1218 Jacqlyn Krauss, RN,BSN [phone] CM did speak with pt and wife in regards to [facility] services. Pt has DME 02 at home and wants to use [facility]. CM did make referral to [facility] for services listed above. Pt will need HH orders and f22f once medically stabe for d/c. No further needs from CM at this time.   10-11-14 [address] [place] [phone] CM did speak with pt and wife in regards to referral received for COPD Referral.  Pt will benefit from [facility] for disease and medication management. Pt has [facility] telephonic services set up at home. Pt is from home with wife. He uses [employer] and states co pays are expensive. CM did provide pt with the [place] number to call for assistance with co pays. CM will try to see if pt will qualify for assistance via the company with inhalers through a program called house to home. CM will continue to monitor for disposition needs.

## 2014-10-11 ENCOUNTER — Inpatient Hospital Stay (HOSPITAL_COMMUNITY): Payer: Medicare Other

## 2014-10-11 DIAGNOSIS — I2510 Atherosclerotic heart disease of native coronary artery without angina pectoris: Secondary | ICD-10-CM

## 2014-10-11 DIAGNOSIS — I10 Essential (primary) hypertension: Secondary | ICD-10-CM

## 2014-10-11 DIAGNOSIS — E43 Unspecified severe protein-calorie malnutrition: Secondary | ICD-10-CM

## 2014-10-11 LAB — BLOOD GAS, ARTERIAL
ACID-BASE EXCESS: 1.8 mmol/L (ref 0.0–2.0)
BICARBONATE: 26.2 meq/L — AB (ref 20.0–24.0)
DRAWN BY: 27733
O2 Content: 2 L/min
O2 Saturation: 97.4 %
PATIENT TEMPERATURE: 98.6
TCO2: 27.6 mmol/L (ref 0–100)
pCO2 arterial: 43.9 mmHg (ref 35.0–45.0)
pH, Arterial: 7.394 (ref 7.350–7.450)
pO2, Arterial: 130 mmHg — ABNORMAL HIGH (ref 80.0–100.0)

## 2014-10-11 LAB — BASIC METABOLIC PANEL
ANION GAP: 8 (ref 5–15)
BUN: 21 mg/dL (ref 6–23)
CHLORIDE: 97 mmol/L (ref 96–112)
CO2: 29 mmol/L (ref 19–32)
CREATININE: 1.07 mg/dL (ref 0.50–1.35)
Calcium: 8.8 mg/dL (ref 8.4–10.5)
GFR calc non Af Amer: 63 mL/min — ABNORMAL LOW (ref >90)
GFR, EST AFRICAN AMERICAN: 73 mL/min — AB (ref >90)
Glucose, Bld: 169 mg/dL — ABNORMAL HIGH (ref 70–99)
POTASSIUM: 4.5 mmol/L (ref 3.5–5.1)
SODIUM: 134 mmol/L — AB (ref 135–145)

## 2014-10-11 LAB — PROTEIN ELECTROPHORESIS, SERUM
ALBUMIN ELP: 55.6 % — AB (ref 55.8–66.1)
ALPHA-1-GLOBULIN: 4.2 % (ref 2.9–4.9)
Alpha-2-Globulin: 13.9 % — ABNORMAL HIGH (ref 7.1–11.8)
BETA 2: 5.7 % (ref 3.2–6.5)
BETA GLOBULIN: 8.2 % — AB (ref 4.7–7.2)
Gamma Globulin: 12.4 % (ref 11.1–18.8)
M-Spike, %: NOT DETECTED g/dL
TOTAL PROTEIN ELP: 6.7 g/dL (ref 6.0–8.3)

## 2014-10-11 LAB — UIFE/LIGHT CHAINS/TP QN, 24-HR UR
ALPHA 1 UR: DETECTED — AB
Albumin, U: DETECTED
Alpha 2, Urine: DETECTED — AB
BETA UR: DETECTED — AB
GAMMA UR: DETECTED — AB
Total Protein, Urine: 9 mg/dL (ref 5–25)

## 2014-10-11 LAB — BRAIN NATRIURETIC PEPTIDE: B NATRIURETIC PEPTIDE 5: 124.6 pg/mL — AB (ref 0.0–100.0)

## 2014-10-11 MED ORDER — HYDROCOD POLST-CHLORPHEN POLST 10-8 MG/5ML PO LQCR
5.0000 mL | Freq: Two times a day (BID) | ORAL | Status: DC | PRN
  Administered 2014-10-11 – 2014-10-12 (×2): 5 mL via ORAL
  Filled 2014-10-11 (×2): qty 5

## 2014-10-11 MED ORDER — IPRATROPIUM-ALBUTEROL 0.5-2.5 (3) MG/3ML IN SOLN
3.0000 mL | Freq: Three times a day (TID) | RESPIRATORY_TRACT | Status: DC
  Administered 2014-10-12 (×2): 3 mL via RESPIRATORY_TRACT
  Filled 2014-10-11 (×2): qty 3

## 2014-10-11 MED ORDER — PANTOPRAZOLE SODIUM 40 MG PO TBEC
40.0000 mg | DELAYED_RELEASE_TABLET | Freq: Two times a day (BID) | ORAL | Status: DC
  Administered 2014-10-11 – 2014-10-14 (×6): 40 mg via ORAL
  Filled 2014-10-11 (×6): qty 1

## 2014-10-11 MED ORDER — FUROSEMIDE 10 MG/ML IJ SOLN
40.0000 mg | Freq: Four times a day (QID) | INTRAMUSCULAR | Status: AC
  Administered 2014-10-11 (×2): 40 mg via INTRAVENOUS
  Filled 2014-10-11 (×2): qty 4

## 2014-10-11 NOTE — Evaluation (Signed)
Physical Therapy Evaluation Patient Details Name: Cristian Collins MRN: [id number] DOB: 01-10-1930 Today's Date: 10/11/2014   History of Present Illness  Patient is a 81 y/o male who presents with increasing SOB. Admited with COPD exacerbation and A-flutter. Pt on chronic 02 with hx of CHF, COPD, CAD, HTN, HLD, PVD, PUD and anemia.     Clinical Impression  Patient presents with dyspnea on exertion, balance deficits, generalized weakness and impaired cardiovascular endurance. Education provided on energy conservation techniques and performing short bouts of activity with longer rest breaks at home. Pt reports wife is present 24/7 and able to assist as needed. Pt would benefit from skilled PT to improve gait, balance, endurance and safe mobility so pt can maximize independence and minimize fall risk prior to return home. If wife is not able to provide necessary 24/7 S, pt may need ST SNF.     Follow Up Recommendations Home health PT;Supervision/Assistance - 24 hour    Equipment Recommendations  None recommended by PT    Recommendations for Other Services       Precautions / Restrictions Precautions Precautions: Fall Precaution Comments: COPD GOLD protocol Restrictions Weight Bearing Restrictions: No      Mobility  Bed Mobility Overal bed mobility: Needs Assistance Bed Mobility: Supine to Sit;Sit to Supine     Supine to sit: Modified independent (Device/Increase time);HOB elevated Sit to supine: Modified independent (Device/Increase time);HOB elevated   General bed mobility comments: Use of rails for support. No physical assist needed.  Transfers Overall transfer level: Needs assistance Equipment used: Straight cane Transfers: Sit to/from Stand Sit to Stand: Min guard         General transfer comment: Min guard for safety as pt using bed rail to support LEs upon standing, unsteady. Stood from [employer].   Ambulation/Gait Ambulation/Gait assistance: Min assist Ambulation  Distance (Feet): 75 Feet Assistive device: Straight cane Gait Pattern/deviations: Step-to pattern;Decreased stride length;Trunk flexed   Gait velocity interpretation: Below normal speed for age/gender General Gait Details: Pt with slow and unsteady gait. Min A due to LOB x2. Dyspnea present on 2L 02 [place] however Sa02 ~99% and HR 76 bpm. 2 short standing rest breaks with cues for pursed lip breathing.  Stairs            Wheelchair Mobility    Modified Rankin (Stroke Patients Only)       Balance Overall balance assessment: Needs assistance;History of Falls Sitting-balance support: Feet supported;No upper extremity supported Sitting balance-Leahy Scale: Good     Standing balance support: During functional activity Standing balance-Leahy Scale: Poor Standing balance comment: Requires UE support for balance. LOB when trying to donn gown requiring Min A.                             Pertinent Vitals/Pain Pain Assessment: No/denies pain    Home Living Family/patient expects to be discharged to:: Private residence Living Arrangements: Spouse/significant other Available Help at Discharge: Family;Available 24 hours/day Type of Home: House Home Access: Stairs to enter   [employer] of Steps: 1 + 2 no rails Home Layout: One level Home Equipment: Walker - 2 wheels;Cane - single point;Bedside commode      Prior Function Level of Independence: Independent with assistive device(s)         Comments: Uses SPC vs RW for ambulation. Reports ~7 falls in last 6 months, "my wife usually catches me before I fall."     Hand  Dominance        Extremity/Trunk Assessment   Upper Extremity Assessment: Defer to OT evaluation           Lower Extremity Assessment: Generalized weakness         Communication   Communication: No difficulties  Cognition Arousal/Alertness: Awake/alert Behavior During Therapy: WFL for tasks assessed/performed Overall  Cognitive Status: Within Functional Limits for tasks assessed                      General Comments      Exercises        Assessment/Plan    PT Assessment Patient needs continued PT services  PT Diagnosis Generalized weakness;Difficulty walking   PT Problem List Decreased strength;Cardiopulmonary status limiting activity;Decreased activity tolerance;Decreased balance;Decreased mobility;Decreased safety awareness  PT Treatment Interventions Balance training;Gait training;Stair training;Functional mobility training;Therapeutic activities;Therapeutic exercise;Patient/family education   PT Goals (Current goals can be found in the Care Plan section) Acute Rehab PT Goals Patient Stated Goal: to be able to do what I need to do at home. PT Goal Formulation: With patient Time For Goal Achievement: 10/25/14 Potential to Achieve Goals: Fair    Frequency Min 3X/week   Barriers to discharge        Co-evaluation               End of Session Equipment Utilized During Treatment: Gait belt;Oxygen Activity Tolerance: Patient limited by fatigue Patient left: in bed;with call bell/phone within reach;with bed alarm set;with nursing/sitter in room Nurse Communication: Mobility status         Time: 4098-1191 PT Time Calculation (min) (ACUTE ONLY): 21 min   Charges:   PT Evaluation $Initial PT Evaluation Tier I: 1 Procedure     PT G CodesCandy Sledge A 2014/10/12, 4:46 PM Candy Sledge, PT, DPT [phone]

## 2014-10-11 NOTE — Progress Notes (Signed)
OT Cancellation Note  Patient Details Name: Cristian Collins MRN: [id number] DOB: 30-Aug-1929   Cancelled Treatment:    Reason Eval/Treat Not Completed: Patient at procedure or test/ unavailable  Benito Mccreedy OTR/L [phone]  10/11/2014, 2:18 PM

## 2014-10-11 NOTE — Progress Notes (Signed)
[facility] PROGRESS NOTE  JACQUAN SAVAS ZOX:[id number] DOB: 02-07-1930 DOA: 10/07/2014 PCP: No primary care provider on file.  Brief Summary  The patient is an 81 year old male with history of COPD, coronary artery disease, hypertension, hyperlipidemia, peripheral vascular disease, chronic diastolic heart failure, peptic ulcer disease, anemia, who presented with shortness of breath. He is on home O2 for his COPD and heart failure. He describes coughing up additional phlegm but denied fevers and chills. He was admitted for COPD exacerbation and started on Cymetra, DuoNeb's and antibiotics.  His course has been complicated by progressive anemia and acute kidney injury.  Assessment/Plan  COPD exacerbation, still very SOB and had acute episode of dyspnea this AM -  Continue Solu-Medrol40 mg IV every 6 hours  -  Continue DuoNeb nebulizers every 6 hours -  Continue tessalon and Mucinex DM twice a day -  Continue prn morphine for cough -  Continue oral levofloxacin, day 5 of 7 -  Continue LABA/ICS to nebulized versions -  Not on ACEI/ARB -  Increase PPI to BID -  CXR and stat ABG -  May need bipap prn >> change to stepdown status -  Pulm consult  Musculoskeletal pain -  Continue low dose morphine prn  Iron deficiency anemia, had some dark streaks in stools last week. Hemoglobin stable around 8.5mg /dl -  Continue increased iron supplementation -  Occult negative -  Given severity of breathing issues, would defer GI evaluation -  B12 wnl -  TSH wnl -  Folate wnl -  Okay to resume aspirin  Acute kidney injury due to dehydration, creatinine trended down to 1.2 with IVF, resolved.  Hx of a-fib s/p ablation and pacemaker placement -  Tele:  NSR, tele now off  Hypertension Continue amlodipine 10 mg daily  CAD -  Continue statin, metoprolol, isosorbide, ASA  Hyperlipidemia Continue Zocor  Severe protein calorie malnutrition -  Nutrition consult -  Supplements -   Liberalize diet  Leukocytosis, likely steroid-induced  Diet:  regular Access:  PIV IVF:  off Proph:  SCDs  Code Status: Full Family Communication: patient alone Disposition Plan: pending improvement in breathing.  Pulm consult today    Consultants: None  Procedures:  chest x-ray   Antibiotics:  Levofloxacin from 2/26   HPI/Subjective:  Still very SOB and coughing up copious secretions.  Episode of severe dyspnea this AM about an hour after nebulizer treatment - thought he may require bipap, but he settled down after a few minutes.    Objective: Filed Vitals:   10/10/14 2057 10/11/14 0105 10/11/14 0504 10/11/14 0909  BP: 172/58  159/62   Pulse: 95  84   Temp: 97.8 F (36.6 C)  98 F (36.7 C)   TempSrc: Oral  Oral   Resp: 20  20   Height:      Weight:   45.949 kg (101 lb 4.8 oz)   SpO2: 99% 99% 98% 99%    Intake/Output Summary (Last 24 hours) at 10/11/14 1001 Last data filed at 10/11/14 0859  Gross per 24 hour  Intake    960 ml  Output    400 ml  Net    560 ml   Filed Weights   10/09/14 0609 10/10/14 0442 10/11/14 0504  Weight: 45.859 kg (101 lb 1.6 oz) 45.36 kg (100 lb) 45.949 kg (101 lb 4.8 oz)    Exam:   General:  Cachectic male, moderate respiratory distress this AM >> initially unable to speak more than a few  words without needing to purse lip breath.  SCM, intercostal retractions.    HEENT:  NCAT, MMM  Cardiovascular:  RRR, nl S1, S2 , 1/6 systolic murmur RSB/LSB, 2+ pulses, warm extremities  Respiratory:  Diminished bilaterally, prolonged I:E, forced exhalations and working to inhale, course rhonchi  Abdomen:   NABS, soft, NT/ND  MSK:   Normal tone and bulk, no LEE  Neuro:  Grossly intact  Data Reviewed: Basic Metabolic Panel:  Recent Labs Lab 10/07/14 1538 10/08/14 0415 10/09/14 0700  NA 137 136 136  K 3.4* 4.5 4.8  CL 102 97 100  CO2 28 29 27   GLUCOSE 126* 160* 159*  BUN 15 23 24*  CREATININE 1.16 1.43* 1.21  CALCIUM 8.6  8.9 9.0   Liver Function Tests:  Recent Labs Lab 10/07/14 1538 10/08/14 0415  AST 28 23  ALT 15 14  ALKPHOS 97 83  BILITOT 0.5 0.2*  PROT 6.5 6.0  ALBUMIN 3.6 3.2*   No results for input(s): LIPASE, AMYLASE in the last 168 hours. No results for input(s): AMMONIA in the last 168 hours. CBC:  Recent Labs Lab 10/07/14 1538 10/08/14 0415 10/08/14 1148 10/09/14 0700  WBC 7.7 5.0  --  11.6*  HGB 8.9* 7.8*  --  8.5*  HCT 28.5* 24.8* 26.0* 25.5*  MCV 83.3 84.4  --  81.2  PLT 213 199  --  173   Cardiac Enzymes: No results for input(s): CKTOTAL, CKMB, CKMBINDEX, TROPONINI in the last 168 hours. BNP (last 3 results)  Recent Labs  10/07/14 1538  BNP 72.0    ProBNP (last 3 results)  Recent Labs  11/27/13 1405  PROBNP 287.0    CBG: No results for input(s): GLUCAP in the last 168 hours.  Recent Results (from the past 240 hour(s))  Culture, respiratory (NON-Expectorated)     Status: None   Collection Time: 10/07/14  5:09 PM  Result Value Ref Range Status   Specimen Description SPUTUM  Final   Special Requests NONE  Final   Gram Stain   Final    FEW WBC PRESENT,BOTH PMN AND MONONUCLEAR RARE SQUAMOUS EPITHELIAL CELLS PRESENT MODERATE GRAM POSITIVE COCCI IN PAIRS IN CLUSTERS RARE GRAM POSITIVE RODS Performed at [facility]    Culture   Final    NORMAL OROPHARYNGEAL FLORA Performed at [facility]    Report Status 10/09/2014 FINAL  Final  MRSA PCR Screening     Status: None   Collection Time: 10/07/14 11:10 PM  Result Value Ref Range Status   MRSA by PCR NEGATIVE NEGATIVE Final    Comment:        The GeneXpert MRSA Assay (FDA approved for NASAL specimens only), is one component of a comprehensive MRSA colonization surveillance program. It is not intended to diagnose MRSA infection nor to guide or monitor treatment for MRSA infections.      Studies: Dg Chest Port 1 View  10/09/2014   CLINICAL DATA:  Shortness of breath  EXAM:  PORTABLE CHEST - 1 VIEW  COMPARISON:  11/05/2014  FINDINGS: Chronic interstitial markings/emphysematous changes with hyperinflation. No pleural effusion or pneumothorax.  The heart is normal in size.  IMPRESSION: No evidence of acute cardiopulmonary disease.   Electronically Signed   By: Julian Hy M.D.   On: 10/09/2014 12:48    Scheduled Meds: . amLODipine  10 mg Oral Daily  . antiseptic oral rinse  7 mL Mouth Rinse BID  . arformoterol  15 mcg Nebulization BID  . aspirin  EC  81 mg Oral Daily  . benzonatate  200 mg Oral TID  . budesonide (PULMICORT) nebulizer solution  0.5 mg Nebulization BID  . dextromethorphan-guaiFENesin  1 tablet Oral BID  . feeding supplement (ENSURE COMPLETE)  237 mL Oral TID BM  . ferrous fumarate  1 tablet Oral BID  . ipratropium-albuterol  3 mL Nebulization Q6H  . isosorbide mononitrate  60 mg Oral Daily  . levofloxacin  250 mg Oral Daily  . methylPREDNISolone (SOLU-MEDROL) injection  40 mg Intravenous Q6H  . metoprolol succinate  25 mg Oral Daily  . pantoprazole  40 mg Oral BID AC  . simvastatin  20 mg Oral Daily  . sodium chloride  3 mL Intravenous Q12H   Continuous Infusions:    Active Problems:   COPD exacerbation   Protein-calorie malnutrition, severe   Essential hypertension   CAD (coronary artery disease)   AKI (acute kidney injury)   Normocytic anemia   Anemia, iron deficiency    Time spent: 30 min    Karanveer Ramakrishnan, [place] [facility] Pager [phone]. If 7PM-7AM, please contact night-coverage at [email or web address], password [facility] [place] 10/11/2014, 10:01 AM  LOS: 4 days

## 2014-10-11 NOTE — Progress Notes (Signed)
Short, MD notified of new accessory muscle use and SOB at rest. Pt's symptoms exacerbated by cough and seemed to resolve slightly with rest. Pt put back on tele monitor. RRT called to assess pt. PRN oral morphine given, PRN BiPAP ordered, STAT CXR and ABG also ordered. VSS at this time, pt resting in bed. Will continue to monitor.   Pres[place]illa Sours, Therapist, sports

## 2014-10-11 NOTE — Progress Notes (Signed)
NUTRITION FOLLOW UP  DOCUMENTATION CODES Per approved criteria  -Severe malnutrition in the context of chronic illness       Pt meets criteria for severe MALNUTRITION in the context of chronic illness as evidenced by severe muscle wasting and intake < 75% of estimated energy requirement for > 1 month.  Intervention:    Increase Ensure Complete to TID between meals.  Discussed ways to increase calorie and protein intake at home.  Nutrition Dx:   Increased energy/protein needs related to severe PCM, maintenance of Lean Body Mass in a Chronic disease state as evidenced by severe muscle wasting, ongoing.  Goal:   Intake to meet >90% of estimated nutrition needs. Met.  Monitor:   PO intake, labs, weight trend.  Assessment:   Patient admitted on 2/26 with SOB related to COPD exacerbation; chronic home O2 with history of COPD and CHF.  Patient reports that he has been eating well. He hasn't had lunch yet because he had a procedure this AM. Per RN, he had an episode of respiratory distress this AM. Nurse tech to heat up his lunch food so he can eat it. He likes the Ensure; drinks three per day at home.   Height: Ht Readings from Last 1 Encounters:  10/07/14 _0  (1.676 m)    Weight Status:   Wt Readings from Last 1 Encounters:  10/11/14 101 lb 4.8 oz (45.949 kg)   10/08/14 100 lb 15.5 oz (45.8 kg)        Re-estimated needs:  Kcal: 1600-1850 Protein: 80-90 gm Fluid: 1.6-1.8 L  Skin: WDL  Diet Order: Diet regular; Ensure Complete PO BID   Intake/Output Summary (Last 24 hours) at 10/11/14 1453 Last data filed at 10/11/14 1200  Gross per 24 hour  Intake    840 ml  Output    750 ml  Net     90 ml    Last BM: 2/28   Labs:   Recent Labs Lab 10/08/14 0415 10/09/14 0700 10/11/14 1201  NA 136 136 134*  K 4.5 4.8 4.5  CL 97 100 97  CO2 _1 BUN 23 24* 21  CREATININE 1.43* 1.21 1.07  CALCIUM 8.9 9.0 8.8  GLUCOSE 160* 159* 169*    CBG (last 3)   No results for input(s): GLUCAP in the last 72 hours.  Scheduled Meds: . amLODipine  10 mg Oral Daily  . antiseptic oral rinse  7 mL Mouth Rinse BID  . arformoterol  15 mcg Nebulization BID  . aspirin EC  81 mg Oral Daily  . benzonatate  200 mg Oral TID  . budesonide (PULMICORT) nebulizer solution  0.5 mg Nebulization BID  . feeding supplement (ENSURE COMPLETE)  237 mL Oral TID BM  . ferrous fumarate  1 tablet Oral BID  . furosemide  40 mg Intravenous Q6H  . ipratropium-albuterol  3 mL Nebulization Q6H  . isosorbide mononitrate  60 mg Oral Daily  . levofloxacin  250 mg Oral Daily  . methylPREDNISolone (SOLU-MEDROL) injection  40 mg Intravenous Q6H  . metoprolol succinate  25 mg Oral Daily  . pantoprazole  40 mg Oral BID AC  . simvastatin  20 mg Oral Daily  . sodium chloride  3 mL Intravenous Q12H    Continuous Infusions:     Molli Barrows, RD, LDN, [place] Pager [phone] After Hours Pager [phone]

## 2014-10-11 NOTE — Progress Notes (Signed)
Ok to give Tussionex in light of Morphine 10mg  given at 1000 per [place] Bells, MD  Prescilla Sours, RN

## 2014-10-11 NOTE — Consult Note (Signed)
PULMONARY / CRITICAL CARE MEDICINE   Name: Cristian Collins MRN: [id number] DOB: Sep 22, 1929    ADMISSION DATE:  10/07/2014 CONSULTATION DATE:  10/11/2014  REFERRING MD :  Sheran Fava  CHIEF COMPLAINT:  Shortness of breath  INITIAL PRESENTATION: 81 year old male with COPD followed by Dr. Melvyn Novas in the office was admitted on 10/07/2014 with shortness of breath, cough productive of copious amounts of clear sputum. Treated for a COPD exacerbation. Pulmonary critical care medicine was consult on 10/11/2014 for persistent dyspnea.  STUDIES:    SIGNIFICANT EVENTS:    HISTORY OF PRESENT ILLNESS:  This is a pleasant 81 year old male with a past medical history significant for Gold stage 3 COPD who was admitted on 10/07/2014. He noted increasing shortness of breath, chest pain with cough, frequent cough with copious amounts of mucus production. He states that the mucus is clear. [facility] and they have been treating him with bronchodilators, his home dose of Spiriva, and Solu-Medrol. Pulmonary and critical care medicine was consulted on March 1 because of persistent dyspnea. He says that his biggest problem is chest congestion and he feels that he can't get any of the mucus out. However, his nurse notes that he produces copious amounts of mucus throughout the course of the day. He denies fever or chills. He states that he does feel like he has been swelling a bit more recently. He notes that his cardiologist adjusted his dose of Lasix prior to his hospital visit. In hospital he has been receiving frequent bronchodilators. On 10/11/2014 he had a severe coughing spell followed by significant dyspnea with witnessed accessory muscle use. Pulmonary and critical care medicine was consulted for further advice. He states that he has been taking his medications at home on a regular basis including his Spiriva and Symbicort. He has not been smoking. He uses his oxygen at home.  He  notes a sharp stabbing chest pain which occurs primarily with cough.  He says that this is distinctly different than his usual "heart pain".   PFT's from 05/21/12: FEV 1 32%, ratio 41  CT 11/28/13 showed progressive scar in RUL with spiculated nodularity, Dr. Melvyn Novas had recommended that he have a repeat CT 11/29/14. His notes state that pt is clearly not a surgical candidate; however, could consider RT if this scar appears to be malignant  PAST MEDICAL HISTORY :   has a past medical history of COPD (chronic obstructive pulmonary disease); Hypertension; Hyperlipidemia; History of peptic ulcer disease; Anemia; Gastritis; Urinary retention; History of hypokalemia; Hyponatremia; Prostate cancer; Coronary artery disease; Chest pain; Pneumonia; Chronic bronchitis; Exertional shortness of breath; Migraines (1960's); Right knee DJD; Anginal pain; Peripheral vascular disease; CHF (congestive heart failure); Asthma; GERD (gastroesophageal reflux disease); H/O hiatal hernia; Blood dyscrasia; and AKI (acute kidney injury) (10/08/2014).  has past surgical history that includes Partial gastrectomy (1950); Prostatectomy (~ 2006); Replacement total knee (Right, 11/22/2010); Tonsillectomy (~ 1949); Appendectomy (~ 1955); Cardiac catheterization (07/2012); Coronary angioplasty with stent (03/09/2013); Joint replacement; left heart catheterization with coronary angiogram (N/A, 07/28/2012); left heart catheterization with coronary angiogram (N/A, 03/09/2013); percutaneous coronary stent intervention (pci-s) (03/09/2013); and left heart catheterization with coronary angiogram (N/A, 07/21/2013). Prior to Admission medications   Medication Sig Start Date End Date Taking? Authorizing Provider  albuterol (PROAIR HFA) 108 (90 BASE) MCG/ACT inhaler Inhale 2 puffs into the lungs 4 (four) times daily. For shortness of breath   Yes Historical Provider, MD  albuterol (PROVENTIL) (2.5 MG/3ML) 0.083% nebulizer solution Take 3  mLs (2.5 mg  total) by nebulization every 4 (four) hours as needed for shortness of breath. For shortness of breath 12/06/13  Yes Thurnell Lose, MD  amLODipine (NORVASC) 10 MG tablet Take 10 mg by mouth daily.    Yes Historical Provider, MD  aspirin EC 81 MG tablet Take 81 mg by mouth daily.   Yes Historical Provider, MD  budesonide-formoterol (SYMBICORT) 160-4.5 MCG/ACT inhaler Inhale 2 puffs into the lungs 2 (two) times daily. 12/06/13 07/28/15 Yes Thurnell Lose, MD  Cyanocobalamin (VITAMIN B-12 IJ) Inject 1,000 mcg as directed every 3 (three) months.   Yes Historical Provider, MD  ergocalciferol (VITAMIN D2) 50000 UNITS capsule Take 50,000 Units by mouth 2 (two) times a week. On Mon & Thrus   Yes Historical Provider, MD  feeding supplement, ENSURE COMPLETE, (ENSURE COMPLETE) LIQD Take 237 mLs by mouth 3 (three) times daily between meals. 12/06/13  Yes Thurnell Lose, MD  ferrous fumarate (HEMOCYTE - 106 MG FE) 325 (106 FE) MG TABS Take 1 tablet by mouth daily.   Yes Historical Provider, MD  isosorbide mononitrate (IMDUR) 60 MG 24 hr tablet Take 60 mg by mouth daily.   Yes Historical Provider, MD  metoprolol succinate (TOPROL-XL) 25 MG 24 hr tablet Take 25 mg by mouth daily. 07/28/12  Yes Laverda Page, MD  neomycin-polymyxin b-dexamethasone (MAXITROL) 3.5-10000-0.1 SUSP Place 1 drop into both eyes 3 (three) times daily as needed (irratation).   Yes Historical Provider, MD  nitroGLYCERIN (NITROSTAT) 0.4 MG SL tablet Place 0.4 mg under the tongue every 5 (five) minutes as needed for chest pain. For chest pain   Yes Historical Provider, MD  pantoprazole (PROTONIX) 40 MG tablet Take 40 mg by mouth daily.   Yes Historical Provider, MD  simvastatin (ZOCOR) 20 MG tablet Take 20 mg by mouth daily.    Yes Historical Provider, MD  tiotropium (SPIRIVA) 18 MCG inhalation capsule Place 18 mcg into inhaler and inhale daily.    Yes Historical Provider, MD  torsemide (DEMADEX) 20 MG tablet 1 tablet every other day  08/01/14  Yes Historical Provider, MD   No Known Allergies  FAMILY HISTORY:  has no family status information on file.  SOCIAL HISTORY:  reports that he quit smoking about 46 years ago. His smoking use included Cigarettes. He has a 20 pack-year smoking history. He has never used smokeless tobacco. He reports that he does not use illicit drugs.  REVIEW OF SYSTEMS:   Gen: Denies fever, chills, weight change, fatigue, night sweats HEENT: Denies blurred vision, double vision, hearing loss, tinnitus, sinus congestion, rhinorrhea, sore throat, neck stiffness, dysphagia PULM: per HPI CV: Denies chest pain, edema, orthopnea, paroxysmal nocturnal dyspnea, palpitations GI: Denies abdominal pain, nausea, vomiting, diarrhea, hematochezia, melena, constipation, change in bowel habits GU: Denies dysuria, hematuria, polyuria, oliguria, urethral discharge Endocrine: Denies hot or cold intolerance, polyuria, polyphagia or appetite change Derm: Denies rash, dry skin, scaling or peeling skin change Heme: Denies easy bruising, bleeding, bleeding gums Neuro: Denies headache, numbness, weakness, slurred speech, loss of memory or consciousness   SUBJECTIVE:   VITAL SIGNS: Temp:  [97.8 F (36.6 C)-98 F (36.7 C)] 98 F (36.7 C) (03/01 0504) Pulse Rate:  [66-95] 78 (03/01 1011) Resp:  [17-25] 25 (03/01 1011) BP: (130-172)/(50-64) 165/64 mmHg (03/01 1011) SpO2:  [98 %-100 %] 100 % (03/01 1011) Weight:  [45.949 kg (101 lb 4.8 oz)] 45.949 kg (101 lb 4.8 oz) (03/01 0504) HEMODYNAMICS:   VENTILATOR SETTINGS:   INTAKE /  OUTPUT:  Intake/Output Summary (Last 24 hours) at 10/11/14 1044 Last data filed at 10/11/14 0859  Gross per 24 hour  Intake    960 ml  Output    400 ml  Net    560 ml    PHYSICAL EXAMINATION: General:  Comfortable in bed Neuro:  Awake and alert, maew HEENT:  NCAT, EOMi, OP clear Cardiovascular:  RRR, no mgr, elevated JVP noted Lungs:  Few crackles in right base, scattered  wheeze, good air movement Abdomen:  BS+, soft, nontender Musculoskeletal:  Cachexia, muscular atrophy noted Skin:  No breakdown, no rash  LABS:  CBC  Recent Labs Lab 10/07/14 1538 10/08/14 0415 10/08/14 1148 10/09/14 0700  WBC 7.7 5.0  --  11.6*  HGB 8.9* 7.8*  --  8.5*  HCT 28.5* 24.8* 26.0* 25.5*  PLT 213 199  --  173   Coag's No results for input(s): APTT, INR in the last 168 hours. BMET  Recent Labs Lab 10/07/14 1538 10/08/14 0415 10/09/14 0700  NA 137 136 136  K 3.4* 4.5 4.8  CL 102 97 100  CO2 28 29 27   BUN 15 23 24*  CREATININE 1.16 1.43* 1.21  GLUCOSE 126* 160* 159*   Electrolytes  Recent Labs Lab 10/07/14 1538 10/08/14 0415 10/09/14 0700  CALCIUM 8.6 8.9 9.0   Sepsis Markers  Recent Labs Lab 10/07/14 1607 10/07/14 1758  LATICACIDVEN 2.11* 2.20*   ABG  Recent Labs Lab 10/07/14 1537  PHART 7.368  PCO2ART 51.6*  PO2ART 113.0*   Liver Enzymes  Recent Labs Lab 10/07/14 1538 10/08/14 0415  AST 28 23  ALT 15 14  ALKPHOS 97 83  BILITOT 0.5 0.2*  ALBUMIN 3.6 3.2*   Cardiac Enzymes No results for input(s): TROPONINI, PROBNP in the last 168 hours. Glucose No results for input(s): GLUCAP in the last 168 hours.  Imaging No results found.   ASSESSMENT / PLAN:  PULMONARY A: Acute exacerbation of COPD; he has recurrent exacerbations with severe airflow obstruction so he has GOLD Grade D COPD>  Complicated by protein calorie malnutrition.  Given the severity of his disease it is not too surprising that he remains dyspneic. Acute exacerbation of COPD Agree with GOLD COPD pathway approach He had an abnormal CT chest in 2015 with mild bronchiectasis and a pulmonary nodule P:   Agree with solumedrol dose Agree with your current bronchodilator regimen Add tussionex for cough Continue guaifenesin Continue flutter valve Continue O2 as your doing May need either daily azithro or roflumilast to prevent exacerbations at  discharge Dietary consult for protein calorie malnutrition  Will order CT chest to evaluate pulmonary nodule and to see if there is other evidence of why his dyspnea persists  CARDIOVASCULAR A: Known CAD Appears volume overloaded on exam> pulmonary edema? 11/2013 Echo without evidence of systolic or diastolic dysfunction P:  Check proBNP, BMET Lasix x2 doses  [name], MD [facility] Pager: [phone] Cell: [phone] If no response, call [phone]   10/11/2014, 10:44 AM

## 2014-10-12 DIAGNOSIS — I5032 Chronic diastolic (congestive) heart failure: Secondary | ICD-10-CM

## 2014-10-12 DIAGNOSIS — D509 Iron deficiency anemia, unspecified: Secondary | ICD-10-CM

## 2014-10-12 MED ORDER — MORPHINE SULFATE (CONCENTRATE) 10 MG/0.5ML PO SOLN: 5.0000 mg | ORAL | Status: DC | PRN

## 2014-10-12 MED ORDER — PREDNISONE 20 MG PO TABS
60.0000 mg | ORAL_TABLET | Freq: Every day | ORAL | Status: DC
  Administered 2014-10-12 – 2014-10-14 (×3): 60 mg via ORAL
  Filled 2014-10-12 (×3): qty 3

## 2014-10-12 MED ORDER — IPRATROPIUM-ALBUTEROL 0.5-2.5 (3) MG/3ML IN SOLN
3.0000 mL | Freq: Two times a day (BID) | RESPIRATORY_TRACT | Status: DC
  Administered 2014-10-13 – 2014-10-14 (×2): 3 mL via RESPIRATORY_TRACT
  Filled 2014-10-12 (×2): qty 3

## 2014-10-12 MED ORDER — FUROSEMIDE 40 MG PO TABS
40.0000 mg | ORAL_TABLET | Freq: Every day | ORAL | Status: DC
  Administered 2014-10-12: 40 mg via ORAL
  Filled 2014-10-12: qty 1

## 2014-10-12 NOTE — Evaluation (Signed)
Occupational Therapy Evaluation Patient Details Name: Cristian Collins MRN: [id number] DOB: 02-02-1930 Today's Date: 10/12/2014    History of Present Illness Patient is a 81 y/o male who presents with increasing SOB. Admited with COPD exacerbation and A-flutter. Pt on chronic 02 with hx of CHF, COPD, CAD, HTN, HLD, PVD, PUD and anemia.    Clinical Impression   PTA pt lived at home and reports independence with ADLs with use of SPC or RW, however he reports multiple falls at home. Pt is limited by generalized weakness and decreased cardiopulmonary status and requires min guard for functional mobility and safety with ADLs. Pt would benefit from acute OT to increase strength and independence with ADLs.     Follow Up Recommendations  Home health OT;Supervision/Assistance - 24 hour    Equipment Recommendations  None recommended by OT    Recommendations for Other Services       Precautions / Restrictions Precautions Precautions: Fall Precaution Comments: COPD GOLD protocol Restrictions Weight Bearing Restrictions: No      Mobility Bed Mobility Overal bed mobility: Modified Independent             General bed mobility comments: Use of rails for support. No physical assist needed.  Transfers Overall transfer level: Needs assistance Equipment used: Straight cane Transfers: Sit to/from Stand Sit to Stand: Min guard         General transfer comment: Min guard for safety. Pt initially using bed rail to steady self in standing, however improved with time. Ambulated to sink and stood for ~5 minutes. Pt reports he switches which hand he holds his cane in.     Balance Overall balance assessment: Needs assistance Sitting-balance support: No upper extremity supported;Feet supported Sitting balance-Leahy Scale: Good     Standing balance support: Single extremity supported;During functional activity Standing balance-Leahy Scale: Poor Standing balance comment: Pt stood at sink  with single UE support and requires UE support during functional mobility.                             ADL Overall ADL's : Needs assistance/impaired Eating/Feeding: Independent;Sitting   Grooming: Wash/dry hands;Wash/dry face;Oral care;Min guard;Standing   Upper Body Bathing: Set up;Sitting   Lower Body Bathing: Min guard;Sit to/from stand   Upper Body Dressing : Set up;Sitting   Lower Body Dressing: Min guard;Sit to/from stand   Toilet Transfer: Min guard;Ambulation (cane)   Toileting- Clothing Manipulation and Hygiene: Min guard;Sit to/from stand       Functional mobility during ADLs: Min guard;Cane General ADL Comments: Pt ambulated to sink for grooming tasks. Pt stood for ~5 minutes without wheezing noted. Pt with coughing fits throughout session. HR 116 in standing; SPO2 95% in standing on 2L .      Vision Additional Comments: Pt wears glasses and reports no change from baseline.           Pertinent Vitals/Pain Pain Assessment: No/denies pain     Hand Dominance Right   Extremity/Trunk Assessment Upper Extremity Assessment Upper Extremity Assessment: Generalized weakness   Lower Extremity Assessment Lower Extremity Assessment: Generalized weakness   Cervical / Trunk Assessment Cervical / Trunk Assessment: Normal   Communication Communication Communication: No difficulties   Cognition Arousal/Alertness: Awake/alert Behavior During Therapy: WFL for tasks assessed/performed Overall Cognitive Status: Within Functional Limits for tasks assessed  Home Living Family/patient expects to be discharged to:: Private residence Living Arrangements: Spouse/significant other Available Help at Discharge: Family;Available 24 hours/day Type of Home: House Home Access: Stairs to enter [employer] of Steps: 1 + 2 no rails   Home Layout: One level     Bathroom Shower/Tub: Tub/shower unit Shower/tub  characteristics: Architectural technologist: Standard     Home Equipment: Environmental consultant - 2 wheels;Cane - single point;Bedside commode;Shower seat   Additional Comments: pt reports he mostly sponge bathes      Prior Functioning/Environment Level of Independence: Independent with assistive device(s)        Comments: Uses SPC vs RW for ambulation. Reports ~7 falls in last 6 months, "my wife usually catches me before I fall." Wife performs IADLs and homecare tasks    OT Diagnosis: Generalized weakness   OT Problem List: Decreased strength;Decreased activity tolerance;Impaired balance (sitting and/or standing);Cardiopulmonary status limiting activity   OT Treatment/Interventions: Self-care/ADL training;Therapeutic exercise;Energy conservation;DME and/or AE instruction;Therapeutic activities;Patient/family education;Balance training    OT Goals(Current goals can be found in the care plan section) Acute Rehab OT Goals Patient Stated Goal: to be able to do what I need to do at home. OT Goal Formulation: With patient Time For Goal Achievement: 10/26/14 Potential to Achieve Goals: Good  OT Frequency: Min 1X/week    End of Session Equipment Utilized During Treatment: Gait belt;Other (comment);Oxygen (cane)  Activity Tolerance: Patient tolerated treatment well Patient left: in bed;with call bell/phone within reach;with bed alarm set   Time: 7858-8502 OT Time Calculation (min): 23 min Charges:  OT General Charges $OT Visit: 1 Procedure OT Evaluation $Initial OT Evaluation Tier I: 1 Procedure OT Treatments $Self Care/Home Management : 8-22 mins G-Codes:    Juluis Rainier 31-Oct-2014, 9:11 AM  Cyndie Chime, OTR/L Occupational Therapist [phone] (pager)

## 2014-10-12 NOTE — Consult Note (Signed)
PULMONARY / CRITICAL CARE MEDICINE   Name: Cristian Collins MRN: [id number] DOB: 05/21/1930    ADMISSION DATE:  10/07/2014 CONSULTATION DATE:  10/11/2014  REFERRING MD :  Cristian Collins  CHIEF COMPLAINT:  Shortness of breath  INITIAL PRESENTATION: 81 year old male with COPD followed by Cristian Collins in the office was admitted on 10/07/2014 with shortness of breath, cough productive of copious amounts of clear sputum. Treated for a COPD exacerbation. Pulmonary critical care medicine was consult on 10/11/2014 for persistent dyspnea.  STUDIES:  3/01  CT Chest >> stable chronic lung disease with emphysema, bronchiectasis / scarring, no acute process or suspicious pulmonary nodule, diffuse atherosclerosis  SIGNIFICANT EVENTS: 2/26  Admit with AECOPD 3/01  PCCM consult for persistent dyspnea    SUBJECTIVE: Pt reports slow recovery but feeling better.  Continues to have cough and dyspnea on exertion  VITAL SIGNS: Temp:  [97.4 F (36.3 C)-97.9 F (36.6 C)] 97.8 F (36.6 C) (03/02 0500) Pulse Rate:  [69-75] 75 (03/02 0500) Resp:  [18-20] 18 (03/02 0500) BP: (142-150)/(62-75) 145/70 mmHg (03/02 0500) SpO2:  [97 %-100 %] 100 % (03/02 0500) Weight:  [101 lb 3.1 oz (45.9 kg)] 101 lb 3.1 oz (45.9 kg) (03/02 0500)  INTAKE / OUTPUT:  Intake/Output Summary (Last 24 hours) at 10/12/14 1157 Last data filed at 10/12/14 0653  Gross per 24 hour  Intake    240 ml  Output   3225 ml  Net  -2985 ml    PHYSICAL EXAMINATION: General:  Comfortable in bed Neuro:  Awake and alert, maew HEENT:  NCAT, EOMi, OP clear Cardiovascular:  RRR, no mgr, elevated JVP noted Lungs:  good air movement, distant, soft wheeze  Abdomen:  BS+, soft, nontender Musculoskeletal:  Cachexia, muscular atrophy noted Skin:  No breakdown, no rash  LABS:  CBC  Recent Labs Lab 10/07/14 1538 10/08/14 0415 10/08/14 1148 10/09/14 0700  WBC 7.7 5.0  --  11.6*  HGB 8.9* 7.8*  --  8.5*  HCT 28.5* 24.8* 26.0* 25.5*  PLT 213 199   --  173   BMET  Recent Labs Lab 10/08/14 0415 10/09/14 0700 10/11/14 1201  NA 136 136 134*  K 4.5 4.8 4.5  CL 97 100 97  CO2 29 27 29   BUN 23 24* 21  CREATININE 1.43* 1.21 1.07  GLUCOSE 160* 159* 169*   ABG  Recent Labs Lab 10/07/14 1537 10/11/14 1000  PHART 7.368 7.394  PCO2ART 51.6* 43.9  PO2ART 113.0* 130.0*    Imaging Ct Chest Wo Contrast  10/11/2014   CLINICAL DATA:  Ex-smoker with shortness of breath and acute respiratory failure. History of coronary artery disease and prostatectomy. Initial encounter.  EXAM: CT CHEST WITHOUT CONTRAST  TECHNIQUE: Multidetector CT imaging of the chest was performed following the standard protocol without IV contrast.  COMPARISON:  Portable chest 10/11/2014. Chest CTs 11/28/2013 and 09/20/2009.  FINDINGS: Mediastinum/Nodes: No evidence of mediastinal, hilar or axillary lymphadenopathy. Hilar assessment is limited by the lack of intravenous contrast, although the hilar contours appear grossly stable. The thyroid gland, trachea and esophagus demonstrate no significant findings. There are postsurgical changes at the gastroesophageal junction. The heart size is normal. There is no pericardial effusion.There is diffuse atherosclerosis of the aorta, great vessels and coronary arteries.  Lungs/Pleura: There is no pleural effusion. There are stable pleural calcifications along the right hemidiaphragm surface. Diffuse changes of emphysema and bronchiectasis are again demonstrated. No superimposed airspace disease or suspicious pulmonary nodule demonstrated. There is stable linear scarring in the  right upper lobe and scattered peribronchial nodularity.  Upper abdomen:  Unremarkable.  There is no adrenal mass.  Musculoskeletal/Chest wall: There is no chest wall mass or suspicious osseous finding. There is a stable small sclerotic lesion in the left seventh rib laterally. Paucity of subcutaneous and mediastinal fat appears progressive.  IMPRESSION: 1. Stable  chronic lung disease with emphysema, bronchiectasis and scattered scarring. 2. No evidence of acute superimposed process or suspicious pulmonary nodule. 3. Diffuse atherosclerosis.   Electronically Signed   By: Richardean Sale M.D.   On: 10/11/2014 14:43   Dg Chest Port 1 View  10/11/2014   CLINICAL DATA:  Shortness of breath and cough  EXAM: PORTABLE CHEST - 1 VIEW  COMPARISON:  10/09/2014  FINDINGS: The heart size and mediastinal contours are within normal limits. Both lungs are clear but hyperinflated. The visualized skeletal structures are unremarkable.  IMPRESSION: COPD without acute abnormality.   Electronically Signed   By: Inez Catalina M.D.   On: 10/11/2014 10:15     ASSESSMENT / PLAN:  PULMONARY A:  Acute exacerbation of COPD - he has recurrent exacerbations with severe airflow obstruction so he has GOLD Grade D COPD,   Complicated by protein calorie malnutrition.  Given the severity of his disease it is not too surprising that he remains dyspneic. Acute exacerbation of COPD Agree with GOLD COPD pathway approach Abnormal CT chest in 2015 with mild bronchiectasis and a pulmonary nodule - repeat with no acute process, emphysema and bronchiectasis, no suspicious nodules  P:   Continue solumedrol 40 mg IV Q6 Continue current bronchodilator regimen:  Brovana, pulmicort, duoneb TID Tussionex for cough Continue guaifenesin Continue flutter valve Continue O2 - baseline 2L  May need either daily azithro or roflumilast to prevent exacerbations at discharge PRN liquid morphine for dyspnea Dietary consult for protein calorie malnutrition   CARDIOVASCULAR A:  Known CAD Appears volume overloaded on exam - pulmonary edema? 11/2013 Echo without evidence of systolic or diastolic dysfunction.  BNP 125 3/1.  P:  Monitor I/O's, net neg 2827ml after lasix     [name], NP-C [facility] Pgr: [phone] or [phone]     10/12/2014, 11:57 AM   Attending:  I have  seen and examined the patient with nurse practitioner/resident and agree with the note above.   He seems slightly improved today, lung exam has wheezing but without crackles, JVD improved, he says he is breathing better.  However, he has severe COPD, still wheezing today.   CT chest reviewed> RUL nodule (scar) is stable  Impression:  Severe dyspnea which I think is multifactorial> primarily COPD exacerbation and also some degree of diastolic heart failure.   I explained to his wife that this will likely take a long time to improve with prednisone because his COPD is so severe.  Plan: Will change to prednisone today as I don't think the very high dose of solumedrol is adding much Will add daily furosemide and BMET in AM May benefit with outpatient pulmonary rehab after discharge  [name], MD [facility] Pager: [phone] Cell: [phone] If no response, call [phone]

## 2014-10-12 NOTE — Consult Note (Signed)
Met with the patient at bedside to offer [facility] services as benefit of  Insurance.  Patient verifies that his primary MD is Dr. Glendale Chard.   Patient agreeable to services and will receive post hospital discharge call and will be evaluated for monthly home visits. Consent form signed and folder with [facility] services given.   Of note, [facility] services does not replace or interfere with any services that are arranged by inpatient case management or social work. For additional questions or referrals please contact, Natividad Brood, RN BSN CCM, [facility]iaison at [phone].

## 2014-10-12 NOTE — Progress Notes (Signed)
[facility] PROGRESS NOTE  Cristian Collins GYF:[id number] DOB: [birth year] DOA: 10/07/2014 PCP: No primary care provider on file.  Brief Summary  The patient is an 81 year old male with history of COPD, coronary artery disease, hypertension, hyperlipidemia, peripheral vascular disease, chronic diastolic heart failure, peptic ulcer disease, anemia, who presented with shortness of breath. He is on home O2 for his COPD and heart failure. He describes coughing up additional phlegm but denied fevers and chills. He was admitted for COPD exacerbation and started on Cymetra, DuoNeb's and antibiotics.  His course has been complicated by progressive anemia and acute kidney injury.   HPI/Subjective:  Feels better, shortness of breath much improved since yesterday, less wheezing and sputum production.  Assessment/Plan  COPD exacerbation, still very SOB and had acute episode of dyspnea this AM -  Continue DuoNeb nebulizers every 6 hours -  Continue tessalon and Mucinex DM twice a day -  Continue prn morphine for cough -  Continue oral levofloxacin, day 6 of 7 -  Continue LABA/ICS to nebulized versions -  Not on ACEI/ARB -  Increase PPI to BID -  CXR didn't show acute events, pulmonology consulted, diuresis started. -  Patient appears to be better after diuresis today.  Musculoskeletal pain -  Continue low dose morphine prn  Iron deficiency anemia, had some dark streaks in stools last week. Hemoglobin stable around 8.5mg /dl -  Continue increased iron supplementation -  Occult negative -  Given severity of breathing issues, would defer GI evaluation -  B12 wnl -  TSH wnl -  Folate wnl -  Check CBC in a.m.  Acute kidney injury due to dehydration, creatinine trended down to 1.2 with IVF, resolved. This is resolved, patient started on diuresis yesterday.  Hx of a-fib s/p ablation and pacemaker placement -  Tele:  NSR, tele now off  Hypertension Continue amlodipine 10 mg  daily  CAD -  Continue statin, metoprolol, isosorbide, ASA  Hyperlipidemia Continue Zocor  Severe protein calorie malnutrition -  Nutrition consult -  Supplements -  Liberalize diet  Leukocytosis, likely steroid-induced  Diet:  regular Access:  PIV IVF:  off Proph:  SCDs  Code Status: Full Family Communication: patient alone Disposition Plan: pending improvement in breathing.  Pulm consult today    Consultants: None  Procedures:  chest x-ray   Antibiotics:  Levofloxacin from 2/26    Objective: Filed Vitals:   10/11/14 1457 10/11/14 2020 10/11/14 2055 10/12/14 0500  BP: 142/62  150/75 145/70  Pulse: 69 75 72 75  Temp: 97.4 F (36.3 C)  97.9 F (36.6 C) 97.8 F (36.6 C)  TempSrc: Oral  Oral Oral  Resp: 20 20 20 18   Height:      Weight:    45.9 kg (101 lb 3.1 oz)  SpO2: 100% 97% 100% 100%    Intake/Output Summary (Last 24 hours) at 10/12/14 1309 Last data filed at 10/12/14 9163  Gross per 24 hour  Intake    240 ml  Output   2825 ml  Net  -2585 ml   Filed Weights   10/10/14 0442 10/11/14 0504 10/12/14 0500  Weight: 45.36 kg (100 lb) 45.949 kg (101 lb 4.8 oz) 45.9 kg (101 lb 3.1 oz)    Exam:   General:  Cachectic male, moderate respiratory distress this AM >> initially unable to speak more than a few words without needing to purse lip breath.  SCM, intercostal retractions.    HEENT:  NCAT, MMM  Cardiovascular:  RRR, nl  S1, S2 , 1/6 systolic murmur RSB/LSB, 2+ pulses, warm extremities  Respiratory:  Diminished bilaterally, prolonged I:E, forced exhalations and working to inhale, course rhonchi  Abdomen:   NABS, soft, NT/ND  MSK:   Normal tone and bulk, no LEE  Neuro:  Grossly intact  Data Reviewed: Basic Metabolic Panel:  Recent Labs Lab 10/07/14 1538 10/08/14 0415 10/09/14 0700 10/11/14 1201  NA 137 136 136 134*  K 3.4* 4.5 4.8 4.5  CL 102 97 100 97  CO2 28 29 27 29   GLUCOSE 126* 160* 159* 169*  BUN 15 23 24* 21  CREATININE  1.16 1.43* 1.21 1.07  CALCIUM 8.6 8.9 9.0 8.8   Liver Function Tests:  Recent Labs Lab 10/07/14 1538 10/08/14 0415  AST 28 23  ALT 15 14  ALKPHOS 97 83  BILITOT 0.5 0.2*  PROT 6.5 6.0  ALBUMIN 3.6 3.2*   No results for input(s): LIPASE, AMYLASE in the last 168 hours. No results for input(s): AMMONIA in the last 168 hours. CBC:  Recent Labs Lab 10/07/14 1538 10/08/14 0415 10/08/14 1148 10/09/14 0700  WBC 7.7 5.0  --  11.6*  HGB 8.9* 7.8*  --  8.5*  HCT 28.5* 24.8* 26.0* 25.5*  MCV 83.3 84.4  --  81.2  PLT 213 199  --  173   Cardiac Enzymes: No results for input(s): CKTOTAL, CKMB, CKMBINDEX, TROPONINI in the last 168 hours. BNP (last 3 results)  Recent Labs  10/07/14 1538 10/11/14 1201  BNP 72.0 124.6*    ProBNP (last 3 results)  Recent Labs  11/27/13 1405  PROBNP 287.0    CBG: No results for input(s): GLUCAP in the last 168 hours.  Recent Results (from the past 240 hour(s))  Culture, respiratory (NON-Expectorated)     Status: None   Collection Time: 10/07/14  5:09 PM  Result Value Ref Range Status   Specimen Description SPUTUM  Final   Special Requests NONE  Final   Gram Stain   Final    FEW WBC PRESENT,BOTH PMN AND MONONUCLEAR RARE SQUAMOUS EPITHELIAL CELLS PRESENT MODERATE GRAM POSITIVE COCCI IN PAIRS IN CLUSTERS RARE GRAM POSITIVE RODS Performed at [facility]    Culture   Final    NORMAL OROPHARYNGEAL FLORA Performed at [facility]    Report Status 10/09/2014 FINAL  Final  MRSA PCR Screening     Status: None   Collection Time: 10/07/14 11:10 PM  Result Value Ref Range Status   MRSA by PCR NEGATIVE NEGATIVE Final    Comment:        The GeneXpert MRSA Assay (FDA approved for NASAL specimens only), is one component of a comprehensive MRSA colonization surveillance program. It is not intended to diagnose MRSA infection nor to guide or monitor treatment for MRSA infections.      Studies: Ct Chest Wo  Contrast  10/11/2014   CLINICAL DATA:  Ex-smoker with shortness of breath and acute respiratory failure. History of coronary artery disease and prostatectomy. Initial encounter.  EXAM: CT CHEST WITHOUT CONTRAST  TECHNIQUE: Multidetector CT imaging of the chest was performed following the standard protocol without IV contrast.  COMPARISON:  Portable chest 10/11/2014. Chest CTs 11/28/2013 and 09/20/2009.  FINDINGS: Mediastinum/Nodes: No evidence of mediastinal, hilar or axillary lymphadenopathy. Hilar assessment is limited by the lack of intravenous contrast, although the hilar contours appear grossly stable. The thyroid gland, trachea and esophagus demonstrate no significant findings. There are postsurgical changes at the gastroesophageal junction. The heart size is normal.  There is no pericardial effusion.There is diffuse atherosclerosis of the aorta, great vessels and coronary arteries.  Lungs/Pleura: There is no pleural effusion. There are stable pleural calcifications along the right hemidiaphragm surface. Diffuse changes of emphysema and bronchiectasis are again demonstrated. No superimposed airspace disease or suspicious pulmonary nodule demonstrated. There is stable linear scarring in the right upper lobe and scattered peribronchial nodularity.  Upper abdomen:  Unremarkable.  There is no adrenal mass.  Musculoskeletal/Chest wall: There is no chest wall mass or suspicious osseous finding. There is a stable small sclerotic lesion in the left seventh rib laterally. Paucity of subcutaneous and mediastinal fat appears progressive.  IMPRESSION: 1. Stable chronic lung disease with emphysema, bronchiectasis and scattered scarring. 2. No evidence of acute superimposed process or suspicious pulmonary nodule. 3. Diffuse atherosclerosis.   Electronically Signed   By: Richardean Sale M.D.   On: 10/11/2014 14:43   Dg Chest Port 1 View  10/11/2014   CLINICAL DATA:  Shortness of breath and cough  EXAM: PORTABLE CHEST - 1  VIEW  COMPARISON:  10/09/2014  FINDINGS: The heart size and mediastinal contours are within normal limits. Both lungs are clear but hyperinflated. The visualized skeletal structures are unremarkable.  IMPRESSION: COPD without acute abnormality.   Electronically Signed   By: Inez Catalina M.D.   On: 10/11/2014 10:15    Scheduled Meds: . amLODipine  10 mg Oral Daily  . antiseptic oral rinse  7 mL Mouth Rinse BID  . arformoterol  15 mcg Nebulization BID  . aspirin EC  81 mg Oral Daily  . benzonatate  200 mg Oral TID  . budesonide (PULMICORT) nebulizer solution  0.5 mg Nebulization BID  . feeding supplement (ENSURE COMPLETE)  237 mL Oral TID BM  . ferrous fumarate  1 tablet Oral BID  . ipratropium-albuterol  3 mL Nebulization TID  . isosorbide mononitrate  60 mg Oral Daily  . levofloxacin  250 mg Oral Daily  . methylPREDNISolone (SOLU-MEDROL) injection  40 mg Intravenous Q6H  . metoprolol succinate  25 mg Oral Daily  . pantoprazole  40 mg Oral BID AC  . simvastatin  20 mg Oral Daily  . sodium chloride  3 mL Intravenous Q12H   Continuous Infusions:    Active Problems:   COPD exacerbation   Protein-calorie malnutrition, severe   Essential hypertension   CAD (coronary artery disease)   AKI (acute kidney injury)   Normocytic anemia   Anemia, iron deficiency    Time spent: 89 min    [facility] Pager [phone]. If 7PM-7AM, please contact night-coverage at [email or web address], password [facility] 10/12/2014, 1:09 PM  LOS: 5 days

## 2014-10-12 NOTE — Progress Notes (Signed)
Physical Therapy Treatment Patient Details Name: Cristian Collins MRN: [id number] DOB: January 19, 1930 Today's Date: 10/12/2014    History of Present Illness Patient is a 81 y/o male who presents with increasing SOB. Admited with COPD exacerbation and A-flutter. Pt on chronic 02 with hx of CHF, COPD, CAD, HTN, HLD, PVD, PUD and anemia.     PT Comments    Pt admitted with above diagnosis. Pt currently with functional limitations due to balance and endurance deficits.  Pt progressing daily although slowly.  Continue to progress as able.    Pt will benefit from skilled PT to increase their independence and safety with mobility to allow discharge to the venue listed below.    Follow Up Recommendations  Home health PT;Supervision/Assistance - 24 hour     Equipment Recommendations  None recommended by PT    Recommendations for Other Services       Precautions / Restrictions Precautions Precautions: Fall Precaution Comments: COPD GOLD protocol Restrictions Weight Bearing Restrictions: No    Mobility  Bed Mobility Overal bed mobility: Modified Independent             General bed mobility comments: Use of rails for support. No physical assist needed.  Transfers Overall transfer level: Needs assistance Equipment used: Rolling walker (2 wheeled) Transfers: Sit to/from Stand Sit to Stand: Min guard         General transfer comment: Min guard for safety. Pt initially using bed rail to steady self in standing, however improved with time.  Ambulation/Gait Ambulation/Gait assistance: Min assist Ambulation Distance (Feet): 85 Feet Assistive device: Rolling walker (2 wheeled) Gait Pattern/deviations: Step-to pattern;Decreased stride length;Trunk flexed   Gait velocity interpretation: Below normal speed for age/gender General Gait Details: Pt felt more comfortable with RW.  Not unsteady just slow.  No LOB with RW.  SaO2 on 2L 99%.  HR 74 bpm.     Stairs             Wheelchair Mobility    Modified Rankin (Stroke Patients Only)       Balance Overall balance assessment: Needs assistance;History of Falls Sitting-balance support: No upper extremity supported;Feet unsupported Sitting balance-Leahy Scale: Good     Standing balance support: Bilateral upper extremity supported;During functional activity Standing balance-Leahy Scale: Poor Standing balance comment: Requires UE support.                      Cognition Arousal/Alertness: Awake/alert Behavior During Therapy: WFL for tasks assessed/performed Overall Cognitive Status: Within Functional Limits for tasks assessed                      Exercises General Exercises - Lower Extremity Ankle Circles/Pumps: AROM;Both;10 reps;Supine Heel Slides: AROM;Both;10 reps;Supine Hip ABduction/ADduction: AROM;Both;10 reps;Supine    General Comments        Pertinent Vitals/Pain Pain Assessment: No/denies pain  HR 74-80 bpm, Sat 99% on 2L O2    Home Living                      Prior Function            PT Goals (current goals can now be found in the care plan section) Progress towards PT goals: Progressing toward goals    Frequency  Min 3X/week    PT Plan Current plan remains appropriate    Co-evaluation             End of Session Equipment Utilized During Treatment:  Gait belt;Oxygen Activity Tolerance: Patient limited by fatigue Patient left: in bed;with call bell/phone within reach;with family/visitor present     Time: 1856-3149 PT Time Calculation (min) (ACUTE ONLY): 19 min  Charges:  $Gait Training: 8-22 mins                    G Codes:      Denice Paradise 2014-10-31, 1:26 PM Aryahi Denzler,PT Acute Rehabilitation [phone] (pager)

## 2014-10-13 DIAGNOSIS — J9601 Acute respiratory failure with hypoxia: Secondary | ICD-10-CM | POA: Diagnosis present

## 2014-10-13 DIAGNOSIS — J9611 Chronic respiratory failure with hypoxia: Secondary | ICD-10-CM

## 2014-10-13 LAB — BASIC METABOLIC PANEL
ANION GAP: 8 (ref 5–15)
BUN: 27 mg/dL — ABNORMAL HIGH (ref 6–23)
CO2: 33 mmol/L — ABNORMAL HIGH (ref 19–32)
CREATININE: 1.17 mg/dL (ref 0.50–1.35)
Calcium: 8.4 mg/dL (ref 8.4–10.5)
Chloride: 91 mmol/L — ABNORMAL LOW (ref 96–112)
GFR calc non Af Amer: 56 mL/min — ABNORMAL LOW (ref >90)
GFR, EST AFRICAN AMERICAN: 65 mL/min — AB (ref >90)
GLUCOSE: 157 mg/dL — AB (ref 70–99)
POTASSIUM: 4.2 mmol/L (ref 3.5–5.1)
SODIUM: 132 mmol/L — AB (ref 135–145)

## 2014-10-13 MED ORDER — FUROSEMIDE 10 MG/ML IJ SOLN
40.0000 mg | Freq: Once | INTRAMUSCULAR | Status: AC
  Administered 2014-10-13: 40 mg via INTRAVENOUS
  Filled 2014-10-13: qty 4

## 2014-10-13 MED ORDER — GUAIFENESIN ER 600 MG PO TB12
1200.0000 mg | ORAL_TABLET | Freq: Two times a day (BID) | ORAL | Status: DC
  Administered 2014-10-13 – 2014-10-14 (×3): 1200 mg via ORAL
  Filled 2014-10-13 (×3): qty 2

## 2014-10-13 NOTE — Progress Notes (Signed)
[facility] PROGRESS NOTE  Cristian Collins ZOX:[id number] DOB: Sep 02, 1929 DOA: 10/07/2014 PCP: No primary care provider on file.  Brief Summary  The patient is an 81 year old male with history of COPD, coronary artery disease, hypertension, hyperlipidemia, peripheral vascular disease, chronic diastolic heart failure, peptic ulcer disease, anemia, who presented with shortness of breath. He is on home O2 for his COPD and heart failure. He describes coughing up additional phlegm but denied fevers and chills. He was admitted for COPD exacerbation and started on Cymetra, DuoNeb's and antibiotics.  His course has been complicated by progressive anemia and acute kidney injury.   HPI/Subjective:  Overall feels much better, still has heavy breathing, very faint wheezing heard but has scattered rhonchi bilaterally  Assessment/Plan  COPD acute exacerbation - Presented with SOB, wheezing and sputum production. - Continue DuoNeb nebulizers every 6 hours - Continue prn morphine for cough - Continue LABA/ICS to nebulized versions - Not on ACEI/ARB - Increase PPI to BID - CXR didn't show acute events, pulmonology consulted, diuresis started. - Levofloxacin day 7 out of 7, will discontinue, unless pulmonology recommended prolonged antibiotic course. - Repeat Lasix today, check BMP in a.m. keep intake/output in a negative balance.  Musculoskeletal pain -  Continue low dose morphine prn  Iron deficiency anemia, had some dark streaks in stools last week. Hemoglobin stable around 8.5mg /dl -  Continue increased iron supplementation -  Occult negative -  Given severity of breathing issues, would defer GI evaluation -  B12 wnl -  TSH wnl -  Folate wnl -  Check CBC in a.m.  Acute kidney injury due to dehydration, creatinine trended down to 1.2 with IVF, resolved. This is resolved, patient started on diuresis yesterday.  Hx of a-fib s/p ablation and pacemaker placement -  Tele:  NSR, tele now  off  Hypertension Continue amlodipine 10 mg daily  CAD -  Continue statin, metoprolol, isosorbide, ASA  Hyperlipidemia Continue Zocor  Severe protein calorie malnutrition -  Nutrition consult -  Supplements -  Liberalize diet  Leukocytosis, likely steroid-induced   Diet:  regular Access:  PIV IVF:  off Proph:  SCDs  Code Status: Full Family Communication: patient alone Disposition Plan: pending improvement in breathing.  Pulm consult today    Consultants: None  Procedures:  chest x-ray   Antibiotics:  Levofloxacin from 2/26    Objective: Filed Vitals:   10/12/14 2000 10/12/14 2008 10/13/14 0500 10/13/14 1030  BP: 153/56  149/62 148/55  Pulse: 81 80 64 75  Temp: 97.5 F (36.4 C)  97.6 F (36.4 C)   TempSrc: Oral  Oral   Resp:  18    Height:   5\' 7"  (1.702 m)   Weight:   45.314 kg (99 lb 14.4 oz)   SpO2: 99% 98% 98%     Intake/Output Summary (Last 24 hours) at 10/13/14 1031 Last data filed at 10/13/14 0934  Gross per 24 hour  Intake    840 ml  Output   2525 ml  Net  -1685 ml   Filed Weights   10/11/14 0504 10/12/14 0500 10/13/14 0500  Weight: 45.949 kg (101 lb 4.8 oz) 45.9 kg (101 lb 3.1 oz) 45.314 kg (99 lb 14.4 oz)    Exam:   General:  Cachectic male, mild respiratory distress, nasal cannula in place  HEENT:  NCAT, MMM  Cardiovascular:  RRR, nl S1, S2 , 1/6 systolic murmur RSB/LSB, 2+ pulses, warm extremities  Respiratory:  Rhonchi bilaterally, faint wheezing.  Abdomen:  NABS, soft, NT/ND  MSK:   Normal tone and bulk, no LEE  Neuro:  Grossly intact  Data Reviewed: Basic Metabolic Panel:  Recent Labs Lab 10/07/14 1538 10/08/14 0415 10/09/14 0700 10/11/14 1201 10/13/14 0450  NA 137 136 136 134* 132*  K 3.4* 4.5 4.8 4.5 4.2  CL 102 97 100 97 91*  CO2 28 29 27 29  33*  GLUCOSE 126* 160* 159* 169* 157*  BUN 15 23 24* 21 27*  CREATININE 1.16 1.43* 1.21 1.07 1.17  CALCIUM 8.6 8.9 9.0 8.8 8.4   Liver Function  Tests:  Recent Labs Lab 10/07/14 1538 10/08/14 0415  AST 28 23  ALT 15 14  ALKPHOS 97 83  BILITOT 0.5 0.2*  PROT 6.5 6.0  ALBUMIN 3.6 3.2*   No results for input(s): LIPASE, AMYLASE in the last 168 hours. No results for input(s): AMMONIA in the last 168 hours. CBC:  Recent Labs Lab 10/07/14 1538 10/08/14 0415 10/08/14 1148 10/09/14 0700  WBC 7.7 5.0  --  11.6*  HGB 8.9* 7.8*  --  8.5*  HCT 28.5* 24.8* 26.0* 25.5*  MCV 83.3 84.4  --  81.2  PLT 213 199  --  173   Cardiac Enzymes: No results for input(s): CKTOTAL, CKMB, CKMBINDEX, TROPONINI in the last 168 hours. BNP (last 3 results)  Recent Labs  10/07/14 1538 10/11/14 1201  BNP 72.0 124.6*    ProBNP (last 3 results)  Recent Labs  11/27/13 1405  PROBNP 287.0    CBG: No results for input(s): GLUCAP in the last 168 hours.  Recent Results (from the past 240 hour(s))  Culture, respiratory (NON-Expectorated)     Status: None   Collection Time: 10/07/14  5:09 PM  Result Value Ref Range Status   Specimen Description SPUTUM  Final   Special Requests NONE  Final   Gram Stain   Final    FEW WBC PRESENT,BOTH PMN AND MONONUCLEAR RARE SQUAMOUS EPITHELIAL CELLS PRESENT MODERATE GRAM POSITIVE COCCI IN PAIRS IN CLUSTERS RARE GRAM POSITIVE RODS Performed at [facility]    Culture   Final    NORMAL OROPHARYNGEAL FLORA Performed at [facility]    Report Status 10/09/2014 FINAL  Final  MRSA PCR Screening     Status: None   Collection Time: 10/07/14 11:10 PM  Result Value Ref Range Status   MRSA by PCR NEGATIVE NEGATIVE Final    Comment:        The GeneXpert MRSA Assay (FDA approved for NASAL specimens only), is one component of a comprehensive MRSA colonization surveillance program. It is not intended to diagnose MRSA infection nor to guide or monitor treatment for MRSA infections.      Studies: Ct Chest Wo Contrast  10/11/2014   CLINICAL DATA:  Ex-smoker with shortness of breath and  acute respiratory failure. History of coronary artery disease and prostatectomy. Initial encounter.  EXAM: CT CHEST WITHOUT CONTRAST  TECHNIQUE: Multidetector CT imaging of the chest was performed following the standard protocol without IV contrast.  COMPARISON:  Portable chest 10/11/2014. Chest CTs 11/28/2013 and 09/20/2009.  FINDINGS: Mediastinum/Nodes: No evidence of mediastinal, hilar or axillary lymphadenopathy. Hilar assessment is limited by the lack of intravenous contrast, although the hilar contours appear grossly stable. The thyroid gland, trachea and esophagus demonstrate no significant findings. There are postsurgical changes at the gastroesophageal junction. The heart size is normal. There is no pericardial effusion.There is diffuse atherosclerosis of the aorta, great vessels and coronary arteries.  Lungs/Pleura: There is  no pleural effusion. There are stable pleural calcifications along the right hemidiaphragm surface. Diffuse changes of emphysema and bronchiectasis are again demonstrated. No superimposed airspace disease or suspicious pulmonary nodule demonstrated. There is stable linear scarring in the right upper lobe and scattered peribronchial nodularity.  Upper abdomen:  Unremarkable.  There is no adrenal mass.  Musculoskeletal/Chest wall: There is no chest wall mass or suspicious osseous finding. There is a stable small sclerotic lesion in the left seventh rib laterally. Paucity of subcutaneous and mediastinal fat appears progressive.  IMPRESSION: 1. Stable chronic lung disease with emphysema, bronchiectasis and scattered scarring. 2. No evidence of acute superimposed process or suspicious pulmonary nodule. 3. Diffuse atherosclerosis.   Electronically Signed   By: Richardean Sale M.D.   On: 10/11/2014 14:43    Scheduled Meds: . amLODipine  10 mg Oral Daily  . antiseptic oral rinse  7 mL Mouth Rinse BID  . arformoterol  15 mcg Nebulization BID  . aspirin EC  81 mg Oral Daily  .  benzonatate  200 mg Oral TID  . budesonide (PULMICORT) nebulizer solution  0.5 mg Nebulization BID  . feeding supplement (ENSURE COMPLETE)  237 mL Oral TID BM  . ferrous fumarate  1 tablet Oral BID  . ipratropium-albuterol  3 mL Nebulization BID  . isosorbide mononitrate  60 mg Oral Daily  . metoprolol succinate  25 mg Oral Daily  . pantoprazole  40 mg Oral BID AC  . predniSONE  60 mg Oral Q breakfast  . simvastatin  20 mg Oral Daily  . sodium chloride  3 mL Intravenous Q12H   Continuous Infusions:    Active Problems:   COPD exacerbation   Protein-calorie malnutrition, severe   Essential hypertension   CAD (coronary artery disease)   AKI (acute kidney injury)   Normocytic anemia   Anemia, iron deficiency    Time spent: 8 min    [facility] Pager [phone]. If 7PM-7AM, please contact night-coverage at [email or web address], password [facility] 10/13/2014, 10:31 AM  LOS: 6 days

## 2014-10-13 NOTE — Progress Notes (Signed)
PULMONARY / CRITICAL CARE MEDICINE   Name: Cristian Collins MRN: [id number] DOB: 12-Jul-1930    ADMISSION DATE:  10/07/2014 CONSULTATION DATE:  10/11/2014  REFERRING MD :  Sheran Fava  CHIEF COMPLAINT:  Shortness of breath  INITIAL PRESENTATION: 81 year old male with COPD followed by Dr. Melvyn Novas in the office was admitted on 10/07/2014 with shortness of breath, cough productive of copious amounts of clear sputum. Treated for a COPD exacerbation. Pulmonary critical care medicine was consult on 10/11/2014 for persistent dyspnea.  STUDIES:  3/01  CT Chest >> stable chronic lung disease with emphysema, bronchiectasis / scarring, no acute process or suspicious pulmonary nodule, diffuse atherosclerosis  SIGNIFICANT EVENTS: 2/26  Admit with [facility] 3/01  PCCM consult for persistent dyspnea   SUBJECTIVE:   Pt still with SOB, cough with thick sputum.  Feeling better but very slowly.   VITAL SIGNS: Temp:  [97.4 F (36.3 C)-97.6 F (36.4 C)] 97.6 F (36.4 C) (03/03 0500) Pulse Rate:  [64-85] 64 (03/03 0500) Resp:  [18] 18 (03/02 2008) BP: (143-153)/(56-62) 149/62 mmHg (03/03 0500) SpO2:  [97 %-99 %] 98 % (03/03 0500) Weight:  [99 lb 14.4 oz (45.314 kg)] 99 lb 14.4 oz (45.314 kg) (03/03 0500)  INTAKE / OUTPUT:  Intake/Output Summary (Last 24 hours) at 10/13/14 0948 Last data filed at 10/13/14 0934  Gross per 24 hour  Intake    840 ml  Output   2525 ml  Net  -1685 ml    PHYSICAL EXAMINATION: General:  Thin, chronically ill appearing male, NAD eating breakfast  Neuro:  Awake and alert, maew HEENT:  NCAT, EOMi, OP clear Cardiovascular:  RRR, no mgr Lungs:  resps even, mildly labored, congested cough, diffuse exp wheeze  Abdomen:  BS+, soft, nontender Musculoskeletal:  Cachexia, muscular atrophy noted Skin:  No breakdown, no rash  LABS:  CBC  Recent Labs Lab 10/07/14 1538 10/08/14 0415 10/08/14 1148 10/09/14 0700  WBC 7.7 5.0  --  11.6*  HGB 8.9* 7.8*  --  8.5*  HCT 28.5* 24.8*  26.0* 25.5*  PLT 213 199  --  173   BMET  Recent Labs Lab 10/09/14 0700 10/11/14 1201 10/13/14 0450  NA 136 134* 132*  K 4.8 4.5 4.2  CL 100 97 91*  CO2 27 29 33*  BUN 24* 21 27*  CREATININE 1.21 1.07 1.17  GLUCOSE 159* 169* 157*   ABG  Recent Labs Lab 10/07/14 1537 10/11/14 1000  PHART 7.368 7.394  PCO2ART 51.6* 43.9  PO2ART 113.0* 130.0*    Imaging No results found.   ASSESSMENT / PLAN:  PULMONARY A:  Acute exacerbation of COPD - he has recurrent exacerbations with severe airflow obstruction so he has GOLD Grade D COPD,   Complicated by protein calorie malnutrition.  Given the severity of his disease it is not too surprising that he remains dyspneic. Agree with GOLD COPD pathway approach RUL nodule (scar) - stable  Mild bronchiectasis  P:   Cont PO pred with slow taper  Continue current bronchodilator regimen:  Brovana, pulmicort, duoneb TID Resume symbicort, spiriva at d/c  Tussionex for cough Continue guaifenesin Continue flutter valve Continue O2 - baseline 2L  May need either daily azithro or roflumilast to prevent exacerbations at discharge PRN liquid morphine for dyspnea Dietary consult for protein calorie malnutrition Consider outpt pulm rehab  Should continue discussions as outpt regarding goals of care    CARDIOVASCULAR A:  Known CAD pulmonary edema? - -2.8L with gentle diuresis.  Improved dyspnea  dCHF  P:  Monitor I/O's Hold further lasix for now    Improving slowly.  Continue prednisone with slow taper, BD's.  Likely d/c home 3/4.   PCCM will f/u as outpt - arranged for 3/14 with Dr. Melvyn Novas.   Cristian Madrid, NP 10/13/2014  9:48 AM Pager: [phone] or [phone]  Attending: I have seen and examined the patient with nurse practitioner/resident and agree with the note above.   He feels a little better today Lung exam> still wheezing some, few rhonchi  Severe COPD with exacerbation> This will likely take several weeks for  recovery and given his poor baseline lung function and heart disease, so I suspect he will always have some dyspnea. Would resume Symbicort/Spiriva at dc Would taper prednisone from 60mg  down to 20mg  by the time he sees Dr. Melvyn Novas on 3/14 for hospital f/u, then we can determine how to taper off further Continue pulmonary toilette measures  PCCM to sign off, call if questions  [name], MD [facility] Pager: [phone] Cell: [phone] If no response, call [phone]

## 2014-10-14 LAB — BASIC METABOLIC PANEL
Anion gap: 7 (ref 5–15)
BUN: 28 mg/dL — ABNORMAL HIGH (ref 6–23)
CO2: 35 mmol/L — ABNORMAL HIGH (ref 19–32)
Calcium: 8.5 mg/dL (ref 8.4–10.5)
Chloride: 92 mmol/L — ABNORMAL LOW (ref 96–112)
Creatinine, Ser: 1.07 mg/dL (ref 0.50–1.35)
GFR calc Af Amer: 73 mL/min — ABNORMAL LOW (ref >90)
GFR calc non Af Amer: 63 mL/min — ABNORMAL LOW (ref >90)
Glucose, Bld: 101 mg/dL — ABNORMAL HIGH (ref 70–99)
Potassium: 3.8 mmol/L (ref 3.5–5.1)
SODIUM: 134 mmol/L — AB (ref 135–145)

## 2014-10-14 MED ORDER — PREDNISONE (PAK) 10 MG PO TABS: ORAL_TABLET | ORAL | Status: DC

## 2014-10-14 MED ORDER — GUAIFENESIN ER 600 MG PO TB12: 1200.0000 mg | ORAL_TABLET | Freq: Two times a day (BID) | ORAL | Status: DC

## 2014-10-14 NOTE — Progress Notes (Signed)
PT Cancellation Note  Patient Details Name: SAKIB NOGUEZ MRN: [id number] DOB: January 19, 1930   Cancelled Treatment:    Reason Eval/Treat Not Completed: Other (comment) (Politely declined as he is leaving today.)   Denice Paradise 10/14/2014, 2:09 PM  [facility] [phone] (pager)

## 2014-10-14 NOTE — Discharge Summary (Signed)
Physician Discharge Summary  Cristian Collins RWE:[id number] DOB: [birth year] DOA: 10/07/2014  PCP: No primary care provider on file.  Admit date: 10/07/2014 Discharge date: 10/14/2014  Time spent: 40 minutes  Recommendations for Outpatient Follow-up:  1. Follow-up with primary care physician in one week. 2. Follow-up with Dr. Melvyn Novas on 10/24/2014 at 9 AM  Discharge Diagnoses:  Active Problems:   COPD exacerbation   Protein-calorie malnutrition, severe   Essential hypertension   CAD (coronary artery disease)   AKI (acute kidney injury)   Normocytic anemia   Anemia, iron deficiency   Acute respiratory failure with hypoxia   Discharge Condition: Stable  Diet recommendation: Heart healthy  Filed Weights   10/12/14 0500 10/13/14 0500 10/14/14 0500  Weight: 45.9 kg (101 lb 3.1 oz) 45.314 kg (99 lb 14.4 oz) 44.906 kg (99 lb)    History of present illness:  81 year old male who  has a past medical history of COPD presents to the hospital with chief complaint of shortness of breath which started 1 day PTA, patient has chronic home O2 with history of COPD and CHF. He also complains of coughing up phlegm, no fever or chills. Denies any chest pain. Patient is followed by pulmonary and cardiology as outpatient. He denies nausea vomiting or diarrhea. Admits to shortness of breath on exertion.  Hospital Course:   COPD acute exacerbation - Presented with SOB, wheezing and sputum production. - Started on bronchodilators, mucolytics, antitussives and oxygen as needed. - IV antibiotics and IV steroids. - Patient developed more shortness of breath while he was in the hospital, treated with IV Lasix - Patient received a total of 7 days of IV levofloxacin. - On discharge discharged on prednisone taper and Mucinex, outpatient follow-up with Dr. Melvyn Novas in 10 days. - Pulmonology recommended prolonged prednisone taper and keep patient on 20 mg until he sees Dr. Melvyn Novas.  Musculoskeletal pain - Continue  low dose morphine prn  Iron deficiency anemia, had some dark streaks in stools last week. Hemoglobin stable around 8.5mg /dl - Continue increased iron supplementation - Occult negative - Continue iron supplementation.  Acute kidney injury due to dehydration, creatinine trended down to 1.2 with IVF, resolved. This is resolved, patient restarted on his home dose of Demadex and the time of discharge.  Hx of a-fib s/p ablation and pacemaker placement - Tele: NSR, tele now off  Hypertension Continue amlodipine 10 mg daily  CAD - Continue statin, metoprolol, isosorbide, ASA  Hyperlipidemia Continue Zocor  Severe protein calorie malnutrition - Nutrition consult - Supplements - Liberalize diet  Leukocytosis, likely steroid-induced  Chronic respiratory failure -Patient is on 2 L of oxygen at home, on discharge he only needed 2 L of oxygen.  Procedures:  None  Consultations:  Pulmonology  Discharge Exam: Filed Vitals:   10/14/14 1019  BP: 141/60  Pulse:   Temp:   Resp:    General: Alert and awake, oriented x3, not in any acute distress. HEENT: anicteric sclera, pupils reactive to light and accommodation, EOMI CVS: S1-S2 clear, no murmur rubs or gallops Chest: clear to auscultation bilaterally, no wheezing, rales or rhonchi Abdomen: soft nontender, nondistended, normal bowel sounds, no organomegaly Extremities: no cyanosis, clubbing or edema noted bilaterally Neuro: Cranial nerves II-XII intact, no focal neurological deficits  Discharge Instructions    Current Discharge Medication List    START taking these medications   Details  guaiFENesin (MUCINEX) 600 MG 12 hr tablet Take 2 tablets (1,200 mg total) by mouth 2 (two) times daily. Qty: 30  tablet, Refills: 0    predniSONE (STERAPRED UNI-PAK) 10 MG tablet Take 4 tabs orally by mouth for 2 days, then take 3 tabs orally by mouth for 2 days, then take 2 tabs orally by mouth till you see Dr. Melvyn Novas. Qty: 60  tablet, Refills: 0      CONTINUE these medications which have NOT CHANGED   Details  albuterol (PROAIR HFA) 108 (90 BASE) MCG/ACT inhaler Inhale 2 puffs into the lungs 4 (four) times daily. For shortness of breath    albuterol (PROVENTIL) (2.5 MG/3ML) 0.083% nebulizer solution Take 3 mLs (2.5 mg total) by nebulization every 4 (four) hours as needed for shortness of breath. For shortness of breath Qty: 75 mL, Refills: 12    amLODipine (NORVASC) 10 MG tablet Take 10 mg by mouth daily.     aspirin EC 81 MG tablet Take 81 mg by mouth daily.    budesonide-formoterol (SYMBICORT) 160-4.5 MCG/ACT inhaler Inhale 2 puffs into the lungs 2 (two) times daily. Qty: 1 Inhaler, Refills: 12    Cyanocobalamin (VITAMIN B-12 IJ) Inject 1,000 mcg as directed every 3 (three) months.    ergocalciferol (VITAMIN D2) 50000 UNITS capsule Take 50,000 Units by mouth 2 (two) times a week. On Mon & Thrus    feeding supplement, ENSURE COMPLETE, (ENSURE COMPLETE) LIQD Take 237 mLs by mouth 3 (three) times daily between meals. Qty: 30 Bottle, Refills: 2    ferrous fumarate (HEMOCYTE - 106 MG FE) 325 (106 FE) MG TABS Take 1 tablet by mouth daily.    isosorbide mononitrate (IMDUR) 60 MG 24 hr tablet Take 60 mg by mouth daily.    metoprolol succinate (TOPROL-XL) 25 MG 24 hr tablet Take 25 mg by mouth daily.    neomycin-polymyxin b-dexamethasone (MAXITROL) 3.5-10000-0.1 SUSP Place 1 drop into both eyes 3 (three) times daily as needed (irratation).    nitroGLYCERIN (NITROSTAT) 0.4 MG SL tablet Place 0.4 mg under the tongue every 5 (five) minutes as needed for chest pain. For chest pain    pantoprazole (PROTONIX) 40 MG tablet Take 40 mg by mouth daily.    simvastatin (ZOCOR) 20 MG tablet Take 20 mg by mouth daily.     tiotropium (SPIRIVA) 18 MCG inhalation capsule Place 18 mcg into inhaler and inhale daily.     torsemide (DEMADEX) 20 MG tablet 1 tablet every other day Refills: 6       No Known  Allergies Follow-up Information    Follow up with Cristian Gully, MD On 10/24/2014.   Specialty:  Pulmonary Disease   Why:  9AM   Contact information:   [address] [place] [address] [phone]       Follow up with Cristian Collins.   Why:  Home Health Services-Registered Nurse for COPD- disease management, Physical and Occupational Therapy   Contact information:   [address] [place] [address] [phone]        The results of significant diagnostics from this hospitalization (including imaging, microbiology, ancillary and laboratory) are listed below for reference.    Significant Diagnostic Studies: Dg Chest 2 View (if Patient Has Fever And/or Copd)  10/07/2014   CLINICAL DATA:  One week history of cough and chest pain ; congestion. Underlying COPD  EXAM: CHEST  2 VIEW  COMPARISON:  Dec 14, 2013  FINDINGS: There is underlying emphysematous change. There is no edema or consolidation. Heart size is normal. Pulmonary vascularity suggests a degree of pulmonary arterial hypertension. No adenopathy. No bone  lesions. There are surgical clips in the gastroesophageal junction region.  IMPRESSION: Underlying emphysematous change with a questionable degree of pulmonary arterial hypertension. No edema or consolidation.   Electronically Signed   By: Lowella Grip III M.D.   On: 10/07/2014 15:14   Ct Chest Wo Contrast  10/11/2014   CLINICAL DATA:  Ex-smoker with shortness of breath and acute respiratory failure. History of coronary artery disease and prostatectomy. Initial encounter.  EXAM: CT CHEST WITHOUT CONTRAST  TECHNIQUE: Multidetector CT imaging of the chest was performed following the standard protocol without IV contrast.  COMPARISON:  Portable chest 10/11/2014. Chest CTs 11/28/2013 and 09/20/2009.  FINDINGS: Mediastinum/Nodes: No evidence of mediastinal, hilar or axillary lymphadenopathy. Hilar assessment is limited by the lack of intravenous contrast,  although the hilar contours appear grossly stable. The thyroid gland, trachea and esophagus demonstrate no significant findings. There are postsurgical changes at the gastroesophageal junction. The heart size is normal. There is no pericardial effusion.There is diffuse atherosclerosis of the aorta, great vessels and coronary arteries.  Lungs/Pleura: There is no pleural effusion. There are stable pleural calcifications along the right hemidiaphragm surface. Diffuse changes of emphysema and bronchiectasis are again demonstrated. No superimposed airspace disease or suspicious pulmonary nodule demonstrated. There is stable linear scarring in the right upper lobe and scattered peribronchial nodularity.  Upper abdomen:  Unremarkable.  There is no adrenal mass.  Musculoskeletal/Chest wall: There is no chest wall mass or suspicious osseous finding. There is a stable small sclerotic lesion in the left seventh rib laterally. Paucity of subcutaneous and mediastinal fat appears progressive.  IMPRESSION: 1. Stable chronic lung disease with emphysema, bronchiectasis and scattered scarring. 2. No evidence of acute superimposed process or suspicious pulmonary nodule. 3. Diffuse atherosclerosis.   Electronically Signed   By: Richardean Sale M.D.   On: 10/11/2014 14:43   Dg Chest Port 1 View  10/11/2014   CLINICAL DATA:  Shortness of breath and cough  EXAM: PORTABLE CHEST - 1 VIEW  COMPARISON:  10/09/2014  FINDINGS: The heart size and mediastinal contours are within normal limits. Both lungs are clear but hyperinflated. The visualized skeletal structures are unremarkable.  IMPRESSION: COPD without acute abnormality.   Electronically Signed   By: Inez Catalina M.D.   On: 10/11/2014 10:15   Dg Chest Port 1 View  10/09/2014   CLINICAL DATA:  Shortness of breath  EXAM: PORTABLE CHEST - 1 VIEW  COMPARISON:  11/05/2014  FINDINGS: Chronic interstitial markings/emphysematous changes with hyperinflation. No pleural effusion or  pneumothorax.  The heart is normal in size.  IMPRESSION: No evidence of acute cardiopulmonary disease.   Electronically Signed   By: Julian Hy M.D.   On: 10/09/2014 12:48    Microbiology: Recent Results (from the past 240 hour(s))  Culture, respiratory (NON-Expectorated)     Status: None   Collection Time: 10/07/14  5:09 PM  Result Value Ref Range Status   Specimen Description SPUTUM  Final   Special Requests NONE  Final   Gram Stain   Final    FEW WBC PRESENT,BOTH PMN AND MONONUCLEAR RARE SQUAMOUS EPITHELIAL CELLS PRESENT MODERATE GRAM POSITIVE COCCI IN PAIRS IN CLUSTERS RARE GRAM POSITIVE RODS Performed at [facility]    Culture   Final    NORMAL OROPHARYNGEAL FLORA Performed at [facility]    Report Status 10/09/2014 FINAL  Final  MRSA PCR Screening     Status: None   Collection Time: 10/07/14 11:10 PM  Result Value Ref Range Status  MRSA by PCR NEGATIVE NEGATIVE Final    Comment:        The GeneXpert MRSA Assay (FDA approved for NASAL specimens only), is one component of a comprehensive MRSA colonization surveillance program. It is not intended to diagnose MRSA infection nor to guide or monitor treatment for MRSA infections.      Labs: Basic Metabolic Panel:  Recent Labs Lab 10/08/14 0415 10/09/14 0700 10/11/14 1201 10/13/14 0450 10/14/14 0330  NA 136 136 134* 132* 134*  K 4.5 4.8 4.5 4.2 3.8  CL 97 100 97 91* 92*  CO2 29 27 29  33* 35*  GLUCOSE 160* 159* 169* 157* 101*  BUN 23 24* 21 27* 28*  CREATININE 1.43* 1.21 1.07 1.17 1.07  CALCIUM 8.9 9.0 8.8 8.4 8.5   Liver Function Tests:  Recent Labs Lab 10/07/14 1538 10/08/14 0415  AST 28 23  ALT 15 14  ALKPHOS 97 83  BILITOT 0.5 0.2*  PROT 6.5 6.0  ALBUMIN 3.6 3.2*   No results for input(s): LIPASE, AMYLASE in the last 168 hours. No results for input(s): AMMONIA in the last 168 hours. CBC:  Recent Labs Lab 10/07/14 1538 10/08/14 0415 10/08/14 1148 10/09/14 0700   WBC 7.7 5.0  --  11.6*  HGB 8.9* 7.8*  --  8.5*  HCT 28.5* 24.8* 26.0* 25.5*  MCV 83.3 84.4  --  81.2  PLT 213 199  --  173   Cardiac Enzymes: No results for input(s): CKTOTAL, CKMB, CKMBINDEX, TROPONINI in the last 168 hours. BNP: BNP (last 3 results)  Recent Labs  10/07/14 1538 10/11/14 1201  BNP 72.0 124.6*    ProBNP (last 3 results)  Recent Labs  11/27/13 1405  PROBNP 287.0    CBG: No results for input(s): GLUCAP in the last 168 hours.     Signed:  Stepen Prins A  [facility] 10/14/2014, 10:34 AM

## 2014-10-17 DIAGNOSIS — I2510 Atherosclerotic heart disease of native coronary artery without angina pectoris: Secondary | ICD-10-CM | POA: Diagnosis not present

## 2014-10-17 DIAGNOSIS — E43 Unspecified severe protein-calorie malnutrition: Secondary | ICD-10-CM | POA: Diagnosis not present

## 2014-10-17 DIAGNOSIS — J441 Chronic obstructive pulmonary disease with (acute) exacerbation: Secondary | ICD-10-CM | POA: Diagnosis not present

## 2014-10-17 DIAGNOSIS — I10 Essential (primary) hypertension: Secondary | ICD-10-CM | POA: Diagnosis not present

## 2014-10-24 ENCOUNTER — Encounter: Payer: Self-pay | Admitting: Internal Medicine

## 2014-10-24 ENCOUNTER — Ambulatory Visit (INDEPENDENT_AMBULATORY_CARE_PROVIDER_SITE_OTHER): Payer: Medicare Other | Admitting: Internal Medicine

## 2014-10-24 VITALS — BP 130/60 | HR 63 | Temp 98.2°F | Ht 65.0 in | Wt 99.4 lb

## 2014-10-24 DIAGNOSIS — J9612 Chronic respiratory failure with hypercapnia: Secondary | ICD-10-CM

## 2014-10-24 DIAGNOSIS — J449 Chronic obstructive pulmonary disease, unspecified: Secondary | ICD-10-CM | POA: Diagnosis not present

## 2014-10-24 NOTE — Progress Notes (Signed)
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   rec Symbicort 160 Take 2 puffs first thing in am and then another 2 puffs about 12 hours later.  Stop advair Stay on spiriva Work on inhaler technique: . Please schedule a follow up office visit in 4 weeks, sooner if needed with pft's     01/20/2014 f/u ov/Cristian Collins re: GOLD III COPD/ now 02 dep 2lpm 24/7  Chief Complaint  Patient presents with  . Follow-up    Pt states that his breathing continues to improve. He has minimal cough with clear sputum that he relates to PND. He c/o green nasal d/c for the past several days.   symbicort/spiriva each am/ very confused with instructions/ names of meds / now has neb also and now on 02  rec Plan A = symbicort 160 Take 2 puffs first thing in am and then another 2 puffs about 12 hours later.                 spiriva also each am Plan B= backup = proair up to 2 puff every 4 hours Plan C = Nebulizer use this up to every 4 hours if Plan B doesn't work Plan D = Doctor,  Call if needing more nebulizer treatments than usual  Work on inhaler technique     Admit date: 10/07/2014 Discharge date: 10/14/2014    Recommendations for Outpatient Follow-up:  1. Follow-up with primary care physician in one week. 2. Follow-up with Dr. Melvyn Novas on 10/24/2014 at 9 AM  Discharge Diagnoses:  Active Problems:  COPD exacerbation  Protein-calorie  malnutrition, severe  Essential hypertension  CAD (coronary artery disease)  AKI (acute kidney injury)  Normocytic anemia  Anemia, iron deficiency  Acute respiratory failure with hypoxia   Discharge Condition: Stable  Diet recommendation: Heart healthy  Filed Weights   10/12/14 0500 10/13/14 0500 10/14/14 0500  Weight: 45.9 kg (101 lb 3.1 oz) 45.314 kg (99 lb 14.4 oz) 44.906 kg (99 lb)    History of present illness:  81 year old male who has a past medical history of COPD presents to the hospital with chief complaint of shortness of breath which started 1 day PTA, patient has chronic home O2 with history of COPD and CHF. He also complains of coughing up phlegm, no fever or chills. Denies any chest pain. Patient is followed by pulmonary and cardiology as outpatient. He denies nausea vomiting or diarrhea. Admits to shortness of breath on exertion.  Hospital Course:   COPD acute exacerbation - Presented with SOB, wheezing and sputum production. - Started on bronchodilators, mucolytics, antitussives and oxygen as needed. - IV antibiotics and IV steroids. - Patient developed more shortness of breath while he was in the hospital, treated with IV Lasix - Patient received a total of 7 days of IV levofloxacin. - On discharge discharged on prednisone taper and Mucinex,  outpatient follow-up with Dr. Melvyn Novas in 10 days. - Pulmonology recommended prolonged prednisone taper and keep patient on 20 mg until he sees Dr. Melvyn Novas.  Musculoskeletal pain - Continue low dose morphine prn  Iron deficiency anemia, had some dark streaks in stools last week. Hemoglobin stable around 8.5mg /dl - Continue increased iron supplementation - Occult negative - Continue iron supplementation.  Acute kidney injury due to dehydration, creatinine trended down to 1.2 with IVF, resolved. This is resolved, patient restarted on his home dose of Demadex and the time of discharge.  Hx of a-fib s/p  ablation and pacemaker placement - Tele: NSR, tele now off  Hypertension Continue amlodipine 10 mg daily  CAD - Continue statin, metoprolol, isosorbide, ASA  Hyperlipidemia Continue Zocor  Severe protein calorie malnutrition - Nutrition consult - Supplements - Liberalize diet  Leukocytosis, likely steroid-induced  Chronic respiratory failure -Patient is on 2 L of oxygen at home, on discharge he only needed 2 L of oxygen.       10/24/2014 post hosp ov/Cristian Collins re: gold III copd / symbicort 160 2bid / spiriva/ 02 2lpm  Chief Complaint  Patient presents with  . Hospitalization Follow-up    Pt was admitted to hosp 10/07/14 and discharged 10/14/14. Pt has cough, sob at times but its getting better. Pt is on 2L pulse oxygen.  sputum is white now saba hfa 4x total since discharge   No obvious day to day or daytime variabilty or assoc cp or chest tightness, subjective wheeze overt sinus or hb symptoms. No unusual exp hx or h/o childhood pna/ asthma or knowledge of premature birth.  Sleeping ok without nocturnal  or early am exacerbation  of respiratory  c/o's or need for noct saba. Also denies any obvious fluctuation of symptoms with weather or environmental changes or other aggravating or alleviating factors except as outlined above   Current Medications, Allergies, Complete Past Medical History, Past Surgical History, Family History, and Social History were reviewed in [employer] record.  ROS  The following are not active complaints unless bolded sore throat, dysphagia, dental problems, itching, sneezing,  nasal congestion or excess/ purulent secretions, ear ache,   fever, chills, sweats, unintended wt loss, pleuritic or exertional cp, hemoptysis,  orthopnea pnd or leg swelling, presyncope, palpitations, heartburn, abdominal pain, anorexia, nausea, vomiting, diarrhea  or change in bowel or urinary habits, change in stools or urine, dysuria,hematuria,  rash,  arthralgias, visual complaints, headache, numbness weakness or ataxia or problems with walking or coordination,  change in mood/affect or memory.                    Objective:   Physical Exam     thin bm nad walking with cane mildly   hoarse  05/11/2014       105  > 09/13/2014  103 >  10/24/2014 99  Wt Readings from Last 3 Encounters:  01/20/14 104 lb (47.174 kg)  12/23/13 109 lb 12.8 oz (49.805 kg)  12/03/13 111 lb 8.8 oz (50.6 kg)     HEENT mild turbinate edema.  Edentulous with dentures in place.Oropharynx no thrush or excess pnd or cobblestoning.  No JVD or cervical adenopathy. Mild accessory muscle hypertrophy. Trachea midline, nl thryroid. Chest was hyperinflated by percussion with diminished breath sounds and moderate increased exp time without wheeze. Hoover sign positive at mid inspiration. Regular rate and rhythm without murmur gallop or rub or increase P2 or edema.  Abd: no hsm, nl excursion. Ext warm without  cyanosis or clubbing.         I personally reviewed images and agree with radiology impression as follows:  CXR:  10/11/14 Stable chronic lung disease with emphysema, bronchiectasis and scattered scarring. 2. No evidence of acute superimposed process or suspicious pulmonary nodule. 3. Diffuse atherosclerosis.     Assessment & Plan:

## 2014-10-24 NOTE — Patient Instructions (Signed)
I recommend you receive the Prevnar 13 if not already done  No change in medications  If you start needing your rescue inhaler or nebulizer more than usual we need to see you right away  Please schedule a follow up visit in 3 months but call sooner if needed

## 2014-10-30 ENCOUNTER — Encounter: Payer: Self-pay | Admitting: Internal Medicine

## 2014-10-30 NOTE — Assessment & Plan Note (Addendum)
Started p admit 11/27/13 @ 2lpm  - 01/20/2014   Walked 2lpm  x one lap @ 185 stopped due to sob no desat   - 05/11/2014  Walked 2lpm pulsed  x one lap @ 185 stopped due to sob/ no desat    - 10/13/14 HC03 33  - 10/28/2014  Walked 2lpmx 3 laps @ 185 ft each stopped due to end of study, no desats   Adequate control on present rx, reviewed > no change in rx needed  = 2lpm 24/7

## 2014-10-30 NOTE — Assessment & Plan Note (Signed)
-   PFTs 05/21/2012 0.69 (32) ratio 41 and 34% better p B2 - 09/13/2014 p extensive coaching HFA effectiveness =   90%   Back to baseline, severe copd but well compensated  I had an extended discussion with the patient reviewing all relevant studies completed to date and  lasting 15 to 20 minutes of a 25 minute visit on the following ongoing concerns:    Each maintenance medication was reviewed in detail including most importantly the difference between maintenance and as needed and under what circumstances the prns are to be used.  Please see instructions for details which were reviewed in writing and the patient given a copy.

## 2014-11-01 DIAGNOSIS — I10 Essential (primary) hypertension: Secondary | ICD-10-CM | POA: Diagnosis not present

## 2014-11-01 DIAGNOSIS — E43 Unspecified severe protein-calorie malnutrition: Secondary | ICD-10-CM | POA: Diagnosis not present

## 2014-11-01 DIAGNOSIS — I2510 Atherosclerotic heart disease of native coronary artery without angina pectoris: Secondary | ICD-10-CM | POA: Diagnosis not present

## 2014-11-01 DIAGNOSIS — J441 Chronic obstructive pulmonary disease with (acute) exacerbation: Secondary | ICD-10-CM | POA: Diagnosis not present

## 2014-11-04 DIAGNOSIS — I2510 Atherosclerotic heart disease of native coronary artery without angina pectoris: Secondary | ICD-10-CM | POA: Diagnosis not present

## 2014-11-04 DIAGNOSIS — J441 Chronic obstructive pulmonary disease with (acute) exacerbation: Secondary | ICD-10-CM | POA: Diagnosis not present

## 2014-11-04 DIAGNOSIS — E43 Unspecified severe protein-calorie malnutrition: Secondary | ICD-10-CM | POA: Diagnosis not present

## 2014-11-04 DIAGNOSIS — I10 Essential (primary) hypertension: Secondary | ICD-10-CM | POA: Diagnosis not present

## 2014-11-06 DIAGNOSIS — J449 Chronic obstructive pulmonary disease, unspecified: Secondary | ICD-10-CM | POA: Diagnosis not present

## 2014-11-12 DIAGNOSIS — J441 Chronic obstructive pulmonary disease with (acute) exacerbation: Secondary | ICD-10-CM | POA: Diagnosis not present

## 2014-11-12 DIAGNOSIS — I10 Essential (primary) hypertension: Secondary | ICD-10-CM | POA: Diagnosis not present

## 2014-11-12 DIAGNOSIS — E43 Unspecified severe protein-calorie malnutrition: Secondary | ICD-10-CM | POA: Diagnosis not present

## 2014-11-12 DIAGNOSIS — I2510 Atherosclerotic heart disease of native coronary artery without angina pectoris: Secondary | ICD-10-CM | POA: Diagnosis not present

## 2014-12-07 DIAGNOSIS — J449 Chronic obstructive pulmonary disease, unspecified: Secondary | ICD-10-CM | POA: Diagnosis not present

## 2014-12-22 DIAGNOSIS — R0602 Shortness of breath: Secondary | ICD-10-CM | POA: Diagnosis not present

## 2014-12-22 DIAGNOSIS — I2781 Cor pulmonale (chronic): Secondary | ICD-10-CM | POA: Diagnosis not present

## 2014-12-22 DIAGNOSIS — R5382 Chronic fatigue, unspecified: Secondary | ICD-10-CM | POA: Diagnosis not present

## 2014-12-22 DIAGNOSIS — R5381 Other malaise: Secondary | ICD-10-CM | POA: Diagnosis not present

## 2015-01-04 DIAGNOSIS — E1165 Type 2 diabetes mellitus with hyperglycemia: Secondary | ICD-10-CM | POA: Diagnosis not present

## 2015-01-04 DIAGNOSIS — E559 Vitamin D deficiency, unspecified: Secondary | ICD-10-CM | POA: Diagnosis not present

## 2015-01-04 DIAGNOSIS — E785 Hyperlipidemia, unspecified: Secondary | ICD-10-CM | POA: Diagnosis not present

## 2015-01-04 DIAGNOSIS — J449 Chronic obstructive pulmonary disease, unspecified: Secondary | ICD-10-CM | POA: Diagnosis not present

## 2015-01-04 DIAGNOSIS — Z1389 Encounter for screening for other disorder: Secondary | ICD-10-CM | POA: Diagnosis not present

## 2015-01-06 DIAGNOSIS — J449 Chronic obstructive pulmonary disease, unspecified: Secondary | ICD-10-CM | POA: Diagnosis not present

## 2015-02-06 DIAGNOSIS — J449 Chronic obstructive pulmonary disease, unspecified: Secondary | ICD-10-CM | POA: Diagnosis not present

## 2015-03-08 DIAGNOSIS — J449 Chronic obstructive pulmonary disease, unspecified: Secondary | ICD-10-CM | POA: Diagnosis not present

## 2015-04-05 DIAGNOSIS — I2510 Atherosclerotic heart disease of native coronary artery without angina pectoris: Secondary | ICD-10-CM | POA: Diagnosis not present

## 2015-04-05 DIAGNOSIS — I10 Essential (primary) hypertension: Secondary | ICD-10-CM | POA: Diagnosis not present

## 2015-04-05 DIAGNOSIS — J449 Chronic obstructive pulmonary disease, unspecified: Secondary | ICD-10-CM | POA: Diagnosis not present

## 2015-04-05 DIAGNOSIS — E1165 Type 2 diabetes mellitus with hyperglycemia: Secondary | ICD-10-CM | POA: Diagnosis not present

## 2015-04-05 DIAGNOSIS — Z79899 Other long term (current) drug therapy: Secondary | ICD-10-CM | POA: Diagnosis not present

## 2015-04-08 DIAGNOSIS — J449 Chronic obstructive pulmonary disease, unspecified: Secondary | ICD-10-CM | POA: Diagnosis not present

## 2015-05-09 DIAGNOSIS — J449 Chronic obstructive pulmonary disease, unspecified: Secondary | ICD-10-CM | POA: Diagnosis not present

## 2015-05-31 DIAGNOSIS — J449 Chronic obstructive pulmonary disease, unspecified: Secondary | ICD-10-CM | POA: Diagnosis not present

## 2015-06-08 DIAGNOSIS — J449 Chronic obstructive pulmonary disease, unspecified: Secondary | ICD-10-CM | POA: Diagnosis not present

## 2015-06-26 DIAGNOSIS — R0602 Shortness of breath: Secondary | ICD-10-CM | POA: Diagnosis not present

## 2015-06-26 DIAGNOSIS — I10 Essential (primary) hypertension: Secondary | ICD-10-CM | POA: Diagnosis not present

## 2015-06-26 DIAGNOSIS — I2781 Cor pulmonale (chronic): Secondary | ICD-10-CM | POA: Diagnosis not present

## 2015-06-26 DIAGNOSIS — I2510 Atherosclerotic heart disease of native coronary artery without angina pectoris: Secondary | ICD-10-CM | POA: Diagnosis not present

## 2015-07-01 DIAGNOSIS — J449 Chronic obstructive pulmonary disease, unspecified: Secondary | ICD-10-CM | POA: Diagnosis not present

## 2015-07-05 DIAGNOSIS — J449 Chronic obstructive pulmonary disease, unspecified: Secondary | ICD-10-CM | POA: Diagnosis not present

## 2015-07-05 DIAGNOSIS — Z9981 Dependence on supplemental oxygen: Secondary | ICD-10-CM | POA: Diagnosis not present

## 2015-07-05 DIAGNOSIS — I10 Essential (primary) hypertension: Secondary | ICD-10-CM | POA: Diagnosis not present

## 2015-07-05 DIAGNOSIS — E1165 Type 2 diabetes mellitus with hyperglycemia: Secondary | ICD-10-CM | POA: Diagnosis not present

## 2015-07-09 DIAGNOSIS — J449 Chronic obstructive pulmonary disease, unspecified: Secondary | ICD-10-CM | POA: Diagnosis not present

## 2015-07-31 DIAGNOSIS — J449 Chronic obstructive pulmonary disease, unspecified: Secondary | ICD-10-CM | POA: Diagnosis not present

## 2015-08-08 DIAGNOSIS — J449 Chronic obstructive pulmonary disease, unspecified: Secondary | ICD-10-CM | POA: Diagnosis not present

## 2015-08-12 ENCOUNTER — Encounter (HOSPITAL_COMMUNITY): Payer: Self-pay | Admitting: *Deleted

## 2015-08-12 ENCOUNTER — Inpatient Hospital Stay (HOSPITAL_COMMUNITY)
Admission: EM | Admit: 2015-08-12 | Discharge: 2015-08-16 | DRG: 189 | Disposition: A | Discharge status: Home Health | Payer: Medicare Other | Attending: Internal Medicine | Admitting: Internal Medicine

## 2015-08-12 ENCOUNTER — Emergency Department (HOSPITAL_COMMUNITY): Payer: Medicare Other

## 2015-08-12 DIAGNOSIS — Z955 Presence of coronary angioplasty implant and graft: Secondary | ICD-10-CM | POA: Diagnosis not present

## 2015-08-12 DIAGNOSIS — IMO0001 Reserved for inherently not codable concepts without codable children: Secondary | ICD-10-CM | POA: Insufficient documentation

## 2015-08-12 DIAGNOSIS — N179 Acute kidney failure, unspecified: Secondary | ICD-10-CM | POA: Diagnosis not present

## 2015-08-12 DIAGNOSIS — D509 Iron deficiency anemia, unspecified: Secondary | ICD-10-CM | POA: Diagnosis present

## 2015-08-12 DIAGNOSIS — Z23 Encounter for immunization: Secondary | ICD-10-CM | POA: Diagnosis present

## 2015-08-12 DIAGNOSIS — J189 Pneumonia, unspecified organism: Secondary | ICD-10-CM | POA: Diagnosis present

## 2015-08-12 DIAGNOSIS — J45909 Unspecified asthma, uncomplicated: Secondary | ICD-10-CM | POA: Diagnosis not present

## 2015-08-12 DIAGNOSIS — I509 Heart failure, unspecified: Secondary | ICD-10-CM | POA: Diagnosis present

## 2015-08-12 DIAGNOSIS — R079 Chest pain, unspecified: Secondary | ICD-10-CM | POA: Diagnosis not present

## 2015-08-12 DIAGNOSIS — R0602 Shortness of breath: Secondary | ICD-10-CM | POA: Diagnosis not present

## 2015-08-12 DIAGNOSIS — J441 Chronic obstructive pulmonary disease with (acute) exacerbation: Secondary | ICD-10-CM | POA: Diagnosis present

## 2015-08-12 DIAGNOSIS — I739 Peripheral vascular disease, unspecified: Secondary | ICD-10-CM | POA: Diagnosis not present

## 2015-08-12 DIAGNOSIS — I2510 Atherosclerotic heart disease of native coronary artery without angina pectoris: Secondary | ICD-10-CM | POA: Diagnosis not present

## 2015-08-12 DIAGNOSIS — I10 Essential (primary) hypertension: Secondary | ICD-10-CM | POA: Diagnosis present

## 2015-08-12 DIAGNOSIS — K219 Gastro-esophageal reflux disease without esophagitis: Secondary | ICD-10-CM | POA: Diagnosis present

## 2015-08-12 DIAGNOSIS — Z903 Acquired absence of stomach [part of]: Secondary | ICD-10-CM

## 2015-08-12 DIAGNOSIS — J449 Chronic obstructive pulmonary disease, unspecified: Secondary | ICD-10-CM | POA: Diagnosis not present

## 2015-08-12 DIAGNOSIS — Z96651 Presence of right artificial knee joint: Secondary | ICD-10-CM | POA: Diagnosis not present

## 2015-08-12 DIAGNOSIS — M150 Primary generalized (osteo)arthritis: Secondary | ICD-10-CM | POA: Diagnosis not present

## 2015-08-12 DIAGNOSIS — N390 Urinary tract infection, site not specified: Secondary | ICD-10-CM | POA: Diagnosis not present

## 2015-08-12 DIAGNOSIS — Z8546 Personal history of malignant neoplasm of prostate: Secondary | ICD-10-CM | POA: Diagnosis not present

## 2015-08-12 DIAGNOSIS — J9690 Respiratory failure, unspecified, unspecified whether with hypoxia or hypercapnia: Secondary | ICD-10-CM | POA: Diagnosis not present

## 2015-08-12 DIAGNOSIS — E785 Hyperlipidemia, unspecified: Secondary | ICD-10-CM | POA: Diagnosis present

## 2015-08-12 DIAGNOSIS — J9621 Acute and chronic respiratory failure with hypoxia: Principal | ICD-10-CM | POA: Diagnosis present

## 2015-08-12 DIAGNOSIS — J9601 Acute respiratory failure with hypoxia: Secondary | ICD-10-CM | POA: Diagnosis not present

## 2015-08-12 DIAGNOSIS — Z9981 Dependence on supplemental oxygen: Secondary | ICD-10-CM | POA: Diagnosis not present

## 2015-08-12 DIAGNOSIS — R0600 Dyspnea, unspecified: Secondary | ICD-10-CM | POA: Diagnosis not present

## 2015-08-12 DIAGNOSIS — D649 Anemia, unspecified: Secondary | ICD-10-CM | POA: Diagnosis not present

## 2015-08-12 DIAGNOSIS — Z87891 Personal history of nicotine dependence: Secondary | ICD-10-CM

## 2015-08-12 LAB — BRAIN NATRIURETIC PEPTIDE: B Natriuretic Peptide: 96.6 pg/mL (ref 0.0–100.0)

## 2015-08-12 LAB — CBC WITH DIFFERENTIAL/PLATELET
Basophils Absolute: 0 10*3/uL (ref 0.0–0.1)
Basophils Relative: 0 %
EOS PCT: 0 %
Eosinophils Absolute: 0 10*3/uL (ref 0.0–0.7)
HCT: 23.6 % — ABNORMAL LOW (ref 39.0–52.0)
Hemoglobin: 7.5 g/dL — ABNORMAL LOW (ref 13.0–17.0)
LYMPHS ABS: 0.8 10*3/uL (ref 0.7–4.0)
LYMPHS PCT: 7 %
MCH: 25 pg — AB (ref 26.0–34.0)
MCHC: 31.8 g/dL (ref 30.0–36.0)
MCV: 78.7 fL (ref 78.0–100.0)
MONOS PCT: 15 %
Monocytes Absolute: 1.7 10*3/uL — ABNORMAL HIGH (ref 0.1–1.0)
Neutro Abs: 9 10*3/uL — ABNORMAL HIGH (ref 1.7–7.7)
Neutrophils Relative %: 78 %
Platelets: 225 10*3/uL (ref 150–400)
RBC: 3 MIL/uL — AB (ref 4.22–5.81)
RDW: 16.9 % — ABNORMAL HIGH (ref 11.5–15.5)
WBC: 11.5 10*3/uL — AB (ref 4.0–10.5)

## 2015-08-12 LAB — I-STAT CG4 LACTIC ACID, ED: Lactic Acid, Venous: 1.04 mmol/L (ref 0.5–2.0)

## 2015-08-12 LAB — COMPREHENSIVE METABOLIC PANEL
ALK PHOS: 83 U/L (ref 38–126)
ALT: 11 U/L — ABNORMAL LOW (ref 17–63)
ANION GAP: 9 (ref 5–15)
AST: 21 U/L (ref 15–41)
Albumin: 2.7 g/dL — ABNORMAL LOW (ref 3.5–5.0)
BUN: 16 mg/dL (ref 6–20)
CALCIUM: 8.1 mg/dL — AB (ref 8.9–10.3)
CO2: 25 mmol/L (ref 22–32)
Chloride: 105 mmol/L (ref 101–111)
Creatinine, Ser: 1.45 mg/dL — ABNORMAL HIGH (ref 0.61–1.24)
GFR calc non Af Amer: 43 mL/min — ABNORMAL LOW (ref >60)
GFR, EST AFRICAN AMERICAN: 50 mL/min — AB (ref >60)
Glucose, Bld: 105 mg/dL — ABNORMAL HIGH (ref 65–99)
Potassium: 3.9 mmol/L (ref 3.5–5.1)
SODIUM: 139 mmol/L (ref 135–145)
TOTAL PROTEIN: 5 g/dL — AB (ref 6.5–8.1)
Total Bilirubin: 0.4 mg/dL (ref 0.3–1.2)

## 2015-08-12 LAB — I-STAT TROPONIN, ED: Troponin i, poc: 0 ng/mL (ref 0.00–0.08)

## 2015-08-12 MED ORDER — ISOSORBIDE MONONITRATE ER 60 MG PO TB24
60.0000 mg | ORAL_TABLET | Freq: Every day | ORAL | Status: DC
  Administered 2015-08-13 – 2015-08-16 (×4): 60 mg via ORAL
  Filled 2015-08-12 (×4): qty 1

## 2015-08-12 MED ORDER — SODIUM CHLORIDE 0.9 % IV BOLUS (SEPSIS)
500.0000 mL | Freq: Once | INTRAVENOUS | Status: AC
  Administered 2015-08-12: 500 mL via INTRAVENOUS

## 2015-08-12 MED ORDER — SIMVASTATIN 20 MG PO TABS
20.0000 mg | ORAL_TABLET | Freq: Every day | ORAL | Status: DC
  Administered 2015-08-13 – 2015-08-16 (×4): 20 mg via ORAL
  Filled 2015-08-12 (×4): qty 1

## 2015-08-12 MED ORDER — BUDESONIDE-FORMOTEROL FUMARATE 160-4.5 MCG/ACT IN AERO
2.0000 | INHALATION_SPRAY | Freq: Two times a day (BID) | RESPIRATORY_TRACT | Status: DC
  Administered 2015-08-13 – 2015-08-16 (×7): 2 via RESPIRATORY_TRACT
  Filled 2015-08-12: qty 6

## 2015-08-12 MED ORDER — LEVOFLOXACIN IN D5W 750 MG/150ML IV SOLN
750.0000 mg | Freq: Once | INTRAVENOUS | Status: AC
  Administered 2015-08-12: 750 mg via INTRAVENOUS
  Filled 2015-08-12: qty 150

## 2015-08-12 MED ORDER — AZITHROMYCIN 500 MG PO TABS
250.0000 mg | ORAL_TABLET | Freq: Every day | ORAL | Status: DC
  Administered 2015-08-14: 250 mg via ORAL
  Filled 2015-08-12 (×2): qty 1

## 2015-08-12 MED ORDER — ASPIRIN EC 81 MG PO TBEC
81.0000 mg | DELAYED_RELEASE_TABLET | Freq: Every day | ORAL | Status: DC
  Administered 2015-08-13 – 2015-08-16 (×4): 81 mg via ORAL
  Filled 2015-08-12 (×4): qty 1

## 2015-08-12 MED ORDER — PREDNISONE 50 MG PO TABS
50.0000 mg | ORAL_TABLET | Freq: Every day | ORAL | Status: DC
  Administered 2015-08-13: 50 mg via ORAL
  Filled 2015-08-12: qty 1

## 2015-08-12 MED ORDER — SPIRONOLACTONE 25 MG PO TABS: 25.0000 mg | ORAL_TABLET | ORAL | Status: DC

## 2015-08-12 MED ORDER — SODIUM CHLORIDE 0.9 % IV SOLN
INTRAVENOUS | Status: DC
  Administered 2015-08-13: 01:00:00 via INTRAVENOUS

## 2015-08-12 MED ORDER — ENSURE ENLIVE PO LIQD
237.0000 mL | Freq: Three times a day (TID) | ORAL | Status: DC
  Administered 2015-08-13 – 2015-08-16 (×10): 237 mL via ORAL

## 2015-08-12 MED ORDER — NITROGLYCERIN 0.4 MG SL SUBL: 0.4000 mg | SUBLINGUAL_TABLET | SUBLINGUAL | Status: DC | PRN

## 2015-08-12 MED ORDER — AZITHROMYCIN 500 MG PO TABS
500.0000 mg | ORAL_TABLET | Freq: Every day | ORAL | Status: AC
  Administered 2015-08-13: 500 mg via ORAL
  Filled 2015-08-12 (×2): qty 1

## 2015-08-12 MED ORDER — PANTOPRAZOLE SODIUM 40 MG IV SOLR
40.0000 mg | INTRAVENOUS | Status: DC
  Administered 2015-08-13: 40 mg via INTRAVENOUS
  Filled 2015-08-12: qty 40

## 2015-08-12 MED ORDER — ENOXAPARIN SODIUM 30 MG/0.3ML ~~LOC~~ SOLN
30.0000 mg | SUBCUTANEOUS | Status: DC
  Administered 2015-08-13 – 2015-08-16 (×4): 30 mg via SUBCUTANEOUS
  Filled 2015-08-12 (×4): qty 0.3

## 2015-08-12 MED ORDER — ACETAMINOPHEN 325 MG PO TABS
650.0000 mg | ORAL_TABLET | ORAL | Status: DC | PRN
  Administered 2015-08-12 – 2015-08-15 (×10): 650 mg via ORAL
  Filled 2015-08-12 (×11): qty 2

## 2015-08-12 MED ORDER — IPRATROPIUM-ALBUTEROL 0.5-2.5 (3) MG/3ML IN SOLN
3.0000 mL | Freq: Four times a day (QID) | RESPIRATORY_TRACT | Status: DC
  Administered 2015-08-13 (×4): 3 mL via RESPIRATORY_TRACT
  Filled 2015-08-12 (×3): qty 3

## 2015-08-12 MED ORDER — TIOTROPIUM BROMIDE MONOHYDRATE 18 MCG IN CAPS
18.0000 ug | ORAL_CAPSULE | Freq: Every day | RESPIRATORY_TRACT | Status: DC
  Administered 2015-08-13 – 2015-08-16 (×4): 18 ug via RESPIRATORY_TRACT
  Filled 2015-08-12: qty 5

## 2015-08-12 MED ORDER — AMLODIPINE BESYLATE 10 MG PO TABS
10.0000 mg | ORAL_TABLET | Freq: Every day | ORAL | Status: DC
  Administered 2015-08-13 – 2015-08-16 (×4): 10 mg via ORAL
  Filled 2015-08-12 (×4): qty 1

## 2015-08-12 MED ORDER — INFLUENZA VAC SPLIT QUAD 0.5 ML IM SUSY
0.5000 mL | PREFILLED_SYRINGE | INTRAMUSCULAR | Status: AC
  Administered 2015-08-13: 0.5 mL via INTRAMUSCULAR
  Filled 2015-08-12: qty 0.5

## 2015-08-12 NOTE — ED Notes (Signed)
Attempted report 
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
## 2015-08-12 NOTE — ED Notes (Signed)
Dr Yelverton at bedside.  

## 2015-08-12 NOTE — ED Notes (Signed)
Admitting MD at bedside.

## 2015-08-12 NOTE — Progress Notes (Signed)
Received report from [place], ED RN

## 2015-08-12 NOTE — ED Notes (Signed)
Pt given water to drink. 

## 2015-08-12 NOTE — ED Notes (Signed)
Spoke to MD regarding antibiotics, awaiting orders

## 2015-08-12 NOTE — ED Notes (Signed)
Pt to ED from home by EMS c/o shortness of breath. Pt reports onset of shortness of breath on Monday, worsening today. Hx of COPd, always on home oxygen at 2L, Pt had one albuterol treatment at home prior to EMS. EMS gave 125mg  solumedrol and a duoneb. Pt also c/o R sided chest pain, worse with coughing
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
## 2015-08-13 ENCOUNTER — Other Ambulatory Visit (HOSPITAL_COMMUNITY): Payer: Medicare Other

## 2015-08-13 DIAGNOSIS — N390 Urinary tract infection, site not specified: Secondary | ICD-10-CM | POA: Diagnosis not present

## 2015-08-13 DIAGNOSIS — N179 Acute kidney failure, unspecified: Secondary | ICD-10-CM | POA: Diagnosis not present

## 2015-08-13 DIAGNOSIS — Z23 Encounter for immunization: Secondary | ICD-10-CM | POA: Diagnosis not present

## 2015-08-13 DIAGNOSIS — J189 Pneumonia, unspecified organism: Secondary | ICD-10-CM | POA: Diagnosis not present

## 2015-08-13 DIAGNOSIS — J45909 Unspecified asthma, uncomplicated: Secondary | ICD-10-CM | POA: Diagnosis not present

## 2015-08-13 DIAGNOSIS — Z8546 Personal history of malignant neoplasm of prostate: Secondary | ICD-10-CM | POA: Diagnosis not present

## 2015-08-13 DIAGNOSIS — J9601 Acute respiratory failure with hypoxia: Secondary | ICD-10-CM

## 2015-08-13 DIAGNOSIS — J9621 Acute and chronic respiratory failure with hypoxia: Secondary | ICD-10-CM | POA: Diagnosis not present

## 2015-08-13 DIAGNOSIS — I739 Peripheral vascular disease, unspecified: Secondary | ICD-10-CM | POA: Diagnosis not present

## 2015-08-13 DIAGNOSIS — I2510 Atherosclerotic heart disease of native coronary artery without angina pectoris: Secondary | ICD-10-CM | POA: Diagnosis not present

## 2015-08-13 DIAGNOSIS — Z903 Acquired absence of stomach [part of]: Secondary | ICD-10-CM | POA: Diagnosis not present

## 2015-08-13 DIAGNOSIS — Z96651 Presence of right artificial knee joint: Secondary | ICD-10-CM | POA: Diagnosis not present

## 2015-08-13 DIAGNOSIS — Z87891 Personal history of nicotine dependence: Secondary | ICD-10-CM | POA: Diagnosis not present

## 2015-08-13 DIAGNOSIS — J441 Chronic obstructive pulmonary disease with (acute) exacerbation: Secondary | ICD-10-CM | POA: Diagnosis not present

## 2015-08-13 DIAGNOSIS — E785 Hyperlipidemia, unspecified: Secondary | ICD-10-CM | POA: Diagnosis not present

## 2015-08-13 DIAGNOSIS — D649 Anemia, unspecified: Secondary | ICD-10-CM | POA: Diagnosis not present

## 2015-08-13 DIAGNOSIS — I509 Heart failure, unspecified: Secondary | ICD-10-CM | POA: Diagnosis not present

## 2015-08-13 DIAGNOSIS — K219 Gastro-esophageal reflux disease without esophagitis: Secondary | ICD-10-CM | POA: Diagnosis not present

## 2015-08-13 DIAGNOSIS — Z9981 Dependence on supplemental oxygen: Secondary | ICD-10-CM | POA: Diagnosis not present

## 2015-08-13 DIAGNOSIS — Z955 Presence of coronary angioplasty implant and graft: Secondary | ICD-10-CM | POA: Diagnosis not present

## 2015-08-13 DIAGNOSIS — I10 Essential (primary) hypertension: Secondary | ICD-10-CM | POA: Diagnosis not present

## 2015-08-13 DIAGNOSIS — D509 Iron deficiency anemia, unspecified: Secondary | ICD-10-CM | POA: Diagnosis not present

## 2015-08-13 LAB — IRON AND TIBC
Iron: 15 ug/dL — ABNORMAL LOW (ref 45–182)
Saturation Ratios: 4 % — ABNORMAL LOW (ref 17.9–39.5)
TIBC: 410 ug/dL (ref 250–450)
UIBC: 395 ug/dL

## 2015-08-13 LAB — TYPE AND SCREEN
ABO/RH(D): A POS
ANTIBODY SCREEN: NEGATIVE

## 2015-08-13 LAB — URINALYSIS, ROUTINE W REFLEX MICROSCOPIC
Bilirubin Urine: NEGATIVE
Glucose, UA: NEGATIVE mg/dL
HGB URINE DIPSTICK: NEGATIVE
Ketones, ur: NEGATIVE mg/dL
Leukocytes, UA: NEGATIVE
Nitrite: NEGATIVE
PROTEIN: NEGATIVE mg/dL
SPECIFIC GRAVITY, URINE: 1.01 (ref 1.005–1.030)
pH: 5 (ref 5.0–8.0)

## 2015-08-13 LAB — COMPREHENSIVE METABOLIC PANEL
ALBUMIN: 2.7 g/dL — AB (ref 3.5–5.0)
ALK PHOS: 80 U/L (ref 38–126)
ALT: 13 U/L — AB (ref 17–63)
ANION GAP: 11 (ref 5–15)
AST: 27 U/L (ref 15–41)
BILIRUBIN TOTAL: 0.4 mg/dL (ref 0.3–1.2)
BUN: 18 mg/dL (ref 6–20)
CALCIUM: 8.4 mg/dL — AB (ref 8.9–10.3)
CO2: 25 mmol/L (ref 22–32)
CREATININE: 1.58 mg/dL — AB (ref 0.61–1.24)
Chloride: 100 mmol/L — ABNORMAL LOW (ref 101–111)
GFR calc Af Amer: 45 mL/min — ABNORMAL LOW (ref >60)
GFR calc non Af Amer: 39 mL/min — ABNORMAL LOW (ref >60)
GLUCOSE: 242 mg/dL — AB (ref 65–99)
Potassium: 4.2 mmol/L (ref 3.5–5.1)
Sodium: 136 mmol/L (ref 135–145)
TOTAL PROTEIN: 5.7 g/dL — AB (ref 6.5–8.1)

## 2015-08-13 LAB — CREATININE, URINE, RANDOM: CREATININE, URINE: 32.26 mg/dL

## 2015-08-13 LAB — RETICULOCYTES
RBC.: 2.92 MIL/uL — AB (ref 4.22–5.81)
RETIC CT PCT: 1.3 % (ref 0.4–3.1)
Retic Count, Absolute: 38 10*3/uL (ref 19.0–186.0)

## 2015-08-13 LAB — CBC
HCT: 22.4 % — ABNORMAL LOW (ref 39.0–52.0)
HEMOGLOBIN: 7.2 g/dL — AB (ref 13.0–17.0)
MCH: 25 pg — ABNORMAL LOW (ref 26.0–34.0)
MCHC: 32.1 g/dL (ref 30.0–36.0)
MCV: 77.8 fL — ABNORMAL LOW (ref 78.0–100.0)
PLATELETS: 197 10*3/uL (ref 150–400)
RBC: 2.88 MIL/uL — ABNORMAL LOW (ref 4.22–5.81)
RDW: 16.5 % — AB (ref 11.5–15.5)
WBC: 10.3 10*3/uL (ref 4.0–10.5)

## 2015-08-13 LAB — VITAMIN B12: VITAMIN B 12: 314 pg/mL (ref 180–914)

## 2015-08-13 LAB — FERRITIN: FERRITIN: 10 ng/mL — AB (ref 24–336)

## 2015-08-13 LAB — SODIUM, URINE, RANDOM: SODIUM UR: 112 mmol/L

## 2015-08-13 LAB — FOLATE: Folate: 20.3 ng/mL (ref >5.9)

## 2015-08-13 MED ORDER — IPRATROPIUM-ALBUTEROL 0.5-2.5 (3) MG/3ML IN SOLN
3.0000 mL | Freq: Three times a day (TID) | RESPIRATORY_TRACT | Status: DC
  Administered 2015-08-14 – 2015-08-16 (×7): 3 mL via RESPIRATORY_TRACT
  Filled 2015-08-13 (×5): qty 3

## 2015-08-13 MED ORDER — METHYLPREDNISOLONE SODIUM SUCC 125 MG IJ SOLR
60.0000 mg | Freq: Three times a day (TID) | INTRAMUSCULAR | Status: DC
  Administered 2015-08-13 – 2015-08-15 (×7): 60 mg via INTRAVENOUS
  Filled 2015-08-13 (×7): qty 2

## 2015-08-13 MED ORDER — IPRATROPIUM-ALBUTEROL 0.5-2.5 (3) MG/3ML IN SOLN
3.0000 mL | RESPIRATORY_TRACT | Status: DC | PRN
  Filled 2015-08-13: qty 3

## 2015-08-13 MED ORDER — PANTOPRAZOLE SODIUM 40 MG PO TBEC
40.0000 mg | DELAYED_RELEASE_TABLET | Freq: Every day | ORAL | Status: DC
  Administered 2015-08-13 – 2015-08-16 (×4): 40 mg via ORAL
  Filled 2015-08-13 (×3): qty 1

## 2015-08-13 MED ORDER — SODIUM CHLORIDE 0.9 % IV SOLN
INTRAVENOUS | Status: DC
  Administered 2015-08-13 – 2015-08-14 (×2): via INTRAVENOUS

## 2015-08-13 NOTE — Progress Notes (Signed)
Patient Demographics:    Cristian Collins, is a 82 y.o. male, DOB - 10-06-1929, [id number]  Admit date - 08/12/2015   Admitting Physician Gennaro Africa, MD  Outpatient Primary MD for the patient is [name], MD  LOS - 1   Chief Complaint  Patient presents with  . Shortness of Breath        Subjective:    Cristian Collins today has, No headache, No chest pain, No abdominal pain - No Nausea, No new weakness tingling or numbness, No Cough - Improved SOB.     Assessment  & Plan :     1. Acute on chronic COPD exacerbation - he is already on 2 L nasal cannula oxygen and has advanced emphysema predominant COPD at baseline, has been placed on IV steroids, empiric Levaquin, oxygen as needed to keep pulse ox over 90% and nebulizer treatments. He somewhat improved we'll continue to monitor with supportive care. Echo ordered will monitor. No clinical signs of CHF, no clinical suspicion of DVT PE.  2. History of smoking. Counseled to quit.   3. ARF. Hold diuretics, nephrotoxins. Hydrate. Check FeNa and repeat BMP.   4. Essential hypertension. On Norvasc continue.   5. Dyslipidemia. On statin   6. History of CAD. No acute issues. Continue aspirin and statin for secondary prevention. Echogram pending.    Code Status : Full  Family Communication  : None  Disposition Plan  : Home 1-2 days  Consults  :    Procedures  :    TTE   DVT Prophylaxis  :  Lovenox    Lab Results  Component Value Date   PLT 197 08/13/2015    Inpatient Medications  Scheduled Meds: . amLODipine  10 mg Oral Daily  . aspirin EC  81 mg Oral Daily  . azithromycin  500 mg Oral Daily   Followed by  . [START ON 08/14/2015] azithromycin  250 mg Oral Daily  . budesonide-formoterol  2 puff Inhalation BID  .  enoxaparin (LOVENOX) injection  30 mg Subcutaneous Q24H  . feeding supplement (ENSURE ENLIVE)  237 mL Oral TID BM  . Influenza vac split quadrivalent PF  0.5 mL Intramuscular Tomorrow-1000  . ipratropium-albuterol  3 mL Nebulization Q6H  . isosorbide mononitrate  60 mg Oral Daily  . methylPREDNISolone (SOLU-MEDROL) injection  60 mg Intravenous TID  . pantoprazole (PROTONIX) IV  40 mg Intravenous Q24H  . simvastatin  20 mg Oral Daily  . tiotropium  18 mcg Inhalation Daily   Continuous Infusions: . sodium chloride     PRN Meds:.acetaminophen, nitroGLYCERIN  Antibiotics  :     Anti-infectives    Start     Dose/Rate Route Frequency Ordered Stop   08/14/15 1000  azithromycin (ZITHROMAX) tablet 250 mg     250 mg Oral Daily 08/12/15 2224 08/18/15 0959   08/13/15 1000  azithromycin (ZITHROMAX) tablet 500 mg     500 mg Oral Daily 08/12/15 2224 08/14/15 0959   08/12/15 2200  levofloxacin (LEVAQUIN) IVPB 750 mg     750 mg 100 mL/hr over 90 Minutes Intravenous  Once 08/12/15 2154 08/12/15 2348        Objective:   Filed Vitals:   08/12/15 2311 08/12/15 2331 08/13/15 0216 08/13/15  0820  BP:  142/48    Pulse:  94    Temp: 99.8 F (37.7 C) 98.9 F (37.2 C)    TempSrc: Oral Oral    Resp:  15    Height:      Weight:      SpO2:  100% 99% 99%    Wt Readings from Last 3 Encounters:  08/12/15 46.72 kg (103 lb)  10/24/14 45.088 kg (99 lb 6.4 oz)  10/14/14 44.906 kg (99 lb)     Intake/Output Summary (Last 24 hours) at 08/13/15 0908 Last data filed at 08/13/15 0602  Gross per 24 hour  Intake 1225.83 ml  Output    625 ml  Net 600.83 ml     Physical Exam  Awake Alert, Oriented X 3, No new F.N deficits, Normal affect [place].AT,PERRAL Supple Neck,No JVD, No cervical lymphadenopathy appriciated.  Symmetrical Chest wall movement, Mod air movement bilaterally, few wheezes RRR,No Gallops,Rubs or new Murmurs, No Parasternal Heave +ve B.Sounds, Abd Soft, No tenderness, No organomegaly  appriciated, No rebound - guarding or rigidity. No Cyanosis, Clubbing or edema, No new Rash or bruise     Data Review:   Micro Results No results found for this or any previous visit (from the past 240 hour(s)).  Radiology Reports Dg Chest Portable 1 View  08/12/2015  CLINICAL DATA:  82 year old male with acute shortness of breath and right chest pain. EXAM: PORTABLE CHEST 1 VIEW COMPARISON:  10/11/2014 and prior exams FINDINGS: The cardiomediastinal silhouette is unremarkable. COPD/ emphysema again identified. There is no evidence of focal airspace disease, pulmonary edema, suspicious pulmonary nodule/mass, pleural effusion, or pneumothorax. No acute bony abnormalities are identified. IMPRESSION: No active disease. Electronically Signed   By: Margarette Canada M.D.   On: 08/12/2015 21:39     CBC  Recent Labs Lab 08/12/15 2105 08/13/15 0410  WBC 11.5* 10.3  HGB 7.5* 7.2*  HCT 23.6* 22.4*  PLT 225 197  MCV 78.7 77.8*  MCH 25.0* 25.0*  MCHC 31.8 32.1  RDW 16.9* 16.5*  LYMPHSABS 0.8  --   MONOABS 1.7*  --   EOSABS 0.0  --   BASOSABS 0.0  --     Chemistries   Recent Labs Lab 08/12/15 2213 08/13/15 0410  NA 139 136  K 3.9 4.2  CL 105 100*  CO2 25 25  GLUCOSE 105* 242*  BUN 16 18  CREATININE 1.45* 1.58*  CALCIUM 8.1* 8.4*  AST 21 27  ALT 11* 13*  ALKPHOS 83 80  BILITOT 0.4 0.4   ------------------------------------------------------------------------------------------------------------------ estimated creatinine clearance is 23.4 mL/min (by C-G formula based on Cr of 1.58). ------------------------------------------------------------------------------------------------------------------ No results for input(s): HGBA1C in the last 72 hours. ------------------------------------------------------------------------------------------------------------------ No results for input(s): CHOL, HDL, LDLCALC, TRIG, CHOLHDL, LDLDIRECT in the last 72  hours. ------------------------------------------------------------------------------------------------------------------ No results for input(s): TSH, T4TOTAL, T3FREE, THYROIDAB in the last 72 hours.  Invalid input(s): FREET3 ------------------------------------------------------------------------------------------------------------------ No results for input(s): VITAMINB12, FOLATE, FERRITIN, TIBC, IRON, RETICCTPCT in the last 72 hours.  Coagulation profile No results for input(s): INR, PROTIME in the last 168 hours.  No results for input(s): DDIMER in the last 72 hours.  Cardiac Enzymes No results for input(s): CKMB, TROPONINI, MYOGLOBIN in the last 168 hours.  Invalid input(s): CK ------------------------------------------------------------------------------------------------------------------ Invalid input(s): POCBNP   Time Spent in minutes   35   SINGH,PRASHANT K M.D on 08/13/2015 at 9:08 AM  Between 7am to 7pm - Pager - [phone]  After 7pm go to [email or web address] - password [email or web address]  [facility] -  Office  [phone]

## 2015-08-13 NOTE — Care Management Note (Signed)
Case Management Note  Patient Details  Name: Cristian Collins MRN: [id number] Date of Birth: [birth year]  Subjective/Objective:                    Action/Plan: Anticipate discharge home 1-2 days per MD note. CM will f/u on Medication needs following the Holiday.  Expected Discharge Date:                  Expected Discharge Plan:     In-House Referral:     Discharge planning Services     Post Acute Care Choice:    Choice offered to:     DME Arranged:    DME Agency:     HH Arranged:    [place] Agency:     Status of Service:     Medicare Important Message Given:    Date Medicare IM Given:    Medicare IM give by:    Date Additional Medicare IM Given:    Additional Medicare Important Message give by:     If discussed at [place] of Stay Meetings, dates discussed:    Additional Comments:  Delrae Sawyers, RN 08/13/2015, 3:16 PM

## 2015-08-13 NOTE — Progress Notes (Signed)
NURSING PROGRESS NOTE  JAQUAVION LEFTRIDGE [id number] Admission Data: 08/13/2015 12:47 AM Attending Provider: Gennaro Africa, MD [id number] N, MD Code Status: Full  Allergies:  Review of patient's allergies indicates no known allergies. Past Medical History:   has a past medical history of COPD (chronic obstructive pulmonary disease) ([place]); Hypertension; Hyperlipidemia; History of peptic ulcer disease; Anemia; Gastritis; Urinary retention; History of hypokalemia; Hyponatremia; Prostate cancer ([place]); Coronary artery disease; Chest pain; Pneumonia; Chronic bronchitis ([place]); Exertional shortness of breath; Migraines (1960's); Right knee DJD; Anginal pain ([place]); Peripheral vascular disease ([employer]); CHF (congestive heart failure) ([place]); Asthma; GERD (gastroesophageal reflux disease); H/O hiatal hernia; Blood dyscrasia; and AKI (acute kidney injury) ([place]) (10/08/2014). Past Surgical History:   has past surgical history that includes Partial gastrectomy (1950); Prostatectomy (~ 2006); Replacement total knee (Right, 11/22/2010); Tonsillectomy (~ 1949); Appendectomy (~ 1955); Cardiac catheterization (07/2012); Coronary angioplasty with stent (03/09/2013); Joint replacement; left heart catheterization with coronary angiogram (N/A, 07/28/2012); left heart catheterization with coronary angiogram (N/A, 03/09/2013); percutaneous coronary stent intervention (pci-s) (03/09/2013); and left heart catheterization with coronary angiogram (N/A, 07/21/2013). Social History:   reports that he quit smoking about 47 years ago. His smoking use included Cigarettes. He has a 20 pack-year smoking history. He has never used smokeless tobacco. He reports that he does not use illicit drugs.  ZYIAN BROKENSHIRE is a 82 y.o. male patient admitted from ED:   Last Documented Vital Signs: Blood pressure 142/48, pulse 94, temperature 98.9 F (37.2 C), temperature source Oral, resp. rate 15, height 5\' 5"  (1.651 m), weight 46.72 kg (103 lb), SpO2 100  %.  Cardiac Monitoring:   IV Fluids:  IV in place, occlusive dsg intact without redness, IV cath antecubital left, condition patent and no redness normal saline.   Skin: Dry and intact, with abrasions on bilateral shins  Patient/Family orientated to room. Information packet given to patient/family. Admission inpatient armband information verified with patient/family to include name and date of birth and placed on patient arm. Side rails up x 2, fall assessment and education completed with patient/family. Patient/family able to verbalize understanding of risk associated with falls and verbalized understanding to call for assistance before getting out of bed. Call light within reach. Patient/family able to voice and demonstrate understanding of unit orientation instructions.    Will continue to evaluate and treat per MD orders.  Clydell Hakim RN, BSN

## 2015-08-14 ENCOUNTER — Inpatient Hospital Stay (HOSPITAL_COMMUNITY): Payer: Medicare Other

## 2015-08-14 DIAGNOSIS — I509 Heart failure, unspecified: Secondary | ICD-10-CM

## 2015-08-14 LAB — BASIC METABOLIC PANEL
Anion gap: 8 (ref 5–15)
BUN: 26 mg/dL — AB (ref 6–20)
CHLORIDE: 107 mmol/L (ref 101–111)
CO2: 24 mmol/L (ref 22–32)
CREATININE: 1.21 mg/dL (ref 0.61–1.24)
Calcium: 8.2 mg/dL — ABNORMAL LOW (ref 8.9–10.3)
GFR calc Af Amer: >60 mL/min (ref >60)
GFR calc non Af Amer: 54 mL/min — ABNORMAL LOW (ref >60)
Glucose, Bld: 151 mg/dL — ABNORMAL HIGH (ref 65–99)
Potassium: 4.5 mmol/L (ref 3.5–5.1)
SODIUM: 139 mmol/L (ref 135–145)

## 2015-08-14 MED ORDER — SODIUM CHLORIDE 0.9 % IV SOLN: INTRAVENOUS | Status: DC

## 2015-08-14 NOTE — Care Management Note (Signed)
Case Management Note  Patient Details  Name: Cristian Collins MRN: [id number] Date of Birth: June 18, 1930  Subjective/Objective:     Date: 08/14/15 Spoke with patient at the bedside.  Introduced self as Tourist information centre manager and explained role in discharge planning and how to be reached.  Verified patient lives in town, with spouse. Has cane and a rolling walker at home. Expressed potential need for no other DME.  Verified patient anticipates to go home with family, at time of discharge and will have full-time supervision by family at this time to best of their knowledge.  Patient denied needing help with their medication, but states the symbicort and Spiriva are expensive and he use to be in a patient assistance program which he is no longer eligible for.  Patient  is driven by spouse to MD appointments.  Verified patient has PCP Glendale Chard. Patient is not active with any [place] agency at this time to best of his knowledge.  Awaiting pt eval.   Plan: CM will continue to follow for discharge planning and [facility] resources.                Action/Plan:   Expected Discharge Date:                  Expected Discharge Plan:  [place]  In-House Referral:     Discharge planning Services  CM Consult  Post Acute Care Choice:    Choice offered to:     DME Arranged:    DME Agency:     HH Arranged:    [place] Agency:     Status of Service:  In process, will continue to follow  Medicare Important Message Given:    Date Medicare IM Given:    Medicare IM give by:    Date Additional Medicare IM Given:    Additional Medicare Important Message give by:     If discussed at [place] of Stay Meetings, dates discussed:    Additional Comments:  Zenon Mayo, RN 08/14/2015, 1:53 PM

## 2015-08-14 NOTE — Evaluation (Signed)
Physical Therapy Evaluation Patient Details Name: Cristian Collins MRN: [id number] DOB: 1930/05/10 Today's Date: 08/14/2015   History of Present Illness  Pt adm with acute on chronic COPD. PMH - chf, htn, pvd, cad  Clinical Impression  Pt admitted with above diagnosis and presents to PT with functional limitations due to deficits listed below (See PT problem list). Pt needs skilled PT to maximize independence and safety to allow discharge to home with family. Pt with continued decline in functional activity tolerance at home per his report. Per his report supportive family at home that are willing and able to assist him. Pt with long term home O2 use. Pt's decr activity tolerance has affected his bed mobility, ambulation, standing tolerance and ability to perform household tasks.     Follow Up Recommendations Home health PT;Supervision for mobility/OOB    Equipment Recommendations  None recommended by PT    Recommendations for Other Services       Precautions / Restrictions Precautions Precautions: Fall      Mobility  Bed Mobility Overal bed mobility: Modified Independent             General bed mobility comments: Incr time and effort to perform  Transfers Overall transfer level: Needs assistance Equipment used: Rolling walker (2 wheeled) Transfers: Sit to/from Stand Sit to Stand: Min guard         General transfer comment: Assist for balance and stability  Ambulation/Gait Ambulation/Gait assistance: Min assist Ambulation Distance (Feet): 75 Feet Assistive device: Rolling walker (2 wheeled) Gait Pattern/deviations: Step-through pattern;Decreased step length - right;Decreased step length - left;Shuffle;Trunk flexed   Gait velocity interpretation: <1.8 ft/sec, indicative of risk for recurrent falls General Gait Details: Verbal cues to stand more erect. Pt fatigues quickly and becoming slightly tremulous prior to sitting. Pt amb on 3L of O2 with dyspnea incr to 3/4  with amb.   Stairs            Wheelchair Mobility    Modified Rankin (Stroke Patients Only)       Balance Overall balance assessment: Needs assistance Sitting-balance support: No upper extremity supported;Feet supported Sitting balance-Leahy Scale: Good     Standing balance support: Bilateral upper extremity supported Standing balance-Leahy Scale: Poor Standing balance comment: support of walker and supervision for static standing.                             Pertinent Vitals/Pain Pain Assessment: No/denies pain    Home Living Family/patient expects to be discharged to:: Private residence Living Arrangements: Spouse/significant other;Children Available Help at Discharge: Family;Available 24 hours/day Type of Home: House Home Access: Stairs to enter   [employer] of Steps: 1 + 2 no rails Home Layout: One level Home Equipment: Walker - 2 wheels;Cane - single point;Bedside commode;Shower seat Additional Comments: Pt reports he sponge bathes. History of falls at home.    Prior Function Level of Independence: Independent with assistive device(s)         Comments: Uses cane or walker. Amb distance has been decreasing last year.      Hand Dominance   Dominant Hand: Right    Extremity/Trunk Assessment   Upper Extremity Assessment: Generalized weakness           Lower Extremity Assessment: Generalized weakness         Communication   Communication: No difficulties  Cognition Arousal/Alertness: Awake/alert Behavior During Therapy: WFL for tasks assessed/performed Overall Cognitive Status: Within Functional  Limits for tasks assessed                      General Comments      Exercises        Assessment/Plan    PT Assessment Patient needs continued PT services  PT Diagnosis Difficulty walking;Generalized weakness   PT Problem List Decreased strength;Decreased activity tolerance;Decreased balance;Decreased  mobility;Cardiopulmonary status limiting activity  PT Treatment Interventions DME instruction;Gait training;Functional mobility training;Therapeutic activities;Therapeutic exercise;Balance training;Patient/family education   PT Goals (Current goals can be found in the Care Plan section) Acute Rehab PT Goals Patient Stated Goal: Return home with family PT Goal Formulation: With patient Time For Goal Achievement: 08/21/15 Potential to Achieve Goals: Good    Frequency Min 3X/week   Barriers to discharge        Co-evaluation               End of Session Equipment Utilized During Treatment: Gait belt;Oxygen Activity Tolerance: Patient limited by fatigue Patient left: in chair;with call bell/phone within reach Nurse Communication: Mobility status (nurse tech and communication board)         Time: [phone] PT Time Calculation (min) (ACUTE ONLY): 13 min   Charges:   PT Evaluation $Initial PT Evaluation Tier I: 1 Procedure     PT G Codes:        Cristian Collins August 18, 2015, [name]

## 2015-08-14 NOTE — Progress Notes (Signed)
Patient Demographics:    Cristian Collins, is a 82 y.o. male, DOB - 01-31-1930, [id number]  Admit date - 08/12/2015   Admitting Physician Gennaro Africa, MD  Outpatient Primary MD for the patient is [name], MD  LOS - 2   Chief Complaint  Patient presents with  . Shortness of Breath        Subjective:    Cristian Collins today has, No headache, No chest pain, No abdominal pain - No Nausea, No new weakness tingling or numbness, No Cough - ++ Improved SOB.     Assessment  & Plan :     1. Acute on chronic COPD exacerbation - improved, he is already on 2 L nasal cannula oxygen and has advanced emphysema predominant COPD at baseline, has been placed on IV steroids, empiric Levaquin, oxygen as needed to keep pulse ox over 90% and nebulizer treatments. He somewhat improved we'll continue to monitor with supportive care. Echo ordered & pending. No clinical signs of CHF, no clinical suspicion of DVT PE.  2. History of smoking. Counseled to quit.   3. ARF. Hold diuretics, nephrotoxins. Hydrated & resolved.   4. Essential hypertension. On Norvasc continue.   5. Dyslipidemia. On statin   6. History of CAD. No acute issues. Continue aspirin and statin for secondary prevention. Echogram pending.    Code Status : Full  Family Communication  : None  Disposition Plan  : Home 1-2 days if cleared by PT  Consults  :    Procedures  :    TTE   DVT Prophylaxis  :  Lovenox    Lab Results  Component Value Date   PLT 197 08/13/2015    Inpatient Medications  Scheduled Meds: . amLODipine  10 mg Oral Daily  . aspirin EC  81 mg Oral Daily  . azithromycin  250 mg Oral Daily  . budesonide-formoterol  2 puff Inhalation BID  . enoxaparin (LOVENOX) injection  30 mg Subcutaneous Q24H  .  feeding supplement (ENSURE ENLIVE)  237 mL Oral TID BM  . ipratropium-albuterol  3 mL Nebulization TID  . isosorbide mononitrate  60 mg Oral Daily  . methylPREDNISolone (SOLU-MEDROL) injection  60 mg Intravenous TID  . pantoprazole  40 mg Oral Daily  . simvastatin  20 mg Oral Daily  . tiotropium  18 mcg Inhalation Daily   Continuous Infusions: . sodium chloride     PRN Meds:.acetaminophen, ipratropium-albuterol, nitroGLYCERIN  Antibiotics  :     Anti-infectives    Start     Dose/Rate Route Frequency Ordered Stop   08/14/15 1000  azithromycin (ZITHROMAX) tablet 250 mg     250 mg Oral Daily 08/12/15 2224 08/18/15 0959   08/13/15 1000  azithromycin (ZITHROMAX) tablet 500 mg     500 mg Oral Daily 08/12/15 2224 08/13/15 2130   08/12/15 2200  levofloxacin (LEVAQUIN) IVPB 750 mg     750 mg 100 mL/hr over 90 Minutes Intravenous  Once 08/12/15 2154 08/12/15 2348        Objective:   Filed Vitals:   08/13/15 1448 08/13/15 1958 08/13/15 2109 08/14/15 0538  BP:   165/59 126/58  Pulse:  82 102 77  Temp:   97.9 F (36.6 C) 97.8 F (36.6 C)  TempSrc:   Oral Oral  Resp:  16 16 16   Height:      Weight:      SpO2: 99%  100% 100%    Wt Readings from Last 3 Encounters:  08/12/15 46.72 kg (103 lb)  10/24/14 45.088 kg (99 lb 6.4 oz)  10/14/14 44.906 kg (99 lb)     Intake/Output Summary (Last 24 hours) at 08/14/15 0908 Last data filed at 08/14/15 0636  Gross per 24 hour  Intake 2042.5 ml  Output   1575 ml  Net  467.5 ml     Physical Exam  Awake Alert, Oriented X 3, No new F.N deficits, Normal affect [place].AT,PERRAL Supple Neck,No JVD, No cervical lymphadenopathy appriciated.  Symmetrical Chest wall movement, Mod air movement bilaterally, few wheezes RRR,No Gallops,Rubs or new Murmurs, No Parasternal Heave +ve B.Sounds, Abd Soft, No tenderness, No organomegaly appriciated, No rebound - guarding or rigidity. No Cyanosis, Clubbing or edema, No new Rash or bruise     Data  Review:   Micro Results Recent Results (from the past 240 hour(s))  Culture, blood (routine x 2)     Status: None (Preliminary result)   Collection Time: 08/12/15  9:05 PM  Result Value Ref Range Status   Specimen Description BLOOD LEFT ANTECUBITAL  Final   Special Requests BOTTLES DRAWN AEROBIC AND ANAEROBIC 5CC  Final   Culture NO GROWTH < 24 HOURS  Final   Report Status PENDING  Incomplete  Culture, blood (routine x 2)     Status: None (Preliminary result)   Collection Time: 08/12/15 10:30 PM  Result Value Ref Range Status   Specimen Description BLOOD RIGHT HAND  Final   Special Requests BOTTLES DRAWN AEROBIC AND ANAEROBIC 5CC  Final   Culture NO GROWTH < 24 HOURS  Final   Report Status PENDING  Incomplete    Radiology Reports Dg Chest Portable 1 View  08/12/2015  CLINICAL DATA:  82 year old male with acute shortness of breath and right chest pain. EXAM: PORTABLE CHEST 1 VIEW COMPARISON:  10/11/2014 and prior exams FINDINGS: The cardiomediastinal silhouette is unremarkable. COPD/ emphysema again identified. There is no evidence of focal airspace disease, pulmonary edema, suspicious pulmonary nodule/mass, pleural effusion, or pneumothorax. No acute bony abnormalities are identified. IMPRESSION: No active disease. Electronically Signed   By: Margarette Canada M.D.   On: 08/12/2015 21:39     CBC  Recent Labs Lab 08/12/15 2105 08/13/15 0410  WBC 11.5* 10.3  HGB 7.5* 7.2*  HCT 23.6* 22.4*  PLT 225 197  MCV 78.7 77.8*  MCH 25.0* 25.0*  MCHC 31.8 32.1  RDW 16.9* 16.5*  LYMPHSABS 0.8  --   MONOABS 1.7*  --   EOSABS 0.0  --   BASOSABS 0.0  --     Chemistries   Recent Labs Lab 08/12/15 2213 08/13/15 0410 08/14/15 0415  NA 139 136 139  K 3.9 4.2 4.5  CL 105 100* 107  CO2 25 25 24   GLUCOSE 105* 242* 151*  BUN 16 18 26*  CREATININE 1.45* 1.58* 1.21  CALCIUM 8.1* 8.4* 8.2*  AST 21 27  --   ALT 11* 13*  --   ALKPHOS 83 80  --   BILITOT 0.4 0.4  --     ------------------------------------------------------------------------------------------------------------------ estimated creatinine clearance is 30.6 mL/min (by C-G formula based on Cr of 1.21). ------------------------------------------------------------------------------------------------------------------ No results for input(s): HGBA1C in the last 72 hours. ------------------------------------------------------------------------------------------------------------------ No results for input(s): CHOL, HDL, LDLCALC, TRIG, CHOLHDL, LDLDIRECT in the last 72  hours. ------------------------------------------------------------------------------------------------------------------ No results for input(s): TSH, T4TOTAL, T3FREE, THYROIDAB in the last 72 hours.  Invalid input(s): FREET3 ------------------------------------------------------------------------------------------------------------------  Recent Labs  08/13/15 0940  VITAMINB12 314  FOLATE 20.3  FERRITIN 10*  TIBC 410  IRON 15*  RETICCTPCT 1.3    Coagulation profile No results for input(s): INR, PROTIME in the last 168 hours.  No results for input(s): DDIMER in the last 72 hours.  Cardiac Enzymes No results for input(s): CKMB, TROPONINI, MYOGLOBIN in the last 168 hours.  Invalid input(s): CK ------------------------------------------------------------------------------------------------------------------ Invalid input(s): POCBNP   Time Spent in minutes   35   SINGH,PRASHANT K M.D on 08/14/2015 at 9:08 AM  Between 7am to 7pm - Pager - [phone]  After 7pm go to [email or web address] - password [facility]  [facility] -  Office  [phone]

## 2015-08-14 NOTE — Progress Notes (Signed)
  Echocardiogram 2D Echocardiogram has been performed.  Tresa Res 08/14/2015, 10:48 AM

## 2015-08-15 LAB — MAGNESIUM: Magnesium: 2.5 mg/dL — ABNORMAL HIGH (ref 1.7–2.4)

## 2015-08-15 LAB — URINE CULTURE: Culture: >=100000

## 2015-08-15 MED ORDER — METHYLPREDNISOLONE SODIUM SUCC 125 MG IJ SOLR
60.0000 mg | Freq: Every day | INTRAMUSCULAR | Status: DC
  Administered 2015-08-16: 60 mg via INTRAVENOUS
  Filled 2015-08-15: qty 2

## 2015-08-15 MED ORDER — SODIUM CHLORIDE 0.9 % IV SOLN
510.0000 mg | Freq: Once | INTRAVENOUS | Status: AC
  Administered 2015-08-15: 510 mg via INTRAVENOUS
  Filled 2015-08-15: qty 17

## 2015-08-15 MED ORDER — LEVOFLOXACIN 750 MG PO TABS
750.0000 mg | ORAL_TABLET | Freq: Once | ORAL | Status: AC
  Administered 2015-08-15: 750 mg via ORAL
  Filled 2015-08-15: qty 1

## 2015-08-15 MED ORDER — LEVOFLOXACIN 500 MG PO TABS: 500.0000 mg | ORAL_TABLET | ORAL | Status: DC

## 2015-08-15 MED ORDER — MAGNESIUM SULFATE IN D5W 10-5 MG/ML-% IV SOLN
1.0000 g | Freq: Once | INTRAVENOUS | Status: AC
  Administered 2015-08-15: 1 g via INTRAVENOUS
  Filled 2015-08-15: qty 100

## 2015-08-15 MED ORDER — BISOPROLOL FUMARATE 5 MG PO TABS
5.0000 mg | ORAL_TABLET | Freq: Every day | ORAL | Status: DC
Start: 2015-08-15 — End: 2015-08-16
  Administered 2015-08-15 – 2015-08-16 (×2): 5 mg via ORAL
  Filled 2015-08-15 (×4): qty 1

## 2015-08-15 NOTE — Progress Notes (Signed)
Pt had 5 beat run of V tach. Pt complaining of chest pain. MD made aware, orders given. Continuing monitoring.

## 2015-08-15 NOTE — Care Management Important Message (Signed)
Important Message  Patient Details  Name: Cristian Collins MRN: [id number] Date of Birth: Oct 02, 1929   Medicare Important Message Given:  Yes    Mark Benecke Abena 08/15/2015, 11:28 AM

## 2015-08-15 NOTE — Progress Notes (Signed)
Patient Demographics:    Cristian Collins, is a 82 y.o. male, DOB - [birth year], [id number]  Admit date - 08/12/2015   Admitting Physician Gennaro Africa, MD  Outpatient Primary MD for the patient is [name], MD  LOS - 3  Summary  This is a pleasant 82 year old African-American male who lives at home has history of advanced COPD on 2 L nasal cannula oxygen at all times, CAD, essential hypertension, dyslipidemia, chronic grade 2 diastolic dysfunction with EF 65%. He was admitted 2 days ago to the hospital with acute on chronic hypoxic respiratory failure due to COPD exacerbation. He was placed on IV steroids along with standard COPD treatment with improvement, his urine cultures also suggest UTI. He has been appropriately treated. He has shown good improvement, appears quite frail, seen by PT qualifies for home health PT. Likely to be discharged in the next 1-2 days. Note early in the morning of 08/15/2015 he had an asymptomatic 5 beat run of V. tach. EF is preserved at 65% on recent echo. Will get 12-lead EKG, give him IV magnesium, place him on low-dose bisoprolol. Monitor electrolytes.   Chief Complaint  Patient presents with  . Shortness of Breath        Subjective:    82 [name] today has, No headache, No chest pain, No abdominal pain - No Nausea, No new weakness tingling or numbness, No Cough - ++ Improved SOB.     Assessment  & Plan :     1. Acute on chronic COPD exacerbation - improved, he is already on 2 L nasal cannula oxygen and has advanced emphysema predominant COPD at baseline, has been placed on IV steroids, we'll start to taper, which from azithromycin to Levaquin on 08/15/2015 based on his urine cultures, oxygen as needed to keep pulse ox over 90% and nebulizer treatments. Note  he is on 2 L nasal cannula oxygen at all times at home. His breathing has improved and he feels much better. Echo unremarkable. No clinical signs of CHF, no clinical suspicion of DVT PE.   2. History of smoking. Counseled to quit.   3. ARF. Hold diuretics, nephrotoxins. Hydrated & resolved.   4. Essential hypertension. On Norvasc added bisoprolol due to few beats of V. tach on 08/15/2015.   5. Dyslipidemia. On statin   6. History of CAD. No acute issues. Continue aspirin and statin for secondary prevention. Stable Echo Ef 65%.   7. Symptomatic 5 beat run of V. tach on 08/15/2015 around 10:50 AM. EF is 65%, will give him 1 g of IV mag, check electrolytes, place him on bisoprolol. Echo done yesterday appears stable with a stable EF and without any wall motion abnormality. We will check a 12-lead EKG as well.   8. Grade 2 diastolic dysfunction. EF 65%. He is clinically compensated.   9. UTI. Culture noted. Switch from azithromycin to Levaquin on 08/15/2015.    Code Status : Full  Family Communication  : None  Disposition Plan  : Home 1-2 days if cleared by PT  Consults  :    Procedures  :    TTE  Normal LV wall thickness with LVEF 65-70% and grade 2 diastolicdysfunction. Mild left atrial enlargement. MIldly calcifiedmitral leaflets. Sclerotic aortic valve  without stenosis. Upper normal right atrial size. Trivial tricuspid regurgitation.   DVT Prophylaxis  :  Lovenox    Lab Results  Component Value Date   PLT 197 08/13/2015    Inpatient Medications  Scheduled Meds: . amLODipine  10 mg Oral Daily  . aspirin EC  81 mg Oral Daily  . budesonide-formoterol  2 puff Inhalation BID  . enoxaparin (LOVENOX) injection  30 mg Subcutaneous Q24H  . feeding supplement (ENSURE ENLIVE)  237 mL Oral TID BM  . ipratropium-albuterol  3 mL Nebulization TID  . isosorbide mononitrate  60 mg Oral Daily  . methylPREDNISolone (SOLU-MEDROL) injection  60 mg Intravenous TID  .  pantoprazole  40 mg Oral Daily  . simvastatin  20 mg Oral Daily  . tiotropium  18 mcg Inhalation Daily   Continuous Infusions:   PRN Meds:.acetaminophen, ipratropium-albuterol, nitroGLYCERIN  Antibiotics  :     Anti-infectives    Start     Dose/Rate Route Frequency Ordered Stop   08/14/15 1000  azithromycin (ZITHROMAX) tablet 250 mg  Status:  Discontinued     250 mg Oral Daily 08/12/15 2224 08/15/15 1106   08/13/15 1000  azithromycin (ZITHROMAX) tablet 500 mg     500 mg Oral Daily 08/12/15 2224 08/13/15 2130   08/12/15 2200  levofloxacin (LEVAQUIN) IVPB 750 mg     750 mg 100 mL/hr over 90 Minutes Intravenous  Once 08/12/15 2154 08/12/15 2348        Objective:   Filed Vitals:   08/14/15 2158 08/14/15 2216 08/15/15 0507 08/15/15 0859  BP:  133/53 135/56   Pulse:  89 72   Temp:  97.5 F (36.4 C) 97.8 F (36.6 C)   TempSrc:  Oral Oral   Resp:  18 18   Height:      Weight:      SpO2: 99% 100% 100% 98%    Wt Readings from Last 3 Encounters:  08/12/15 46.72 kg (103 lb)  10/24/14 45.088 kg (99 lb 6.4 oz)  10/14/14 44.906 kg (99 lb)     Intake/Output Summary (Last 24 hours) at 08/15/15 1106 Last data filed at 08/14/15 2215  Gross per 24 hour  Intake    237 ml  Output    775 ml  Net   -538 ml     Physical Exam  Awake Alert, Oriented X 3, No new F.N deficits, Normal affect [place].AT,PERRAL Supple Neck,No JVD, No cervical lymphadenopathy appriciated.  Symmetrical Chest wall movement, Mod air movement bilaterally, few wheezes RRR,No Gallops,Rubs or new Murmurs, No Parasternal Heave +ve B.Sounds, Abd Soft, No tenderness, No organomegaly appriciated, No rebound - guarding or rigidity. No Cyanosis, Clubbing or edema, No new Rash or bruise     Data Review:   Micro Results Recent Results (from the past 240 hour(s))  Culture, blood (routine x 2)     Status: None (Preliminary result)   Collection Time: 08/12/15  9:05 PM  Result Value Ref Range Status   Specimen  Description BLOOD LEFT ANTECUBITAL  Final   Special Requests BOTTLES DRAWN AEROBIC AND ANAEROBIC 5CC  Final   Culture NO GROWTH 2 DAYS  Final   Report Status PENDING  Incomplete  Culture, blood (routine x 2)     Status: None (Preliminary result)   Collection Time: 08/12/15 10:30 PM  Result Value Ref Range Status   Specimen Description BLOOD RIGHT HAND  Final   Special Requests BOTTLES DRAWN AEROBIC AND ANAEROBIC 5CC  Final   Culture NO GROWTH  2 DAYS  Final   Report Status PENDING  Incomplete  Urine culture     Status: None   Collection Time: 08/12/15 11:12 PM  Result Value Ref Range Status   Specimen Description URINE, RANDOM  Final   Special Requests NONE  Final   Culture >=100,000 COLONIES/mL KLEBSIELLA PNEUMONIAE  Final   Report Status 08/15/2015 FINAL  Final   Organism ID, Bacteria KLEBSIELLA PNEUMONIAE  Final      Susceptibility   Klebsiella pneumoniae - MIC*    AMPICILLIN >=32 RESISTANT Resistant     CEFAZOLIN <=4 SENSITIVE Sensitive     CEFTRIAXONE <=1 SENSITIVE Sensitive     CIPROFLOXACIN <=0.25 SENSITIVE Sensitive     GENTAMICIN <=1 SENSITIVE Sensitive     IMIPENEM <=0.25 SENSITIVE Sensitive     NITROFURANTOIN 64 INTERMEDIATE Intermediate     TRIMETH/SULFA <=20 SENSITIVE Sensitive     AMPICILLIN/SULBACTAM 4 SENSITIVE Sensitive     PIP/TAZO <=4 SENSITIVE Sensitive     * >=100,000 COLONIES/mL KLEBSIELLA PNEUMONIAE    Radiology Reports Dg Chest Portable 1 View  08/12/2015  CLINICAL DATA:  82 year old male with acute shortness of breath and right chest pain. EXAM: PORTABLE CHEST 1 VIEW COMPARISON:  10/11/2014 and prior exams FINDINGS: The cardiomediastinal silhouette is unremarkable. COPD/ emphysema again identified. There is no evidence of focal airspace disease, pulmonary edema, suspicious pulmonary nodule/mass, pleural effusion, or pneumothorax. No acute bony abnormalities are identified. IMPRESSION: No active disease. Electronically Signed   By: Margarette Canada M.D.   On:  08/12/2015 21:39     CBC  Recent Labs Lab 08/12/15 2105 08/13/15 0410  WBC 11.5* 10.3  HGB 7.5* 7.2*  HCT 23.6* 22.4*  PLT 225 197  MCV 78.7 77.8*  MCH 25.0* 25.0*  MCHC 31.8 32.1  RDW 16.9* 16.5*  LYMPHSABS 0.8  --   MONOABS 1.7*  --   EOSABS 0.0  --   BASOSABS 0.0  --     Chemistries   Recent Labs Lab 08/12/15 2213 08/13/15 0410 08/14/15 0415  NA 139 136 139  K 3.9 4.2 4.5  CL 105 100* 107  CO2 25 25 24   GLUCOSE 105* 242* 151*  BUN 16 18 26*  CREATININE 1.45* 1.58* 1.21  CALCIUM 8.1* 8.4* 8.2*  AST 21 27  --   ALT 11* 13*  --   ALKPHOS 83 80  --   BILITOT 0.4 0.4  --    ------------------------------------------------------------------------------------------------------------------ estimated creatinine clearance is 30.6 mL/min (by C-G formula based on Cr of 1.21). ------------------------------------------------------------------------------------------------------------------ No results for input(s): HGBA1C in the last 72 hours. ------------------------------------------------------------------------------------------------------------------ No results for input(s): CHOL, HDL, LDLCALC, TRIG, CHOLHDL, LDLDIRECT in the last 72 hours. ------------------------------------------------------------------------------------------------------------------ No results for input(s): TSH, T4TOTAL, T3FREE, THYROIDAB in the last 72 hours.  Invalid input(s): FREET3 ------------------------------------------------------------------------------------------------------------------  Recent Labs  08/13/15 0940  VITAMINB12 314  FOLATE 20.3  FERRITIN 10*  TIBC 410  IRON 15*  RETICCTPCT 1.3    Coagulation profile No results for input(s): INR, PROTIME in the last 168 hours.  No results for input(s): DDIMER in the last 72 hours.  Cardiac Enzymes No results for input(s): CKMB, TROPONINI, MYOGLOBIN in the last 168 hours.  Invalid input(s):  CK ------------------------------------------------------------------------------------------------------------------ Invalid input(s): POCBNP   Time Spent in minutes   35   SINGH,PRASHANT K M.D on 08/15/2015 at 11:06 AM  Between 7am to 7pm - Pager - [phone]  After 7pm go to [email or web address] - password [facility]  [facility] -  Office  [phone]

## 2015-08-15 NOTE — Progress Notes (Signed)
Physical Therapy Treatment Patient Details Name: Cristian Collins MRN: [id number] DOB: [birth year] Today's Date: 09-08-2015    History of Present Illness Pt adm with acute on chronic COPD. PMH - chf, htn, pvd, cad    PT Comments    Pt making good progress. Pt steadier with mobility.  Follow Up Recommendations  Home health PT;Supervision - Intermittent     Equipment Recommendations  None recommended by PT    Recommendations for Other Services       Precautions / Restrictions Precautions Precautions: Fall    Mobility  Bed Mobility Overal bed mobility: Modified Independent             General bed mobility comments: Incr time   Transfers Overall transfer level: Needs assistance Equipment used: Rolling walker (2 wheeled) Transfers: Sit to/from Stand Sit to Stand: Supervision         General transfer comment: for safety  Ambulation/Gait Ambulation/Gait assistance: Supervision Ambulation Distance (Feet): 125 Feet Assistive device: Rolling walker (2 wheeled) Gait Pattern/deviations: Step-through pattern;Decreased stride length   Gait velocity interpretation: <1.8 ft/sec, indicative of risk for recurrent falls General Gait Details: Amb on 3L of O2 with dyspnea 2-3/4 and SpO2 100% after amb.  No tremulousness.   Stairs            Wheelchair Mobility    Modified Rankin (Stroke Patients Only)       Balance     Sitting balance-Leahy Scale: Good       Standing balance-Leahy Scale: Poor Standing balance comment: support of walker and supervision                    Cognition Arousal/Alertness: Awake/alert Behavior During Therapy: WFL for tasks assessed/performed Overall Cognitive Status: Within Functional Limits for tasks assessed                      Exercises      General Comments        Pertinent Vitals/Pain Pain Assessment: No/denies pain    Home Living                      Prior Function            PT  Goals (current goals can now be found in the care plan section) Progress towards PT goals: Progressing toward goals    Frequency  Min 3X/week    PT Plan Current plan remains appropriate    Co-evaluation             End of Session Equipment Utilized During Treatment: Gait belt;Oxygen Activity Tolerance: Patient tolerated treatment well Patient left: in chair;with call bell/phone within reach     Time: [id number] PT Time Calculation (min) (ACUTE ONLY): 20 min  Charges:  $Gait Training: 8-22 mins                    G Codes:      Smriti Barkow 08-Sep-2015, 5:00 PM [employer] PT [phone]

## 2015-08-15 NOTE — Care Management Note (Addendum)
Case Management Note  Patient Details  Name: SOPHIA BRAFORD MRN: [id number] Date of Birth: [birth year]  Subjective/Objective:   Patient is for possible dc tomorrow, will need HHPT and HHRN, Patient chose [facility], referral given to [place] with [facility].  Soc will begin 24-48 hrs post dc.  Patient states he will have a family member bring his oxygen tank to the hospital at dc.  Patient also has [facility] Telephonic Case Management, where he weighs himself every day and talks to the CM over the phone.                 Action/Plan:   Expected Discharge Date:                  Expected Discharge Plan:  [place]  In-House Referral:     Discharge planning Services  CM Consult  Post Acute Care Choice:    Choice offered to:  Patient  DME Arranged:    DME Agency:     HH Arranged:  RN, PT [place] Agency:     Status of Service:  Completed, signed off  Medicare Important Message Given:  Yes Date Medicare IM Given:    Medicare IM give by:    Date Additional Medicare IM Given:    Additional Medicare Important Message give by:     If discussed at [place] of Stay Meetings, dates discussed:    Additional Comments:  Zenon Mayo, RN 08/15/2015, 2:30 PM
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
## 2015-08-16 DIAGNOSIS — J441 Chronic obstructive pulmonary disease with (acute) exacerbation: Secondary | ICD-10-CM

## 2015-08-16 LAB — BASIC METABOLIC PANEL
ANION GAP: 5 (ref 5–15)
BUN: 24 mg/dL — ABNORMAL HIGH (ref 6–20)
CALCIUM: 8.5 mg/dL — AB (ref 8.9–10.3)
CO2: 28 mmol/L (ref 22–32)
Chloride: 104 mmol/L (ref 101–111)
Creatinine, Ser: 1.01 mg/dL (ref 0.61–1.24)
Glucose, Bld: 158 mg/dL — ABNORMAL HIGH (ref 65–99)
POTASSIUM: 4.7 mmol/L (ref 3.5–5.1)
SODIUM: 137 mmol/L (ref 135–145)

## 2015-08-16 MED ORDER — ALBUTEROL SULFATE HFA 108 (90 BASE) MCG/ACT IN AERS: 1.0000 | INHALATION_SPRAY | RESPIRATORY_TRACT | Status: DC | PRN

## 2015-08-16 MED ORDER — PANTOPRAZOLE SODIUM 40 MG PO TBEC: 40.0000 mg | DELAYED_RELEASE_TABLET | Freq: Every day | ORAL | Status: AC

## 2015-08-16 MED ORDER — IPRATROPIUM BROMIDE 0.02 % IN SOLN: 0.5000 mg | Freq: Four times a day (QID) | RESPIRATORY_TRACT | Status: DC

## 2015-08-16 MED ORDER — ALBUTEROL SULFATE (2.5 MG/3ML) 0.083% IN NEBU
2.5000 mg | INHALATION_SOLUTION | Freq: Four times a day (QID) | RESPIRATORY_TRACT | Status: DC
  Filled 2015-08-16: qty 3

## 2015-08-16 MED ORDER — PREDNISONE 10 MG (21) PO TBPK: 10.0000 mg | ORAL_TABLET | Freq: Every day | ORAL | Status: DC

## 2015-08-16 MED ORDER — LEVOFLOXACIN 500 MG PO TABS: 500.0000 mg | ORAL_TABLET | ORAL | Status: DC

## 2015-08-16 MED ORDER — IPRATROPIUM BROMIDE 0.02 % IN SOLN
0.5000 mg | Freq: Four times a day (QID) | RESPIRATORY_TRACT | Status: DC
  Filled 2015-08-16: qty 2.5

## 2015-08-16 NOTE — Progress Notes (Signed)
Fraser Din to be D/C'd Home per MD order.  Discussed with the patient and all questions fully answered.  VSS, Skin clean, dry and intact without evidence of skin break down, no evidence of skin tears noted. IV catheter discontinued intact. Site without signs and symptoms of complications. Dressing and pressure applied.  An After Visit Summary was printed and given to the patient. Patient received prescription.  D/c education completed with patient/family including follow up instructions, medication list, d/c activities limitations if indicated, with other d/c instructions as indicated by MD - patient able to verbalize understanding, all questions fully answered.   Patient instructed to return to ED, call 911, or call MD for any changes in condition.   Patient escorted via [place], and D/C home via private auto.  Malcolm Metro 08/16/2015 1:58 PM

## 2015-08-16 NOTE — Care Management Note (Signed)
Case Management Note  Patient Details  Name: Cristian Collins MRN: [id number] Date of Birth: [birth year]  Subjective/Objective:   Patient is for dc today, [place] notified.  NCM gave patient a $10 co pay savings card for spiriva inhaler and a discount savings card up to 75% for symbicort.  Patient states his brother will bring his oxygen tank to hospital for him to go home with.                  Action/Plan:   Expected Discharge Date:                  Expected Discharge Plan:  [place]  In-House Referral:     Discharge planning Services  CM Consult  Post Acute Care Choice:    Choice offered to:  Patient  DME Arranged:    DME Agency:     HH Arranged:  RN, PT [place] Agency:     Status of Service:  Completed, signed off  Medicare Important Message Given:  Yes Date Medicare IM Given:    Medicare IM give by:    Date Additional Medicare IM Given:    Additional Medicare Important Message give by:     If discussed at [place] of Stay Meetings, dates discussed:    Additional Comments:  Zenon Mayo, RN 08/16/2015, 12:49 PM

## 2015-08-16 NOTE — Care Management Note (Signed)
Case Management Note  Patient Details  Name: Cristian Collins MRN: [id number] Date of Birth: 1929/12/15  Subjective/Objective:     Patient for dc today, Cristian Collins with [facility] notified.               Action/Plan:   Expected Discharge Date:                  Expected Discharge Plan:  [place]  In-House Referral:     Discharge planning Services  CM Consult  Post Acute Care Choice:    Choice offered to:  Patient  DME Arranged:    DME Agency:     HH Arranged:  RN, PT [place] Agency:     Status of Service:  Completed, signed off  Medicare Important Message Given:  Yes Date Medicare IM Given:    Medicare IM give by:    Date Additional Medicare IM Given:    Additional Medicare Important Message give by:     If discussed at [place] of Stay Meetings, dates discussed:    Additional Comments:  Cristian Mayo, RN 08/16/2015, 12:16 PM

## 2015-08-17 DIAGNOSIS — I739 Peripheral vascular disease, unspecified: Secondary | ICD-10-CM | POA: Diagnosis not present

## 2015-08-17 DIAGNOSIS — Z792 Long term (current) use of antibiotics: Secondary | ICD-10-CM | POA: Diagnosis not present

## 2015-08-17 DIAGNOSIS — J441 Chronic obstructive pulmonary disease with (acute) exacerbation: Secondary | ICD-10-CM | POA: Diagnosis not present

## 2015-08-17 DIAGNOSIS — D649 Anemia, unspecified: Secondary | ICD-10-CM | POA: Diagnosis not present

## 2015-08-17 DIAGNOSIS — N390 Urinary tract infection, site not specified: Secondary | ICD-10-CM | POA: Diagnosis not present

## 2015-08-17 DIAGNOSIS — Z8719 Personal history of other diseases of the digestive system: Secondary | ICD-10-CM | POA: Diagnosis not present

## 2015-08-17 DIAGNOSIS — I110 Hypertensive heart disease with heart failure: Secondary | ICD-10-CM | POA: Diagnosis not present

## 2015-08-17 DIAGNOSIS — I509 Heart failure, unspecified: Secondary | ICD-10-CM | POA: Diagnosis not present

## 2015-08-17 DIAGNOSIS — I2510 Atherosclerotic heart disease of native coronary artery without angina pectoris: Secondary | ICD-10-CM | POA: Diagnosis not present

## 2015-08-17 LAB — CULTURE, BLOOD (ROUTINE X 2)
CULTURE: NO GROWTH
Culture: NO GROWTH

## 2015-08-17 NOTE — Discharge Summary (Signed)
Physician Discharge Summary  Cristian Collins [id number] DOB: 15-Oct-1929 DOA: 08/12/2015  PCP: [name], MD  Admit date: 08/12/2015 Discharge date: 08/16/2015  Time spent: 25 minutes  Recommendations for Outpatient Follow-up:  Follow up with PCP in one week.  Discharge Diagnoses:  Principal Problem:   Acute respiratory failure with hypoxia ([place]) Active Problems:   COPD exacerbation (H[facility])   Essential hypertension   AKI (acute kidney injury) ([place])   Anemia, iron deficiency   Discharge Condition: improved  Diet recommendation: low sodium diet.   Filed Weights   08/12/15 2100  Weight: 46.72 kg (103 lb)    History of present illness:  This is a pleasant 82 year old African-American male who lives at home has history of advanced COPD on 2 L nasal cannula oxygen at all times, CAD, essential hypertension, dyslipidemia, chronic grade 2 diastolic dysfunction with EF 65%. He was admitted 2 days ago to the hospital with acute on chronic hypoxic respiratory failure due to COPD exacerbation. He was placed on IV steroids along with standard COPD treatment with improvement, his urine cultures also suggest UTI. He has been appropriately treated. He has shown good improvement, appears quite frail, seen by PT qualifies for home health PT.   Hospital Course:   1. Acute on chronic COPD exacerbation - improved, he is already on 2 L nasal cannula oxygen and has advanced emphysema predominant COPD at baseline, has been placed on IV steroids, we'll start to taper, which from azithromycin to Levaquin on 08/15/2015 based on his urine cultures, oxygen as needed to keep pulse ox over 90% and nebulizer treatments. Note he is on 2 L nasal cannula oxygen at all times at home. His breathing has improved and he feels much better. Echo unremarkable. No clinical signs of CHF, no clinical suspicion of DVT PE.   2. History of smoking. Counseled to quit.   3. ARF. Hold diuretics, nephrotoxins. Hydrated &  resolved.   4. Essential hypertension.well controlled.   5. Dyslipidemia. On statin   6. History of CAD. No acute issues. Continue aspirin and statin for secondary prevention. Stable Echo Ef 65%.   7. Symptomatic 5 beat run of V. tach on 08/15/2015 around 10:50 AM. EF is 65%,Echo done yesterday appears stable with a stable EF and without any wall motion abnormality.   8. Grade 2 diastolic dysfunction. EF 65%. He is clinically compensated.   9. UTI. Culture noted. Continue to complete the course of the antibiotic.   Procedures:  none  Consultations:  none  Discharge Exam: Filed Vitals:   08/15/15 2140 08/16/15 0518  BP: 115/45 133/53  Pulse: 60 57  Temp: 97.6 F (36.4 C) 97.8 F (36.6 C)  Resp: 18 18    General: alert comfortable.  Cardiovascular: s1s2 Respiratory: ctab  Discharge Instructions   Discharge Instructions    Diet - low sodium heart healthy    Complete by:  As directed      Discharge instructions    Complete by:  As directed   Follow up with PCP in one week.          Discharge Medication List as of 08/16/2015  1:57 PM    START taking these medications   Details  ipratropium (ATROVENT) 0.02 % nebulizer solution Take 2.5 mLs (0.5 mg total) by nebulization every 6 (six) hours., Starting 08/16/2015, Until Discontinued, Print    levofloxacin (LEVAQUIN) 500 MG tablet Take 1 tablet (500 mg total) by mouth every other day., Starting 08/17/2015, Until Discontinued, Print  pantoprazole (PROTONIX) 40 MG tablet Take 1 tablet (40 mg total) by mouth daily., Starting 08/16/2015, Until Discontinued, Print    predniSONE (STERAPRED UNI-PAK 21 TAB) 10 MG (21) TBPK tablet Take 1 tablet (10 mg total) by mouth daily. Prednisone 40 mg daily for 3 days followed by  Prednisone 20 mg daily for 3 days., Starting 08/16/2015, Until Discontinued, Print      CONTINUE these medications which have NOT CHANGED   Details  albuterol (PROAIR HFA) 108 (90 BASE) MCG/ACT inhaler  Inhale 2 puffs into the lungs 4 (four) times daily. For shortness of breath, Until Discontinued, Historical Med    albuterol (PROVENTIL) (2.5 MG/3ML) 0.083% nebulizer solution Take 3 mLs (2.5 mg total) by nebulization every 4 (four) hours as needed for shortness of breath. For shortness of breath, Starting 12/06/2013, Until Discontinued, Print    amLODipine (NORVASC) 10 MG tablet Take 10 mg by mouth daily. , Until Discontinued, Historical Med    aspirin EC 81 MG tablet Take 81 mg by mouth daily., Until Discontinued, Historical Med    budesonide-formoterol (SYMBICORT) 160-4.5 MCG/ACT inhaler Inhale 2 puffs into the lungs 2 (two) times daily., Until Discontinued, Historical Med    feeding supplement, ENSURE COMPLETE, (ENSURE COMPLETE) LIQD Take 237 mLs by mouth 3 (three) times daily between meals., Starting 12/06/2013, Until Discontinued, Print    furosemide (LASIX) 20 MG tablet Take 20 mg by mouth 2 (two) times daily., Until Discontinued, Historical Med    isosorbide mononitrate (IMDUR) 60 MG 24 hr tablet Take 60 mg by mouth daily., Until Discontinued, Historical Med    nitroGLYCERIN (NITROSTAT) 0.4 MG SL tablet Place 0.4 mg under the tongue every 5 (five) minutes as needed for chest pain. For chest pain, Until Discontinued, Historical Med    simvastatin (ZOCOR) 20 MG tablet Take 20 mg by mouth daily. , Until Discontinued, Historical Med    spironolactone (ALDACTONE) 25 MG tablet Take 25 mg by mouth every other day., Starting 10/04/2014, Until Discontinued, Historical Med    tiotropium (SPIRIVA) 18 MCG inhalation capsule Place 18 mcg into inhaler and inhale daily. , Until Discontinued, Historical Med    eplerenone (INSPRA) 25 MG tablet Take 25 mg by mouth daily. Reported on 08/12/2015, Starting 09/22/2014, Until Discontinued, Historical Med      STOP taking these medications     ibuprofen (ADVIL,MOTRIN) 200 MG tablet      torsemide (DEMADEX) 20 MG tablet      guaiFENesin (MUCINEX) 600 MG  12 hr tablet        No Known Allergies Follow-up Information    Follow up with [facility].   Why:  HHRN, HHPT   Contact information:   [address] [place] [address] [phone]       Follow up with [name], MD. Schedule an appointment as soon as possible for a visit in 1 week.   Specialty:  Internal Medicine   Contact information:   [address] [place] [address] [phone]        The results of significant diagnostics from this hospitalization (including imaging, microbiology, ancillary and laboratory) are listed below for reference.    Significant Diagnostic Studies: Dg Chest Portable 1 View  08/12/2015  CLINICAL DATA:  82 year old male with acute shortness of breath and right chest pain. EXAM: PORTABLE CHEST 1 VIEW COMPARISON:  10/11/2014 and prior exams FINDINGS: The cardiomediastinal silhouette is unremarkable. COPD/ emphysema again identified. There is no evidence of focal airspace disease, pulmonary  edema, suspicious pulmonary nodule/mass, pleural effusion, or pneumothorax. No acute bony abnormalities are identified. IMPRESSION: No active disease. Electronically Signed   By: Margarette Canada M.D.   On: 08/12/2015 21:39    Microbiology: Recent Results (from the past 240 hour(s))  Culture, blood (routine x 2)     Status: None (Preliminary result)   Collection Time: 08/12/15  9:05 PM  Result Value Ref Range Status   Specimen Description BLOOD LEFT ANTECUBITAL  Final   Special Requests BOTTLES DRAWN AEROBIC AND ANAEROBIC 5CC  Final   Culture NO GROWTH 4 DAYS  Final   Report Status PENDING  Incomplete  Culture, blood (routine x 2)     Status: None (Preliminary result)   Collection Time: 08/12/15 10:30 PM  Result Value Ref Range Status   Specimen Description BLOOD RIGHT HAND  Final   Special Requests BOTTLES DRAWN AEROBIC AND ANAEROBIC 5CC  Final   Culture NO GROWTH 4 DAYS  Final   Report Status PENDING   Incomplete  Urine culture     Status: None   Collection Time: 08/12/15 11:12 PM  Result Value Ref Range Status   Specimen Description URINE, RANDOM  Final   Special Requests NONE  Final   Culture >=100,000 COLONIES/mL KLEBSIELLA PNEUMONIAE  Final   Report Status 08/15/2015 FINAL  Final   Organism ID, Bacteria KLEBSIELLA PNEUMONIAE  Final      Susceptibility   Klebsiella pneumoniae - MIC*    AMPICILLIN >=32 RESISTANT Resistant     CEFAZOLIN <=4 SENSITIVE Sensitive     CEFTRIAXONE <=1 SENSITIVE Sensitive     CIPROFLOXACIN <=0.25 SENSITIVE Sensitive     GENTAMICIN <=1 SENSITIVE Sensitive     IMIPENEM <=0.25 SENSITIVE Sensitive     NITROFURANTOIN 64 INTERMEDIATE Intermediate     TRIMETH/SULFA <=20 SENSITIVE Sensitive     AMPICILLIN/SULBACTAM 4 SENSITIVE Sensitive     PIP/TAZO <=4 SENSITIVE Sensitive     * >=100,000 COLONIES/mL KLEBSIELLA PNEUMONIAE     Labs: Basic Metabolic Panel:  Recent Labs Lab 08/12/15 2213 08/13/15 0410 08/14/15 0415 08/15/15 1244 08/16/15 0510  NA 139 136 139  --  137  K 3.9 4.2 4.5  --  4.7  CL 105 100* 107  --  104  CO2 25 25 24   --  28  GLUCOSE 105* 242* 151*  --  158*  BUN 16 18 26*  --  24*  CREATININE 1.45* 1.58* 1.21  --  1.01  CALCIUM 8.1* 8.4* 8.2*  --  8.5*  MG  --   --   --  2.5*  --    Liver Function Tests:  Recent Labs Lab 08/12/15 2213 08/13/15 0410  AST 21 27  ALT 11* 13*  ALKPHOS 83 80  BILITOT 0.4 0.4  PROT 5.0* 5.7*  ALBUMIN 2.7* 2.7*   No results for input(s): LIPASE, AMYLASE in the last 168 hours. No results for input(s): AMMONIA in the last 168 hours. CBC:  Recent Labs Lab 08/12/15 2105 08/13/15 0410  WBC 11.5* 10.3  NEUTROABS 9.0*  --   HGB 7.5* 7.2*  HCT 23.6* 22.4*  MCV 78.7 77.8*  PLT 225 197   Cardiac Enzymes: No results for input(s): CKTOTAL, CKMB, CKMBINDEX, TROPONINI in the last 168 hours. BNP: BNP (last 3 results)  Recent Labs  10/07/14 1538 10/11/14 1201 08/12/15 2105  BNP 72.0 124.6*  96.6    ProBNP (last 3 results) No results for input(s): PROBNP in the last 8760 hours.  CBG: No results for  input(s): GLUCAP in the last 168 hours.     SignedHosie Poisson MD  FACP  [facility] 08/17/2015, 7:53 AM

## 2015-08-18 DIAGNOSIS — I509 Heart failure, unspecified: Secondary | ICD-10-CM | POA: Diagnosis not present

## 2015-08-18 DIAGNOSIS — Z8719 Personal history of other diseases of the digestive system: Secondary | ICD-10-CM | POA: Diagnosis not present

## 2015-08-18 DIAGNOSIS — N390 Urinary tract infection, site not specified: Secondary | ICD-10-CM | POA: Diagnosis not present

## 2015-08-18 DIAGNOSIS — J441 Chronic obstructive pulmonary disease with (acute) exacerbation: Secondary | ICD-10-CM | POA: Diagnosis not present

## 2015-08-18 DIAGNOSIS — D649 Anemia, unspecified: Secondary | ICD-10-CM | POA: Diagnosis not present

## 2015-08-18 DIAGNOSIS — I110 Hypertensive heart disease with heart failure: Secondary | ICD-10-CM | POA: Diagnosis not present

## 2015-08-18 DIAGNOSIS — I739 Peripheral vascular disease, unspecified: Secondary | ICD-10-CM | POA: Diagnosis not present

## 2015-08-18 DIAGNOSIS — Z792 Long term (current) use of antibiotics: Secondary | ICD-10-CM | POA: Diagnosis not present

## 2015-08-18 DIAGNOSIS — I2510 Atherosclerotic heart disease of native coronary artery without angina pectoris: Secondary | ICD-10-CM | POA: Diagnosis not present

## 2015-08-21 DIAGNOSIS — Z8719 Personal history of other diseases of the digestive system: Secondary | ICD-10-CM | POA: Diagnosis not present

## 2015-08-21 DIAGNOSIS — I739 Peripheral vascular disease, unspecified: Secondary | ICD-10-CM | POA: Diagnosis not present

## 2015-08-21 DIAGNOSIS — J441 Chronic obstructive pulmonary disease with (acute) exacerbation: Secondary | ICD-10-CM | POA: Diagnosis not present

## 2015-08-21 DIAGNOSIS — Z792 Long term (current) use of antibiotics: Secondary | ICD-10-CM | POA: Diagnosis not present

## 2015-08-21 DIAGNOSIS — I509 Heart failure, unspecified: Secondary | ICD-10-CM | POA: Diagnosis not present

## 2015-08-21 DIAGNOSIS — I110 Hypertensive heart disease with heart failure: Secondary | ICD-10-CM | POA: Diagnosis not present

## 2015-08-21 DIAGNOSIS — N390 Urinary tract infection, site not specified: Secondary | ICD-10-CM | POA: Diagnosis not present

## 2015-08-21 DIAGNOSIS — I2510 Atherosclerotic heart disease of native coronary artery without angina pectoris: Secondary | ICD-10-CM | POA: Diagnosis not present

## 2015-08-21 DIAGNOSIS — D649 Anemia, unspecified: Secondary | ICD-10-CM | POA: Diagnosis not present

## 2015-08-23 DIAGNOSIS — D649 Anemia, unspecified: Secondary | ICD-10-CM | POA: Diagnosis not present

## 2015-08-23 DIAGNOSIS — I2510 Atherosclerotic heart disease of native coronary artery without angina pectoris: Secondary | ICD-10-CM | POA: Diagnosis not present

## 2015-08-23 DIAGNOSIS — N390 Urinary tract infection, site not specified: Secondary | ICD-10-CM | POA: Diagnosis not present

## 2015-08-23 DIAGNOSIS — J441 Chronic obstructive pulmonary disease with (acute) exacerbation: Secondary | ICD-10-CM | POA: Diagnosis not present

## 2015-08-23 DIAGNOSIS — I509 Heart failure, unspecified: Secondary | ICD-10-CM | POA: Diagnosis not present

## 2015-08-23 DIAGNOSIS — I110 Hypertensive heart disease with heart failure: Secondary | ICD-10-CM | POA: Diagnosis not present

## 2015-08-23 DIAGNOSIS — Z8719 Personal history of other diseases of the digestive system: Secondary | ICD-10-CM | POA: Diagnosis not present

## 2015-08-23 DIAGNOSIS — Z792 Long term (current) use of antibiotics: Secondary | ICD-10-CM | POA: Diagnosis not present

## 2015-08-23 DIAGNOSIS — I739 Peripheral vascular disease, unspecified: Secondary | ICD-10-CM | POA: Diagnosis not present

## 2015-08-24 DIAGNOSIS — I509 Heart failure, unspecified: Secondary | ICD-10-CM | POA: Diagnosis not present

## 2015-08-24 DIAGNOSIS — N390 Urinary tract infection, site not specified: Secondary | ICD-10-CM | POA: Diagnosis not present

## 2015-08-24 DIAGNOSIS — I110 Hypertensive heart disease with heart failure: Secondary | ICD-10-CM | POA: Diagnosis not present

## 2015-08-24 DIAGNOSIS — D649 Anemia, unspecified: Secondary | ICD-10-CM | POA: Diagnosis not present

## 2015-08-24 DIAGNOSIS — Z8719 Personal history of other diseases of the digestive system: Secondary | ICD-10-CM | POA: Diagnosis not present

## 2015-08-24 DIAGNOSIS — I2510 Atherosclerotic heart disease of native coronary artery without angina pectoris: Secondary | ICD-10-CM | POA: Diagnosis not present

## 2015-08-24 DIAGNOSIS — J441 Chronic obstructive pulmonary disease with (acute) exacerbation: Secondary | ICD-10-CM | POA: Diagnosis not present

## 2015-08-24 DIAGNOSIS — I739 Peripheral vascular disease, unspecified: Secondary | ICD-10-CM | POA: Diagnosis not present

## 2015-08-24 DIAGNOSIS — Z792 Long term (current) use of antibiotics: Secondary | ICD-10-CM | POA: Diagnosis not present

## 2015-08-25 DIAGNOSIS — I110 Hypertensive heart disease with heart failure: Secondary | ICD-10-CM | POA: Diagnosis not present

## 2015-08-25 DIAGNOSIS — I2510 Atherosclerotic heart disease of native coronary artery without angina pectoris: Secondary | ICD-10-CM | POA: Diagnosis not present

## 2015-08-25 DIAGNOSIS — N390 Urinary tract infection, site not specified: Secondary | ICD-10-CM | POA: Diagnosis not present

## 2015-08-25 DIAGNOSIS — Z792 Long term (current) use of antibiotics: Secondary | ICD-10-CM | POA: Diagnosis not present

## 2015-08-25 DIAGNOSIS — I509 Heart failure, unspecified: Secondary | ICD-10-CM | POA: Diagnosis not present

## 2015-08-25 DIAGNOSIS — J441 Chronic obstructive pulmonary disease with (acute) exacerbation: Secondary | ICD-10-CM | POA: Diagnosis not present

## 2015-08-25 DIAGNOSIS — I739 Peripheral vascular disease, unspecified: Secondary | ICD-10-CM | POA: Diagnosis not present

## 2015-08-25 DIAGNOSIS — Z8719 Personal history of other diseases of the digestive system: Secondary | ICD-10-CM | POA: Diagnosis not present

## 2015-08-25 DIAGNOSIS — D649 Anemia, unspecified: Secondary | ICD-10-CM | POA: Diagnosis not present

## 2015-08-28 DIAGNOSIS — N390 Urinary tract infection, site not specified: Secondary | ICD-10-CM | POA: Diagnosis not present

## 2015-08-28 DIAGNOSIS — I2510 Atherosclerotic heart disease of native coronary artery without angina pectoris: Secondary | ICD-10-CM | POA: Diagnosis not present

## 2015-08-28 DIAGNOSIS — I739 Peripheral vascular disease, unspecified: Secondary | ICD-10-CM | POA: Diagnosis not present

## 2015-08-28 DIAGNOSIS — Z792 Long term (current) use of antibiotics: Secondary | ICD-10-CM | POA: Diagnosis not present

## 2015-08-28 DIAGNOSIS — Z8719 Personal history of other diseases of the digestive system: Secondary | ICD-10-CM | POA: Diagnosis not present

## 2015-08-28 DIAGNOSIS — I110 Hypertensive heart disease with heart failure: Secondary | ICD-10-CM | POA: Diagnosis not present

## 2015-08-28 DIAGNOSIS — D649 Anemia, unspecified: Secondary | ICD-10-CM | POA: Diagnosis not present

## 2015-08-28 DIAGNOSIS — J441 Chronic obstructive pulmonary disease with (acute) exacerbation: Secondary | ICD-10-CM | POA: Diagnosis not present

## 2015-08-28 DIAGNOSIS — I509 Heart failure, unspecified: Secondary | ICD-10-CM | POA: Diagnosis not present

## 2015-08-30 DIAGNOSIS — J441 Chronic obstructive pulmonary disease with (acute) exacerbation: Secondary | ICD-10-CM | POA: Diagnosis not present

## 2015-08-30 DIAGNOSIS — I739 Peripheral vascular disease, unspecified: Secondary | ICD-10-CM | POA: Diagnosis not present

## 2015-08-30 DIAGNOSIS — I509 Heart failure, unspecified: Secondary | ICD-10-CM | POA: Diagnosis not present

## 2015-08-30 DIAGNOSIS — I2510 Atherosclerotic heart disease of native coronary artery without angina pectoris: Secondary | ICD-10-CM | POA: Diagnosis not present

## 2015-08-30 DIAGNOSIS — Z8719 Personal history of other diseases of the digestive system: Secondary | ICD-10-CM | POA: Diagnosis not present

## 2015-08-30 DIAGNOSIS — D649 Anemia, unspecified: Secondary | ICD-10-CM | POA: Diagnosis not present

## 2015-08-30 DIAGNOSIS — N390 Urinary tract infection, site not specified: Secondary | ICD-10-CM | POA: Diagnosis not present

## 2015-08-30 DIAGNOSIS — Z792 Long term (current) use of antibiotics: Secondary | ICD-10-CM | POA: Diagnosis not present

## 2015-08-30 DIAGNOSIS — I110 Hypertensive heart disease with heart failure: Secondary | ICD-10-CM | POA: Diagnosis not present

## 2015-08-31 DIAGNOSIS — I2510 Atherosclerotic heart disease of native coronary artery without angina pectoris: Secondary | ICD-10-CM | POA: Diagnosis not present

## 2015-08-31 DIAGNOSIS — J441 Chronic obstructive pulmonary disease with (acute) exacerbation: Secondary | ICD-10-CM | POA: Diagnosis not present

## 2015-08-31 DIAGNOSIS — J449 Chronic obstructive pulmonary disease, unspecified: Secondary | ICD-10-CM | POA: Diagnosis not present

## 2015-08-31 DIAGNOSIS — I110 Hypertensive heart disease with heart failure: Secondary | ICD-10-CM | POA: Diagnosis not present

## 2015-08-31 DIAGNOSIS — I509 Heart failure, unspecified: Secondary | ICD-10-CM | POA: Diagnosis not present

## 2015-08-31 DIAGNOSIS — N390 Urinary tract infection, site not specified: Secondary | ICD-10-CM | POA: Diagnosis not present

## 2015-08-31 DIAGNOSIS — Z792 Long term (current) use of antibiotics: Secondary | ICD-10-CM | POA: Diagnosis not present

## 2015-08-31 DIAGNOSIS — I739 Peripheral vascular disease, unspecified: Secondary | ICD-10-CM | POA: Diagnosis not present

## 2015-08-31 DIAGNOSIS — D649 Anemia, unspecified: Secondary | ICD-10-CM | POA: Diagnosis not present

## 2015-08-31 DIAGNOSIS — Z8719 Personal history of other diseases of the digestive system: Secondary | ICD-10-CM | POA: Diagnosis not present

## 2015-09-01 DIAGNOSIS — J441 Chronic obstructive pulmonary disease with (acute) exacerbation: Secondary | ICD-10-CM | POA: Diagnosis not present

## 2015-09-01 DIAGNOSIS — Z09 Encounter for follow-up examination after completed treatment for conditions other than malignant neoplasm: Secondary | ICD-10-CM | POA: Diagnosis not present

## 2015-09-05 DIAGNOSIS — I739 Peripheral vascular disease, unspecified: Secondary | ICD-10-CM | POA: Diagnosis not present

## 2015-09-05 DIAGNOSIS — N390 Urinary tract infection, site not specified: Secondary | ICD-10-CM | POA: Diagnosis not present

## 2015-09-05 DIAGNOSIS — D649 Anemia, unspecified: Secondary | ICD-10-CM | POA: Diagnosis not present

## 2015-09-05 DIAGNOSIS — I2510 Atherosclerotic heart disease of native coronary artery without angina pectoris: Secondary | ICD-10-CM | POA: Diagnosis not present

## 2015-09-05 DIAGNOSIS — J441 Chronic obstructive pulmonary disease with (acute) exacerbation: Secondary | ICD-10-CM | POA: Diagnosis not present

## 2015-09-05 DIAGNOSIS — Z8719 Personal history of other diseases of the digestive system: Secondary | ICD-10-CM | POA: Diagnosis not present

## 2015-09-05 DIAGNOSIS — I509 Heart failure, unspecified: Secondary | ICD-10-CM | POA: Diagnosis not present

## 2015-09-05 DIAGNOSIS — Z792 Long term (current) use of antibiotics: Secondary | ICD-10-CM | POA: Diagnosis not present

## 2015-09-05 DIAGNOSIS — I110 Hypertensive heart disease with heart failure: Secondary | ICD-10-CM | POA: Diagnosis not present

## 2015-09-08 DIAGNOSIS — J449 Chronic obstructive pulmonary disease, unspecified: Secondary | ICD-10-CM | POA: Diagnosis not present

## 2015-09-12 DIAGNOSIS — I110 Hypertensive heart disease with heart failure: Secondary | ICD-10-CM | POA: Diagnosis not present

## 2015-09-12 DIAGNOSIS — Z792 Long term (current) use of antibiotics: Secondary | ICD-10-CM | POA: Diagnosis not present

## 2015-09-12 DIAGNOSIS — N390 Urinary tract infection, site not specified: Secondary | ICD-10-CM | POA: Diagnosis not present

## 2015-09-12 DIAGNOSIS — J441 Chronic obstructive pulmonary disease with (acute) exacerbation: Secondary | ICD-10-CM | POA: Diagnosis not present

## 2015-09-12 DIAGNOSIS — Z8719 Personal history of other diseases of the digestive system: Secondary | ICD-10-CM | POA: Diagnosis not present

## 2015-09-12 DIAGNOSIS — D649 Anemia, unspecified: Secondary | ICD-10-CM | POA: Diagnosis not present

## 2015-09-12 DIAGNOSIS — I2510 Atherosclerotic heart disease of native coronary artery without angina pectoris: Secondary | ICD-10-CM | POA: Diagnosis not present

## 2015-09-12 DIAGNOSIS — I739 Peripheral vascular disease, unspecified: Secondary | ICD-10-CM | POA: Diagnosis not present

## 2015-09-12 DIAGNOSIS — I509 Heart failure, unspecified: Secondary | ICD-10-CM | POA: Diagnosis not present

## 2015-09-16 DIAGNOSIS — J449 Chronic obstructive pulmonary disease, unspecified: Secondary | ICD-10-CM | POA: Diagnosis not present

## 2015-09-21 DIAGNOSIS — J441 Chronic obstructive pulmonary disease with (acute) exacerbation: Secondary | ICD-10-CM | POA: Diagnosis not present

## 2015-09-21 DIAGNOSIS — Z8719 Personal history of other diseases of the digestive system: Secondary | ICD-10-CM | POA: Diagnosis not present

## 2015-09-21 DIAGNOSIS — Z792 Long term (current) use of antibiotics: Secondary | ICD-10-CM | POA: Diagnosis not present

## 2015-09-21 DIAGNOSIS — I739 Peripheral vascular disease, unspecified: Secondary | ICD-10-CM | POA: Diagnosis not present

## 2015-09-21 DIAGNOSIS — D649 Anemia, unspecified: Secondary | ICD-10-CM | POA: Diagnosis not present

## 2015-09-21 DIAGNOSIS — I509 Heart failure, unspecified: Secondary | ICD-10-CM | POA: Diagnosis not present

## 2015-09-21 DIAGNOSIS — I2510 Atherosclerotic heart disease of native coronary artery without angina pectoris: Secondary | ICD-10-CM | POA: Diagnosis not present

## 2015-09-21 DIAGNOSIS — I110 Hypertensive heart disease with heart failure: Secondary | ICD-10-CM | POA: Diagnosis not present

## 2015-09-21 DIAGNOSIS — N390 Urinary tract infection, site not specified: Secondary | ICD-10-CM | POA: Diagnosis not present

## 2015-09-27 DIAGNOSIS — Z792 Long term (current) use of antibiotics: Secondary | ICD-10-CM | POA: Diagnosis not present

## 2015-09-27 DIAGNOSIS — I509 Heart failure, unspecified: Secondary | ICD-10-CM | POA: Diagnosis not present

## 2015-09-27 DIAGNOSIS — I2510 Atherosclerotic heart disease of native coronary artery without angina pectoris: Secondary | ICD-10-CM | POA: Diagnosis not present

## 2015-09-27 DIAGNOSIS — Z8719 Personal history of other diseases of the digestive system: Secondary | ICD-10-CM | POA: Diagnosis not present

## 2015-09-27 DIAGNOSIS — J441 Chronic obstructive pulmonary disease with (acute) exacerbation: Secondary | ICD-10-CM | POA: Diagnosis not present

## 2015-09-27 DIAGNOSIS — I110 Hypertensive heart disease with heart failure: Secondary | ICD-10-CM | POA: Diagnosis not present

## 2015-09-27 DIAGNOSIS — N390 Urinary tract infection, site not specified: Secondary | ICD-10-CM | POA: Diagnosis not present

## 2015-09-27 DIAGNOSIS — I739 Peripheral vascular disease, unspecified: Secondary | ICD-10-CM | POA: Diagnosis not present

## 2015-09-27 DIAGNOSIS — D649 Anemia, unspecified: Secondary | ICD-10-CM | POA: Diagnosis not present

## 2015-09-28 DIAGNOSIS — J441 Chronic obstructive pulmonary disease with (acute) exacerbation: Secondary | ICD-10-CM | POA: Diagnosis not present

## 2015-09-28 DIAGNOSIS — I10 Essential (primary) hypertension: Secondary | ICD-10-CM | POA: Diagnosis not present

## 2015-09-28 DIAGNOSIS — R7309 Other abnormal glucose: Secondary | ICD-10-CM | POA: Diagnosis not present

## 2015-09-28 DIAGNOSIS — Z9981 Dependence on supplemental oxygen: Secondary | ICD-10-CM | POA: Diagnosis not present

## 2015-10-01 DIAGNOSIS — J449 Chronic obstructive pulmonary disease, unspecified: Secondary | ICD-10-CM | POA: Diagnosis not present

## 2015-10-09 DIAGNOSIS — J449 Chronic obstructive pulmonary disease, unspecified: Secondary | ICD-10-CM | POA: Diagnosis not present

## 2015-10-11 DIAGNOSIS — I739 Peripheral vascular disease, unspecified: Secondary | ICD-10-CM | POA: Diagnosis not present

## 2015-10-11 DIAGNOSIS — Z792 Long term (current) use of antibiotics: Secondary | ICD-10-CM | POA: Diagnosis not present

## 2015-10-11 DIAGNOSIS — I509 Heart failure, unspecified: Secondary | ICD-10-CM | POA: Diagnosis not present

## 2015-10-11 DIAGNOSIS — I2510 Atherosclerotic heart disease of native coronary artery without angina pectoris: Secondary | ICD-10-CM | POA: Diagnosis not present

## 2015-10-11 DIAGNOSIS — Z8719 Personal history of other diseases of the digestive system: Secondary | ICD-10-CM | POA: Diagnosis not present

## 2015-10-11 DIAGNOSIS — J441 Chronic obstructive pulmonary disease with (acute) exacerbation: Secondary | ICD-10-CM | POA: Diagnosis not present

## 2015-10-11 DIAGNOSIS — D649 Anemia, unspecified: Secondary | ICD-10-CM | POA: Diagnosis not present

## 2015-10-11 DIAGNOSIS — N390 Urinary tract infection, site not specified: Secondary | ICD-10-CM | POA: Diagnosis not present

## 2015-10-11 DIAGNOSIS — I110 Hypertensive heart disease with heart failure: Secondary | ICD-10-CM | POA: Diagnosis not present

## 2015-10-14 DIAGNOSIS — J449 Chronic obstructive pulmonary disease, unspecified: Secondary | ICD-10-CM | POA: Diagnosis not present

## 2015-10-29 DIAGNOSIS — J449 Chronic obstructive pulmonary disease, unspecified: Secondary | ICD-10-CM | POA: Diagnosis not present

## 2015-11-06 DIAGNOSIS — J449 Chronic obstructive pulmonary disease, unspecified: Secondary | ICD-10-CM | POA: Diagnosis not present

## 2015-11-14 DIAGNOSIS — J449 Chronic obstructive pulmonary disease, unspecified: Secondary | ICD-10-CM | POA: Diagnosis not present

## 2015-11-29 DIAGNOSIS — J449 Chronic obstructive pulmonary disease, unspecified: Secondary | ICD-10-CM | POA: Diagnosis not present

## 2015-12-07 DIAGNOSIS — J449 Chronic obstructive pulmonary disease, unspecified: Secondary | ICD-10-CM | POA: Diagnosis not present

## 2015-12-14 DIAGNOSIS — J449 Chronic obstructive pulmonary disease, unspecified: Secondary | ICD-10-CM | POA: Diagnosis not present

## 2015-12-28 DIAGNOSIS — R609 Edema, unspecified: Secondary | ICD-10-CM | POA: Diagnosis not present

## 2015-12-28 DIAGNOSIS — L0390 Cellulitis, unspecified: Secondary | ICD-10-CM | POA: Diagnosis not present

## 2015-12-28 DIAGNOSIS — E1165 Type 2 diabetes mellitus with hyperglycemia: Secondary | ICD-10-CM | POA: Diagnosis not present

## 2015-12-28 DIAGNOSIS — I10 Essential (primary) hypertension: Secondary | ICD-10-CM | POA: Diagnosis not present

## 2015-12-28 DIAGNOSIS — E785 Hyperlipidemia, unspecified: Secondary | ICD-10-CM | POA: Diagnosis not present

## 2015-12-29 ENCOUNTER — Emergency Department (HOSPITAL_COMMUNITY): Payer: Medicare Other

## 2015-12-29 ENCOUNTER — Inpatient Hospital Stay (HOSPITAL_COMMUNITY)
Admission: EM | Admit: 2015-12-29 | Discharge: 2016-01-10 | DRG: 190 | Disposition: A | Discharge status: Home Health | Payer: Medicare Other | Attending: Internal Medicine | Admitting: Internal Medicine

## 2015-12-29 DIAGNOSIS — J9622 Acute and chronic respiratory failure with hypercapnia: Secondary | ICD-10-CM | POA: Diagnosis not present

## 2015-12-29 DIAGNOSIS — R0602 Shortness of breath: Secondary | ICD-10-CM

## 2015-12-29 DIAGNOSIS — J189 Pneumonia, unspecified organism: Secondary | ICD-10-CM | POA: Diagnosis not present

## 2015-12-29 DIAGNOSIS — Z8546 Personal history of malignant neoplasm of prostate: Secondary | ICD-10-CM

## 2015-12-29 DIAGNOSIS — I5032 Chronic diastolic (congestive) heart failure: Secondary | ICD-10-CM | POA: Diagnosis present

## 2015-12-29 DIAGNOSIS — J439 Emphysema, unspecified: Secondary | ICD-10-CM | POA: Diagnosis not present

## 2015-12-29 DIAGNOSIS — Z7951 Long term (current) use of inhaled steroids: Secondary | ICD-10-CM

## 2015-12-29 DIAGNOSIS — Z515 Encounter for palliative care: Secondary | ICD-10-CM | POA: Insufficient documentation

## 2015-12-29 DIAGNOSIS — J438 Other emphysema: Secondary | ICD-10-CM | POA: Diagnosis not present

## 2015-12-29 DIAGNOSIS — E785 Hyperlipidemia, unspecified: Secondary | ICD-10-CM | POA: Diagnosis present

## 2015-12-29 DIAGNOSIS — R059 Cough, unspecified: Secondary | ICD-10-CM

## 2015-12-29 DIAGNOSIS — I739 Peripheral vascular disease, unspecified: Secondary | ICD-10-CM | POA: Diagnosis present

## 2015-12-29 DIAGNOSIS — Z96651 Presence of right artificial knee joint: Secondary | ICD-10-CM | POA: Diagnosis present

## 2015-12-29 DIAGNOSIS — Z825 Family history of asthma and other chronic lower respiratory diseases: Secondary | ICD-10-CM | POA: Diagnosis not present

## 2015-12-29 DIAGNOSIS — E43 Unspecified severe protein-calorie malnutrition: Secondary | ICD-10-CM

## 2015-12-29 DIAGNOSIS — Z8249 Family history of ischemic heart disease and other diseases of the circulatory system: Secondary | ICD-10-CM | POA: Diagnosis not present

## 2015-12-29 DIAGNOSIS — R05 Cough: Secondary | ICD-10-CM

## 2015-12-29 DIAGNOSIS — J441 Chronic obstructive pulmonary disease with (acute) exacerbation: Secondary | ICD-10-CM | POA: Diagnosis not present

## 2015-12-29 DIAGNOSIS — Z7189 Other specified counseling: Secondary | ICD-10-CM | POA: Diagnosis not present

## 2015-12-29 DIAGNOSIS — Z7982 Long term (current) use of aspirin: Secondary | ICD-10-CM

## 2015-12-29 DIAGNOSIS — Z681 Body mass index (BMI) 19 or less, adult: Secondary | ICD-10-CM

## 2015-12-29 DIAGNOSIS — J9612 Chronic respiratory failure with hypercapnia: Secondary | ICD-10-CM

## 2015-12-29 DIAGNOSIS — I252 Old myocardial infarction: Secondary | ICD-10-CM | POA: Diagnosis not present

## 2015-12-29 DIAGNOSIS — J9601 Acute respiratory failure with hypoxia: Secondary | ICD-10-CM | POA: Diagnosis present

## 2015-12-29 DIAGNOSIS — R069 Unspecified abnormalities of breathing: Secondary | ICD-10-CM | POA: Diagnosis not present

## 2015-12-29 DIAGNOSIS — J9621 Acute and chronic respiratory failure with hypoxia: Secondary | ICD-10-CM | POA: Diagnosis not present

## 2015-12-29 DIAGNOSIS — Z955 Presence of coronary angioplasty implant and graft: Secondary | ICD-10-CM

## 2015-12-29 DIAGNOSIS — I110 Hypertensive heart disease with heart failure: Secondary | ICD-10-CM | POA: Diagnosis present

## 2015-12-29 DIAGNOSIS — J13 Pneumonia due to Streptococcus pneumoniae: Secondary | ICD-10-CM | POA: Insufficient documentation

## 2015-12-29 DIAGNOSIS — J440 Chronic obstructive pulmonary disease with acute lower respiratory infection: Principal | ICD-10-CM | POA: Diagnosis present

## 2015-12-29 DIAGNOSIS — Z9981 Dependence on supplemental oxygen: Secondary | ICD-10-CM | POA: Diagnosis not present

## 2015-12-29 DIAGNOSIS — D509 Iron deficiency anemia, unspecified: Secondary | ICD-10-CM | POA: Diagnosis not present

## 2015-12-29 DIAGNOSIS — Z87891 Personal history of nicotine dependence: Secondary | ICD-10-CM

## 2015-12-29 DIAGNOSIS — I10 Essential (primary) hypertension: Secondary | ICD-10-CM | POA: Diagnosis not present

## 2015-12-29 DIAGNOSIS — E440 Moderate protein-calorie malnutrition: Secondary | ICD-10-CM | POA: Diagnosis present

## 2015-12-29 DIAGNOSIS — J449 Chronic obstructive pulmonary disease, unspecified: Secondary | ICD-10-CM

## 2015-12-29 DIAGNOSIS — R609 Edema, unspecified: Secondary | ICD-10-CM

## 2015-12-29 DIAGNOSIS — K219 Gastro-esophageal reflux disease without esophagitis: Secondary | ICD-10-CM | POA: Diagnosis not present

## 2015-12-29 DIAGNOSIS — R21 Rash and other nonspecific skin eruption: Secondary | ICD-10-CM | POA: Diagnosis present

## 2015-12-29 DIAGNOSIS — J9611 Chronic respiratory failure with hypoxia: Secondary | ICD-10-CM | POA: Diagnosis present

## 2015-12-29 DIAGNOSIS — I2510 Atherosclerotic heart disease of native coronary artery without angina pectoris: Secondary | ICD-10-CM | POA: Diagnosis not present

## 2015-12-29 DIAGNOSIS — R54 Age-related physical debility: Secondary | ICD-10-CM | POA: Diagnosis not present

## 2015-12-29 DIAGNOSIS — Z79899 Other long term (current) drug therapy: Secondary | ICD-10-CM

## 2015-12-29 DIAGNOSIS — J154 Pneumonia due to other streptococci: Secondary | ICD-10-CM | POA: Diagnosis present

## 2015-12-29 LAB — BASIC METABOLIC PANEL
ANION GAP: 10 (ref 5–15)
BUN: 13 mg/dL (ref 6–20)
CHLORIDE: 104 mmol/L (ref 101–111)
CO2: 25 mmol/L (ref 22–32)
Calcium: 8.8 mg/dL — ABNORMAL LOW (ref 8.9–10.3)
Creatinine, Ser: 1.01 mg/dL (ref 0.61–1.24)
GFR calc Af Amer: >60 mL/min (ref >60)
GFR calc non Af Amer: >60 mL/min (ref >60)
Glucose, Bld: 131 mg/dL — ABNORMAL HIGH (ref 65–99)
POTASSIUM: 4 mmol/L (ref 3.5–5.1)
Sodium: 139 mmol/L (ref 135–145)

## 2015-12-29 LAB — CBC WITH DIFFERENTIAL/PLATELET
BASOS ABS: 0 10*3/uL (ref 0.0–0.1)
Basophils Relative: 0 %
Eosinophils Absolute: 0 10*3/uL (ref 0.0–0.7)
Eosinophils Relative: 0 %
HEMATOCRIT: 28.1 % — AB (ref 39.0–52.0)
Hemoglobin: 9.2 g/dL — ABNORMAL LOW (ref 13.0–17.0)
LYMPHS PCT: 3 %
Lymphs Abs: 0.3 10*3/uL — ABNORMAL LOW (ref 0.7–4.0)
MCH: 27.6 pg (ref 26.0–34.0)
MCHC: 32.7 g/dL (ref 30.0–36.0)
MCV: 84.4 fL (ref 78.0–100.0)
MONO ABS: 0.6 10*3/uL (ref 0.1–1.0)
Monocytes Relative: 5 %
NEUTROS ABS: 10.5 10*3/uL — AB (ref 1.7–7.7)
Neutrophils Relative %: 92 %
Platelets: 208 10*3/uL (ref 150–400)
RBC: 3.33 MIL/uL — AB (ref 4.22–5.81)
RDW: 14.4 % (ref 11.5–15.5)
WBC: 11.5 10*3/uL — ABNORMAL HIGH (ref 4.0–10.5)

## 2015-12-29 LAB — I-STAT TROPONIN, ED: Troponin i, poc: 0 ng/mL (ref 0.00–0.08)

## 2015-12-29 MED ORDER — IPRATROPIUM BROMIDE 0.02 % IN SOLN
0.5000 mg | Freq: Once | RESPIRATORY_TRACT | Status: AC
  Administered 2015-12-29: 0.5 mg via RESPIRATORY_TRACT
  Filled 2015-12-29: qty 2.5

## 2015-12-29 MED ORDER — DEXTROSE 5 % IV SOLN
1.0000 g | Freq: Once | INTRAVENOUS | Status: AC
  Administered 2015-12-29: 1 g via INTRAVENOUS
  Filled 2015-12-29: qty 10

## 2015-12-29 MED ORDER — ALBUTEROL SULFATE (2.5 MG/3ML) 0.083% IN NEBU
5.0000 mg | INHALATION_SOLUTION | Freq: Once | RESPIRATORY_TRACT | Status: AC
  Administered 2015-12-29: 5 mg via RESPIRATORY_TRACT
  Filled 2015-12-29: qty 6

## 2015-12-29 MED ORDER — AZITHROMYCIN 500 MG IV SOLR
500.0000 mg | Freq: Once | INTRAVENOUS | Status: AC
  Administered 2015-12-29: 500 mg via INTRAVENOUS
  Filled 2015-12-29: qty 500

## 2015-12-29 NOTE — ED Notes (Signed)
Pt receiving breathing tx at this time 
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
## 2015-12-29 NOTE — ED Notes (Signed)
Pt st's he is normally short of breath but today it became worse.  Pt used his inhaler and neb tx without relief.    Pt was given a duo neb by EMS and Solu Medrol 125mg  PTA.   Pt is on home 02 at 2LPM at all times

## 2015-12-29 NOTE — ED Notes (Signed)
Patient transported to X-ray 

## 2015-12-29 NOTE — ED Notes (Signed)
Attempted report 

## 2015-12-30 ENCOUNTER — Encounter (HOSPITAL_COMMUNITY): Payer: Self-pay | Admitting: Internal Medicine

## 2015-12-30 DIAGNOSIS — I739 Peripheral vascular disease, unspecified: Secondary | ICD-10-CM | POA: Diagnosis present

## 2015-12-30 DIAGNOSIS — Z7982 Long term (current) use of aspirin: Secondary | ICD-10-CM | POA: Diagnosis not present

## 2015-12-30 DIAGNOSIS — R05 Cough: Secondary | ICD-10-CM | POA: Diagnosis not present

## 2015-12-30 DIAGNOSIS — I10 Essential (primary) hypertension: Secondary | ICD-10-CM | POA: Diagnosis not present

## 2015-12-30 DIAGNOSIS — E785 Hyperlipidemia, unspecified: Secondary | ICD-10-CM | POA: Diagnosis present

## 2015-12-30 DIAGNOSIS — J441 Chronic obstructive pulmonary disease with (acute) exacerbation: Secondary | ICD-10-CM | POA: Diagnosis not present

## 2015-12-30 DIAGNOSIS — Z7189 Other specified counseling: Secondary | ICD-10-CM | POA: Diagnosis not present

## 2015-12-30 DIAGNOSIS — J440 Chronic obstructive pulmonary disease with acute lower respiratory infection: Secondary | ICD-10-CM | POA: Diagnosis present

## 2015-12-30 DIAGNOSIS — Z7951 Long term (current) use of inhaled steroids: Secondary | ICD-10-CM | POA: Diagnosis not present

## 2015-12-30 DIAGNOSIS — J9601 Acute respiratory failure with hypoxia: Secondary | ICD-10-CM | POA: Diagnosis not present

## 2015-12-30 DIAGNOSIS — D509 Iron deficiency anemia, unspecified: Secondary | ICD-10-CM | POA: Diagnosis not present

## 2015-12-30 DIAGNOSIS — Z955 Presence of coronary angioplasty implant and graft: Secondary | ICD-10-CM | POA: Diagnosis not present

## 2015-12-30 DIAGNOSIS — I2510 Atherosclerotic heart disease of native coronary artery without angina pectoris: Secondary | ICD-10-CM | POA: Diagnosis not present

## 2015-12-30 DIAGNOSIS — Z8249 Family history of ischemic heart disease and other diseases of the circulatory system: Secondary | ICD-10-CM | POA: Diagnosis not present

## 2015-12-30 DIAGNOSIS — R54 Age-related physical debility: Secondary | ICD-10-CM | POA: Diagnosis present

## 2015-12-30 DIAGNOSIS — K219 Gastro-esophageal reflux disease without esophagitis: Secondary | ICD-10-CM | POA: Diagnosis present

## 2015-12-30 DIAGNOSIS — J154 Pneumonia due to other streptococci: Secondary | ICD-10-CM | POA: Diagnosis present

## 2015-12-30 DIAGNOSIS — J13 Pneumonia due to Streptococcus pneumoniae: Secondary | ICD-10-CM | POA: Diagnosis not present

## 2015-12-30 DIAGNOSIS — J9622 Acute and chronic respiratory failure with hypercapnia: Secondary | ICD-10-CM | POA: Diagnosis present

## 2015-12-30 DIAGNOSIS — J9612 Chronic respiratory failure with hypercapnia: Secondary | ICD-10-CM | POA: Diagnosis not present

## 2015-12-30 DIAGNOSIS — J449 Chronic obstructive pulmonary disease, unspecified: Secondary | ICD-10-CM | POA: Diagnosis not present

## 2015-12-30 DIAGNOSIS — J439 Emphysema, unspecified: Secondary | ICD-10-CM | POA: Diagnosis not present

## 2015-12-30 DIAGNOSIS — Z515 Encounter for palliative care: Secondary | ICD-10-CM | POA: Diagnosis not present

## 2015-12-30 DIAGNOSIS — Z825 Family history of asthma and other chronic lower respiratory diseases: Secondary | ICD-10-CM | POA: Diagnosis not present

## 2015-12-30 DIAGNOSIS — J189 Pneumonia, unspecified organism: Secondary | ICD-10-CM | POA: Diagnosis not present

## 2015-12-30 DIAGNOSIS — Z681 Body mass index (BMI) 19 or less, adult: Secondary | ICD-10-CM | POA: Diagnosis not present

## 2015-12-30 DIAGNOSIS — R21 Rash and other nonspecific skin eruption: Secondary | ICD-10-CM | POA: Diagnosis present

## 2015-12-30 DIAGNOSIS — Z79899 Other long term (current) drug therapy: Secondary | ICD-10-CM | POA: Diagnosis not present

## 2015-12-30 DIAGNOSIS — I110 Hypertensive heart disease with heart failure: Secondary | ICD-10-CM | POA: Diagnosis present

## 2015-12-30 DIAGNOSIS — I5032 Chronic diastolic (congestive) heart failure: Secondary | ICD-10-CM | POA: Diagnosis present

## 2015-12-30 DIAGNOSIS — J9621 Acute and chronic respiratory failure with hypoxia: Secondary | ICD-10-CM | POA: Diagnosis present

## 2015-12-30 DIAGNOSIS — I252 Old myocardial infarction: Secondary | ICD-10-CM | POA: Diagnosis not present

## 2015-12-30 DIAGNOSIS — Z8546 Personal history of malignant neoplasm of prostate: Secondary | ICD-10-CM | POA: Diagnosis not present

## 2015-12-30 DIAGNOSIS — Z9981 Dependence on supplemental oxygen: Secondary | ICD-10-CM | POA: Diagnosis not present

## 2015-12-30 DIAGNOSIS — Z87891 Personal history of nicotine dependence: Secondary | ICD-10-CM | POA: Diagnosis not present

## 2015-12-30 DIAGNOSIS — E440 Moderate protein-calorie malnutrition: Secondary | ICD-10-CM | POA: Diagnosis present

## 2015-12-30 DIAGNOSIS — Z96651 Presence of right artificial knee joint: Secondary | ICD-10-CM | POA: Diagnosis present

## 2015-12-30 LAB — COMPREHENSIVE METABOLIC PANEL
ALBUMIN: 2.8 g/dL — AB (ref 3.5–5.0)
ALT: 15 U/L — ABNORMAL LOW (ref 17–63)
ANION GAP: 12 (ref 5–15)
AST: 28 U/L (ref 15–41)
Alkaline Phosphatase: 79 U/L (ref 38–126)
BUN: 16 mg/dL (ref 6–20)
CHLORIDE: 98 mmol/L — AB (ref 101–111)
CO2: 28 mmol/L (ref 22–32)
Calcium: 8.9 mg/dL (ref 8.9–10.3)
Creatinine, Ser: 1.07 mg/dL (ref 0.61–1.24)
GFR calc Af Amer: >60 mL/min (ref >60)
GLUCOSE: 172 mg/dL — AB (ref 65–99)
POTASSIUM: 4.1 mmol/L (ref 3.5–5.1)
Sodium: 138 mmol/L (ref 135–145)
Total Bilirubin: 0.3 mg/dL (ref 0.3–1.2)
Total Protein: 5.4 g/dL — ABNORMAL LOW (ref 6.5–8.1)

## 2015-12-30 LAB — CBC
HEMATOCRIT: 27.1 % — AB (ref 39.0–52.0)
HEMOGLOBIN: 8.7 g/dL — AB (ref 13.0–17.0)
MCH: 27.2 pg (ref 26.0–34.0)
MCHC: 32.1 g/dL (ref 30.0–36.0)
MCV: 84.7 fL (ref 78.0–100.0)
Platelets: 172 10*3/uL (ref 150–400)
RBC: 3.2 MIL/uL — ABNORMAL LOW (ref 4.22–5.81)
RDW: 14.2 % (ref 11.5–15.5)
WBC: 12.9 10*3/uL — ABNORMAL HIGH (ref 4.0–10.5)

## 2015-12-30 LAB — TROPONIN I
TROPONIN I: 0.05 ng/mL — AB (ref <0.031)
TROPONIN I: 0.03 ng/mL (ref <0.031)
Troponin I: <0.03 ng/mL (ref <0.031)

## 2015-12-30 LAB — CBC WITH DIFFERENTIAL/PLATELET
Basophils Absolute: 0 10*3/uL (ref 0.0–0.1)
Basophils Relative: 0 %
EOS PCT: 0 %
Eosinophils Absolute: 0 10*3/uL (ref 0.0–0.7)
HCT: 27.6 % — ABNORMAL LOW (ref 39.0–52.0)
Hemoglobin: 9.1 g/dL — ABNORMAL LOW (ref 13.0–17.0)
LYMPHS ABS: 0.3 10*3/uL — AB (ref 0.7–4.0)
LYMPHS PCT: 3 %
MCH: 27.8 pg (ref 26.0–34.0)
MCHC: 33 g/dL (ref 30.0–36.0)
MCV: 84.4 fL (ref 78.0–100.0)
Monocytes Absolute: 0.2 10*3/uL (ref 0.1–1.0)
Monocytes Relative: 2 %
NEUTROS ABS: 11.1 10*3/uL — AB (ref 1.7–7.7)
Neutrophils Relative %: 95 %
PLATELETS: 204 10*3/uL (ref 150–400)
RBC: 3.27 MIL/uL — AB (ref 4.22–5.81)
RDW: 14.3 % (ref 11.5–15.5)
WBC: 11.6 10*3/uL — AB (ref 4.0–10.5)

## 2015-12-30 LAB — EXPECTORATED SPUTUM ASSESSMENT W GRAM STAIN, RFLX TO RESP C

## 2015-12-30 LAB — STREP PNEUMONIAE URINARY ANTIGEN: STREP PNEUMO URINARY ANTIGEN: POSITIVE — AB

## 2015-12-30 LAB — CREATININE, SERUM: Creatinine, Ser: 1.03 mg/dL (ref 0.61–1.24)

## 2015-12-30 LAB — HIV ANTIBODY (ROUTINE TESTING W REFLEX): HIV Screen 4th Generation wRfx: NONREACTIVE

## 2015-12-30 LAB — BRAIN NATRIURETIC PEPTIDE: B Natriuretic Peptide: 155.1 pg/mL — ABNORMAL HIGH (ref 0.0–100.0)

## 2015-12-30 MED ORDER — AZITHROMYCIN 500 MG IV SOLR
500.0000 mg | INTRAVENOUS | Status: DC
Start: 2015-12-30 — End: 2016-01-01
  Administered 2015-12-30 – 2015-12-31 (×2): 500 mg via INTRAVENOUS
  Filled 2015-12-30 (×2): qty 500

## 2015-12-30 MED ORDER — IPRATROPIUM-ALBUTEROL 0.5-2.5 (3) MG/3ML IN SOLN
3.0000 mL | Freq: Three times a day (TID) | RESPIRATORY_TRACT | Status: DC
  Administered 2015-12-30: 3 mL via RESPIRATORY_TRACT
  Filled 2015-12-30: qty 3

## 2015-12-30 MED ORDER — SODIUM CHLORIDE 0.9% FLUSH
3.0000 mL | Freq: Two times a day (BID) | INTRAVENOUS | Status: DC
  Administered 2015-12-30 – 2016-01-09 (×20): 3 mL via INTRAVENOUS

## 2015-12-30 MED ORDER — NITROGLYCERIN 0.4 MG SL SUBL: 0.4000 mg | SUBLINGUAL_TABLET | SUBLINGUAL | Status: DC | PRN

## 2015-12-30 MED ORDER — SPIRONOLACTONE 25 MG PO TABS
25.0000 mg | ORAL_TABLET | Freq: Every day | ORAL | Status: DC
  Administered 2015-12-30 – 2016-01-10 (×12): 25 mg via ORAL
  Filled 2015-12-30 (×12): qty 1

## 2015-12-30 MED ORDER — ACETAMINOPHEN 325 MG PO TABS
650.0000 mg | ORAL_TABLET | Freq: Four times a day (QID) | ORAL | Status: DC | PRN
  Administered 2016-01-01 – 2016-01-03 (×4): 650 mg via ORAL
  Filled 2015-12-30 (×4): qty 2

## 2015-12-30 MED ORDER — IPRATROPIUM-ALBUTEROL 0.5-2.5 (3) MG/3ML IN SOLN
3.0000 mL | RESPIRATORY_TRACT | Status: DC
  Administered 2015-12-30 – 2015-12-31 (×7): 3 mL via RESPIRATORY_TRACT
  Filled 2015-12-30 (×7): qty 3

## 2015-12-30 MED ORDER — TRAMADOL HCL 50 MG PO TABS
50.0000 mg | ORAL_TABLET | Freq: Four times a day (QID) | ORAL | Status: DC | PRN
  Administered 2015-12-30 – 2016-01-10 (×9): 50 mg via ORAL
  Filled 2015-12-30 (×9): qty 1

## 2015-12-30 MED ORDER — ONDANSETRON HCL 4 MG/2ML IJ SOLN: 4.0000 mg | Freq: Four times a day (QID) | INTRAMUSCULAR | Status: DC | PRN

## 2015-12-30 MED ORDER — ISOSORBIDE MONONITRATE ER 60 MG PO TB24
60.0000 mg | ORAL_TABLET | Freq: Every day | ORAL | Status: DC
  Administered 2015-12-30 – 2016-01-10 (×12): 60 mg via ORAL
  Filled 2015-12-30 (×12): qty 1

## 2015-12-30 MED ORDER — MOMETASONE FURO-FORMOTEROL FUM 200-5 MCG/ACT IN AERO
2.0000 | INHALATION_SPRAY | Freq: Two times a day (BID) | RESPIRATORY_TRACT | Status: DC
  Administered 2015-12-30 – 2016-01-03 (×9): 2 via RESPIRATORY_TRACT
  Filled 2015-12-30: qty 8.8

## 2015-12-30 MED ORDER — ENOXAPARIN SODIUM 30 MG/0.3ML ~~LOC~~ SOLN
30.0000 mg | SUBCUTANEOUS | Status: DC
  Administered 2015-12-30 – 2016-01-10 (×12): 30 mg via SUBCUTANEOUS
  Filled 2015-12-30 (×12): qty 0.3

## 2015-12-30 MED ORDER — ASPIRIN EC 81 MG PO TBEC
81.0000 mg | DELAYED_RELEASE_TABLET | Freq: Every day | ORAL | Status: DC
  Administered 2015-12-30 – 2016-01-10 (×12): 81 mg via ORAL
  Filled 2015-12-30 (×12): qty 1

## 2015-12-30 MED ORDER — IPRATROPIUM-ALBUTEROL 0.5-2.5 (3) MG/3ML IN SOLN
3.0000 mL | RESPIRATORY_TRACT | Status: DC
  Administered 2015-12-30 (×3): 3 mL via RESPIRATORY_TRACT
  Filled 2015-12-30 (×2): qty 3

## 2015-12-30 MED ORDER — ONDANSETRON HCL 4 MG PO TABS: 4.0000 mg | ORAL_TABLET | Freq: Four times a day (QID) | ORAL | Status: DC | PRN

## 2015-12-30 MED ORDER — ENSURE ENLIVE PO LIQD
237.0000 mL | Freq: Three times a day (TID) | ORAL | Status: DC
  Administered 2015-12-30 – 2016-01-03 (×11): 237 mL via ORAL

## 2015-12-30 MED ORDER — ALBUTEROL SULFATE (2.5 MG/3ML) 0.083% IN NEBU
2.5000 mg | INHALATION_SOLUTION | RESPIRATORY_TRACT | Status: DC | PRN
  Administered 2016-01-01 – 2016-01-04 (×2): 2.5 mg via RESPIRATORY_TRACT
  Filled 2015-12-30 (×2): qty 3

## 2015-12-30 MED ORDER — AMLODIPINE BESYLATE 10 MG PO TABS
10.0000 mg | ORAL_TABLET | Freq: Every day | ORAL | Status: DC
  Administered 2015-12-30 – 2016-01-10 (×12): 10 mg via ORAL
  Filled 2015-12-30 (×12): qty 1

## 2015-12-30 MED ORDER — CEFTRIAXONE SODIUM 1 G IJ SOLR
1.0000 g | INTRAMUSCULAR | Status: DC
  Administered 2015-12-30: 1 g via INTRAVENOUS
  Filled 2015-12-30 (×2): qty 10

## 2015-12-30 MED ORDER — PANTOPRAZOLE SODIUM 40 MG PO TBEC
40.0000 mg | DELAYED_RELEASE_TABLET | Freq: Every day | ORAL | Status: DC
  Administered 2015-12-30 – 2016-01-10 (×12): 40 mg via ORAL
  Filled 2015-12-30 (×12): qty 1

## 2015-12-30 MED ORDER — FUROSEMIDE 10 MG/ML IJ SOLN
20.0000 mg | Freq: Two times a day (BID) | INTRAMUSCULAR | Status: DC
  Administered 2015-12-30: 20 mg via INTRAVENOUS
  Filled 2015-12-30: qty 2

## 2015-12-30 MED ORDER — ACETAMINOPHEN 650 MG RE SUPP: 650.0000 mg | Freq: Four times a day (QID) | RECTAL | Status: DC | PRN

## 2015-12-30 MED ORDER — SIMVASTATIN 20 MG PO TABS
20.0000 mg | ORAL_TABLET | Freq: Every day | ORAL | Status: DC
  Administered 2015-12-30 – 2016-01-10 (×12): 20 mg via ORAL
  Filled 2015-12-30 (×12): qty 1

## 2015-12-30 MED ORDER — FUROSEMIDE 20 MG PO TABS
20.0000 mg | ORAL_TABLET | Freq: Two times a day (BID) | ORAL | Status: DC
  Administered 2015-12-30 – 2016-01-10 (×22): 20 mg via ORAL
  Filled 2015-12-30 (×22): qty 1

## 2015-12-30 MED ORDER — IPRATROPIUM-ALBUTEROL 0.5-2.5 (3) MG/3ML IN SOLN: 3.0000 mL | RESPIRATORY_TRACT | Status: DC | PRN
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
## 2015-12-30 NOTE — Progress Notes (Signed)
PROGRESS NOTE  Cristian Collins  [id number] DOB: 1930-01-16  DOA: 12/29/2015 PCP: [name], MD   Brief Narrative:  82 year old male, married, lives with spouse and ambulates with the help of a cane/walker, not on home oxygen, PMH of COPD, HTN, HLD, CAD, chronic chest pain, chronic diastolic CHF, GERD, former smoker, presented to [facility] ED on 12/29/15 with progressively worsening cough and dyspnea of 4 days' duration. He gives chronic, several months history of left sided chest pain which seems atypical-his PCP/cardiologists are aware. Seen by his PCP for itchy rash which is now resolving and was referred to a dermatologist. Admitted for pneumonia.   Assessment & Plan:   Principal Problem:   Pneumonia Active Problems:   COPD exacerbation ([place])   Essential hypertension   CAD (coronary artery disease)   Anemia, iron deficiency   Community-acquired RLL pneumonia - Continue empirically started IV Rocephin and azithromycin. - Recommend repeating chest x-ray in 3-4 weeks to ensure resolution of pneumonia findings.  Severe COPD - Apparently was mildly wheezing in the ED. This seems to have resolved. Stable. Continue oxygen, when necessary bronchodilator nebulizations. Antibiotics as above. No steroids for now.  Atypical chest pain/chronic chest pain syndrome - Apparently has had it for several months. He states that his PCP and primary cardiologist (Dr. [name]) are aware. One of 3 troponins minimally elevated but the third and last 1 was negative. EKG without acute findings. - Discussed with primary cardiologist: Patient has chronic chest pain syndrome. Cardiac cath had shown patent RCA stent and no other abnormalities. - Continue aspirin, statins and nitrates.  Essential hypertension Controlled   Chronic diastolic CHF - Compensated. LVEF of January 2017: 65-70 percent. Continue home dose of Lasix & Aldactone.  Hyperlipidemia  - Statins  Chronic anemia - Stable.  GERD -  PPI   DVT prophylaxis: Lovenox Code Status: Full Family Communication: Discussed with patient. No family at bedside.  Disposition Plan: DC home when medically stable.   Consultants:   None   Procedures:   None   Antimicrobials:   IV Rocephin  Azithromycin    Subjective: Feels much better. Dyspnea improved. Mild cough. Chronic left-sided chest pain-some of it is reproducible to touch.   Objective:  Filed Vitals:   12/30/15 0519 12/30/15 0747 12/30/15 1322 12/30/15 1334  BP: 145/50  135/64   Pulse: 73  72   Temp: 97.5 F (36.4 C)  97.5 F (36.4 C)   TempSrc:      Resp: 18  20   Height:      Weight: 45.7 kg (100 lb 12 oz)     SpO2: 96% 99% 100% 99%    Intake/Output Summary (Last 24 hours) at 12/30/15 1606 Last data filed at 12/30/15 1520  Gross per 24 hour  Intake    373 ml  Output   1250 ml  Net   -877 ml   Filed Weights   12/29/15 2002 12/29/15 2355 12/30/15 0519  Weight: 46.72 kg (103 lb) 45.7 kg (100 lb 12 oz) 45.7 kg (100 lb 12 oz)    Examination:  General exam: Pleasant elderly male, moderately built, frail, lying comfortably propped up in bed without distress  Respiratory system: distant sounding breath sounds but seems clear to auscultation without wheezing or rhonchi. Respiratory effort normal. Cardiovascular system: S1 & S2 heard, RRR. No JVD, murmurs, rubs, gallops or clicks. No pedal edema. Gastrointestinal system: Abdomen is nondistended, soft and nontender. No organomegaly or masses felt. Normal bowel sounds heard. Central nervous  system: Alert and oriented. No focal neurological deficits. Extremities: Symmetric 5 x 5 power. Skin: some fading rash on left leg and hypopigmented skin lesions on forearms.  Psychiatry: Judgement and insight appear normal. Mood & affect appropriate.     Data Reviewed: I have personally reviewed following labs and imaging studies  CBC:  Recent Labs Lab 12/29/15 2136 12/30/15 0309 12/30/15 0648  WBC  11.5* 12.9* 11.6*  NEUTROABS 10.5*  --  11.1*  HGB 9.2* 8.7* 9.1*  HCT 28.1* 27.1* 27.6*  MCV 84.4 84.7 84.4  PLT 208 172 0000000   Basic Metabolic Panel:  Recent Labs Lab 12/29/15 2136 12/30/15 0309 12/30/15 0648  NA 139  --  138  K 4.0  --  4.1  CL 104  --  98*  CO2 25  --  28  GLUCOSE 131*  --  172*  BUN 13  --  16  CREATININE 1.01 1.03 1.07  CALCIUM 8.8*  --  8.9   GFR: Estimated Creatinine Clearance: 33.8 mL/min (by C-G formula based on Cr of 1.07). Liver Function Tests:  Recent Labs Lab 12/30/15 0648  AST 28  ALT 15*  ALKPHOS 79  BILITOT 0.3  PROT 5.4*  ALBUMIN 2.8*   No results for input(s): LIPASE, AMYLASE in the last 168 hours. No results for input(s): AMMONIA in the last 168 hours. Coagulation Profile: No results for input(s): INR, PROTIME in the last 168 hours. Cardiac Enzymes:  Recent Labs Lab 12/30/15 0309 12/30/15 0648 12/30/15 1209  TROPONINI <0.03 0.05* 0.03   BNP (last 3 results) No results for input(s): PROBNP in the last 8760 hours. HbA1C: No results for input(s): HGBA1C in the last 72 hours. CBG: No results for input(s): GLUCAP in the last 168 hours. Lipid Profile: No results for input(s): CHOL, HDL, LDLCALC, TRIG, CHOLHDL, LDLDIRECT in the last 72 hours. Thyroid Function Tests: No results for input(s): TSH, T4TOTAL, FREET4, T3FREE, THYROIDAB in the last 72 hours. Anemia Panel: No results for input(s): VITAMINB12, FOLATE, FERRITIN, TIBC, IRON, RETICCTPCT in the last 72 hours.  Sepsis Labs: No results for input(s): PROCALCITON, LATICACIDVEN in the last 168 hours.  No results found for this or any previous visit (from the past 240 hour(s)).       Radiology Studies: Dg Chest 2 View  12/29/2015  CLINICAL DATA:  Shortness of breath.  COPD exacerbation. EXAM: CHEST  2 VIEW COMPARISON:  08/12/2015 chest radiograph. FINDINGS: Stable cardiomediastinal silhouette with normal heart size. No pneumothorax. No pleural effusion. Emphysema and  hyperinflated lungs. No pulmonary edema. Mild hazy opacity in the right lower lobe appears new. IMPRESSION: 1. Mild hazy right lower lobe opacity appears new and suggests right lower lobe pneumonia or aspiration. Recommend follow-up PA and lateral post treatment chest radiographs in 4-6 weeks. 2. Emphysema and hyperinflated lungs, consistent with the provided history of COPD. Electronically Signed   By: Ilona Sorrel M.D.   On: 12/29/2015 21:08        Scheduled Meds: . amLODipine  10 mg Oral Daily  . aspirin EC  81 mg Oral Daily  . azithromycin  500 mg Intravenous Q24H  . cefTRIAXone (ROCEPHIN)  IV  1 g Intravenous Q24H  . enoxaparin (LOVENOX) injection  30 mg Subcutaneous Q24H  . feeding supplement (ENSURE ENLIVE)  237 mL Oral TID BM  . furosemide  20 mg Intravenous Q12H  . ipratropium-albuterol  3 mL Nebulization TID  . isosorbide mononitrate  60 mg Oral Daily  . mometasone-formoterol  2 puff Inhalation  BID  . pantoprazole  40 mg Oral Daily  . simvastatin  20 mg Oral Daily  . sodium chloride flush  3 mL Intravenous Q12H  . spironolactone  25 mg Oral Daily   Continuous Infusions:    LOS: 0 days    Time spent: 35 minutes.    [facility], MD [facility] Pager [phone]  If 7PM-7AM, please contact night-coverage [email or web address] Password [email or web address] 12/30/2015, 4:06 PM

## 2015-12-31 MED ORDER — WHITE PETROLATUM GEL
Status: AC
  Filled 2015-12-31: qty 1

## 2015-12-31 MED ORDER — CEFTRIAXONE SODIUM 2 G IJ SOLR
2.0000 g | INTRAMUSCULAR | Status: AC
  Administered 2015-12-31 – 2016-01-05 (×6): 2 g via INTRAVENOUS
  Filled 2015-12-31 (×6): qty 2

## 2015-12-31 MED ORDER — IPRATROPIUM-ALBUTEROL 0.5-2.5 (3) MG/3ML IN SOLN
3.0000 mL | Freq: Four times a day (QID) | RESPIRATORY_TRACT | Status: DC
  Administered 2016-01-01 – 2016-01-05 (×16): 3 mL via RESPIRATORY_TRACT
  Filled 2015-12-31 (×15): qty 3

## 2015-12-31 NOTE — Progress Notes (Signed)
PROGRESS NOTE  Cristian Collins  [id number] DOB: [birth year]  DOA: 12/29/2015 PCP: [name], MD   Brief Narrative:  82 year old male, married, lives with spouse and ambulates with the help of a cane/walker, not on home oxygen, PMH of COPD, HTN, HLD, CAD, chronic chest pain, chronic diastolic CHF, GERD, former smoker, presented to [facility] ED on 12/29/15 with progressively worsening cough and dyspnea of 4 days' duration. He gives chronic, several months history of left sided chest pain which seems atypical-his PCP/cardiologists are aware. Seen by his PCP for itchy rash which is now resolving and was referred to a dermatologist. Admitted for pneumonia.   Assessment & Plan:   Principal Problem:   Pneumonia Active Problems:   COPD exacerbation ([place])   Essential hypertension   CAD (coronary artery disease)   Anemia, iron deficiency   Community acquired pneumonia   Community-acquired RLL streptococcal pneumonia - Continue empirically started IV Rocephin and azithromycin. - Urine streptococcal antigen positive. - Recommend repeating chest x-ray in 3-4 weeks to ensure resolution of pneumonia findings.  Severe COPD - Intermittent wheezing. Continue oxygen, when necessary bronchodilator nebulizations. Antibiotics as above. No steroids for now.  Atypical chest pain/chronic chest pain syndrome - Apparently has had it for several months. He states that his PCP and primary cardiologist (Dr. [name]) are aware. One of 3 troponins minimally elevated but the third and last 1 was negative. EKG without acute findings. - Discussed with primary cardiologist: Patient has chronic chest pain syndrome. Cardiac cath had shown patent RCA stent and no other abnormalities. - Continue aspirin, statins and nitrates.  Essential hypertension Controlled   Chronic diastolic CHF - Compensated. LVEF of January 2017: 65-70 percent. Continue home dose of Lasix & Aldactone.  Hyperlipidemia  - Statins  Chronic  anemia - Stable.  GERD - PPI   DVT prophylaxis: Lovenox Code Status: Full Family Communication: Discussed with patient. No family at bedside.  Disposition Plan: DC home when medically stable.   Consultants:   None   Procedures:   None   Antimicrobials:   IV Rocephin  Azithromycin    Subjective: Chronic dyspnea & wheezing on exertion. Chronic left-sided chest pain. Overall says feels better. As per RN, no acute issues.  Objective:  Filed Vitals:   12/31/15 0509 12/31/15 0730 12/31/15 1101 12/31/15 1243  BP: 130/51   114/56  Pulse: 67   77  Temp: 97.6 F (36.4 C)   97.6 F (36.4 C)  TempSrc:      Resp: 18   20  Height:      Weight: 45.859 kg (101 lb 1.6 oz)     SpO2: 100% 97% 98% 100%    Intake/Output Summary (Last 24 hours) at 12/31/15 1359 Last data filed at 12/31/15 0904  Gross per 24 hour  Intake    560 ml  Output   1150 ml  Net   -590 ml   Filed Weights   12/29/15 2355 12/30/15 0519 12/31/15 0509  Weight: 45.7 kg (100 lb 12 oz) 45.7 kg (100 lb 12 oz) 45.859 kg (101 lb 1.6 oz)    Examination:  General exam: Pleasant elderly male, moderately built, frail, sitting up eating breakfast this morning. Respiratory system: Slightly harsh breath sounds bilaterally with few bilateral medium pitched expiratory rhonchi. Respiratory effort normal. Cardiovascular system: S1 & S2 heard, RRR. No JVD, murmurs, rubs, gallops or clicks. No pedal edema. Gastrointestinal system: Abdomen is nondistended, soft and nontender. No organomegaly or masses felt. Normal bowel sounds heard. Central nervous  system: Alert and oriented. No focal neurological deficits. Extremities: Symmetric 5 x 5 power. Skin: some fading rash on left leg and hypopigmented skin lesions on forearms.  Psychiatry: Judgement and insight appear normal. Mood & affect appropriate.     Data Reviewed: I have personally reviewed following labs and imaging studies  CBC:  Recent Labs Lab  12/29/15 2136 12/30/15 0309 12/30/15 0648  WBC 11.5* 12.9* 11.6*  NEUTROABS 10.5*  --  11.1*  HGB 9.2* 8.7* 9.1*  HCT 28.1* 27.1* 27.6*  MCV 84.4 84.7 84.4  PLT 208 172 0000000   Basic Metabolic Panel:  Recent Labs Lab 12/29/15 2136 12/30/15 0309 12/30/15 0648  NA 139  --  138  K 4.0  --  4.1  CL 104  --  98*  CO2 25  --  28  GLUCOSE 131*  --  172*  BUN 13  --  16  CREATININE 1.01 1.03 1.07  CALCIUM 8.8*  --  8.9   GFR: Estimated Creatinine Clearance: 34 mL/min (by C-G formula based on Cr of 1.07). Liver Function Tests:  Recent Labs Lab 12/30/15 0648  AST 28  ALT 15*  ALKPHOS 79  BILITOT 0.3  PROT 5.4*  ALBUMIN 2.8*   No results for input(s): LIPASE, AMYLASE in the last 168 hours. No results for input(s): AMMONIA in the last 168 hours. Coagulation Profile: No results for input(s): INR, PROTIME in the last 168 hours. Cardiac Enzymes:  Recent Labs Lab 12/30/15 0309 12/30/15 0648 12/30/15 1209  TROPONINI <0.03 0.05* 0.03   BNP (last 3 results) No results for input(s): PROBNP in the last 8760 hours. HbA1C: No results for input(s): HGBA1C in the last 72 hours. CBG: No results for input(s): GLUCAP in the last 168 hours. Lipid Profile: No results for input(s): CHOL, HDL, LDLCALC, TRIG, CHOLHDL, LDLDIRECT in the last 72 hours. Thyroid Function Tests: No results for input(s): TSH, T4TOTAL, FREET4, T3FREE, THYROIDAB in the last 72 hours. Anemia Panel: No results for input(s): VITAMINB12, FOLATE, FERRITIN, TIBC, IRON, RETICCTPCT in the last 72 hours.  Sepsis Labs: No results for input(s): PROCALCITON, LATICACIDVEN in the last 168 hours.  Recent Results (from the past 240 hour(s))  Culture, sputum-assessment     Status: None   Collection Time: 12/30/15  9:29 PM  Result Value Ref Range Status   Specimen Description SPUTUM  Final   Special Requests NONE  Final   Sputum evaluation   Final    THIS SPECIMEN IS ACCEPTABLE. RESPIRATORY CULTURE REPORT TO FOLLOW.    Report Status 12/30/2015 FINAL  Final         Radiology Studies: Dg Chest 2 View  12/29/2015  CLINICAL DATA:  Shortness of breath.  COPD exacerbation. EXAM: CHEST  2 VIEW COMPARISON:  08/12/2015 chest radiograph. FINDINGS: Stable cardiomediastinal silhouette with normal heart size. No pneumothorax. No pleural effusion. Emphysema and hyperinflated lungs. No pulmonary edema. Mild hazy opacity in the right lower lobe appears new. IMPRESSION: 1. Mild hazy right lower lobe opacity appears new and suggests right lower lobe pneumonia or aspiration. Recommend follow-up PA and lateral post treatment chest radiographs in 4-6 weeks. 2. Emphysema and hyperinflated lungs, consistent with the provided history of COPD. Electronically Signed   By: Ilona Sorrel M.D.   On: 12/29/2015 21:08        Scheduled Meds: . amLODipine  10 mg Oral Daily  . aspirin EC  81 mg Oral Daily  . azithromycin  500 mg Intravenous Q24H  . cefTRIAXone (ROCEPHIN)  IV  1 g Intravenous Q24H  . enoxaparin (LOVENOX) injection  30 mg Subcutaneous Q24H  . feeding supplement (ENSURE ENLIVE)  237 mL Oral TID BM  . furosemide  20 mg Oral BID  . ipratropium-albuterol  3 mL Nebulization Q4H  . isosorbide mononitrate  60 mg Oral Daily  . mometasone-formoterol  2 puff Inhalation BID  . pantoprazole  40 mg Oral Daily  . simvastatin  20 mg Oral Daily  . sodium chloride flush  3 mL Intravenous Q12H  . spironolactone  25 mg Oral Daily   Continuous Infusions:    LOS: 1 day    Time spent: 35 minutes.    [facility], MD [facility] Pager [phone]  If 7PM-7AM, please contact night-coverage [email or web address] Password [email or web address] 12/31/2015, 1:59 PM

## 2015-12-31 NOTE — Evaluation (Signed)
Physical Therapy Evaluation Patient Details Name: Cristian Collins MRN: [id number] DOB: Nov 20, 1929 Today's Date: 12/31/2015   History of Present Illness  Pt is a 82 y/o M who presented to the ED w/ progressively worsening cough and dyspnea. Found to have community-acquired RLL streptococcal pneumonia.  Pt's PMh includes COPD, chest pain, anemia, prostate cancer, CAD.     Clinical Impression  Pt admitted with above diagnosis. Pt currently with functional limitations due to the deficits listed below (see PT Problem List). Cristian Collins presents w/ balance and strength impairments.  Pt will have 24/7 assist/supervision from his wife at d/c. Pt will benefit from skilled PT to increase their independence and safety with mobility to allow discharge to the venue listed below.      Follow Up Recommendations Home health PT;Supervision for mobility/OOB    Equipment Recommendations  None recommended by PT    Recommendations for Other Services OT consult     Precautions / Restrictions Precautions Precautions: Fall Precaution Comments: monitor O2 Restrictions Weight Bearing Restrictions: No      Mobility  Bed Mobility               General bed mobility comments: Pt sitting EOB upon PT arrival  Transfers Overall transfer level: Needs assistance Equipment used: Straight cane Transfers: Sit to/from Stand Sit to Stand: Min assist         General transfer comment: Assist to steady as pt w/ LOB x1 upon standing.    Ambulation/Gait Ambulation/Gait assistance: Min assist Ambulation Distance (Feet): 95 Feet Assistive device: Straight cane Gait Pattern/deviations: Step-through pattern;Decreased stride length;Trunk flexed   Gait velocity interpretation: Below normal speed for age/gender General Gait Details: Pt unsteady and requires min assist at times to steady.  SpO2 remains at or above 93% on 2L O2.    Stairs            Wheelchair Mobility    Modified Rankin (Stroke  Patients Only)       Balance Overall balance assessment: Needs assistance Sitting-balance support: No upper extremity supported;Feet supported Sitting balance-Leahy Scale: Good     Standing balance support: No upper extremity supported;During functional activity Standing balance-Leahy Scale: Poor Standing balance comment: cane for support                              Pertinent Vitals/Pain Pain Assessment: No/denies pain    Home Living Family/patient expects to be discharged to:: Private residence Living Arrangements: Spouse/significant other Available Help at Discharge: Family;Available 24 hours/day Type of Home: House Home Access: Stairs to enter Entrance Stairs-Rails: None Entrance Stairs-Number of Steps: 1 + 2 no rails Home Layout: One level Home Equipment: Walker - 2 wheels;Cane - single point;Bedside commode;Shower seat      Prior Function Level of Independence: Needs assistance   Gait / Transfers Assistance Needed: Uses cane more than RW.  RW at times outside of home.  ADL's / Homemaking Assistance Needed: Wife assists w/ washing up back        Hand Dominance   Dominant Hand: Right    Extremity/Trunk Assessment   Upper Extremity Assessment: Generalized weakness           Lower Extremity Assessment: Generalized weakness      Cervical / Trunk Assessment: Kyphotic  Communication   Communication: No difficulties  Cognition Arousal/Alertness: Awake/alert Behavior During Therapy: WFL for tasks assessed/performed Overall Cognitive Status: Within Functional Limits for tasks assessed  General Comments      Exercises General Exercises - Lower Extremity Ankle Circles/Pumps: AROM;Both;10 reps;Seated Other Exercises Other Exercises: Encouraged pt to ambualte 3x/day w/ nursing staff      Assessment/Plan    PT Assessment Patient needs continued PT services  PT Diagnosis Generalized weakness;Difficulty  walking   PT Problem List Decreased strength;Decreased activity tolerance;Decreased balance;Decreased knowledge of use of DME;Decreased safety awareness;Cardiopulmonary status limiting activity  PT Treatment Interventions DME instruction;Gait training;Stair training;Functional mobility training;Therapeutic activities;Therapeutic exercise;Balance training;Patient/family education   PT Goals (Current goals can be found in the Care Plan section) Acute Rehab PT Goals Patient Stated Goal: to go home PT Goal Formulation: With patient Time For Goal Achievement: 01/14/16 Potential to Achieve Goals: Good    Frequency Min 3X/week   Barriers to discharge Inaccessible home environment steps to enter home    Co-evaluation               End of Session Equipment Utilized During Treatment: Gait belt;Oxygen Activity Tolerance: Patient tolerated treatment well;Patient limited by fatigue Patient left: in chair;with call bell/phone within reach;with chair alarm set Nurse Communication: Mobility status;Other (comment) (SpO2)         Time: [id number] PT Time Calculation (min) (ACUTE ONLY): 24 min   Charges:   PT Evaluation $PT Eval Low Complexity: 1 Procedure PT Treatments $Gait Training: 8-22 mins   PT G Codes:       Collie Siad PT, DPT  Pager: [phone] Phone: [phone] 12/31/2015, 3:38 PM

## 2016-01-01 DIAGNOSIS — J439 Emphysema, unspecified: Secondary | ICD-10-CM

## 2016-01-01 LAB — LEGIONELLA PNEUMOPHILA SEROGP 1 UR AG: L. PNEUMOPHILA SEROGP 1 UR AG: NEGATIVE

## 2016-01-01 MED ORDER — AZITHROMYCIN 500 MG PO TABS
500.0000 mg | ORAL_TABLET | Freq: Once | ORAL | Status: AC
  Administered 2016-01-01: 500 mg via ORAL
  Filled 2016-01-01: qty 1

## 2016-01-01 MED ORDER — CETYLPYRIDINIUM CHLORIDE 0.05 % MT LIQD
7.0000 mL | Freq: Two times a day (BID) | OROMUCOSAL | Status: DC
  Administered 2016-01-01 – 2016-01-10 (×16): 7 mL via OROMUCOSAL

## 2016-01-01 NOTE — Consult Note (Addendum)
   [facility] CM Inpatient Consult   01/01/2016  JERSEY ROMEL [birth year] [id number]  Patient evaluated for community based chronic disease management services with [facility] as a benefit of patient's [facility]. Spoke with patient at bedside to explain [place] [facility] services. Patient states he is active with [facility] and he has his home oxygen with them.  Consent form signed.  Dr. Glendale Chard as the primary care provider.   Patient will receive post hospital discharge call and will be evaluated for monthly home visits for assessments and disease process education.  Left contact information and [facility] literature at bedside. Made Inpatient Case Manager aware that [place] [facility] following. Of note, [facility] Management services does not replace or interfere with any services that are arranged by inpatient case management or social work.  For additional questions or referrals please contact:    Natividad Brood, RN BSN [facility] Liaison  [phone] business mobile phone Toll free office [phone]

## 2016-01-01 NOTE — Care Management Note (Addendum)
Case Management Note  Patient Details  Name: Cristian Collins MRN: [id number] Date of Birth: 27-Jul-1930  Subjective/Objective:                 Admitted with PNA,  hx of  COPD, chest pain, anemia, prostate cancer, CAD, home oxygen. Lives with wife and son. Uses cane with ambulation. PCP: [facility].    Action/Plan:  Return to home with home health services when medically stable. CM to f/u with disposition needs. [facility] referral made.  Expected Discharge Date:                  Expected Discharge Plan:  [place] (Resides with wife and son)  In-House Referral:     Discharge planning Services     Post Acute Care Choice:  Resumption of Svcs/PTA Provider, Durable Medical Equipment Choice offered to:  Patient  DME Arranged:  Oxygen DME Agency:  [place]:  PT [place]:  [place]  Status of Service:  In process, will continue to follow  Medicare Important Message Given:   Date Medicare IM Given:    Medicare IM give by:    Date Additional Medicare IM Given:    Additional Medicare Important Message give by:     If discussed at [place] of Stay Meetings, dates discussed:    Additional Comments:  Sharin Mons, [place] [phone] 01/01/2016, 2:19 PM

## 2016-01-01 NOTE — Progress Notes (Signed)
PROGRESS NOTE  Cristian Collins  [id number] DOB: Feb 16, 1930  DOA: 12/29/2015 PCP: [name], MD   Brief Narrative:  82 year old male, married, lives with spouse and ambulates with the help of a cane/walker, not on home oxygen, PMH of COPD, HTN, HLD, CAD, chronic chest pain, chronic diastolic CHF, GERD, former smoker, presented to [facility] ED on 12/29/15 with progressively worsening cough and dyspnea of 4 days' duration. He gives chronic, several months history of left sided chest pain which seems atypical-his PCP/cardiologists are aware. Seen by his PCP for itchy rash which is now resolving and was referred to a dermatologist. Admitted for pneumonia.   Assessment & Plan:   Principal Problem:   Pneumonia Active Problems:   COPD exacerbation ([place])   Essential hypertension   CAD (coronary artery disease)   Anemia, iron deficiency   Community acquired pneumonia   Community-acquired RLL streptococcal pneumonia - Continue empirically started IV Rocephin and azithromycin. - Urine streptococcal antigen positive.? DC azithromycin - Recommend repeating chest x-ray in 3-4 weeks to ensure resolution of pneumonia findings. - Clinically improving. Sputum culture: Candida  Severe COPD - Intermittent wheezing. Continue oxygen, when necessary bronchodilator nebulizations. Antibiotics as above. No steroids for now. - Stable today.  Atypical chest pain/chronic chest pain syndrome - Apparently has had it for several months. He states that his PCP and primary cardiologist (Dr. [name]) are aware. One of 3 troponins minimally elevated but the third and last 1 was negative. EKG without acute findings. - Discussed with primary cardiologist: Patient has chronic chest pain syndrome. Cardiac cath had shown patent RCA stent and no other abnormalities. - Continue aspirin, statins and nitrates.  Essential hypertension Controlled   Chronic diastolic CHF - Compensated. LVEF of January 2017: 65-70 percent.  Continue home dose of Lasix & Aldactone.  Hyperlipidemia  - Statins  Chronic anemia - Stable.  GERD - PPI   DVT prophylaxis: Lovenox Code Status: Full Family Communication: Discussed with patient. No family at bedside.  Disposition Plan: DC home when medically stable, possibly 5/22..   Consultants:   None   Procedures:   None   Antimicrobials:   IV Rocephin  Azithromycin    Subjective: Dyspnea improved but not yet at baseline. No cough or chest pain reported this morning. As per RN, no acute issues and IV team able to find PIV (PICC line canceled)  Objective:  Filed Vitals:   01/01/16 0445 01/01/16 0448 01/01/16 0834 01/01/16 1133  BP:  138/52    Pulse:  68    Temp:  97.7 F (36.5 C)    TempSrc:      Resp:  18    Height:      Weight: 43.636 kg (96 lb 3.2 oz)     SpO2:  100% 98% 98%    Intake/Output Summary (Last 24 hours) at 01/01/16 1304 Last data filed at 01/01/16 1112  Gross per 24 hour  Intake   1160 ml  Output   1400 ml  Net   -240 ml   Filed Weights   12/30/15 0519 12/31/15 0509 01/01/16 0445  Weight: 45.7 kg (100 lb 12 oz) 45.859 kg (101 lb 1.6 oz) 43.636 kg (96 lb 3.2 oz)    Examination:  General exam: Pleasant elderly male, moderately built, frail, lying comfortably propped up in bed. Respiratory system: Distant breath sounds but clear to auscultation. Respiratory effort normal. Cardiovascular system: S1 & S2 heard, RRR. No JVD, murmurs, rubs, gallops or clicks. No pedal edema. Gastrointestinal system: Abdomen is  nondistended, soft and nontender. No organomegaly or masses felt. Normal bowel sounds heard. Central nervous system: Alert and oriented. No focal neurological deficits. Extremities: Symmetric 5 x 5 power. Skin: some fading rash on left leg and hypopigmented skin lesions on forearms.  Psychiatry: Judgement and insight appear normal. Mood & affect appropriate.     Data Reviewed: I have personally reviewed following labs and  imaging studies  CBC:  Recent Labs Lab 12/29/15 2136 12/30/15 0309 12/30/15 0648  WBC 11.5* 12.9* 11.6*  NEUTROABS 10.5*  --  11.1*  HGB 9.2* 8.7* 9.1*  HCT 28.1* 27.1* 27.6*  MCV 84.4 84.7 84.4  PLT 208 172 0000000   Basic Metabolic Panel:  Recent Labs Lab 12/29/15 2136 12/30/15 0309 12/30/15 0648  NA 139  --  138  K 4.0  --  4.1  CL 104  --  98*  CO2 25  --  28  GLUCOSE 131*  --  172*  BUN 13  --  16  CREATININE 1.01 1.03 1.07  CALCIUM 8.8*  --  8.9   GFR: Estimated Creatinine Clearance: 32.3 mL/min (by C-G formula based on Cr of 1.07). Liver Function Tests:  Recent Labs Lab 12/30/15 0648  AST 28  ALT 15*  ALKPHOS 79  BILITOT 0.3  PROT 5.4*  ALBUMIN 2.8*   No results for input(s): LIPASE, AMYLASE in the last 168 hours. No results for input(s): AMMONIA in the last 168 hours. Coagulation Profile: No results for input(s): INR, PROTIME in the last 168 hours. Cardiac Enzymes:  Recent Labs Lab 12/30/15 0309 12/30/15 0648 12/30/15 1209  TROPONINI <0.03 0.05* 0.03   BNP (last 3 results) No results for input(s): PROBNP in the last 8760 hours. HbA1C: No results for input(s): HGBA1C in the last 72 hours. CBG: No results for input(s): GLUCAP in the last 168 hours. Lipid Profile: No results for input(s): CHOL, HDL, LDLCALC, TRIG, CHOLHDL, LDLDIRECT in the last 72 hours. Thyroid Function Tests: No results for input(s): TSH, T4TOTAL, FREET4, T3FREE, THYROIDAB in the last 72 hours. Anemia Panel: No results for input(s): VITAMINB12, FOLATE, FERRITIN, TIBC, IRON, RETICCTPCT in the last 72 hours.  Sepsis Labs: No results for input(s): PROCALCITON, LATICACIDVEN in the last 168 hours.  Recent Results (from the past 240 hour(s))  Culture, sputum-assessment     Status: None   Collection Time: 12/30/15  9:29 PM  Result Value Ref Range Status   Specimen Description SPUTUM  Final   Special Requests NONE  Final   Sputum evaluation   Final    THIS SPECIMEN IS  ACCEPTABLE. RESPIRATORY CULTURE REPORT TO FOLLOW.   Report Status 12/30/2015 FINAL  Final  Culture, respiratory (NON-Expectorated)     Status: None (Preliminary result)   Collection Time: 12/30/15  9:29 PM  Result Value Ref Range Status   Specimen Description SPUTUM  Final   Special Requests NONE  Final   Gram Stain PENDING  Incomplete   Culture   Final    MODERATE YEAST CONSISTENT WITH CANDIDA SPECIES Performed at [facility]    Report Status PENDING  Incomplete         Radiology Studies: No results found.      Scheduled Meds: . amLODipine  10 mg Oral Daily  . antiseptic oral rinse  7 mL Mouth Rinse BID  . aspirin EC  81 mg Oral Daily  . azithromycin  500 mg Intravenous Q24H  . cefTRIAXone (ROCEPHIN)  IV  2 g Intravenous Q24H  . enoxaparin (LOVENOX) injection  30 mg Subcutaneous Q24H  . feeding supplement (ENSURE ENLIVE)  237 mL Oral TID BM  . furosemide  20 mg Oral BID  . ipratropium-albuterol  3 mL Nebulization QID  . isosorbide mononitrate  60 mg Oral Daily  . mometasone-formoterol  2 puff Inhalation BID  . pantoprazole  40 mg Oral Daily  . simvastatin  20 mg Oral Daily  . sodium chloride flush  3 mL Intravenous Q12H  . spironolactone  25 mg Oral Daily   Continuous Infusions:    LOS: 2 days    Time spent: 15 minutes.    [facility], MD [facility] Pager [phone]  If 7PM-7AM, please contact night-coverage [email or web address] Password [email or web address] 01/01/2016, 1:04 PM

## 2016-01-01 NOTE — Clinical Documentation Improvement (Signed)
Internal Medicine  Based on the clinical findings below, please document any associated diagnoses/conditions the patient has or may have in next progress note. Thank you!.   Malnutrition - if agree, please state severity of: severe, moderate, mild  Weight Loss  Underweight  Cachexia  Other Condition  Clinically Undetermined  Supporting Information:  Patient is 5'6" tall weighing 100 pounds; BMI is 16  Being supplemented with Ensure Complete TID  Please exercise your independent, professional judgment when responding. A specific answer is not anticipated or expected.  Thank You, [place] [phone]

## 2016-01-01 NOTE — Progress Notes (Signed)
Peripheral IV infiltrated after receiving antibiotic. IV team paged and IV RN was unable to obtain PIV access. Fredirick Maudlin, NP paged. Per Rogue Bussing, okay to leave access out overnight-will place order for PICC placement. Will continue to monitor.

## 2016-01-01 NOTE — Progress Notes (Signed)
UR COMPLETED  

## 2016-01-01 NOTE — Care Management Important Message (Signed)
Important Message  Patient Details  Name: Cristian Collins MRN: [id number] Date of Birth: 1930/06/21   Medicare Important Message Given:  Yes    Sharin Mons, RN 01/01/2016, 11:43 AM

## 2016-01-02 LAB — CULTURE, RESPIRATORY W GRAM STAIN

## 2016-01-02 MED ORDER — METHYLPREDNISOLONE SODIUM SUCC 40 MG IJ SOLR
40.0000 mg | Freq: Two times a day (BID) | INTRAMUSCULAR | Status: DC
  Administered 2016-01-02 – 2016-01-03 (×3): 40 mg via INTRAVENOUS
  Filled 2016-01-02 (×3): qty 1

## 2016-01-02 NOTE — Progress Notes (Signed)
Physical Therapy Treatment Patient Details Name: Cristian Collins MRN: [id number] DOB: 10-04-1929 Today's Date: 05-Jan-2016    History of Present Illness Pt is a 82 y/o M who presented to the ED w/ progressively worsening cough and dyspnea. Found to have community-acquired RLL streptococcal pneumonia.  Pt's PMh includes COPD, chest pain, anemia, prostate cancer, CAD.     PT Comments    Patient progressing with mobility and strength.  Needed encouragement for participation this session.  Still fatigues with short distance ambulation so will continue to benefit from skilled PT in the acute setting to allow d/c home with family support.   Follow Up Recommendations  Home health PT;Supervision for mobility/OOB     Equipment Recommendations  None recommended by PT    Recommendations for Other Services       Precautions / Restrictions Precautions Precautions: Fall Precaution Comments: monitor O2    Mobility  Bed Mobility Overal bed mobility: Modified Independent                Transfers Overall transfer level: Needs assistance Equipment used: Straight cane   Sit to Stand: Min guard         General transfer comment: assist for balance  Ambulation/Gait Ambulation/Gait assistance: Min guard;Supervision Ambulation Distance (Feet): 100 Feet Assistive device: Straight cane Gait Pattern/deviations: Decreased stride length;Shuffle;Trunk flexed;Step-through pattern     General Gait Details: maintained on 2L O2 with SpO2 96%; note increased shuffling pattern with distance   Stairs            Wheelchair Mobility    Modified Rankin (Stroke Patients Only)       Balance Overall balance assessment: Needs assistance         Standing balance support: No upper extremity supported Standing balance-Leahy Scale: Poor Standing balance comment: mild instability standing with cane                    Cognition Arousal/Alertness: Awake/alert Behavior During  Therapy: WFL for tasks assessed/performed Overall Cognitive Status: Within Functional Limits for tasks assessed                      Exercises      General Comments        Pertinent Vitals/Pain Pain Assessment: No/denies pain    Home Living                      Prior Function            PT Goals (current goals can now be found in the care plan section) Progress towards PT goals: Progressing toward goals    Frequency  Min 3X/week    PT Plan Current plan remains appropriate    Co-evaluation             End of Session Equipment Utilized During Treatment: Gait belt;Oxygen Activity Tolerance: Patient limited by fatigue Patient left: with call bell/phone within reach;in chair;with chair alarm set     Time: 1224-1240 PT Time Calculation (min) (ACUTE ONLY): 16 min  Charges:  $Gait Training: 8-22 mins                    G Codes:      [name] 2016/01/05, 1:45 PM  [name], [place] Jan 05, 2016

## 2016-01-02 NOTE — Progress Notes (Signed)
RN informed pt that pt is 2L dependent at home. MD verbally told RN O2sat at ambulation not needed on pt. Cristian Collins 01/02/2016 8:51 AM

## 2016-01-02 NOTE — Progress Notes (Signed)
PROGRESS NOTE  Cristian Collins  [id number] DOB: [birth year]  DOA: 12/29/2015 PCP: [name], MD   Brief Narrative:  82 year old male, married, lives with spouse and ambulates with the help of a cane/walker, not on home oxygen, PMH of COPD, HTN, HLD, CAD, chronic chest pain, chronic diastolic CHF, GERD, former smoker, presented to [facility] ED on 12/29/15 with progressively worsening cough and dyspnea of 4 days' duration. He gives chronic, several months history of left sided chest pain which seems atypical-his PCP/cardiologists are aware. Seen by his PCP for itchy rash which is now resolving and was referred to a dermatologist. Admitted for pneumonia-improving. COPD exacerbation on 5/23.   Assessment & Plan:   Principal Problem:   Pneumonia Active Problems:   COPD exacerbation ([place])   Essential hypertension   CAD (coronary artery disease)   Anemia, iron deficiency   Community acquired pneumonia   Community-acquired RLL streptococcal pneumonia - empirically started IV Rocephin and azithromycin. - Urine streptococcal antigen positive. Urine Legionella: Negative. DC azithromycin - Recommend repeating chest x-ray in 3-4 weeks to ensure resolution of pneumonia findings. - Clinically improving. Sputum culture: Candida  Severe COPD with exacerbation - Continue oxygen, when necessary bronchodilator nebulizations. Antibiotics as above.  - Patient wheezing more today than prior. Also breathing not yet at baseline. Add IV Solu-Medrol.  Atypical chest pain/chronic chest pain syndrome - Apparently has had it for several months. He states that his PCP and primary cardiologist (Dr. [name]) are aware. One of 3 troponins minimally elevated but the third and last 1 was negative. EKG without acute findings. - Discussed with primary cardiologist: Patient has chronic chest pain syndrome. Cardiac cath had shown patent RCA stent and no other abnormalities. - Continue aspirin, statins and  nitrates.  Essential hypertension Controlled   Chronic diastolic CHF - Compensated. LVEF of January 2017: 65-70 percent. Continue home dose of Lasix & Aldactone.  Hyperlipidemia  - Statins  Chronic anemia - Stable.  GERD - PPI  Malnutrition - Requested dietitian input to assess severity and intervention.   DVT prophylaxis: Lovenox Code Status: Full Family Communication: Discussed with patient. No family at bedside.  Disposition Plan: DC home when medically stable.   Consultants:   None   Procedures:   None   Antimicrobials:   IV Rocephin  Azithromycin -discontinued   Subjective: Dyspnea improved but not yet at baseline. Intermittent wheezing. Not much coughing. As per RN, no acute issues.  Objective:  Filed Vitals:   01/02/16 0408 01/02/16 0912 01/02/16 0940 01/02/16 1150  BP: 119/58 120/56    Pulse: 76     Temp: 98.1 F (36.7 C)     TempSrc: Oral     Resp: 16     Height:      Weight:      SpO2: 100%  98% 96%    Intake/Output Summary (Last 24 hours) at 01/02/16 1439 Last data filed at 01/02/16 0843  Gross per 24 hour  Intake    240 ml  Output   2510 ml  Net  -2270 ml   Filed Weights   12/31/15 0509 01/01/16 0445 01/02/16 0406  Weight: 45.859 kg (101 lb 1.6 oz) 43.636 kg (96 lb 3.2 oz) 44.316 kg (97 lb 11.2 oz)    Examination:  General exam: Pleasant elderly male, moderately built, frail, sitting up comfortably in bed. Respiratory system: Reduced breath sounds bilaterally with scattered few bilateral expiratory rhonchi. Respiratory effort normal. Cardiovascular system: S1 & S2 heard, RRR. No JVD, murmurs, rubs, gallops  or clicks. No pedal edema. Gastrointestinal system: Abdomen is nondistended, soft and nontender. No organomegaly or masses felt. Normal bowel sounds heard. Central nervous system: Alert and oriented. No focal neurological deficits. Extremities: Symmetric 5 x 5 power. Skin: some fading rash on left leg and hypopigmented skin  lesions on forearms.  Psychiatry: Judgement and insight appear normal. Mood & affect appropriate.     Data Reviewed: I have personally reviewed following labs and imaging studies  CBC:  Recent Labs Lab 12/29/15 2136 12/30/15 0309 12/30/15 0648  WBC 11.5* 12.9* 11.6*  NEUTROABS 10.5*  --  11.1*  HGB 9.2* 8.7* 9.1*  HCT 28.1* 27.1* 27.6*  MCV 84.4 84.7 84.4  PLT 208 172 0000000   Basic Metabolic Panel:  Recent Labs Lab 12/29/15 2136 12/30/15 0309 12/30/15 0648  NA 139  --  138  K 4.0  --  4.1  CL 104  --  98*  CO2 25  --  28  GLUCOSE 131*  --  172*  BUN 13  --  16  CREATININE 1.01 1.03 1.07  CALCIUM 8.8*  --  8.9   GFR: Estimated Creatinine Clearance: 32.8 mL/min (by C-G formula based on Cr of 1.07). Liver Function Tests:  Recent Labs Lab 12/30/15 0648  AST 28  ALT 15*  ALKPHOS 79  BILITOT 0.3  PROT 5.4*  ALBUMIN 2.8*   No results for input(s): LIPASE, AMYLASE in the last 168 hours. No results for input(s): AMMONIA in the last 168 hours. Coagulation Profile: No results for input(s): INR, PROTIME in the last 168 hours. Cardiac Enzymes:  Recent Labs Lab 12/30/15 0309 12/30/15 0648 12/30/15 1209  TROPONINI <0.03 0.05* 0.03   BNP (last 3 results) No results for input(s): PROBNP in the last 8760 hours. HbA1C: No results for input(s): HGBA1C in the last 72 hours. CBG: No results for input(s): GLUCAP in the last 168 hours. Lipid Profile: No results for input(s): CHOL, HDL, LDLCALC, TRIG, CHOLHDL, LDLDIRECT in the last 72 hours. Thyroid Function Tests: No results for input(s): TSH, T4TOTAL, FREET4, T3FREE, THYROIDAB in the last 72 hours. Anemia Panel: No results for input(s): VITAMINB12, FOLATE, FERRITIN, TIBC, IRON, RETICCTPCT in the last 72 hours.  Sepsis Labs: No results for input(s): PROCALCITON, LATICACIDVEN in the last 168 hours.  Recent Results (from the past 240 hour(s))  Culture, sputum-assessment     Status: None   Collection Time:  12/30/15  9:29 PM  Result Value Ref Range Status   Specimen Description SPUTUM  Final   Special Requests NONE  Final   Sputum evaluation   Final    THIS SPECIMEN IS ACCEPTABLE. RESPIRATORY CULTURE REPORT TO FOLLOW.   Report Status 12/30/2015 FINAL  Final  Culture, respiratory (NON-Expectorated)     Status: None   Collection Time: 12/30/15  9:29 PM  Result Value Ref Range Status   Specimen Description SPUTUM  Final   Special Requests NONE  Final   Gram Stain   Final    FEW WBC PRESENT, PREDOMINANTLY PMN FEW SQUAMOUS EPITHELIAL CELLS PRESENT FEW GRAM POSITIVE COCCI IN PAIRS FEW YEAST Performed at [facility]    Culture   Final    MODERATE YEAST CONSISTENT WITH CANDIDA SPECIES Performed at [facility]    Report Status 01/02/2016 FINAL  Final         Radiology Studies: No results found.      Scheduled Meds: . amLODipine  10 mg Oral Daily  . antiseptic oral rinse  7 mL Mouth  Rinse BID  . aspirin EC  81 mg Oral Daily  . cefTRIAXone (ROCEPHIN)  IV  2 g Intravenous Q24H  . enoxaparin (LOVENOX) injection  30 mg Subcutaneous Q24H  . feeding supplement (ENSURE ENLIVE)  237 mL Oral TID BM  . furosemide  20 mg Oral BID  . ipratropium-albuterol  3 mL Nebulization QID  . isosorbide mononitrate  60 mg Oral Daily  . methylPREDNISolone (SOLU-MEDROL) injection  40 mg Intravenous BID  . mometasone-formoterol  2 puff Inhalation BID  . pantoprazole  40 mg Oral Daily  . simvastatin  20 mg Oral Daily  . sodium chloride flush  3 mL Intravenous Q12H  . spironolactone  25 mg Oral Daily   Continuous Infusions:    LOS: 3 days    Time spent: 25 minutes.    [facility], MD [facility] Pager [phone]  If 7PM-7AM, please contact night-coverage [email or web address] Password [facility] 01/02/2016, 2:39 PM

## 2016-01-03 ENCOUNTER — Other Ambulatory Visit: Payer: Self-pay

## 2016-01-03 DIAGNOSIS — I10 Essential (primary) hypertension: Secondary | ICD-10-CM

## 2016-01-03 DIAGNOSIS — D509 Iron deficiency anemia, unspecified: Secondary | ICD-10-CM

## 2016-01-03 DIAGNOSIS — I2510 Atherosclerotic heart disease of native coronary artery without angina pectoris: Secondary | ICD-10-CM

## 2016-01-03 MED ORDER — ARFORMOTEROL TARTRATE 15 MCG/2ML IN NEBU
15.0000 ug | INHALATION_SOLUTION | Freq: Two times a day (BID) | RESPIRATORY_TRACT | Status: DC
  Administered 2016-01-03 – 2016-01-10 (×14): 15 ug via RESPIRATORY_TRACT
  Filled 2016-01-03 (×14): qty 2

## 2016-01-03 MED ORDER — ENSURE ENLIVE PO LIQD
237.0000 mL | Freq: Two times a day (BID) | ORAL | Status: DC
  Administered 2016-01-04 – 2016-01-09 (×9): 237 mL via ORAL

## 2016-01-03 MED ORDER — METHYLPREDNISOLONE SODIUM SUCC 125 MG IJ SOLR
60.0000 mg | Freq: Four times a day (QID) | INTRAMUSCULAR | Status: DC
  Administered 2016-01-03 – 2016-01-05 (×8): 60 mg via INTRAVENOUS
  Filled 2016-01-03 (×8): qty 2

## 2016-01-03 MED ORDER — BUDESONIDE 0.25 MG/2ML IN SUSP
0.2500 mg | Freq: Two times a day (BID) | RESPIRATORY_TRACT | Status: DC
  Administered 2016-01-03 – 2016-01-10 (×14): 0.25 mg via RESPIRATORY_TRACT
  Filled 2016-01-03 (×14): qty 2

## 2016-01-03 NOTE — Patient Outreach (Signed)
Chart review for disposition information. Will engage patient within 24-72 hours of discharge.

## 2016-01-03 NOTE — Progress Notes (Signed)
Initial Nutrition Assessment  DOCUMENTATION CODES:   Severe malnutrition in context of chronic illness, Underweight  INTERVENTION:   -Decrease Ensure Enlive po to BID, each supplement provides 350 kcal and 20 grams of protein  NUTRITION DIAGNOSIS:   Malnutrition related to chronic illness as evidenced by severe depletion of body fat, severe depletion of muscle mass.  GOAL:   Patient will meet greater than or equal to 90% of their needs   MONITOR:   PO intake, Supplement acceptance, Labs, Weight trends, Skin, I & O's  REASON FOR ASSESSMENT:   Consult Assessment of nutrition requirement/status  ASSESSMENT:   Cristian Collins is a 82 y.o. male with medical history significant of COPD, CAD status post stenting, hypertension, CHF, chronic anemia presents to the ER because of shortness of breath. Patient has been having shortness of breath over the last 4 days which has been gradually worsening with productive cough.  Pt admitted with pneumonia.   Spoke with pt at bedside. He reports weight loss over the past several years, related to increased work of breathing and poor appetite. He reports his appetite has improved since admission, however, was poor due for the past month PTA. He generally consumes 3 meals per day ("my wife makes the food like the hospital food"). He also reports he consumes 1-2 Ensure supplements daily (on after breakfast and one q HS).   Pt reveals UBW is around 135#. Per wt hx, pt has been around 100-110# over the past 3 years.   Nutrition-Focused physical exam completed. Findings are severe fat depletion, severe muscle depletion, and no edema.   Discussed importance of good meal and supplement intake to promote healing. Also discussed ways pt could increase protein in his diet.  Case discussed with RN. She reveals pt with improving appetite and sips on Ensure supplements daily. He typically consumes about one bottle per day.   Labs reviewed.   Diet  Order:  Diet Heart Room service appropriate?: Yes; Fluid consistency:: Thin; Fluid restriction:: 1200 mL Fluid  Skin:  Reviewed, no issues  Last BM:  01/02/16  Height:   Ht Readings from Last 1 Encounters:  12/29/15 5\' 6"  (1.676 m)    Weight:   Wt Readings from Last 1 Encounters:  01/03/16 94 lb 9.2 oz (42.9 kg)    Ideal Body Weight:  64.5 kg  BMI:  Body mass index is 15.27 kg/(m^2).  Estimated Nutritional Needs:   Kcal:  1500-1700  Protein:  70-85 grams  Fluid:  1.5-1.7 L  EDUCATION NEEDS:   Education needs addressed  Stepfanie Yott A. Jimmye Norman, RD, LDN, CDE Pager: [phone] After hours Pager: [phone]

## 2016-01-03 NOTE — Progress Notes (Signed)
UR COMPLETED  

## 2016-01-03 NOTE — Progress Notes (Signed)
[facility]                                                                              Patient Demographics  Cristian Collins, is a 82 y.o. male, DOB - 1929/10/09, [id number]  Admit date - 12/29/2015   Admitting Physician [name], MD  Outpatient Primary MD for the patient is [name], MD  Outpatient specialists:   LOS - 4  days    Chief Complaint  Patient presents with  . Shortness of Breath       Brief summary   82 year old male, married, lives with spouse and ambulates with the help of a cane/walker, not on home oxygen, PMH of COPD, HTN, HLD, CAD, chronic chest pain, chronic diastolic CHF, GERD, former smoker, presented to [facility] ED on 12/29/15 with progressively worsening cough and dyspnea of 4 days' duration. He gives chronic, several months history of left sided chest pain which seems atypical-his PCP/cardiologists are aware. Seen by his PCP for itchy rash which is now resolving and was referred to a dermatologist. Admitted for pneumonia-improving. COPD exacerbation on 5/23.   Assessment & Plan    Community-acquired RLL streptococcal pneumonia - empirically started IV Rocephin and azithromycin. - Urine strept antigen positive. Urine Legionella: Negative. DC azithromycin - Recommend repeating chest x-ray in 3-4 weeks to ensure resolution of pneumonia findings. -  Sputum culture: Candida  Severe COPD with exacerbationWith chronic respiratory failure on home O2 2 L - Diffusely wheezing - Placed on Brovana, Pulmicort, scheduled nebs, flutter valve, increase Solu-Medrol  Atypical chest pain/chronic chest pain syndrome - Apparently has had it for several months. He states that his PCP and primary cardiologist (Dr. [name]) are aware. One of 3 troponins minimally elevated but the third and last 1 was negative. EKG without acute findings. - [name] discussed with patient's primary cardiologist. Patient has chronic chest pain syndrome.  Cardiac cath had shown patent RCA stent and no other abnormalities. - Continue aspirin, statins and nitrates.  Essential hypertension Controlled   Chronic diastolic CHF - Compensated. LVEF of January 2017: 65-70 percent. Continue home dose of Lasix & Aldactone.  Hyperlipidemia  - Statins  Chronic anemia - Stable.  GERD - PPI  Moderate protein calorie Malnutrition - Continue nutritional supplements   Code Status: Full code   DVT Prophylaxis:  Lovenox    Family Communication: Discussed in detail with the patient, all imaging results, lab results explained to the patient   Disposition Plan:  Time Spent in minutes 25 minutes  Procedures:  None  Consultants:   None  Antimicrobials:   IV Rocephin  Azithromycin -discontinued   Medications  Scheduled Meds: . amLODipine  10 mg Oral Daily  . antiseptic oral rinse  7 mL Mouth Rinse BID  . arformoterol  15 mcg Nebulization BID  . aspirin EC  81 mg Oral Daily  . budesonide (PULMICORT) nebulizer solution  0.25 mg Nebulization BID  . cefTRIAXone (ROCEPHIN)  IV  2 g Intravenous Q24H  . enoxaparin (LOVENOX) injection  30 mg Subcutaneous Q24H  . feeding supplement (ENSURE ENLIVE)  237 mL Oral TID BM  . furosemide  20  mg Oral BID  . ipratropium-albuterol  3 mL Nebulization QID  . isosorbide mononitrate  60 mg Oral Daily  . methylPREDNISolone (SOLU-MEDROL) injection  40 mg Intravenous BID  . pantoprazole  40 mg Oral Daily  . simvastatin  20 mg Oral Daily  . sodium chloride flush  3 mL Intravenous Q12H  . spironolactone  25 mg Oral Daily   Continuous Infusions:  PRN Meds:.acetaminophen **OR** acetaminophen, albuterol, nitroGLYCERIN, ondansetron **OR** ondansetron (ZOFRAN) IV, traMADol   Antibiotics   Anti-infectives    Start     Dose/Rate Route Frequency Ordered Stop   01/01/16 0015  azithromycin (ZITHROMAX) tablet 500 mg     500 mg Oral  Once 01/01/16 0009 01/01/16 0152   12/31/15 2000  cefTRIAXone (ROCEPHIN)  2 g in dextrose 5 % 50 mL IVPB     2 g 100 mL/hr over 30 Minutes Intravenous Every 24 hours 12/31/15 1403 01/06/16 1959   12/30/15 2200  azithromycin (ZITHROMAX) 500 mg in dextrose 5 % 250 mL IVPB  Status:  Discontinued     500 mg 250 mL/hr over 60 Minutes Intravenous Every 24 hours 12/30/15 0044 01/01/16 1631   12/30/15 2000  cefTRIAXone (ROCEPHIN) 1 g in dextrose 5 % 50 mL IVPB  Status:  Discontinued     1 g 100 mL/hr over 30 Minutes Intravenous Every 24 hours 12/30/15 0044 12/31/15 1403   12/29/15 2130  azithromycin (ZITHROMAX) 500 mg in dextrose 5 % 250 mL IVPB     500 mg 250 mL/hr over 60 Minutes Intravenous  Once 12/29/15 2125 12/29/15 2329   12/29/15 2130  cefTRIAXone (ROCEPHIN) 1 g in dextrose 5 % 50 mL IVPB     1 g 100 mL/hr over 30 Minutes Intravenous  Once 12/29/15 2125 12/29/15 2309        Subjective:   [name] was seen and examined today. Still feeling short of breath and wheezing throughout. No chest pain. Patient denies dizziness, chest pain, abdominal pain, N/V/D/C, new weakness, numbess, tingling. No acute events overnight.    Objective:   Filed Vitals:   01/03/16 0533 01/03/16 0756 01/03/16 0856 01/03/16 1154  BP: 143/52  149/57   Pulse: 76     Temp: 97.5 F (36.4 C)     TempSrc:      Resp: 16     Height:      Weight: 42.9 kg (94 lb 9.2 oz)     SpO2: 98% 98%  98%    Intake/Output Summary (Last 24 hours) at 01/03/16 1327 Last data filed at 01/03/16 0814  Gross per 24 hour  Intake    240 ml  Output   1500 ml  Net  -1260 ml     Wt Readings from Last 3 Encounters:  01/03/16 42.9 kg (94 lb 9.2 oz)  08/12/15 46.72 kg (103 lb)  10/24/14 45.088 kg (99 lb 6.4 oz)     Exam  General: Alert and oriented x 3, NAD  HEENT:  PERRLA, EOMI, Anicteric Sclera, mucous membranes moist.   Neck: Supple, no JVD, no masses  Cardiovascular: S1 S2 auscultated, no rubs, murmurs or gallops. Regular rate and rhythm.  Respiratory: Diffuse expiratory wheezing  bilaterally  Gastrointestinal: Soft, nontender, nondistended, + bowel sounds  Ext: no cyanosis clubbing or edema  Neuro: AAOx3, Cr N's II- XII. Strength 5/5 upper and lower extremities bilaterally  Skin: No rashes  Psych: Normal affect and demeanor, alert and oriented x3    Data Reviewed:  I have personally reviewed  following labs and imaging studies  Micro Results Recent Results (from the past 240 hour(s))  Culture, sputum-assessment     Status: None   Collection Time: 12/30/15  9:29 PM  Result Value Ref Range Status   Specimen Description SPUTUM  Final   Special Requests NONE  Final   Sputum evaluation   Final    THIS SPECIMEN IS ACCEPTABLE. RESPIRATORY CULTURE REPORT TO FOLLOW.   Report Status 12/30/2015 FINAL  Final  Culture, respiratory (NON-Expectorated)     Status: None   Collection Time: 12/30/15  9:29 PM  Result Value Ref Range Status   Specimen Description SPUTUM  Final   Special Requests NONE  Final   Gram Stain   Final    FEW WBC PRESENT, PREDOMINANTLY PMN FEW SQUAMOUS EPITHELIAL CELLS PRESENT FEW GRAM POSITIVE COCCI IN PAIRS FEW YEAST Performed at [facility]    Culture   Final    MODERATE YEAST CONSISTENT WITH CANDIDA SPECIES Performed at [facility]    Report Status 01/02/2016 FINAL  Final    Radiology Reports Dg Chest 2 View  12/29/2015  CLINICAL DATA:  Shortness of breath.  COPD exacerbation. EXAM: CHEST  2 VIEW COMPARISON:  08/12/2015 chest radiograph. FINDINGS: Stable cardiomediastinal silhouette with normal heart size. No pneumothorax. No pleural effusion. Emphysema and hyperinflated lungs. No pulmonary edema. Mild hazy opacity in the right lower lobe appears new. IMPRESSION: 1. Mild hazy right lower lobe opacity appears new and suggests right lower lobe pneumonia or aspiration. Recommend follow-up PA and lateral post treatment chest radiographs in 4-6 weeks. 2. Emphysema and hyperinflated lungs, consistent with the provided history  of COPD. Electronically Signed   By: Ilona Sorrel M.D.   On: 12/29/2015 21:08    Lab Data:  CBC:  Recent Labs Lab 12/29/15 2136 12/30/15 0309 12/30/15 0648  WBC 11.5* 12.9* 11.6*  NEUTROABS 10.5*  --  11.1*  HGB 9.2* 8.7* 9.1*  HCT 28.1* 27.1* 27.6*  MCV 84.4 84.7 84.4  PLT 208 172 0000000   Basic Metabolic Panel:  Recent Labs Lab 12/29/15 2136 12/30/15 0309 12/30/15 0648  NA 139  --  138  K 4.0  --  4.1  CL 104  --  98*  CO2 25  --  28  GLUCOSE 131*  --  172*  BUN 13  --  16  CREATININE 1.01 1.03 1.07  CALCIUM 8.8*  --  8.9   GFR: Estimated Creatinine Clearance: 31.7 mL/min (by C-G formula based on Cr of 1.07). Liver Function Tests:  Recent Labs Lab 12/30/15 0648  AST 28  ALT 15*  ALKPHOS 79  BILITOT 0.3  PROT 5.4*  ALBUMIN 2.8*   No results for input(s): LIPASE, AMYLASE in the last 168 hours. No results for input(s): AMMONIA in the last 168 hours. Coagulation Profile: No results for input(s): INR, PROTIME in the last 168 hours. Cardiac Enzymes:  Recent Labs Lab 12/30/15 0309 12/30/15 0648 12/30/15 1209  TROPONINI <0.03 0.05* 0.03   BNP (last 3 results) No results for input(s): PROBNP in the last 8760 hours. HbA1C: No results for input(s): HGBA1C in the last 72 hours. CBG: No results for input(s): GLUCAP in the last 168 hours. Lipid Profile: No results for input(s): CHOL, HDL, LDLCALC, TRIG, CHOLHDL, LDLDIRECT in the last 72 hours. Thyroid Function Tests: No results for input(s): TSH, T4TOTAL, FREET4, T3FREE, THYROIDAB in the last 72 hours. Anemia Panel: No results for input(s): VITAMINB12, FOLATE, FERRITIN, TIBC, IRON, RETICCTPCT in the last 72  hours. Urine analysis:    Component Value Date/Time   COLORURINE YELLOW 08/12/2015 [place] 08/12/2015 2312   LABSPEC 1.010 08/12/2015 2312   PHURINE 5.0 08/12/2015 2312   GLUCOSEU NEGATIVE 08/12/2015 2312   HGBUR NEGATIVE 08/12/2015 2312   BILIRUBINUR NEGATIVE 08/12/2015 2312    KETONESUR NEGATIVE 08/12/2015 2312   PROTEINUR NEGATIVE 08/12/2015 2312   UROBILINOGEN 0.2 10/08/2014 0838   NITRITE NEGATIVE 08/12/2015 2312   LEUKOCYTESUR NEGATIVE 08/12/2015 2312     RAI,RIPUDEEP M.D. [facility] 01/03/2016, 1:27 PM  Pager: [phone] Between 7am to 7pm - call Pager - [phone]  After 7pm go to [email or web address] - password [email or web address]  Call night coverage person covering after 7pm

## 2016-01-03 NOTE — Care Management Important Message (Signed)
Important Message  Patient Details  Name: Cristian Collins MRN: [id number] Date of Birth: 20-Dec-1929   Medicare Important Message Given:  Yes    [name] 01/03/2016, 11:47 AM

## 2016-01-04 NOTE — Progress Notes (Signed)
[facility]                                                                              Patient Demographics  Cristian Collins, is a 82 y.o. male, DOB - October 06, 1929, [id number]  Admit date - 12/29/2015   Admitting Physician [name], MD  Outpatient Primary MD for the patient is [name], MD  Outpatient specialists:   LOS - 5  days    Chief Complaint  Patient presents with  . Shortness of Breath       Brief summary   82 year old male, married, lives with spouse and ambulates with the help of a cane/walker, not on home oxygen, PMH of COPD, HTN, HLD, CAD, chronic chest pain, chronic diastolic CHF, GERD, former smoker, presented to [facility] on 12/29/15 with progressively worsening cough and dyspnea of 4 days' duration. He gives chronic, several months history of left sided chest pain which seems atypical-his PCP/cardiologists are aware. Seen by his PCP for itchy rash which is now resolving and was referred to a dermatologist. Admitted for pneumonia-improving. COPD exacerbation on 5/23.   Assessment & Plan    Community-acquired RLL streptococcal pneumonia - empirically started IV Rocephin and azithromycin. - Urine strept antigen positive. Urine Legionella: Negative. DC azithromycin - Recommend repeating chest x-ray in 3-4 weeks to ensure resolution of pneumonia findings. -  Sputum culture: Candida  Severe COPD with exacerbationWith chronic respiratory failure on home O2 2 L - Wheezing improving, no changes today continue current management - Placed on Brovana, Pulmicort, scheduled nebs, flutter valve, increase Solu-Medrol  Atypical chest pain/chronic chest pain syndrome - Apparently has had it for several months. He states that his PCP and primary cardiologist (Dr. [name]) are aware. One of 3 troponins minimally elevated but the third and last 1 was negative. EKG without acute findings. - [name] discussed with patient's primary cardiologist.  Patient has chronic chest pain syndrome. Cardiac cath had shown patent RCA stent and no other abnormalities. - Continue aspirin, statins and nitrates.  Essential hypertension Controlled   Chronic diastolic CHF - Compensated. LVEF of January 2017: 65-70 percent. Continue home dose of Lasix & Aldactone.  Hyperlipidemia  - Statins  Chronic anemia - Stable.  GERD - PPI  Moderate protein calorie Malnutrition - Continue nutritional supplements   Code Status: Full code   DVT Prophylaxis:  Lovenox    Family Communication: Discussed in detail with the patient, all imaging results, lab results explained to the patient   Disposition Plan:  Time Spent in minutes 25 minutes  Procedures:  None  Consultants:   None  Antimicrobials:   IV Rocephin  Azithromycin -discontinued   Medications  Scheduled Meds: . amLODipine  10 mg Oral Daily  . antiseptic oral rinse  7 mL Mouth Rinse BID  . arformoterol  15 mcg Nebulization BID  . aspirin EC  81 mg Oral Daily  . budesonide (PULMICORT) nebulizer solution  0.25 mg Nebulization BID  . cefTRIAXone (ROCEPHIN)  IV  2 g Intravenous Q24H  . enoxaparin (LOVENOX) injection  30 mg Subcutaneous Q24H  . feeding supplement (ENSURE ENLIVE)  237 mL Oral BID  PC  . furosemide  20 mg Oral BID  . ipratropium-albuterol  3 mL Nebulization QID  . isosorbide mononitrate  60 mg Oral Daily  . methylPREDNISolone (SOLU-MEDROL) injection  60 mg Intravenous Q6H  . pantoprazole  40 mg Oral Daily  . simvastatin  20 mg Oral Daily  . sodium chloride flush  3 mL Intravenous Q12H  . spironolactone  25 mg Oral Daily   Continuous Infusions:  PRN Meds:.acetaminophen **OR** acetaminophen, albuterol, nitroGLYCERIN, ondansetron **OR** ondansetron (ZOFRAN) IV, traMADol   Antibiotics   Anti-infectives    Start     Dose/Rate Route Frequency Ordered Stop   01/01/16 0015  azithromycin (ZITHROMAX) tablet 500 mg     500 mg Oral  Once 01/01/16 0009 01/01/16 0152    12/31/15 2000  cefTRIAXone (ROCEPHIN) 2 g in dextrose 5 % 50 mL IVPB     2 g 100 mL/hr over 30 Minutes Intravenous Every 24 hours 12/31/15 1403 01/06/16 1959   12/30/15 2200  azithromycin (ZITHROMAX) 500 mg in dextrose 5 % 250 mL IVPB  Status:  Discontinued     500 mg 250 mL/hr over 60 Minutes Intravenous Every 24 hours 12/30/15 0044 01/01/16 1631   12/30/15 2000  cefTRIAXone (ROCEPHIN) 1 g in dextrose 5 % 50 mL IVPB  Status:  Discontinued     1 g 100 mL/hr over 30 Minutes Intravenous Every 24 hours 12/30/15 0044 12/31/15 1403   12/29/15 2130  azithromycin (ZITHROMAX) 500 mg in dextrose 5 % 250 mL IVPB     500 mg 250 mL/hr over 60 Minutes Intravenous  Once 12/29/15 2125 12/29/15 2329   12/29/15 2130  cefTRIAXone (ROCEPHIN) 1 g in dextrose 5 % 50 mL IVPB     1 g 100 mL/hr over 30 Minutes Intravenous  Once 12/29/15 2125 12/29/15 2309        Subjective:   Tuff Maturo was seen and examined today. Still significantly wheezing, although improving from yesterday. No fevers or chills. No chest pain. Patient denies dizziness, chest pain, abdominal pain, N/V/D/C, new weakness, numbess, tingling. No acute events overnight.    Objective:   Filed Vitals:   01/03/16 2127 01/04/16 0426 01/04/16 0945 01/04/16 1224  BP: 133/49 131/53    Pulse: 75 66    Temp: 98 F (36.7 C) 97.8 F (36.6 C)    TempSrc:  Oral    Resp: 18 18    Height:      Weight:  43.5 kg (95 lb 14.4 oz)    SpO2: 100% 100% 97% 96%    Intake/Output Summary (Last 24 hours) at 01/04/16 1345 Last data filed at 01/04/16 1011  Gross per 24 hour  Intake    420 ml  Output    400 ml  Net     20 ml     Wt Readings from Last 3 Encounters:  01/04/16 43.5 kg (95 lb 14.4 oz)  08/12/15 46.72 kg (103 lb)  10/24/14 45.088 kg (99 lb 6.4 oz)     Exam  General: Alert and oriented x 3, NAD  HEENT:  .   Neck:   Cardiovascular: S1 S2 Clear, RRR  Respiratory: Diffuse expiratory wheezing bilaterally, Mildly improving  today  Gastrointestinal: Soft, nontender, nondistended, + bowel sounds  Ext: no cyanosis clubbing or edema  Neuro: no new neuro deficits  Skin: No rashes  Psych: Normal affect and demeanor, alert and oriented x3    Data Reviewed:  I have personally reviewed following labs and imaging studies  Micro  Results Recent Results (from the past 240 hour(s))  Culture, sputum-assessment     Status: None   Collection Time: 12/30/15  9:29 PM  Result Value Ref Range Status   Specimen Description SPUTUM  Final   Special Requests NONE  Final   Sputum evaluation   Final    THIS SPECIMEN IS ACCEPTABLE. RESPIRATORY CULTURE REPORT TO FOLLOW.   Report Status 12/30/2015 FINAL  Final  Culture, respiratory (NON-Expectorated)     Status: None   Collection Time: 12/30/15  9:29 PM  Result Value Ref Range Status   Specimen Description SPUTUM  Final   Special Requests NONE  Final   Gram Stain   Final    FEW WBC PRESENT, PREDOMINANTLY PMN FEW SQUAMOUS EPITHELIAL CELLS PRESENT FEW GRAM POSITIVE COCCI IN PAIRS FEW YEAST Performed at [facility]    Culture   Final    MODERATE YEAST CONSISTENT WITH CANDIDA SPECIES Performed at [facility]    Report Status 01/02/2016 FINAL  Final    Radiology Reports Dg Chest 2 View  12/29/2015  CLINICAL DATA:  Shortness of breath.  COPD exacerbation. EXAM: CHEST  2 VIEW COMPARISON:  08/12/2015 chest radiograph. FINDINGS: Stable cardiomediastinal silhouette with normal heart size. No pneumothorax. No pleural effusion. Emphysema and hyperinflated lungs. No pulmonary edema. Mild hazy opacity in the right lower lobe appears new. IMPRESSION: 1. Mild hazy right lower lobe opacity appears new and suggests right lower lobe pneumonia or aspiration. Recommend follow-up PA and lateral post treatment chest radiographs in 4-6 weeks. 2. Emphysema and hyperinflated lungs, consistent with the provided history of COPD. Electronically Signed   By: Ilona Sorrel M.D.   On:  12/29/2015 21:08    Lab Data:  CBC:  Recent Labs Lab 12/29/15 2136 12/30/15 0309 12/30/15 0648  WBC 11.5* 12.9* 11.6*  NEUTROABS 10.5*  --  11.1*  HGB 9.2* 8.7* 9.1*  HCT 28.1* 27.1* 27.6*  MCV 84.4 84.7 84.4  PLT 208 172 0000000   Basic Metabolic Panel:  Recent Labs Lab 12/29/15 2136 12/30/15 0309 12/30/15 0648  NA 139  --  138  K 4.0  --  4.1  CL 104  --  98*  CO2 25  --  28  GLUCOSE 131*  --  172*  BUN 13  --  16  CREATININE 1.01 1.03 1.07  CALCIUM 8.8*  --  8.9   GFR: Estimated Creatinine Clearance: 32.2 mL/min (by C-G formula based on Cr of 1.07). Liver Function Tests:  Recent Labs Lab 12/30/15 0648  AST 28  ALT 15*  ALKPHOS 79  BILITOT 0.3  PROT 5.4*  ALBUMIN 2.8*   No results for input(s): LIPASE, AMYLASE in the last 168 hours. No results for input(s): AMMONIA in the last 168 hours. Coagulation Profile: No results for input(s): INR, PROTIME in the last 168 hours. Cardiac Enzymes:  Recent Labs Lab 12/30/15 0309 12/30/15 0648 12/30/15 1209  TROPONINI <0.03 0.05* 0.03   BNP (last 3 results) No results for input(s): PROBNP in the last 8760 hours. HbA1C: No results for input(s): HGBA1C in the last 72 hours. CBG: No results for input(s): GLUCAP in the last 168 hours. Lipid Profile: No results for input(s): CHOL, HDL, LDLCALC, TRIG, CHOLHDL, LDLDIRECT in the last 72 hours. Thyroid Function Tests: No results for input(s): TSH, T4TOTAL, FREET4, T3FREE, THYROIDAB in the last 72 hours. Anemia Panel: No results for input(s): VITAMINB12, FOLATE, FERRITIN, TIBC, IRON, RETICCTPCT in the last 72 hours. Urine analysis:    Component  Value Date/Time   COLORURINE YELLOW 08/12/2015 [place] 08/12/2015 2312   LABSPEC 1.010 08/12/2015 2312   PHURINE 5.0 08/12/2015 2312   GLUCOSEU NEGATIVE 08/12/2015 2312   HGBUR NEGATIVE 08/12/2015 2312   BILIRUBINUR NEGATIVE 08/12/2015 [place] 08/12/2015 2312   PROTEINUR NEGATIVE  08/12/2015 2312   UROBILINOGEN 0.2 10/08/2014 0838   NITRITE NEGATIVE 08/12/2015 2312   LEUKOCYTESUR NEGATIVE 08/12/2015 2312     Itzae Miralles M.D. [facility] 01/04/2016, 1:45 PM  Pager: [phone] Between 7am to 7pm - call Pager - [phone]  After 7pm go to [email or web address] - password [email or web address]  Call night coverage person covering after 7pm

## 2016-01-04 NOTE — Progress Notes (Signed)
Physical Therapy Treatment Patient Details Name: Cristian Collins MRN: [id number] DOB: 1930-02-16 Today's Date: 01/04/2016    History of Present Illness Pt is a 82 y/o M who presented to the ED w/ progressively worsening cough and dyspnea. Found to have community-acquired RLL streptococcal pneumonia.  Pt's PMh includes COPD, chest pain, anemia, prostate cancer, CAD.     PT Comments    Patient is making progress toward mobility goals with increased activity tolerance this session. SpO2 remained 96% on 2L O2 via nasal canula during ambulation. Current plan remains appropriate.   Follow Up Recommendations  Home health PT;Supervision for mobility/OOB     Equipment Recommendations  None recommended by PT    Recommendations for Other Services OT consult     Precautions / Restrictions Precautions Precautions: Fall Precaution Comments: monitor O2    Mobility  Bed Mobility               General bed mobility comments: not assessed this session; Pt OOB in chair upon arrival  Transfers Overall transfer level: Needs assistance Equipment used: Straight cane Transfers: Sit to/from Stand Sit to Stand: Min guard         General transfer comment: min guard for safety; pt c/o dizziness upon standing that subsided quickly; a little unsteady and held onto bed rail   Ambulation/Gait Ambulation/Gait assistance: Min assist Ambulation Distance (Feet): 200 Feet Assistive device: Straight cane Gait Pattern/deviations: Step-through pattern;Decreased stride length     General Gait Details: SpO2 94% on 2L O2 via nasal canula during ambulation; grossly min guard for safety but with LOB X2 requiring min A to recover when turning; cues for safe use of AD on L side   Stairs            Wheelchair Mobility    Modified Rankin (Stroke Patients Only)       Balance     Sitting balance-Leahy Scale: Good       Standing balance-Leahy Scale: Poor                       Cognition Arousal/Alertness: Awake/alert Behavior During Therapy: WFL for tasks assessed/performed Overall Cognitive Status: Within Functional Limits for tasks assessed                      Exercises      General Comments General comments (skin integrity, edema, etc.): encouraged pt to use RW when at home due to reported falls at home and  pt agreed he needs more stability       Pertinent Vitals/Pain Pain Assessment: No/denies pain    Home Living                      Prior Function            PT Goals (current goals can now be found in the care plan section) Acute Rehab PT Goals Patient Stated Goal: to go home Progress towards PT goals: Progressing toward goals    Frequency  Min 3X/week    PT Plan Current plan remains appropriate    Co-evaluation             End of Session Equipment Utilized During Treatment: Gait belt;Oxygen Activity Tolerance: Patient tolerated treatment well Patient left: in chair;with call bell/phone within reach;with chair alarm set     Time: [id number] PT Time Calculation (min) (ACUTE ONLY): 27 min  Charges:  $Gait Training: 8-22 mins $Therapeutic Activity: 8-22  mins                    G Codes:      Salina April, PTA Pager: [phone]   01/04/2016, 3:58 PM

## 2016-01-05 MED ORDER — IPRATROPIUM-ALBUTEROL 0.5-2.5 (3) MG/3ML IN SOLN
3.0000 mL | Freq: Three times a day (TID) | RESPIRATORY_TRACT | Status: DC
Start: 2016-01-05 — End: 2016-01-10
  Administered 2016-01-05 – 2016-01-10 (×14): 3 mL via RESPIRATORY_TRACT
  Filled 2016-01-05 (×14): qty 3

## 2016-01-05 MED ORDER — METHYLPREDNISOLONE SODIUM SUCC 125 MG IJ SOLR
60.0000 mg | Freq: Two times a day (BID) | INTRAMUSCULAR | Status: DC
  Administered 2016-01-05 – 2016-01-07 (×5): 60 mg via INTRAVENOUS
  Filled 2016-01-05 (×5): qty 2

## 2016-01-05 NOTE — Progress Notes (Signed)
[facility]                                                                              Patient Demographics  Cristian Collins, is a 82 y.o. male, DOB - [birth year], [id number]  Admit date - 12/29/2015   Admitting Physician [name], MD  Outpatient Primary MD for the patient is Cristian Greenland, MD  Outpatient specialists:   LOS - 6  days    Chief Complaint  Patient presents with  . Shortness of Breath       Brief summary   82 year old male, married, lives with spouse and ambulates with the help of a cane/walker, not on home oxygen, PMH of COPD, HTN, HLD, CAD, chronic chest pain, chronic diastolic CHF, GERD, former smoker, presented to [facility] ED on 12/29/15 with progressively worsening cough and dyspnea of 4 days' duration. He gives chronic, several months history of left sided chest pain which seems atypical-his PCP/cardiologists are aware. Seen by his PCP for itchy rash which is now resolving and was referred to a dermatologist. Admitted for pneumonia-improving. COPD exacerbation on 5/23.   Assessment & Plan    Community-acquired RLL streptococcal pneumonia - empirically started IV Rocephin and azithromycin. - Urine strept antigen positive. Urine Legionella: Negative. DC azithromycin - Recommend repeating chest x-ray in 3-4 weeks to ensure resolution of pneumonia findings. -  Sputum culture: Candida  Severe COPD with exacerbationWith chronic respiratory failure on home O2 2 L - Wheezing improving, taper IV steroids  - Placed on Brovana, Pulmicort, scheduled nebs, flutter valve, Solu-Medrol  Atypical chest pain/chronic chest pain syndrome - Apparently has had it for several months. He states that his PCP and primary cardiologist (Dr. [name]) are aware. One of 3 troponins minimally elevated but the third and last 1 was negative. EKG without acute findings. - [name] discussed with patient's primary cardiologist. Patient has chronic chest pain  syndrome. Cardiac cath had shown patent RCA stent and no other abnormalities. - Continue aspirin, statins and nitrates.  Essential hypertension Controlled   Chronic diastolic CHF - Compensated. LVEF of January 2017: 65-70 percent. Continue home dose of Lasix & Aldactone.  Hyperlipidemia  - Statins  Chronic anemia - Stable.  GERD - PPI  Moderate protein calorie Malnutrition - Continue nutritional supplements   Code Status: Full code   DVT Prophylaxis:  Lovenox    Family Communication: Discussed in detail with the patient, all imaging results, lab results explained to the patient   Disposition Plan: Hopefully DC home tomorrow if wheezing improving  Time Spent in minutes  15 minutes  Procedures:  None  Consultants:   None  Antimicrobials:   IV Rocephin  Azithromycin -discontinued   Medications  Scheduled Meds: . amLODipine  10 mg Oral Daily  . antiseptic oral rinse  7 mL Mouth Rinse BID  . arformoterol  15 mcg Nebulization BID  . aspirin EC  81 mg Oral Daily  . budesonide (PULMICORT) nebulizer solution  0.25 mg Nebulization BID  . cefTRIAXone (ROCEPHIN)  IV  2 g Intravenous Q24H  . enoxaparin (LOVENOX) injection  30 mg Subcutaneous Q24H  . feeding supplement (ENSURE ENLIVE)  237 mL Oral BID PC  . furosemide  20 mg Oral BID  . ipratropium-albuterol  3 mL Nebulization TID  . isosorbide mononitrate  60 mg Oral Daily  . methylPREDNISolone (SOLU-MEDROL) injection  60 mg Intravenous Q6H  . pantoprazole  40 mg Oral Daily  . simvastatin  20 mg Oral Daily  . sodium chloride flush  3 mL Intravenous Q12H  . spironolactone  25 mg Oral Daily   Continuous Infusions:  PRN Meds:.acetaminophen **OR** acetaminophen, albuterol, nitroGLYCERIN, ondansetron **OR** ondansetron (ZOFRAN) IV, traMADol   Antibiotics   Anti-infectives    Start     Dose/Rate Route Frequency Ordered Stop   01/01/16 0015  azithromycin (ZITHROMAX) tablet 500 mg     500 mg Oral  Once 01/01/16  0009 01/01/16 0152   12/31/15 2000  cefTRIAXone (ROCEPHIN) 2 g in dextrose 5 % 50 mL IVPB     2 g 100 mL/hr over 30 Minutes Intravenous Every 24 hours 12/31/15 1403 01/06/16 1959   12/30/15 2200  azithromycin (ZITHROMAX) 500 mg in dextrose 5 % 250 mL IVPB  Status:  Discontinued     500 mg 250 mL/hr over 60 Minutes Intravenous Every 24 hours 12/30/15 0044 01/01/16 1631   12/30/15 2000  cefTRIAXone (ROCEPHIN) 1 g in dextrose 5 % 50 mL IVPB  Status:  Discontinued     1 g 100 mL/hr over 30 Minutes Intravenous Every 24 hours 12/30/15 0044 12/31/15 1403   12/29/15 2130  azithromycin (ZITHROMAX) 500 mg in dextrose 5 % 250 mL IVPB     500 mg 250 mL/hr over 60 Minutes Intravenous  Once 12/29/15 2125 12/29/15 2329   12/29/15 2130  cefTRIAXone (ROCEPHIN) 1 g in dextrose 5 % 50 mL IVPB     1 g 100 mL/hr over 30 Minutes Intravenous  Once 12/29/15 2125 12/29/15 2309        Subjective:   Mahin [place] was seen and examined today. Wheezing is improving, no significant cough today. Overall improving. No fevers or chills. No chest pain. Patient denies dizziness, chest pain, abdominal pain, N/V/D/C, new weakness, numbess, tingling. No acute events overnight.    Objective:   Filed Vitals:   01/04/16 1546 01/04/16 2217 01/05/16 0559 01/05/16 1206  BP:  148/51 140/51   Pulse:  75 72 75  Temp:  97.7 F (36.5 C) 97.6 F (36.4 C)   TempSrc:  Oral Oral   Resp:  18 18 18   Height:      Weight:      SpO2: 98% 100% 100% 100%    Intake/Output Summary (Last 24 hours) at 01/05/16 1238 Last data filed at 01/05/16 0954  Gross per 24 hour  Intake    980 ml  Output    500 ml  Net    480 ml     Wt Readings from Last 3 Encounters:  01/04/16 43.5 kg (95 lb 14.4 oz)  08/12/15 46.72 kg (103 lb)  10/24/14 45.088 kg (99 lb 6.4 oz)     Exam  General: Alert and oriented x 3, NAD  HEENT:  .   Neck:   Cardiovascular: S1 S2 Clear, RRR  Respiratory: Mild expiratory wheezing bilaterally  improving  Gastrointestinal: Soft, NT, ND, NBS  Ext: no cyanosis clubbing or edema  Neuro: no new neuro deficits  Skin: No rashes  Psych: Normal affect and demeanor, alert and oriented x3    Data Reviewed:  I have personally reviewed following labs and imaging studies  Micro Results Recent Results (from  the past 240 hour(s))  Culture, sputum-assessment     Status: None   Collection Time: 12/30/15  9:29 PM  Result Value Ref Range Status   Specimen Description SPUTUM  Final   Special Requests NONE  Final   Sputum evaluation   Final    THIS SPECIMEN IS ACCEPTABLE. RESPIRATORY CULTURE REPORT TO FOLLOW.   Report Status 12/30/2015 FINAL  Final  Culture, respiratory (NON-Expectorated)     Status: None   Collection Time: 12/30/15  9:29 PM  Result Value Ref Range Status   Specimen Description SPUTUM  Final   Special Requests NONE  Final   Gram Stain   Final    FEW WBC PRESENT, PREDOMINANTLY PMN FEW SQUAMOUS EPITHELIAL CELLS PRESENT FEW GRAM POSITIVE COCCI IN PAIRS FEW YEAST Performed at [facility]    Culture   Final    MODERATE YEAST CONSISTENT WITH CANDIDA SPECIES Performed at [facility]    Report Status 01/02/2016 FINAL  Final    Radiology Reports Dg Chest 2 View  12/29/2015  CLINICAL DATA:  Shortness of breath.  COPD exacerbation. EXAM: CHEST  2 VIEW COMPARISON:  08/12/2015 chest radiograph. FINDINGS: Stable cardiomediastinal silhouette with normal heart size. No pneumothorax. No pleural effusion. Emphysema and hyperinflated lungs. No pulmonary edema. Mild hazy opacity in the right lower lobe appears new. IMPRESSION: 1. Mild hazy right lower lobe opacity appears new and suggests right lower lobe pneumonia or aspiration. Recommend follow-up PA and lateral post treatment chest radiographs in 4-6 weeks. 2. Emphysema and hyperinflated lungs, consistent with the provided history of COPD. Electronically Signed   By: Ilona Sorrel M.D.   On: 12/29/2015 21:08     Lab Data:  CBC:  Recent Labs Lab 12/29/15 2136 12/30/15 0309 12/30/15 0648  WBC 11.5* 12.9* 11.6*  NEUTROABS 10.5*  --  11.1*  HGB 9.2* 8.7* 9.1*  HCT 28.1* 27.1* 27.6*  MCV 84.4 84.7 84.4  PLT 208 172 0000000   Basic Metabolic Panel:  Recent Labs Lab 12/29/15 2136 12/30/15 0309 12/30/15 0648  NA 139  --  138  K 4.0  --  4.1  CL 104  --  98*  CO2 25  --  28  GLUCOSE 131*  --  172*  BUN 13  --  16  CREATININE 1.01 1.03 1.07  CALCIUM 8.8*  --  8.9   GFR: Estimated Creatinine Clearance: 32.2 mL/min (by C-G formula based on Cr of 1.07). Liver Function Tests:  Recent Labs Lab 12/30/15 0648  AST 28  ALT 15*  ALKPHOS 79  BILITOT 0.3  PROT 5.4*  ALBUMIN 2.8*   No results for input(s): LIPASE, AMYLASE in the last 168 hours. No results for input(s): AMMONIA in the last 168 hours. Coagulation Profile: No results for input(s): INR, PROTIME in the last 168 hours. Cardiac Enzymes:  Recent Labs Lab 12/30/15 0309 12/30/15 0648 12/30/15 1209  TROPONINI <0.03 0.05* 0.03   BNP (last 3 results) No results for input(s): PROBNP in the last 8760 hours. HbA1C: No results for input(s): HGBA1C in the last 72 hours. CBG: No results for input(s): GLUCAP in the last 168 hours. Lipid Profile: No results for input(s): CHOL, HDL, LDLCALC, TRIG, CHOLHDL, LDLDIRECT in the last 72 hours. Thyroid Function Tests: No results for input(s): TSH, T4TOTAL, FREET4, T3FREE, THYROIDAB in the last 72 hours. Anemia Panel: No results for input(s): VITAMINB12, FOLATE, FERRITIN, TIBC, IRON, RETICCTPCT in the last 72 hours. Urine analysis:    Component Value Date/Time  COLORURINE YELLOW 08/12/2015 [place] 08/12/2015 2312   LABSPEC 1.010 08/12/2015 2312   PHURINE 5.0 08/12/2015 [place] 08/12/2015 [place] 08/12/2015 2312   BILIRUBINUR NEGATIVE 08/12/2015 [place] 08/12/2015 2312   PROTEINUR NEGATIVE 08/12/2015 2312    UROBILINOGEN 0.2 10/08/2014 0838   NITRITE NEGATIVE 08/12/2015 2312   LEUKOCYTESUR NEGATIVE 08/12/2015 2312     Kamarii Carton M.D. [facility] 01/05/2016, 12:38 PM  Pager: [phone] Between 7am to 7pm - call Pager - [phone]  After 7pm go to [email or web address] - password [email or web address]  Call night coverage person covering after 7pm

## 2016-01-05 NOTE — Progress Notes (Signed)
Physical Therapy Treatment Patient Details Name: Cristian Collins MRN: [id number] DOB: [birth year] Today's Date: 01/05/2016    History of Present Illness Pt is a 82 y/o M who presented to the ED w/ progressively worsening cough and dyspnea. Found to have community-acquired RLL streptococcal pneumonia.  Pt's PMh includes COPD, chest pain, anemia, prostate cancer, CAD.     PT Comments    Patient continues to make gradual progress toward mobility goals. Min guard for ambulation with SPC. SpO2 remained 94% or >. Patient needs to practice stairs next session.  Continue to progress as tolerated with anticipated d/c home with HHPT.   Follow Up Recommendations  Home health PT;Supervision for mobility/OOB     Equipment Recommendations  None recommended by PT    Recommendations for Other Services OT consult     Precautions / Restrictions Precautions Precautions: Fall Precaution Comments: monitor O2 Restrictions Weight Bearing Restrictions: No    Mobility  Bed Mobility               General bed mobility comments: not assessed this session; Pt OOB in chair upon arrival  Transfers Overall transfer level: Needs assistance Equipment used: None Transfers: Sit to/from Stand Sit to Stand: Min guard         General transfer comment: min guard for safety  Ambulation/Gait Ambulation/Gait assistance: Min guard Ambulation Distance (Feet): 200 Feet Assistive device: Straight cane Gait Pattern/deviations: Step-through pattern;Decreased stride length;Trunk flexed;Narrow base of support     General Gait Details: cues for use of SPC L side, increased bilat step length, and cadence; pt with no unsteadiness with increased cadence; SpO2 remained 94% or >   Stairs            Wheelchair Mobility    Modified Rankin (Stroke Patients Only)       Balance     Sitting balance-Leahy Scale: Good       Standing balance-Leahy Scale: Fair                       Cognition Arousal/Alertness: Awake/alert Behavior During Therapy: WFL for tasks assessed/performed Overall Cognitive Status: Within Functional Limits for tasks assessed                      Exercises      General Comments        Pertinent Vitals/Pain      Home Living                      Prior Function            PT Goals (current goals can now be found in the care plan section) Acute Rehab PT Goals Patient Stated Goal: to go home Progress towards PT goals: Progressing toward goals    Frequency  Min 3X/week    PT Plan Current plan remains appropriate    Co-evaluation             End of Session Equipment Utilized During Treatment: Gait belt;Oxygen (2L) Activity Tolerance: Patient tolerated treatment well Patient left: in chair;with call bell/phone within reach;with chair alarm set     Time: [id number] PT Time Calculation (min) (ACUTE ONLY): 21 min  Charges:  $Gait Training: 8-22 mins                    G Codes:      Salina April, PTA Pager: [phone]   01/05/2016,  3:08 PM

## 2016-01-05 NOTE — Care Management Important Message (Signed)
Important Message  Patient Details  Name: Cristian Collins MRN: [id number] Date of Birth: 09-Apr-1930   Medicare Important Message Given:  Yes    Klaira Pesci Abena 01/05/2016, 1:30 PM

## 2016-01-06 DIAGNOSIS — J449 Chronic obstructive pulmonary disease, unspecified: Secondary | ICD-10-CM

## 2016-01-06 DIAGNOSIS — J441 Chronic obstructive pulmonary disease with (acute) exacerbation: Secondary | ICD-10-CM

## 2016-01-06 DIAGNOSIS — J9601 Acute respiratory failure with hypoxia: Secondary | ICD-10-CM

## 2016-01-06 DIAGNOSIS — J9612 Chronic respiratory failure with hypercapnia: Secondary | ICD-10-CM

## 2016-01-06 LAB — CREATININE, SERUM
Creatinine, Ser: 1.15 mg/dL (ref 0.61–1.24)
GFR, EST NON AFRICAN AMERICAN: 57 mL/min — AB (ref >60)

## 2016-01-06 NOTE — Progress Notes (Signed)
[facility]                                                                              Patient Demographics  Cristian Collins, is a 82 y.o. male, DOB - May 12, 1930, [id number]  Admit date - 12/29/2015   Admitting Physician [name], MD  Outpatient Primary MD for the patient is [name], MD  Outpatient specialists:   LOS - 7  days    Chief Complaint  Patient presents with  . Shortness of Breath       Brief summary   82 year old male, married, lives with spouse and ambulates with the help of a cane/walker, not on home oxygen, PMH of COPD, HTN, HLD, CAD, chronic chest pain, chronic diastolic CHF, GERD, former smoker, presented to [facility] ED on 12/29/15 with progressively worsening cough and dyspnea of 4 days' duration. He gives chronic, several months history of left sided chest pain which seems atypical-his PCP/cardiologists are aware. Seen by his PCP for itchy rash which is now resolving and was referred to a dermatologist. Admitted for pneumonia-improving. COPD exacerbation on 5/23.   Assessment & Plan    Community-acquired RLL streptococcal pneumonia - empirically started IV Rocephin and azithromycin. - Urine strept antigen positive. Urine Legionella: Negative. DC azithromycin - Recommend repeating chest x-ray in 3-4 weeks to ensure resolution of pneumonia findings. -  Sputum culture: Candida  Severe COPD with exacerbationWith chronic respiratory failure on home O2 2 L - wheezing worsening this morning -  on Brovana, Pulmicort, scheduled nebs, flutter valve, Solu-Medrol was tapered yesterday - will consult pulmonology, follows Dr. Melvyn Novas  Atypical chest pain/chronic chest pain syndrome- currently stable - Apparently has had it for several months. He states that his PCP and primary cardiologist (Dr. [name]) are aware. One of 3 troponins minimally elevated but the third and last 1 was negative. EKG without acute findings. - [name] discussed  with patient's primary cardiologist. Patient has chronic chest pain syndrome. Cardiac cath had shown patent RCA stent and no other abnormalities. - Continue aspirin, statins and nitrates.  Essential hypertension Controlled   Chronic diastolic CHF - Compensated. LVEF of January 2017: 65-70 percent.  - Continue home dose of Lasix & Aldactone.  Hyperlipidemia  - Statins  Chronic anemia - Stable.  GERD - PPI  Moderate protein calorie Malnutrition - Continue nutritional supplements   Code Status: Full code   DVT Prophylaxis:  Lovenox    Family Communication: Discussed in detail with the patient, all imaging results, lab results explained to the patient   Disposition Plan:   Time Spent in minutes  25 minutes  Procedures:  None  Consultants:   None  Antimicrobials:   IV Rocephin  Azithromycin -discontinued   Medications  Scheduled Meds: . amLODipine  10 mg Oral Daily  . antiseptic oral rinse  7 mL Mouth Rinse BID  . arformoterol  15 mcg Nebulization BID  . aspirin EC  81 mg Oral Daily  . budesonide (PULMICORT) nebulizer solution  0.25 mg Nebulization BID  . enoxaparin (LOVENOX) injection  30 mg Subcutaneous Q24H  . feeding supplement (ENSURE ENLIVE)  237 mL Oral BID  PC  . furosemide  20 mg Oral BID  . ipratropium-albuterol  3 mL Nebulization TID  . isosorbide mononitrate  60 mg Oral Daily  . methylPREDNISolone (SOLU-MEDROL) injection  60 mg Intravenous Q12H  . pantoprazole  40 mg Oral Daily  . simvastatin  20 mg Oral Daily  . sodium chloride flush  3 mL Intravenous Q12H  . spironolactone  25 mg Oral Daily   Continuous Infusions:  PRN Meds:.acetaminophen **OR** acetaminophen, albuterol, nitroGLYCERIN, ondansetron **OR** ondansetron (ZOFRAN) IV, traMADol   Antibiotics   Anti-infectives    Start     Dose/Rate Route Frequency Ordered Stop   01/01/16 0015  azithromycin (ZITHROMAX) tablet 500 mg     500 mg Oral  Once 01/01/16 0009 01/01/16 0152    12/31/15 2000  cefTRIAXone (ROCEPHIN) 2 g in dextrose 5 % 50 mL IVPB     2 g 100 mL/hr over 30 Minutes Intravenous Every 24 hours 12/31/15 1403 01/05/16 2226   12/30/15 2200  azithromycin (ZITHROMAX) 500 mg in dextrose 5 % 250 mL IVPB  Status:  Discontinued     500 mg 250 mL/hr over 60 Minutes Intravenous Every 24 hours 12/30/15 0044 01/01/16 1631   12/30/15 2000  cefTRIAXone (ROCEPHIN) 1 g in dextrose 5 % 50 mL IVPB  Status:  Discontinued     1 g 100 mL/hr over 30 Minutes Intravenous Every 24 hours 12/30/15 0044 12/31/15 1403   12/29/15 2130  azithromycin (ZITHROMAX) 500 mg in dextrose 5 % 250 mL IVPB     500 mg 250 mL/hr over 60 Minutes Intravenous  Once 12/29/15 2125 12/29/15 2329   12/29/15 2130  cefTRIAXone (ROCEPHIN) 1 g in dextrose 5 % 50 mL IVPB     1 g 100 mL/hr over 30 Minutes Intravenous  Once 12/29/15 2125 12/29/15 2309        Subjective:   Cristian Collins was seen and examined today. Wheezing again this morning, having coughing and abdominal pain from the coughing. No fevers or chills. No chest pain. Patient denies dizziness, chest pain, abdominal pain, N/V/D/C, new weakness, numbess, tingling. No acute events overnight.    Objective:   Filed Vitals:   01/06/16 0754 01/06/16 0800 01/06/16 0807 01/06/16 1045  BP:    141/52  Pulse:      Temp:      TempSrc:      Resp:      Height:      Weight:      SpO2: 97% 97% 97%     Intake/Output Summary (Last 24 hours) at 01/06/16 1139 Last data filed at 01/06/16 0531  Gross per 24 hour  Intake    840 ml  Output   1600 ml  Net   -760 ml     Wt Readings from Last 3 Encounters:  01/06/16 44.543 kg (98 lb 3.2 oz)  08/12/15 46.72 kg (103 lb)  10/24/14 45.088 kg (99 lb 6.4 oz)     Exam  General: Alert and oriented x 3, NAD  HEENT:  .   Neck:   Cardiovascular: S1 S2 Clear, RRR  Respiratory:Diffuse wheezing bilaterally  Gastrointestinal: Soft, NT, ND, NBS  Ext: no cyanosis clubbing or edema  Neuro: no new  neuro deficits  Skin: No rashes  Psych: Normal affect and demeanor, alert and oriented x3    Data Reviewed:  I have personally reviewed following labs and imaging studies  Micro Results Recent Results (from the past 240 hour(s))  Culture, sputum-assessment     Status:  None   Collection Time: 12/30/15  9:29 PM  Result Value Ref Range Status   Specimen Description SPUTUM  Final   Special Requests NONE  Final   Sputum evaluation   Final    THIS SPECIMEN IS ACCEPTABLE. RESPIRATORY CULTURE REPORT TO FOLLOW.   Report Status 12/30/2015 FINAL  Final  Culture, respiratory (NON-Expectorated)     Status: None   Collection Time: 12/30/15  9:29 PM  Result Value Ref Range Status   Specimen Description SPUTUM  Final   Special Requests NONE  Final   Gram Stain   Final    FEW WBC PRESENT, PREDOMINANTLY PMN FEW SQUAMOUS EPITHELIAL CELLS PRESENT FEW GRAM POSITIVE COCCI IN PAIRS FEW YEAST Performed at [facility]    Culture   Final    MODERATE YEAST CONSISTENT WITH CANDIDA SPECIES Performed at [facility]    Report Status 01/02/2016 FINAL  Final    Radiology Reports Dg Chest 2 View  12/29/2015  CLINICAL DATA:  Shortness of breath.  COPD exacerbation. EXAM: CHEST  2 VIEW COMPARISON:  08/12/2015 chest radiograph. FINDINGS: Stable cardiomediastinal silhouette with normal heart size. No pneumothorax. No pleural effusion. Emphysema and hyperinflated lungs. No pulmonary edema. Mild hazy opacity in the right lower lobe appears new. IMPRESSION: 1. Mild hazy right lower lobe opacity appears new and suggests right lower lobe pneumonia or aspiration. Recommend follow-up PA and lateral post treatment chest radiographs in 4-6 weeks. 2. Emphysema and hyperinflated lungs, consistent with the provided history of COPD. Electronically Signed   By: Ilona Sorrel M.D.   On: 12/29/2015 21:08    Lab Data:  CBC: No results for input(s): WBC, NEUTROABS, HGB, HCT, MCV, PLT in the last 168  hours. Basic Metabolic Panel:  Recent Labs Lab 01/06/16 0618  CREATININE 1.15   GFR: Estimated Creatinine Clearance: 30.6 mL/min (by C-G formula based on Cr of 1.15). Liver Function Tests: No results for input(s): AST, ALT, ALKPHOS, BILITOT, PROT, ALBUMIN in the last 168 hours. No results for input(s): LIPASE, AMYLASE in the last 168 hours. No results for input(s): AMMONIA in the last 168 hours. Coagulation Profile: No results for input(s): INR, PROTIME in the last 168 hours. Cardiac Enzymes:  Recent Labs Lab 12/30/15 1209  TROPONINI 0.03   BNP (last 3 results) No results for input(s): PROBNP in the last 8760 hours. HbA1C: No results for input(s): HGBA1C in the last 72 hours. CBG: No results for input(s): GLUCAP in the last 168 hours. Lipid Profile: No results for input(s): CHOL, HDL, LDLCALC, TRIG, CHOLHDL, LDLDIRECT in the last 72 hours. Thyroid Function Tests: No results for input(s): TSH, T4TOTAL, FREET4, T3FREE, THYROIDAB in the last 72 hours. Anemia Panel: No results for input(s): VITAMINB12, FOLATE, FERRITIN, TIBC, IRON, RETICCTPCT in the last 72 hours. Urine analysis:    Component Value Date/Time   COLORURINE YELLOW 08/12/2015 2312   APPEARANCEUR CLEAR 08/12/2015 2312   LABSPEC 1.010 08/12/2015 2312   PHURINE 5.0 08/12/2015 2312   GLUCOSEU NEGATIVE 08/12/2015 2312   HGBUR NEGATIVE 08/12/2015 2312   BILIRUBINUR NEGATIVE 08/12/2015 2312   KETONESUR NEGATIVE 08/12/2015 2312   PROTEINUR NEGATIVE 08/12/2015 2312   UROBILINOGEN 0.2 10/08/2014 0838   NITRITE NEGATIVE 08/12/2015 2312   LEUKOCYTESUR NEGATIVE 08/12/2015 2312     Judith Demps M.D. [facility] 01/06/2016, 11:39 AM  Pager: [phone] Between 7am to 7pm - call Pager - [phone]  After 7pm go to [email or web address] - password [email or web address]  Call night coverage person covering after 7pm

## 2016-01-06 NOTE — Consult Note (Signed)
PULMONARY / CRITICAL CARE MEDICINE   Name: Cristian Collins MRN: [id number] DOB: 03-26-1930    ADMISSION DATE:  12/29/2015 CONSULTATION DATE:   01/06/16   REFERRING MD:  [facility]  CHIEF COMPLAINT:  Sob   HISTORY OF PRESENT ILLNESS:   85 yobm quit smoking 1970's due to cough/ sob got some better then started getting worse again around 2009 referred by [name] 04/07/2012 to pulmonary clinic for sob x 6 months with GOLD III COPD by pfts 05/2012 and last seen in pulm clinic by [facility] 10/24/14 well compensated on 2lpm/ spiriva/ symb with rare saba but started going downhill again one month pta eval by PC rx with pred/levaquin only a little better then much worse starting 4 d pta with cough/ congestion/sob at rest not responding to saba which had been increased for weeks s much benefit.    No obvious day to day or daytime variability or assoc classically pleuritic cp or chest tightness, subjective wheeze or overt sinus or hb symptoms. No unusual exp hx or h/o childhood pna/ asthma or knowledge of premature birth.  Sleeping ok without nocturnal  or early am exacerbation  of respiratory  c/o's or need for noct saba. Also denies any obvious fluctuation of symptoms with weather or environmental changes or other aggravating or alleviating factors except as outlined above   Current Medications, Allergies, Complete Past Medical History, Past Surgical History, Family History, and Social History were reviewed in [employer] record.  ROS  The following are not active complaints unless bolded sore throat, dysphagia, dental problems, itching, sneezing,  nasal congestion or excess/ purulent secretions, ear ache,   fever, chills, sweats, unintended wt loss, classically pleuritic or exertional cp,  orthopnea pnd or leg swelling, presyncope, palpitations, abdominal pain, anorexia, nausea, vomiting, diarrhea  or change in bowel or bladder habits, change in stools or urine,  dysuria,hematuria,  rash, arthralgias, visual complaints, headache, numbness, weakness or ataxia or problems with walking or coordination,  change in mood/affect or memory.          PAST MEDICAL HISTORY :  He  has a past medical history of COPD (chronic obstructive pulmonary disease) ([place]); Hypertension; Hyperlipidemia; History of peptic ulcer disease; Anemia; Gastritis; Urinary retention; History of hypokalemia; Hyponatremia; Prostate cancer ([place]); Coronary artery disease; Chest pain; Pneumonia; Chronic bronchitis ([place]); Exertional shortness of breath; Migraines (1960's); Right knee DJD; Anginal pain ([place]); Peripheral vascular disease ([place]); CHF (congestive heart failure) ([place]); Asthma; GERD (gastroesophageal reflux disease); H/O hiatal hernia; Blood dyscrasia; and AKI (acute kidney injury) ([place]) (10/08/2014).  PAST SURGICAL HISTORY: He  has past surgical history that includes Partial gastrectomy (1950); Prostatectomy (~ 2006); Replacement total knee (Right, 11/22/2010); Tonsillectomy (~ 1949); Appendectomy (~ 1955); Cardiac catheterization (07/2012); Coronary angioplasty with stent (03/09/2013); Joint replacement; left heart catheterization with coronary angiogram (N/A, 07/28/2012); left heart catheterization with coronary angiogram (N/A, 03/09/2013); percutaneous coronary stent intervention (pci-s) (03/09/2013); and left heart catheterization with coronary angiogram (N/A, 07/21/2013).  No Known Allergies  No current facility-administered medications on file prior to encounter.   Current Outpatient Prescriptions on File Prior to Encounter  Medication Sig  . albuterol (PROAIR HFA) 108 (90 BASE) MCG/ACT inhaler Inhale 2 puffs into the lungs 4 (four) times daily. For shortness of breath  . albuterol (PROVENTIL) (2.5 MG/3ML) 0.083% nebulizer solution Take 3 mLs (2.5 mg total) by nebulization every 4 (four) hours as needed for shortness of breath. For shortness of breath  . amLODipine (NORVASC) 10 MG  tablet  Take 10 mg by mouth daily.   Marland Kitchen aspirin EC 81 MG tablet Take 81 mg by mouth daily.  . budesonide-formoterol (SYMBICORT) 160-4.5 MCG/ACT inhaler Inhale 2 puffs into the lungs 2 (two) times daily.  Marland Kitchen eplerenone (INSPRA) 25 MG tablet Take 25 mg by mouth daily. Reported on 08/12/2015  . feeding supplement, ENSURE COMPLETE, (ENSURE COMPLETE) LIQD Take 237 mLs by mouth 3 (three) times daily between meals.  . furosemide (LASIX) 20 MG tablet Take 20 mg by mouth 2 (two) times daily.  Marland Kitchen ipratropium (ATROVENT) 0.02 % nebulizer solution Take 2.5 mLs (0.5 mg total) by nebulization every 6 (six) hours.  . isosorbide mononitrate (IMDUR) 60 MG 24 hr tablet Take 60 mg by mouth daily.  . pantoprazole (PROTONIX) 40 MG tablet Take 1 tablet (40 mg total) by mouth daily.  . simvastatin (ZOCOR) 20 MG tablet Take 20 mg by mouth daily.   Marland Kitchen spironolactone (ALDACTONE) 25 MG tablet Take 25 mg by mouth every other day.  . tiotropium (SPIRIVA) 18 MCG inhalation capsule Place 18 mcg into inhaler and inhale daily.   Marland Kitchen levofloxacin (LEVAQUIN) 500 MG tablet Take 1 tablet (500 mg total) by mouth every other day. (Patient not taking: Reported on 12/29/2015)  . nitroGLYCERIN (NITROSTAT) 0.4 MG SL tablet Place 0.4 mg under the tongue every 5 (five) minutes as needed for chest pain. For chest pain  . predniSONE (STERAPRED UNI-PAK 21 TAB) 10 MG (21) TBPK tablet Take 1 tablet (10 mg total) by mouth daily. Prednisone 40 mg daily for 3 days followed by  Prednisone 20 mg daily for 3 days. (Patient not taking: Reported on 12/29/2015)    FAMILY HISTORY:  His has no family status information on file.   SOCIAL HISTORY: He  reports that he quit smoking about 47 years ago. His smoking use included Cigarettes. He has a 20 pack-year smoking history. He has never used smokeless tobacco. He reports that he does not use illicit drugs.     SUBJECTIVE:  Hoarse anxious bm nad sitting in chair   VITAL SIGNS: BP 151/53 mmHg  Pulse 78   Temp(Src) 97.9 F (36.6 C) (Oral)  Resp 20  Ht 5\' 6"  (1.676 m)  Wt 98 lb 3.2 oz (44.543 kg)  BMI 15.86 kg/m2  SpO2 97% on 2lpm   HEMODYNAMICS:    VENTILATOR SETTINGS:    INTAKE / OUTPUT: I/O last 3 completed shifts: In: 1380 [P.O.:1380] Out: 2100 [Urine:2100]  PHYSICAL EXAMINATION: Elderly very frail thin bm nad but quite hoarse Wt Readings from Last 3 Encounters:  01/06/16 98 lb 3.2 oz (44.543 kg)  08/12/15 103 lb (46.72 kg)  10/24/14 99 lb 6.4 oz (45.088 kg)    Vital signs reviewed  HEENT mild turbinate edema.  Oropharynx no thrush or excess pnd or cobblestoning.  No JVD or cervical adenopathy. Mild accessory muscle hypertrophy. Trachea midline, nl thryroid. Chest was hyperinflated by percussion with diminished breath sounds and moderate increased exp time without wheeze. Hoover sign positive at mid inspiration. Regular rate and rhythm without murmur gallop or rub or increase P2 or edema.  Abd: no hsm, nl excursion. Ext warm without cyanosis or clubbing.    LABS:  BMET  Recent Labs Lab 01/06/16 0618  CREATININE 1.15    Electrolytes No results for input(s): CALCIUM, MG, PHOS in the last 168 hours.  CBC No results for input(s): WBC, HGB, HCT, PLT in the last 168 hours.  Coag's No results for input(s): APTT, INR in the last 168 hours.  Sepsis Markers No results for input(s): LATICACIDVEN, PROCALCITON, O2SATVEN in the last 168 hours.  ABG No results for input(s): PHART, PCO2ART, PO2ART in the last 168 hours.  Liver Enzymes No results for input(s): AST, ALT, ALKPHOS, BILITOT, ALBUMIN in the last 168 hours.  Cardiac Enzymes No results for input(s): TROPONINI, PROBNP in the last 168 hours.  Glucose No results for input(s): GLUCAP in the last 168 hours.  Imaging No results found.   Micro: Urine Strep 12/30/15  >  POS Urine Legionella 12/30/15 > Neg Sputum 5/20 > yeast only   ABX Rocephin 5/19 >> Zmax  5/19 - 12/31/15    IMP/ Recs 1) GOLD III  criteria/ Group D COPD symptoms relatively stable until 1 month pta  DDX of  difficult airways management almost all start with A and  include Adherence, Ace Inhibitors, Acid Reflux, Active Sinus Disease, Alpha 1 Antitripsin deficiency, Anxiety masquerading as Airways dz,  ABPA,  Allergy(esp in young), Aspiration (esp in elderly), Adverse effects of meds,  Active smokers, A bunch of PE's (a small clot burden can't cause this syndrome unless there is already severe underlying pulm or vascular dz with poor reserve) plus two Bs  = Bronchiectasis and Beta blocker use..and one C= CHF  Adherence is always the initial "prime suspect" and is a multilayered concern that requires a "trust but verify" approach in every patient - starting with knowing how to use medications, especially inhalers, correctly, keeping up with refills and understanding the fundamental difference between maintenance and prns vs those medications only taken for a very short course and then stopped and not refilled.  - did not follow outpt instructions from last ov but still did ok for a year s pulmonary input  ? Acid (or non-acid) GERD > always difficult to exclude as up to 75% of pts in some series report no assoc GI/ Heartburn symptoms> rec max (24h)  acid suppression and diet restrictions/ reviewed and instructions given in writing.   Anxiety/depression sually dx of exclusion but higher on list for him as just lost sister with apparent MI  ? Allergy/asthmatic component > taper steroids as tol  ? chf > unlikely with bnp 155 on admit   2) Possible strep pna  - rocephin 5/19 > rec complete 10 days   3) chronic resp failure with hypoxemia but not hypercarbia -well comp on 2lpm 24/7/ no change in rx     [name], MD Pulmonary and [place] [phone] After 5:30 PM or weekends, call [phone]

## 2016-01-07 ENCOUNTER — Inpatient Hospital Stay (HOSPITAL_COMMUNITY): Payer: Medicare Other

## 2016-01-07 DIAGNOSIS — J189 Pneumonia, unspecified organism: Secondary | ICD-10-CM

## 2016-01-07 LAB — CBC
HEMATOCRIT: 30.7 % — AB (ref 39.0–52.0)
Hemoglobin: 10 g/dL — ABNORMAL LOW (ref 13.0–17.0)
MCH: 27.3 pg (ref 26.0–34.0)
MCHC: 32.6 g/dL (ref 30.0–36.0)
MCV: 83.9 fL (ref 78.0–100.0)
PLATELETS: 333 10*3/uL (ref 150–400)
RBC: 3.66 MIL/uL — ABNORMAL LOW (ref 4.22–5.81)
RDW: 13.6 % (ref 11.5–15.5)
WBC: 8.8 10*3/uL (ref 4.0–10.5)

## 2016-01-07 LAB — BASIC METABOLIC PANEL
Anion gap: 9 (ref 5–15)
BUN: 30 mg/dL — AB (ref 6–20)
CALCIUM: 8.6 mg/dL — AB (ref 8.9–10.3)
CO2: 30 mmol/L (ref 22–32)
CREATININE: 1.16 mg/dL (ref 0.61–1.24)
Chloride: 91 mmol/L — ABNORMAL LOW (ref 101–111)
GFR, EST NON AFRICAN AMERICAN: 56 mL/min — AB (ref >60)
GLUCOSE: 223 mg/dL — AB (ref 65–99)
POTASSIUM: 4.7 mmol/L (ref 3.5–5.1)
SODIUM: 130 mmol/L — AB (ref 135–145)

## 2016-01-07 MED ORDER — MENTHOL 3 MG MT LOZG: 1.0000 | LOZENGE | OROMUCOSAL | Status: DC | PRN

## 2016-01-07 MED ORDER — GUAIFENESIN-CODEINE 100-10 MG/5ML PO SOLN: 5.0000 mL | Freq: Four times a day (QID) | ORAL | Status: DC | PRN

## 2016-01-07 MED ORDER — BENZONATATE 100 MG PO CAPS
200.0000 mg | ORAL_CAPSULE | Freq: Three times a day (TID) | ORAL | Status: DC
  Administered 2016-01-07 – 2016-01-10 (×10): 200 mg via ORAL
  Filled 2016-01-07 (×10): qty 2

## 2016-01-07 NOTE — Progress Notes (Signed)
Cristian Collins                                                                              Patient Demographics  Cristian Collins, is a 82 y.o. male, DOB - Nov 23, 1929, [id number]  Admit date - 12/29/2015   Admitting Physician [name], MD  Outpatient Primary MD for the patient is [name], MD  Outpatient specialists:   LOS - 8  days    Chief Complaint  Patient presents with  . Shortness of Breath       Brief summary   82 year old male, married, lives with spouse and ambulates with the help of a cane/walker, not on home oxygen, PMH of COPD, HTN, HLD, CAD, chronic chest pain, chronic diastolic CHF, GERD, former smoker, presented to [name] ED on 12/29/15 with progressively worsening cough and dyspnea of 4 days' duration. He gives chronic, several months history of left sided chest pain which seems atypical-his PCP/cardiologists are aware. Seen by his PCP for itchy rash which is now resolving and was referred to a dermatologist. Admitted for pneumonia-improving. COPD exacerbation on 5/23.   Assessment & Plan    Community-acquired RLL streptococcal pneumonia - empirically started IV Rocephin and azithromycin. - Urine strept antigen positive. Urine Legionella: Negative. DC azithromycin - Recommend repeating chest x-ray in 3-4 weeks to ensure resolution of pneumonia findings. -  Sputum culture: Candida  Severe COPD with exacerbationWith chronic respiratory failure on home O2 2 L -  on Brovana, Pulmicort, scheduled nebs, flutter valve, IV Solu-Medrol  -  Appreciate pulmonology following - Coughing significantly, placed on Tessalon Perles, Robitussin, Cepacol  - Recheck chest x-ray and sinus CT today   Atypical chest pain/chronic chest pain syndrome- currently stable - Apparently has had it for several months. He states that his PCP and primary cardiologist (Dr. [name]) are aware. One of 3 troponins minimally elevated but the third and last 1 was  negative. EKG without acute findings. - [name] discussed with patient's primary cardiologist. Patient has chronic chest pain syndrome. Cardiac cath had shown patent RCA stent and no other abnormalities. - Continue aspirin, statins and nitrates.  Essential hypertension Controlled   Chronic diastolic CHF - Compensated. LVEF of January 2017: 65-70 percent.  - Continue home dose of Lasix & Aldactone.  Hyperlipidemia  - Statins  Chronic anemia - Stable.  GERD - PPI  Moderate protein calorie Malnutrition - Continue nutritional supplements   Code Status: Full code   DVT Prophylaxis:  Lovenox    Family Communication: Discussed in detail with the patient, all imaging results, lab results explained to the patient   Disposition Plan:   Time Spent in minutes  25 minutes  Procedures:  None  Consults Pulmonology  Antimicrobials:   IV Rocephin  Azithromycin -discontinued   Medications  Scheduled Meds: . amLODipine  10 mg Oral Daily  . antiseptic oral rinse  7 mL Mouth Rinse BID  . arformoterol  15 mcg Nebulization BID  . aspirin EC  81 mg Oral Daily  . benzonatate  200 mg Oral TID  . budesonide (PULMICORT) nebulizer solution  0.25 mg Nebulization BID  . enoxaparin (LOVENOX)  injection  30 mg Subcutaneous Q24H  . feeding supplement (ENSURE ENLIVE)  237 mL Oral BID PC  . furosemide  20 mg Oral BID  . ipratropium-albuterol  3 mL Nebulization TID  . isosorbide mononitrate  60 mg Oral Daily  . methylPREDNISolone (SOLU-MEDROL) injection  60 mg Intravenous Q12H  . pantoprazole  40 mg Oral Daily  . simvastatin  20 mg Oral Daily  . sodium chloride flush  3 mL Intravenous Q12H  . spironolactone  25 mg Oral Daily   Continuous Infusions:  PRN Meds:.acetaminophen **OR** acetaminophen, albuterol, guaiFENesin-codeine, menthol-cetylpyridinium, nitroGLYCERIN, ondansetron **OR** ondansetron (ZOFRAN) IV, traMADol   Antibiotics   Anti-infectives    Start     Dose/Rate  Route Frequency Ordered Stop   01/01/16 0015  azithromycin (ZITHROMAX) tablet 500 mg     500 mg Oral  Once 01/01/16 0009 01/01/16 0152   12/31/15 2000  cefTRIAXone (ROCEPHIN) 2 g in dextrose 5 % 50 mL IVPB     2 g 100 mL/hr over 30 Minutes Intravenous Every 24 hours 12/31/15 1403 01/05/16 2226   12/30/15 2200  azithromycin (ZITHROMAX) 500 mg in dextrose 5 % 250 mL IVPB  Status:  Discontinued     500 mg 250 mL/hr over 60 Minutes Intravenous Every 24 hours 12/30/15 0044 01/01/16 1631   12/30/15 2000  cefTRIAXone (ROCEPHIN) 1 g in dextrose 5 % 50 mL IVPB  Status:  Discontinued     1 g 100 mL/hr over 30 Minutes Intravenous Every 24 hours 12/30/15 0044 12/31/15 1403   12/29/15 2130  azithromycin (ZITHROMAX) 500 mg in dextrose 5 % 250 mL IVPB     500 mg 250 mL/hr over 60 Minutes Intravenous  Once 12/29/15 2125 12/29/15 2329   12/29/15 2130  cefTRIAXone (ROCEPHIN) 1 g in dextrose 5 % 50 mL IVPB     1 g 100 mL/hr over 30 Minutes Intravenous  Once 12/29/15 2125 12/29/15 2309        Subjective:   [name] was seen and examined today. Coughing a lot otherwise wheezing slightly better from yesterday. No fevers or chills. No chest pain. Patient denies dizziness, chest pain, abdominal pain, N/V/D/C, new weakness, numbess, tingling. No acute events overnight.    Objective:   Filed Vitals:   01/06/16 2142 01/07/16 0545 01/07/16 0828 01/07/16 0933  BP: 134/56 144/53 126/58   Pulse: 80 77    Temp: 97.6 F (36.4 C) 98.2 F (36.8 C)    TempSrc: Oral Oral    Resp: 17 17    Height:      Weight:  47.5 kg (104 lb 11.5 oz)    SpO2: 99% 100%  98%    Intake/Output Summary (Last 24 hours) at 01/07/16 1058 Last data filed at 01/07/16 0817  Gross per 24 hour  Intake    300 ml  Output   1500 ml  Net  -1200 ml     Wt Readings from Last 3 Encounters:  01/07/16 47.5 kg (104 lb 11.5 oz)  08/12/15 46.72 kg (103 lb)  10/24/14 45.088 kg (99 lb 6.4 oz)     Exam  General: Alert and  oriented x 3, NAD  HEENT:  .   Neck:   Cardiovascular: S1 S2 Clear, RRR  Respiratory: Expiratory wheezing bilaterally, slightly improved from yesterday  gastrointestinal: Soft, NT, ND, NBS  Ext: no cyanosis clubbing or edema  Neuro: no new neuro deficits  Skin: No rashes  Psych: Normal affect and demeanor, alert and oriented x3  Data Reviewed:  I have personally reviewed following labs and imaging studies  Micro Results Recent Results (from the past 240 hour(s))  Culture, sputum-assessment     Status: None   Collection Time: 12/30/15  9:29 PM  Result Value Ref Range Status   Specimen Description SPUTUM  Final   Special Requests NONE  Final   Sputum evaluation   Final    THIS SPECIMEN IS ACCEPTABLE. RESPIRATORY CULTURE REPORT TO FOLLOW.   Report Status 12/30/2015 FINAL  Final  Culture, respiratory (NON-Expectorated)     Status: None   Collection Time: 12/30/15  9:29 PM  Result Value Ref Range Status   Specimen Description SPUTUM  Final   Special Requests NONE  Final   Gram Stain   Final    FEW WBC PRESENT, PREDOMINANTLY PMN FEW SQUAMOUS EPITHELIAL CELLS PRESENT FEW GRAM POSITIVE COCCI IN PAIRS FEW YEAST Performed at [facility]    Culture   Final    MODERATE YEAST CONSISTENT WITH CANDIDA SPECIES Performed at [facility]    Report Status 01/02/2016 FINAL  Final    Radiology Reports Dg Chest 2 View  12/29/2015  CLINICAL DATA:  Shortness of breath.  COPD exacerbation. EXAM: CHEST  2 VIEW COMPARISON:  08/12/2015 chest radiograph. FINDINGS: Stable cardiomediastinal silhouette with normal heart size. No pneumothorax. No pleural effusion. Emphysema and hyperinflated lungs. No pulmonary edema. Mild hazy opacity in the right lower lobe appears new. IMPRESSION: 1. Mild hazy right lower lobe opacity appears new and suggests right lower lobe pneumonia or aspiration. Recommend follow-up PA and lateral post treatment chest radiographs in 4-6 weeks. 2.  Emphysema and hyperinflated lungs, consistent with the provided history of COPD. Electronically Signed   By: Ilona Sorrel M.D.   On: 12/29/2015 21:08    Lab Data:  CBC:  Recent Labs Lab 01/07/16 0930  WBC 8.8  HGB 10.0*  HCT 30.7*  MCV 83.9  PLT 0000000   Basic Metabolic Panel:  Recent Labs Lab 01/06/16 0618 01/07/16 0930  NA  --  130*  K  --  4.7  CL  --  91*  CO2  --  30  GLUCOSE  --  223*  BUN  --  30*  CREATININE 1.15 1.16  CALCIUM  --  8.6*   GFR: Estimated Creatinine Clearance: 32.4 mL/min (by C-G formula based on Cr of 1.16). Liver Function Tests: No results for input(s): AST, ALT, ALKPHOS, BILITOT, PROT, ALBUMIN in the last 168 hours. No results for input(s): LIPASE, AMYLASE in the last 168 hours. No results for input(s): AMMONIA in the last 168 hours. Coagulation Profile: No results for input(s): INR, PROTIME in the last 168 hours. Cardiac Enzymes: No results for input(s): CKTOTAL, CKMB, CKMBINDEX, TROPONINI in the last 168 hours. BNP (last 3 results) No results for input(s): PROBNP in the last 8760 hours. HbA1C: No results for input(s): HGBA1C in the last 72 hours. CBG: No results for input(s): GLUCAP in the last 168 hours. Lipid Profile: No results for input(s): CHOL, HDL, LDLCALC, TRIG, CHOLHDL, LDLDIRECT in the last 72 hours. Thyroid Function Tests: No results for input(s): TSH, T4TOTAL, FREET4, T3FREE, THYROIDAB in the last 72 hours. Anemia Panel: No results for input(s): VITAMINB12, FOLATE, FERRITIN, TIBC, IRON, RETICCTPCT in the last 72 hours. Urine analysis:    Component Value Date/Time   COLORURINE YELLOW 08/12/2015 2312   APPEARANCEUR CLEAR 08/12/2015 2312   LABSPEC 1.010 08/12/2015 2312   PHURINE 5.0 08/12/2015 [place] 08/12/2015 2312  HGBUR NEGATIVE 08/12/2015 2312   BILIRUBINUR NEGATIVE 08/12/2015 2312   KETONESUR NEGATIVE 08/12/2015 2312   PROTEINUR NEGATIVE 08/12/2015 2312   UROBILINOGEN 0.2 10/08/2014 0838    NITRITE NEGATIVE 08/12/2015 2312   LEUKOCYTESUR NEGATIVE 08/12/2015 2312     Sianni Cloninger M.D. Cristian Collins 01/07/2016, 10:58 AM  Pager: [phone] Between 7am to 7pm - call Pager - [phone]  After 7pm go to [email or web address] - password [email or web address]  Call night coverage person covering after 7pm

## 2016-01-07 NOTE — Progress Notes (Signed)
PULMONARY / CRITICAL CARE MEDICINE   Name: Cristian Collins MRN: [id number] DOB: 1930/04/04    ADMISSION DATE:  12/29/2015 CONSULTATION DATE:   01/06/16   REFERRING MD:  [facility]  CHIEF COMPLAINT:  Sob   HISTORY OF PRESENT ILLNESS:   85 yobm quit smoking 1970's due to cough/ sob got some better then started getting worse again around 2009 referred by [name] 04/07/2012 to pulmonary clinic for sob x 6 months with GOLD III COPD by pfts 05/2012 and last seen in pulm clinic by [facility] 10/24/14 well compensated on 24/7  2lpm/ spiriva/ symb with rare saba but started going downhill again one month pta eval by PC rx with pred/levaquin only a little better then much worse starting 4 d pta with cough/ congestion/sob at rest not responding to saba which had been increased for weeks s much benefit > admit with ? Cap AND PCCM consulted 01/06/16 re copd management.      SUBJECTIVE:  Hoarse anxious bm / c/o nasal congestion   VITAL SIGNS: BP 126/58 mmHg  Pulse 77  Temp(Src) 98.2 F (36.8 C) (Oral)  Resp 17  Ht 5\' 6"  (1.676 m)  Wt 104 lb 11.5 oz (47.5 kg)  BMI 16.91 kg/m2  SpO2 100% on 2lpm              INTAKE / OUTPUT: I/O last 3 completed shifts: In: 300 [P.O.:300] Out: 2400 [Urine:2400]  PHYSICAL EXAMINATION: Elderly very frail thin bm nad  quite hoarse Wt Readings from Last 3 Encounters:  01/07/16 104 lb 11.5 oz (47.5 kg)  08/12/15 103 lb (46.72 kg)  10/24/14 99 lb 6.4 oz (45.088 kg)    Vital signs reviewed  HEENT mild turbinate edema. Edentulous with dentures in place.  Oropharynx no thrush or excess pnd or cobblestoning.  No JVD or cervical adenopathy. Mild accessory muscle hypertrophy. Trachea midline, nl thryroid. Chest was hyperinflated by percussion with diminished breath sounds and moderate increased exp time without wheeze. Hoover sign positive at mid inspiration. Regular rate and rhythm without murmur gallop or rub or increase P2 or edema.  Abd: no hsm, nl  excursion. Ext warm without cyanosis or clubbing.    LABS:  BMET  Recent Labs Lab 01/06/16 0618  CREATININE 1.15    Electrolytes No results for input(s): CALCIUM, MG, PHOS in the last 168 hours.  CBC No results for input(s): WBC, HGB, HCT, PLT in the last 168 hours.  Coag's No results for input(s): APTT, INR in the last 168 hours.  Sepsis Markers No results for input(s): LATICACIDVEN, PROCALCITON, O2SATVEN in the last 168 hours.  ABG No results for input(s): PHART, PCO2ART, PO2ART in the last 168 hours.  Liver Enzymes No results for input(s): AST, ALT, ALKPHOS, BILITOT, ALBUMIN in the last 168 hours.  Cardiac Enzymes No results for input(s): TROPONINI, PROBNP in the last 168 hours.  Glucose No results for input(s): GLUCAP in the last 168 hours.  Imaging No results found.   Micro: Urine Strep 12/30/15  >  POS Urine Legionella 12/30/15 > Neg Sputum 5/20 > yeast only   ABX Zmax  5/19 - 12/31/15  Rocephin 5/19 >>   IMP/ Recs 1) GOLD III criteria/ Group D COPD symptoms relatively stable until 1 month pta - ? Active sinus dz contributing to symptoms > check sinus CT 5/28  - taper steroids down but not off until returns to office w/in 2 weeks of d/c with all meds in hand to regroup re chronic needs  2) Possible strep pna  - rocephin 5/19 > rec complete 10 days on 5/29 - recheck cxr 5/28 >>   3) chronic resp failure with hypoxemia but not hypercarbia -well comp on 2lpm 24/7/ no change in rx     [name], MD Pulmonary and [place] [phone] After 5:30 PM or weekends, call [phone]

## 2016-01-07 NOTE — Consult Note (Deleted)
PULMONARY / CRITICAL CARE MEDICINE   Name: Cristian Collins MRN: [id number] DOB: January 23, 1930    ADMISSION DATE:  12/29/2015 CONSULTATION DATE:   01/06/16   REFERRING MD:  [facility]  CHIEF COMPLAINT:  Sob   HISTORY OF PRESENT ILLNESS:   85 yobm quit smoking 1970's due to cough/ sob got some better then started getting worse again around 2009 referred by [name] 04/07/2012 to pulmonary clinic for sob x 6 months with GOLD III COPD by pfts 05/2012 and last seen in pulm clinic by [facility] 10/24/14 well compensated on 24/7  2lpm/ spiriva/ symb with rare saba but started going downhill again one month pta eval by PC rx with pred/levaquin only a little better then much worse starting 4 d pta with cough/ congestion/sob at rest not responding to saba which had been increased for weeks s much benefit > admit with ? Cap AND PCCM consulted 01/06/16 re copd management.      SUBJECTIVE:  Hoarse anxious bm / c/o nasal congestion   VITAL SIGNS: BP 126/58 mmHg  Pulse 77  Temp(Src) 98.2 F (36.8 C) (Oral)  Resp 17  Ht 5\' 6"  (1.676 m)  Wt 104 lb 11.5 oz (47.5 kg)  BMI 16.91 kg/m2  SpO2 100% on 2lpm              INTAKE / OUTPUT: I/O last 3 completed shifts: In: 300 [P.O.:300] Out: 2400 [Urine:2400]  PHYSICAL EXAMINATION: Elderly very frail thin bm nad  quite hoarse Wt Readings from Last 3 Encounters:  01/07/16 104 lb 11.5 oz (47.5 kg)  08/12/15 103 lb (46.72 kg)  10/24/14 99 lb 6.4 oz (45.088 kg)    Vital signs reviewed  HEENT mild turbinate edema. Edentulous with dentures in place.  Oropharynx no thrush or excess pnd or cobblestoning.  No JVD or cervical adenopathy. Mild accessory muscle hypertrophy. Trachea midline, nl thryroid. Chest was hyperinflated by percussion with diminished breath sounds and moderate increased exp time without wheeze. Hoover sign positive at mid inspiration. Regular rate and rhythm without murmur gallop or rub or increase P2 or edema.  Abd: no hsm, nl  excursion. Ext warm without cyanosis or clubbing.    LABS:  BMET  Recent Labs Lab 01/06/16 0618  CREATININE 1.15    Electrolytes No results for input(s): CALCIUM, MG, PHOS in the last 168 hours.  CBC No results for input(s): WBC, HGB, HCT, PLT in the last 168 hours.  Coag's No results for input(s): APTT, INR in the last 168 hours.  Sepsis Markers No results for input(s): LATICACIDVEN, PROCALCITON, O2SATVEN in the last 168 hours.  ABG No results for input(s): PHART, PCO2ART, PO2ART in the last 168 hours.  Liver Enzymes No results for input(s): AST, ALT, ALKPHOS, BILITOT, ALBUMIN in the last 168 hours.  Cardiac Enzymes No results for input(s): TROPONINI, PROBNP in the last 168 hours.  Glucose No results for input(s): GLUCAP in the last 168 hours.  Imaging No results found.   Micro: Urine Strep 12/30/15  >  POS Urine Legionella 12/30/15 > Neg Sputum 5/20 > yeast only   ABX Zmax  5/19 - 12/31/15  Rocephin 5/19 >>   IMP/ Recs 1) GOLD III criteria/ Group D COPD symptoms relatively stable until 1 month pta - ? Active sinus dz contributing to symptoms > check sinus CT 5/28  - taper steroids down but not off until returns to office w/in 2 weeks of d/c with all meds in hand to regroup re chronic needs  2) Possible strep pna  - rocephin 5/19 > rec complete 10 days on 5/29 - recheck cxr 5/28 >>   3) chronic resp failure with hypoxemia but not hypercarbia -well comp on 2lpm 24/7/ no change in rx     [name], MD Pulmonary and [place] [phone] After 5:30 PM or weekends, call [phone]

## 2016-01-08 LAB — BASIC METABOLIC PANEL
ANION GAP: 8 (ref 5–15)
BUN: 31 mg/dL — AB (ref 6–20)
CHLORIDE: 93 mmol/L — AB (ref 101–111)
CO2: 30 mmol/L (ref 22–32)
Calcium: 8.4 mg/dL — ABNORMAL LOW (ref 8.9–10.3)
Creatinine, Ser: 1.18 mg/dL (ref 0.61–1.24)
GFR calc Af Amer: >60 mL/min (ref >60)
GFR, EST NON AFRICAN AMERICAN: 55 mL/min — AB (ref >60)
GLUCOSE: 153 mg/dL — AB (ref 65–99)
POTASSIUM: 4.7 mmol/L (ref 3.5–5.1)
Sodium: 131 mmol/L — ABNORMAL LOW (ref 135–145)

## 2016-01-08 LAB — CBC
HEMATOCRIT: 29.4 % — AB (ref 39.0–52.0)
HEMOGLOBIN: 9.7 g/dL — AB (ref 13.0–17.0)
MCH: 27.9 pg (ref 26.0–34.0)
MCHC: 33 g/dL (ref 30.0–36.0)
MCV: 84.5 fL (ref 78.0–100.0)
Platelets: 308 10*3/uL (ref 150–400)
RBC: 3.48 MIL/uL — ABNORMAL LOW (ref 4.22–5.81)
RDW: 13.8 % (ref 11.5–15.5)
WBC: 8.8 10*3/uL (ref 4.0–10.5)

## 2016-01-08 MED ORDER — METHYLPREDNISOLONE SODIUM SUCC 40 MG IJ SOLR
40.0000 mg | Freq: Two times a day (BID) | INTRAMUSCULAR | Status: DC
  Administered 2016-01-08 – 2016-01-09 (×4): 40 mg via INTRAVENOUS
  Filled 2016-01-08 (×5): qty 1

## 2016-01-08 NOTE — Care Management Important Message (Signed)
Important Message  Patient Details  Name: Cristian Collins MRN: [id number] Date of Birth: June 16, 1930   Medicare Important Message Given:  Yes    Levenia Skalicky P Julien Oscar 01/08/2016, 10:02 AM

## 2016-01-08 NOTE — Progress Notes (Signed)
PULMONARY / CRITICAL CARE MEDICINE   Name: Cristian Collins MRN: [id number] DOB: [birth year]    ADMISSION DATE:  12/29/2015 CONSULTATION DATE:   01/06/16   REFERRING MD:  [facility]  CHIEF COMPLAINT:  Sob   HISTORY OF PRESENT ILLNESS:   85 yobm quit smoking 1970's due to cough/ sob got some better then started getting worse again around 2009 referred by [name] 04/07/2012 to pulmonary clinic for sob x 6 months with GOLD III COPD by pfts 05/2012 and last seen in pulm clinic by [facility] 10/24/14 well compensated on 24/7  2lpm/ spiriva/ symb with rare saba but started going downhill again one month pta eval by PC rx with pred/levaquin only a little better then much worse starting 4 d pta with cough/ congestion/sob at rest not responding to saba which had been increased for weeks s much benefit > admit with ? Cap AND PCCM consulted 01/06/16 re copd management.      SUBJECTIVE:  Doing better. Still "kinda puny"  Shows me partial thickness burn on thigh from spilled coffee yest.  VITAL SIGNS: BP 127/82 mmHg  Pulse 71  Temp(Src) 97.9 F (36.6 C) (Oral)  Resp 17  Ht 5\' 6"  (1.676 m)  Wt 48.2 kg (106 lb 4.2 oz)  BMI 17.16 kg/m2  SpO2 96% on 2lpm              INTAKE / OUTPUT: I/O last 3 completed shifts: In: 900 [P.O.:900] Out: 2075 [Urine:2075]  PHYSICAL EXAMINATION: Elderly very frail thin bm nad  quite hoarse Wt Readings from Last 3 Encounters:  01/08/16 48.2 kg (106 lb 4.2 oz)  08/12/15 46.72 kg (103 lb)  10/24/14 45.088 kg (99 lb 6.4 oz)    Vital signs reviewed  HEENT mild turbinate edema. Edentulous with dentures in place.  Oropharynx no thrush or excess pnd or cobblestoning.  No JVD or cervical adenopathy. Mild accessory muscle hypertrophy. Trachea midline, nl thryroid. Chest was hyperinflated by percussion with diminished breath sounds and moderate increased exp time without wheeze.Regular rate and rhythm without murmur gallop or rub or increase P2 or edema.  Abd:  no hsm, nl excursion. Ext warm without cyanosis or clubbing.    LABS:  BMET  Recent Labs Lab 01/06/16 0618 01/07/16 0930 01/08/16 0404  NA  --  130* 131*  K  --  4.7 4.7  CL  --  91* 93*  CO2  --  30 30  BUN  --  30* 31*  CREATININE 1.15 1.16 1.18  GLUCOSE  --  223* 153*    Electrolytes  Recent Labs Lab 01/07/16 0930 01/08/16 0404  CALCIUM 8.6* 8.4*    CBC  Recent Labs Lab 01/07/16 0930 01/08/16 0404  WBC 8.8 8.8  HGB 10.0* 9.7*  HCT 30.7* 29.4*  PLT 333 308    Coag's No results for input(s): APTT, INR in the last 168 hours.  Sepsis Markers No results for input(s): LATICACIDVEN, PROCALCITON, O2SATVEN in the last 168 hours.  ABG No results for input(s): PHART, PCO2ART, PO2ART in the last 168 hours.  Liver Enzymes No results for input(s): AST, ALT, ALKPHOS, BILITOT, ALBUMIN in the last 168 hours.  Cardiac Enzymes No results for input(s): TROPONINI, PROBNP in the last 168 hours.  Glucose No results for input(s): GLUCAP in the last 168 hours.  Imaging Dg Chest 2 View  01/07/2016  CLINICAL DATA:  Pt reports he has PNA; he has had a dry cough and SOB for over 1 week; former  smoker; h/o CHF EXAM: CHEST  2 VIEW COMPARISON:  12/29/2015 FINDINGS: Lungs are hyperinflated, clear. The right lower lobe infiltrate seen previously has improved. Heart size normal.  Coronary and aortic calcifications. The visualized skeletal structures are unremarkable. Surgical clips near the GE junction. IMPRESSION: 1. Interval improvement right lower lobe infiltrate. Electronically Signed   By: Lucrezia Europe M.D.   On: 01/07/2016 11:12   [place] Cm  01/07/2016  CLINICAL DATA:  Patient with cough, congestion and hoarseness. EXAM: CT PARANASAL SINUS LIMITED WITHOUT CONTRAST TECHNIQUE: Non-contiguous multidetector CT images of the paranasal sinuses were obtained in a single plane without contrast. COMPARISON:  Brain CT 08/02/2004 FINDINGS: Paranasal sinuses are well  aerated. No significant mucosal thickening. Frontal sinus osteoma. Mastoid air cells unremarkable. Calvarium is intact. Orbits are unremarkable. There is opacification of the left nasolacrimal duct. IMPRESSION: No significant paranasal sinus mucosal thickening. Opacification of the left nasolacrimal duct. Electronically Signed   By: Lovey Newcomer M.D.   On: 01/07/2016 11:35   I reviewed latest CXRs- definite clearing R.  Micro: Urine Strep 12/30/15  >  POS Urine Legionella 12/30/15 > Neg Sputum 5/20 > yeast only   ABX Zmax  5/19 - 12/31/15  Rocephin 5/19 >>01/08/16   IMP/ Recs 1) GOLD III criteria/ Group D COPD symptoms relatively stable until 1 month pta - CT sinus NEG - taper steroids down but not off until returns to office w/in 2 weeks of d/c with all meds in hand to regroup re chronic needs     2) Possible strep pna  - rocephin 5/19 > rec complete 10 days on 5/29. DC'd. - recheck cxr 5/28 cleared- much improved   3) chronic resp failure with hypoxemia but not hypercarbia -well comp on 2lpm 24/7/ no change in rx     CD Grisel Blumenstock, MD, MD Pulmonary and [place] p [phone] After 3:00 pm call [phone]

## 2016-01-08 NOTE — Progress Notes (Signed)
Physical Therapy Treatment Patient Details Name: Cristian Collins MRN: [id number] DOB: 12/23/1929 Today's Date: 01/08/2016    History of Present Illness Pt is a 82 y/o M who presented to the ED w/ progressively worsening cough and dyspnea. Found to have community-acquired RLL streptococcal pneumonia.  Pt's PMh includes COPD, chest pain, anemia, prostate cancer, CAD.     PT Comments    Patient continues to make gradual progress toward mobility goals with increased activity tolerance and decreased need for assist when ambulating. Current plan remains appropriate.   Follow Up Recommendations  Home health PT;Supervision for mobility/OOB     Equipment Recommendations  None recommended by PT    Recommendations for Other Services OT consult     Precautions / Restrictions Precautions Precautions: Fall Precaution Comments: monitor O2 Restrictions Weight Bearing Restrictions: No    Mobility  Bed Mobility               General bed mobility comments: OOB in chair upon arrival  Transfers Overall transfer level: Needs assistance Equipment used: None Transfers: Sit to/from Stand Sit to Stand: Supervision         General transfer comment: supervision for safety; safe hand placement and use of SPC upon stand   Ambulation/Gait Ambulation/Gait assistance: Supervision;Min guard Ambulation Distance (Feet): 200 Feet Assistive device: Straight cane Gait Pattern/deviations: Step-through pattern;Decreased stride length;Narrow base of support Gait velocity: decreased   General Gait Details: cues for posture and cadence; cues initially for use of SPC with carry over demonstrated; grossly supervision with min guard at times due to unsteadiness but no LOB   Stairs Stairs: Yes Stairs assistance: Min guard Stair Management: One rail Left;Forwards Number of Stairs: 3 General stair comments: cues for sequencing and energy conservation  Wheelchair Mobility    Modified Rankin  (Stroke Patients Only)       Balance     Sitting balance-Leahy Scale: Good       Standing balance-Leahy Scale: Fair                      Cognition Arousal/Alertness: Awake/alert Behavior During Therapy: WFL for tasks assessed/performed Overall Cognitive Status: Within Functional Limits for tasks assessed                      Exercises      General Comments General comments (skin integrity, edema, etc.): SpO2 remained 94% to 96% throughout session on 2L O2 via nasal canula; educated pt on breathing technique when SOB with carry over demonstrated      Pertinent Vitals/Pain Pain Assessment: No/denies pain    Home Living                      Prior Function            PT Goals (current goals can now be found in the care plan section) Acute Rehab PT Goals Patient Stated Goal: to go home Progress towards PT goals: Progressing toward goals    Frequency  Min 3X/week    PT Plan Current plan remains appropriate    Co-evaluation             End of Session Equipment Utilized During Treatment: Gait belt;Oxygen (2L) Activity Tolerance: Patient tolerated treatment well Patient left: in chair;with call bell/phone within reach;with chair alarm set     Time: 1202-1232 PT Time Calculation (min) (ACUTE ONLY): 30 min  Charges:  $Gait Training: 8-22 mins $Therapeutic Activity: 8-22 mins  G Codes:      [place] April, PTA Pager: [phone]   01/08/2016, 1:50 PM

## 2016-01-08 NOTE — Progress Notes (Signed)
[facility]                                                                              Patient Demographics  Cristian Collins, is a 82 y.o. male, DOB - 02/11/1930, [id number]  Admit date - 12/29/2015   Admitting Physician [name], MD  Outpatient Primary MD for the patient is [name], MD  Outpatient specialists:   LOS - 9  days    Chief Complaint  Patient presents with  . Shortness of Breath       Brief summary   82 year old male, married, lives with spouse and ambulates with the help of a cane/walker, not on home oxygen, PMH of COPD, HTN, HLD, CAD, chronic chest pain, chronic diastolic CHF, GERD, former smoker, presented to [facility] ED on 12/29/15 with progressively worsening cough and dyspnea of 4 days' duration. He gives chronic, several months history of left sided chest pain which seems atypical-his PCP/cardiologists are aware. Seen by his PCP for itchy rash which is now resolving and was referred to a dermatologist. Admitted for pneumonia-improving. COPD exacerbation on 5/23.   Assessment & Plan    Community-acquired RLL streptococcal pneumonia - Completed IV Rocephin and Zithromax.  -Urine strept antigen positive. Urine Legionella: Negative. - Recommend repeating chest x-ray in 3-4 weeks to ensure resolution of pneumonia findings. -  Sputum culture: Candida  Severe COPD with exacerbationWith chronic respiratory failure on home O2 2 L, end-stage -  on Brovana, Pulmicort, scheduled nebs, flutter valve, IV Solu-Medrol  -  Appreciate pulmonology following - Coughing significantly, placed on Tessalon Perles, Robitussin, Cepacol  - CXR 5/28  showed interval improvement of right lower lobe infiltrate  - CT maxillofacial showed no significant paranasal sinus mucosal thickening, paced patient of the left nasolacrimal duct - poor improvement in the last several days, despite maximal treatment, overall poor prognosis, will consult palliative for  goals of care and may benefit from hospice  Atypical chest pain/chronic chest pain syndrome- currently stable - Apparently has had it for several months. He states that his PCP and primary cardiologist (Dr. [name]) are aware. One of 3 troponins minimally elevated but the third and last 1 was negative. EKG without acute findings. - [name] discussed with patient's primary cardiologist. Patient has chronic chest pain syndrome. Cardiac cath had shown patent RCA stent and no other abnormalities. - Continue aspirin, statins and nitrates.  Essential hypertension Controlled   Chronic diastolic CHF - Compensated. LVEF of January 2017: 65-70 percent.  - Continue home dose of Lasix & Aldactone.  Hyperlipidemia  - Statins  Chronic anemia - Stable.  GERD - PPI  Moderate protein calorie Malnutrition - Continue nutritional supplements   Code Status: Full code   DVT Prophylaxis:  Lovenox    Family Communication: Discussed in detail with the patient, all imaging results, lab results explained to the patient   Disposition Plan:   Time Spent in minutes  15 minutes  Procedures:  None  Consults Pulmonology  Antimicrobials:   IV Rocephin  Azithromycin -discontinued   Medications  Scheduled Meds: . amLODipine  10 mg Oral Daily  . antiseptic oral rinse  7 mL Mouth Rinse BID  . arformoterol  15 mcg Nebulization BID  . aspirin EC  81 mg Oral Daily  . benzonatate  200 mg Oral TID  . budesonide (PULMICORT) nebulizer solution  0.25 mg Nebulization BID  . enoxaparin (LOVENOX) injection  30 mg Subcutaneous Q24H  . feeding supplement (ENSURE ENLIVE)  237 mL Oral BID PC  . furosemide  20 mg Oral BID  . ipratropium-albuterol  3 mL Nebulization TID  . isosorbide mononitrate  60 mg Oral Daily  . methylPREDNISolone (SOLU-MEDROL) injection  40 mg Intravenous Q12H  . pantoprazole  40 mg Oral Daily  . simvastatin  20 mg Oral Daily  . sodium chloride flush  3 mL Intravenous Q12H  .  spironolactone  25 mg Oral Daily   Continuous Infusions:  PRN Meds:.acetaminophen **OR** acetaminophen, albuterol, guaiFENesin-codeine, menthol-cetylpyridinium, nitroGLYCERIN, ondansetron **OR** ondansetron (ZOFRAN) IV, traMADol   Antibiotics   Anti-infectives    Start     Dose/Rate Route Frequency Ordered Stop   01/01/16 0015  azithromycin (ZITHROMAX) tablet 500 mg     500 mg Oral  Once 01/01/16 0009 01/01/16 0152   12/31/15 2000  cefTRIAXone (ROCEPHIN) 2 g in dextrose 5 % 50 mL IVPB     2 g 100 mL/hr over 30 Minutes Intravenous Every 24 hours 12/31/15 1403 01/05/16 2226   12/30/15 2200  azithromycin (ZITHROMAX) 500 mg in dextrose 5 % 250 mL IVPB  Status:  Discontinued     500 mg 250 mL/hr over 60 Minutes Intravenous Every 24 hours 12/30/15 0044 01/01/16 1631   12/30/15 2000  cefTRIAXone (ROCEPHIN) 1 g in dextrose 5 % 50 mL IVPB  Status:  Discontinued     1 g 100 mL/hr over 30 Minutes Intravenous Every 24 hours 12/30/15 0044 12/31/15 1403   12/29/15 2130  azithromycin (ZITHROMAX) 500 mg in dextrose 5 % 250 mL IVPB     500 mg 250 mL/hr over 60 Minutes Intravenous  Once 12/29/15 2125 12/29/15 2329   12/29/15 2130  cefTRIAXone (ROCEPHIN) 1 g in dextrose 5 % 50 mL IVPB     1 g 100 mL/hr over 30 Minutes Intravenous  Once 12/29/15 2125 12/29/15 2309        Subjective:   Cristian Collins was seen and examined today. Continues to have diffuse wheezing, coughing, overall not much significant improvement.  Coughing a lot otherwise wheezing slightly better from yesterday. No fevers or chills. Patient denies dizziness, abdominal pain, N/V/D/C, new weakness, numbess, tingling. No acute events overnight.    Objective:   Filed Vitals:   01/07/16 1443 01/07/16 2141 01/08/16 0434 01/08/16 0500  BP:  154/55 127/82   Pulse:  81 71   Temp:  97.8 F (36.6 C) 97.9 F (36.6 C)   TempSrc:  Oral Oral   Resp:  17 17   Height:      Weight:    48.2 kg (106 lb 4.2 oz)  SpO2: 100% 100% 96%      Intake/Output Summary (Last 24 hours) at 01/08/16 1101 Last data filed at 01/08/16 0959  Gross per 24 hour  Intake   1030 ml  Output    575 ml  Net    455 ml     Wt Readings from Last 3 Encounters:  01/08/16 48.2 kg (106 lb 4.2 oz)  08/12/15 46.72 kg (103 lb)  10/24/14 45.088 kg (99 lb 6.4 oz)     Exam  General: Alert and oriented x 3, NAD  HEENT:  .  Neck:   Cardiovascular: S1 S2 Clear, RRR  Respiratory: Diffuse expiratory wheezing bilaterally  gastrointestinal: Soft, NT, ND, NBS  Ext: no cyanosis clubbing or edema  Neuro: no new neuro deficits  Skin: No rashes  Psych: Normal affect and demeanor, alert and oriented x3    Data Reviewed:  I have personally reviewed following labs and imaging studies  Micro Results Recent Results (from the past 240 hour(s))  Culture, sputum-assessment     Status: None   Collection Time: 12/30/15  9:29 PM  Result Value Ref Range Status   Specimen Description SPUTUM  Final   Special Requests NONE  Final   Sputum evaluation   Final    THIS SPECIMEN IS ACCEPTABLE. RESPIRATORY CULTURE REPORT TO FOLLOW.   Report Status 12/30/2015 FINAL  Final  Culture, respiratory (NON-Expectorated)     Status: None   Collection Time: 12/30/15  9:29 PM  Result Value Ref Range Status   Specimen Description SPUTUM  Final   Special Requests NONE  Final   Gram Stain   Final    FEW WBC PRESENT, PREDOMINANTLY PMN FEW SQUAMOUS EPITHELIAL CELLS PRESENT FEW GRAM POSITIVE COCCI IN PAIRS FEW YEAST Performed at [facility]    Culture   Final    MODERATE YEAST CONSISTENT WITH CANDIDA SPECIES Performed at [facility]    Report Status 01/02/2016 FINAL  Final    Radiology Reports Dg Chest 2 View  01/07/2016  CLINICAL DATA:  Pt reports he has PNA; he has had a dry cough and SOB for over 1 week; former smoker; h/o CHF EXAM: CHEST  2 VIEW COMPARISON:  12/29/2015 FINDINGS: Lungs are hyperinflated, clear. The right lower lobe  infiltrate seen previously has improved. Heart size normal.  Coronary and aortic calcifications. The visualized skeletal structures are unremarkable. Surgical clips near the GE junction. IMPRESSION: 1. Interval improvement right lower lobe infiltrate. Electronically Signed   By: Lucrezia Europe M.D.   On: 01/07/2016 11:12   Dg Chest 2 View  12/29/2015  CLINICAL DATA:  Shortness of breath.  COPD exacerbation. EXAM: CHEST  2 VIEW COMPARISON:  08/12/2015 chest radiograph. FINDINGS: Stable cardiomediastinal silhouette with normal heart size. No pneumothorax. No pleural effusion. Emphysema and hyperinflated lungs. No pulmonary edema. Mild hazy opacity in the right lower lobe appears new. IMPRESSION: 1. Mild hazy right lower lobe opacity appears new and suggests right lower lobe pneumonia or aspiration. Recommend follow-up PA and lateral post treatment chest radiographs in 4-6 weeks. 2. Emphysema and hyperinflated lungs, consistent with the provided history of COPD. Electronically Signed   By: Ilona Sorrel M.D.   On: 12/29/2015 21:08   [place] Cm  01/07/2016  CLINICAL DATA:  Patient with cough, congestion and hoarseness. EXAM: CT PARANASAL SINUS LIMITED WITHOUT CONTRAST TECHNIQUE: Non-contiguous multidetector CT images of the paranasal sinuses were obtained in a single plane without contrast. COMPARISON:  Brain CT 08/02/2004 FINDINGS: Paranasal sinuses are well aerated. No significant mucosal thickening. Frontal sinus osteoma. Mastoid air cells unremarkable. Calvarium is intact. Orbits are unremarkable. There is opacification of the left nasolacrimal duct. IMPRESSION: No significant paranasal sinus mucosal thickening. Opacification of the left nasolacrimal duct. Electronically Signed   By: Lovey Newcomer M.D.   On: 01/07/2016 11:35    Lab Data:  CBC:  Recent Labs Lab 01/07/16 0930 01/08/16 0404  WBC 8.8 8.8  HGB 10.0* 9.7*  HCT 30.7* 29.4*  MCV 83.9 84.5  PLT 333 [id number]   Basic Metabolic  Panel:  Recent Labs Lab 01/06/16 0618 01/07/16 0930 01/08/16 0404  NA  --  130* 131*  K  --  4.7 4.7  CL  --  91* 93*  CO2  --  30 30  GLUCOSE  --  223* 153*  BUN  --  30* 31*  CREATININE 1.15 1.16 1.18  CALCIUM  --  8.6* 8.4*   GFR: Estimated Creatinine Clearance: 32.3 mL/min (by C-G formula based on Cr of 1.18). Liver Function Tests: No results for input(s): AST, ALT, ALKPHOS, BILITOT, PROT, ALBUMIN in the last 168 hours. No results for input(s): LIPASE, AMYLASE in the last 168 hours. No results for input(s): AMMONIA in the last 168 hours. Coagulation Profile: No results for input(s): INR, PROTIME in the last 168 hours. Cardiac Enzymes: No results for input(s): CKTOTAL, CKMB, CKMBINDEX, TROPONINI in the last 168 hours. BNP (last 3 results) No results for input(s): PROBNP in the last 8760 hours. HbA1C: No results for input(s): HGBA1C in the last 72 hours. CBG: No results for input(s): GLUCAP in the last 168 hours. Lipid Profile: No results for input(s): CHOL, HDL, LDLCALC, TRIG, CHOLHDL, LDLDIRECT in the last 72 hours. Thyroid Function Tests: No results for input(s): TSH, T4TOTAL, FREET4, T3FREE, THYROIDAB in the last 72 hours. Anemia Panel: No results for input(s): VITAMINB12, FOLATE, FERRITIN, TIBC, IRON, RETICCTPCT in the last 72 hours. Urine analysis:    Component Value Date/Time   COLORURINE YELLOW 08/12/2015 2312   APPEARANCEUR CLEAR 08/12/2015 2312   LABSPEC 1.010 08/12/2015 2312   PHURINE 5.0 08/12/2015 2312   GLUCOSEU NEGATIVE 08/12/2015 2312   HGBUR NEGATIVE 08/12/2015 2312   BILIRUBINUR NEGATIVE 08/12/2015 2312   KETONESUR NEGATIVE 08/12/2015 2312   PROTEINUR NEGATIVE 08/12/2015 2312   UROBILINOGEN 0.2 10/08/2014 0838   NITRITE NEGATIVE 08/12/2015 2312   LEUKOCYTESUR NEGATIVE 08/12/2015 2312     RAI,RIPUDEEP M.D. [facility] 01/08/2016, 11:01 AM  Pager: [phone] Between 7am to 7pm - call Pager - [phone]  After 7pm go to  [email or web address] - password [email or web address]  Call night coverage person covering after 7pm

## 2016-01-09 DIAGNOSIS — Z515 Encounter for palliative care: Secondary | ICD-10-CM | POA: Insufficient documentation

## 2016-01-09 DIAGNOSIS — Z7189 Other specified counseling: Secondary | ICD-10-CM

## 2016-01-09 DIAGNOSIS — J13 Pneumonia due to Streptococcus pneumoniae: Secondary | ICD-10-CM

## 2016-01-09 NOTE — Progress Notes (Signed)
Nutrition Follow-up  DOCUMENTATION CODES:   Severe malnutrition in context of chronic illness, Underweight  INTERVENTION:   -Continue Ensure Enlive po BID, each supplement provides 350 kcal and 20 grams of protein  NUTRITION DIAGNOSIS:   Malnutrition related to chronic illness as evidenced by severe depletion of body fat, severe depletion of muscle mass.  Ongoing  GOAL:   Patient will meet greater than or equal to 90% of their needs  Progressing  MONITOR:   PO intake, Supplement acceptance, Labs, Weight trends, Skin, I & O's  REASON FOR ASSESSMENT:   Consult Assessment of nutrition requirement/status  ASSESSMENT:   Cristian Collins is a 82 y.o. male with medical history significant of COPD, CAD status post stenting, hypertension, CHF, chronic anemia presents to the ER because of shortness of breath. Patient has been having shortness of breath over the last 4 days which has been gradually worsening with productive cough.  Pt very cheerful this morning, just completed breakfast tray at time of visit.   Pt reports his appetite continues to improve. Noted meal completion 50-100%. Pt consumed about 50% of his breakfast tray this AM. Pt reports he continues to consume Ensure supplements; noted he consumed about 50% of bottle of Ensure on tray table. Pt amenable to continue to consume supplements.   Case discussed with RN. She reports pt continues to have a lot of wheezing in the morning, but improves throughout the day. She reports palliative care consult has been ordered.   Labs reviewed: Na: 146.  Diet Order:  Diet Heart Room service appropriate?: Yes; Fluid consistency:: Thin; Fluid restriction:: 1200 mL Fluid  Skin:  Reviewed, no issues  Last BM:  01/08/16  Height:   Ht Readings from Last 1 Encounters:  12/29/15 5\' 6"  (1.676 m)    Weight:   Wt Readings from Last 1 Encounters:  01/08/16 106 lb 4.2 oz (48.2 kg)    Ideal Body Weight:  64.5 kg  BMI:  Body mass  index is 17.16 kg/(m^2).  Estimated Nutritional Needs:   Kcal:  1500-1700  Protein:  70-85 grams  Fluid:  1.5-1.7 L  EDUCATION NEEDS:   Education needs addressed  Shaun Zuccaro A. Jimmye Norman, RD, LDN, CDE Pager: [phone] After hours Pager: [phone]

## 2016-01-09 NOTE — Progress Notes (Signed)
UR COMPLETED  

## 2016-01-09 NOTE — Progress Notes (Signed)
[facility]                                                                              Patient Demographics  Cristian Collins, is a 82 y.o. male, DOB - [birth year], [id number]  Admit date - 12/29/2015   Admitting Physician [name], MD  Outpatient Primary MD for the patient is [name], MD  Outpatient specialists:   LOS - 10  days    Chief Complaint  Patient presents with  . Shortness of Breath       Brief summary   82 year old male, married, lives with spouse and ambulates with the help of a cane/walker, not on home oxygen, PMH of COPD, HTN, HLD, CAD, chronic chest pain, chronic diastolic CHF, GERD, former smoker, presented to [facility] ED on 12/29/15 with progressively worsening cough and dyspnea of 4 days' duration. He gives chronic, several months history of left sided chest pain which seems atypical-his PCP/cardiologists are aware. Seen by his PCP for itchy rash which is now resolving and was referred to a dermatologist. Admitted for pneumonia-improving. COPD exacerbation on 5/23.   Assessment & Plan    Community-acquired RLL streptococcal pneumonia - Completed IV Rocephin and Zithromax.  -Urine strept antigen positive. Urine Legionella: Negative. - Recommend repeating chest x-ray in 3-4 weeks to ensure resolution of pneumonia findings. -  Sputum culture: Candida  Severe COPD with exacerbationWith chronic respiratory failure on home O2 2 L, end-stage -  on Brovana, Pulmicort, scheduled nebs, flutter valve, IV Solu-Medrol  -  Appreciate pulmonology following - Coughing significantly, placed on Tessalon Perles, Robitussin, Cepacol  - CXR 5/28  showed interval improvement of right lower lobe infiltrate  - CT maxillofacial showed no significant paranasal sinus mucosal thickening, paced patient of the left nasolacrimal duct - poor improvement in the last several days, despite maximal treatment, overall poor prognosis, Palliative medicine  consulted for goals of care and may benefit from hospice  Atypical chest pain/chronic chest pain syndrome- currently stable - Apparently has had it for several months. He states that his PCP and primary cardiologist (Dr. [name]) are aware. One of 3 troponins minimally elevated but the third and last 1 was negative. EKG without acute findings. - [name] discussed with patient's primary cardiologist. Patient has chronic chest pain syndrome. Cardiac cath had shown patent RCA stent and no other abnormalities. - Continue aspirin, statins and nitrates.  Essential hypertension Controlled   Chronic diastolic CHF - Compensated. LVEF of January 2017: 65-70 percent.  - Continue home dose of Lasix & Aldactone.  Hyperlipidemia  - Statins  Chronic anemia - Stable.  GERD - PPI  Moderate protein calorie Malnutrition - Continue nutritional supplements   Code Status: Full code   DVT Prophylaxis:  Lovenox    Family Communication: Discussed in detail with the patient, all imaging results, lab results explained to the patient   Disposition Plan: Hopefully DC in 24 hours if hospice can be arranged or patient is improving  Time Spent in minutes  15 minutes  Procedures:  None  Consults Pulmonology  Antimicrobials:   IV Rocephin  Azithromycin -discontinued   Medications  Scheduled Meds: .  amLODipine  10 mg Oral Daily  . antiseptic oral rinse  7 mL Mouth Rinse BID  . arformoterol  15 mcg Nebulization BID  . aspirin EC  81 mg Oral Daily  . benzonatate  200 mg Oral TID  . budesonide (PULMICORT) nebulizer solution  0.25 mg Nebulization BID  . enoxaparin (LOVENOX) injection  30 mg Subcutaneous Q24H  . feeding supplement (ENSURE ENLIVE)  237 mL Oral BID PC  . furosemide  20 mg Oral BID  . ipratropium-albuterol  3 mL Nebulization TID  . isosorbide mononitrate  60 mg Oral Daily  . methylPREDNISolone (SOLU-MEDROL) injection  40 mg Intravenous Q12H  . pantoprazole  40 mg Oral  Daily  . simvastatin  20 mg Oral Daily  . sodium chloride flush  3 mL Intravenous Q12H  . spironolactone  25 mg Oral Daily   Continuous Infusions:  PRN Meds:.acetaminophen **OR** acetaminophen, albuterol, guaiFENesin-codeine, menthol-cetylpyridinium, nitroGLYCERIN, ondansetron **OR** ondansetron (ZOFRAN) IV, traMADol   Antibiotics   Anti-infectives    Start     Dose/Rate Route Frequency Ordered Stop   01/01/16 0015  azithromycin (ZITHROMAX) tablet 500 mg     500 mg Oral  Once 01/01/16 0009 01/01/16 0152   12/31/15 2000  cefTRIAXone (ROCEPHIN) 2 g in dextrose 5 % 50 mL IVPB     2 g 100 mL/hr over 30 Minutes Intravenous Every 24 hours 12/31/15 1403 01/05/16 2226   12/30/15 2200  azithromycin (ZITHROMAX) 500 mg in dextrose 5 % 250 mL IVPB  Status:  Discontinued     500 mg 250 mL/hr over 60 Minutes Intravenous Every 24 hours 12/30/15 0044 01/01/16 1631   12/30/15 2000  cefTRIAXone (ROCEPHIN) 1 g in dextrose 5 % 50 mL IVPB  Status:  Discontinued     1 g 100 mL/hr over 30 Minutes Intravenous Every 24 hours 12/30/15 0044 12/31/15 1403   12/29/15 2130  azithromycin (ZITHROMAX) 500 mg in dextrose 5 % 250 mL IVPB     500 mg 250 mL/hr over 60 Minutes Intravenous  Once 12/29/15 2125 12/29/15 2329   12/29/15 2130  cefTRIAXone (ROCEPHIN) 1 g in dextrose 5 % 50 mL IVPB     1 g 100 mL/hr over 30 Minutes Intravenous  Once 12/29/15 2125 12/29/15 2309        Subjective:   Cristian Collins was seen and examined today. Had a rough night, pursed lip breathing with diffuse wheezing, coughing. No fevers or chills. Patient denies dizziness, abdominal pain, N/V/D/C, new weakness, numbess, tingling. No acute events overnight.    Objective:   Filed Vitals:   01/09/16 0507 01/09/16 0734 01/09/16 0735 01/09/16 0736  BP: 126/42     Pulse: 71     Temp: 97.6 F (36.4 C)     TempSrc: Oral     Resp: 20     Height:      Weight:      SpO2: 100% 98% 98% 98%    Intake/Output Summary (Last 24 hours) at  01/09/16 1047 Last data filed at 01/09/16 1033  Gross per 24 hour  Intake    370 ml  Output    675 ml  Net   -305 ml     Wt Readings from Last 3 Encounters:  01/08/16 48.2 kg (106 lb 4.2 oz)  08/12/15 46.72 kg (103 lb)  10/24/14 45.088 kg (99 lb 6.4 oz)     Exam  General: Alert and oriented x 3, NAD, able to talk, pursed lip breathing  HEENT:  .  Neck:   Cardiovascular: S1 S2 Clear, RRR  Respiratory: Diffuse expiratory wheezing bilaterally  gastrointestinal: Soft, NT, ND, NBS  Ext: no cyanosis clubbing or edema  Neuro: no new neuro deficits  Skin: No rashes  Psych: Normal affect and demeanor, alert and oriented x3    Data Reviewed:  I have personally reviewed following labs and imaging studies  Micro Results Recent Results (from the past 240 hour(s))  Culture, sputum-assessment     Status: None   Collection Time: 12/30/15  9:29 PM  Result Value Ref Range Status   Specimen Description SPUTUM  Final   Special Requests NONE  Final   Sputum evaluation   Final    THIS SPECIMEN IS ACCEPTABLE. RESPIRATORY CULTURE REPORT TO FOLLOW.   Report Status 12/30/2015 FINAL  Final  Culture, respiratory (NON-Expectorated)     Status: None   Collection Time: 12/30/15  9:29 PM  Result Value Ref Range Status   Specimen Description SPUTUM  Final   Special Requests NONE  Final   Gram Stain   Final    FEW WBC PRESENT, PREDOMINANTLY PMN FEW SQUAMOUS EPITHELIAL CELLS PRESENT FEW GRAM POSITIVE COCCI IN PAIRS FEW YEAST Performed at [facility]    Culture   Final    MODERATE YEAST CONSISTENT WITH CANDIDA SPECIES Performed at [facility]    Report Status 01/02/2016 FINAL  Final    Radiology Reports Dg Chest 2 View  01/07/2016  CLINICAL DATA:  Pt reports he has PNA; he has had a dry cough and SOB for over 1 week; former smoker; h/o CHF EXAM: CHEST  2 VIEW COMPARISON:  12/29/2015 FINDINGS: Lungs are hyperinflated, clear. The right lower lobe infiltrate seen  previously has improved. Heart size normal.  Coronary and aortic calcifications. The visualized skeletal structures are unremarkable. Surgical clips near the GE junction. IMPRESSION: 1. Interval improvement right lower lobe infiltrate. Electronically Signed   By: Lucrezia Europe M.D.   On: 01/07/2016 11:12   Dg Chest 2 View  12/29/2015  CLINICAL DATA:  Shortness of breath.  COPD exacerbation. EXAM: CHEST  2 VIEW COMPARISON:  08/12/2015 chest radiograph. FINDINGS: Stable cardiomediastinal silhouette with normal heart size. No pneumothorax. No pleural effusion. Emphysema and hyperinflated lungs. No pulmonary edema. Mild hazy opacity in the right lower lobe appears new. IMPRESSION: 1. Mild hazy right lower lobe opacity appears new and suggests right lower lobe pneumonia or aspiration. Recommend follow-up PA and lateral post treatment chest radiographs in 4-6 weeks. 2. Emphysema and hyperinflated lungs, consistent with the provided history of COPD. Electronically Signed   By: Ilona Sorrel M.D.   On: 12/29/2015 21:08   [place] Cm  01/07/2016  CLINICAL DATA:  Patient with cough, congestion and hoarseness. EXAM: CT PARANASAL SINUS LIMITED WITHOUT CONTRAST TECHNIQUE: Non-contiguous multidetector CT images of the paranasal sinuses were obtained in a single plane without contrast. COMPARISON:  Brain CT 08/02/2004 FINDINGS: Paranasal sinuses are well aerated. No significant mucosal thickening. Frontal sinus osteoma. Mastoid air cells unremarkable. Calvarium is intact. Orbits are unremarkable. There is opacification of the left nasolacrimal duct. IMPRESSION: No significant paranasal sinus mucosal thickening. Opacification of the left nasolacrimal duct. Electronically Signed   By: Lovey Newcomer M.D.   On: 01/07/2016 11:35    Lab Data:  CBC:  Recent Labs Lab 01/07/16 0930 01/08/16 0404  WBC 8.8 8.8  HGB 10.0* 9.7*  HCT 30.7* 29.4*  MCV 83.9 84.5  PLT 333 [id number]   Basic Metabolic Panel:  Recent  Labs Lab 01/06/16 0618 01/07/16 0930 01/08/16 0404  NA  --  130* 131*  K  --  4.7 4.7  CL  --  91* 93*  CO2  --  30 30  GLUCOSE  --  223* 153*  BUN  --  30* 31*  CREATININE 1.15 1.16 1.18  CALCIUM  --  8.6* 8.4*   GFR: Estimated Creatinine Clearance: 32.3 mL/min (by C-G formula based on Cr of 1.18). Liver Function Tests: No results for input(s): AST, ALT, ALKPHOS, BILITOT, PROT, ALBUMIN in the last 168 hours. No results for input(s): LIPASE, AMYLASE in the last 168 hours. No results for input(s): AMMONIA in the last 168 hours. Coagulation Profile: No results for input(s): INR, PROTIME in the last 168 hours. Cardiac Enzymes: No results for input(s): CKTOTAL, CKMB, CKMBINDEX, TROPONINI in the last 168 hours. BNP (last 3 results) No results for input(s): PROBNP in the last 8760 hours. HbA1C: No results for input(s): HGBA1C in the last 72 hours. CBG: No results for input(s): GLUCAP in the last 168 hours. Lipid Profile: No results for input(s): CHOL, HDL, LDLCALC, TRIG, CHOLHDL, LDLDIRECT in the last 72 hours. Thyroid Function Tests: No results for input(s): TSH, T4TOTAL, FREET4, T3FREE, THYROIDAB in the last 72 hours. Anemia Panel: No results for input(s): VITAMINB12, FOLATE, FERRITIN, TIBC, IRON, RETICCTPCT in the last 72 hours. Urine analysis:    Component Value Date/Time   COLORURINE YELLOW 08/12/2015 2312   APPEARANCEUR CLEAR 08/12/2015 2312   LABSPEC 1.010 08/12/2015 2312   PHURINE 5.0 08/12/2015 2312   GLUCOSEU NEGATIVE 08/12/2015 2312   HGBUR NEGATIVE 08/12/2015 2312   BILIRUBINUR NEGATIVE 08/12/2015 2312   KETONESUR NEGATIVE 08/12/2015 2312   PROTEINUR NEGATIVE 08/12/2015 2312   UROBILINOGEN 0.2 10/08/2014 0838   NITRITE NEGATIVE 08/12/2015 2312   LEUKOCYTESUR NEGATIVE 08/12/2015 2312     Jaiyon Wander M.D. [facility] 01/09/2016, 10:47 AM  Pager: [phone] Between 7am to 7pm - call Pager - [phone]  After 7pm go to [email or web address] - password  [email or web address]  Call night coverage person covering after 7pm

## 2016-01-09 NOTE — Progress Notes (Signed)
PULMONARY / CRITICAL CARE MEDICINE   Name: Cristian Collins MRN: [id number] DOB: 09/23/1929    ADMISSION DATE:  12/29/2015 CONSULTATION DATE:   01/06/16   REFERRING MD:  [facility]  CHIEF COMPLAINT:  Sob   HISTORY OF PRESENT ILLNESS:   85 yobm quit smoking 1970's due to cough/ sob got some better then started getting worse again around 2009 referred by [name] 04/07/2012 to pulmonary clinic for sob x 6 months with GOLD III COPD by pfts 05/2012 and last seen in pulm clinic by [facility] 10/24/14 well compensated on 24/7  2lpm/ spiriva/ symb with rare saba but started going downhill again one month pta eval by PC rx with pred/levaquin only a little better then much worse starting 4 d pta with cough/ congestion/sob at rest not responding to saba which had been increased for weeks s much benefit > admit with ? Cap AND PCCM consulted 01/06/16 re copd management.      SUBJECTIVE:  Doing better. 70% better compared to when he came in. Less cough and SOB. On o2 2L 24/7 > baseline.     VITAL SIGNS: BP 126/42 mmHg  Pulse 71  Temp(Src) 97.6 F (36.4 C) (Oral)  Resp 20  Ht 5\' 6"  (1.676 m)  Wt 48.2 kg (106 lb 4.2 oz)  BMI 17.16 kg/m2  SpO2 98% on 2lpm              INTAKE / OUTPUT: I/O last 3 completed shifts: In: 60 [P.O.:820] Out: 950 [Urine:950]  PHYSICAL EXAMINATION: Elderly very frail thin bm nad  quite hoarse Wt Readings from Last 3 Encounters:  01/08/16 48.2 kg (106 lb 4.2 oz)  08/12/15 46.72 kg (103 lb)  10/24/14 45.088 kg (99 lb 6.4 oz)    Vital signs reviewed. VSS. Afebrile.   HEENT mild turbinate edema. Edentulous with dentures in place.  Oropharynx no thrush or excess pnd or cobblestoning.  No JVD or cervical adenopathy. Mild accessory muscle hypertrophy. Trachea midline, nl thryroid. Chest was hyperinflated by percussion with diminished breath sounds and moderate increased exp time without wheeze.Regular rate and rhythm without murmur gallop or rub or increase  P2 or edema.  Abd: no hsm, nl excursion. Ext warm without cyanosis or clubbing.    LABS:  BMET  Recent Labs Lab 01/06/16 0618 01/07/16 0930 01/08/16 0404  NA  --  130* 131*  K  --  4.7 4.7  CL  --  91* 93*  CO2  --  30 30  BUN  --  30* 31*  CREATININE 1.15 1.16 1.18  GLUCOSE  --  223* 153*    Electrolytes  Recent Labs Lab 01/07/16 0930 01/08/16 0404  CALCIUM 8.6* 8.4*    CBC  Recent Labs Lab 01/07/16 0930 01/08/16 0404  WBC 8.8 8.8  HGB 10.0* 9.7*  HCT 30.7* 29.4*  PLT 333 308    Coag's No results for input(s): APTT, INR in the last 168 hours.  Sepsis Markers No results for input(s): LATICACIDVEN, PROCALCITON, O2SATVEN in the last 168 hours.  ABG No results for input(s): PHART, PCO2ART, PO2ART in the last 168 hours.  Liver Enzymes No results for input(s): AST, ALT, ALKPHOS, BILITOT, ALBUMIN in the last 168 hours.  Cardiac Enzymes No results for input(s): TROPONINI, PROBNP in the last 168 hours.  Glucose No results for input(s): GLUCAP in the last 168 hours.  Imaging No results found. I reviewed latest CXRs- definite clearing R.  Micro: Urine Strep 12/30/15  >  POS  Urine Legionella 12/30/15 > Neg Sputum 5/20 > yeast only   ABX Zmax  5/19 - 12/31/15  Rocephin 5/19 >>01/08/16   IMP/ Recs 1) GOLD III criteria/ Group D COPD symptoms relatively stable until 1 month pta - CT sinus NEG - taper steroids down but not off until returns to office w/in 2 weeks of d/c with all meds in hand to regroup re chronic needs     2) Possible strep pna  - rocephin 5/19 > rec complete 10 days on 5/29. DC'd. - recheck cxr 5/28 cleared- much improved   3) chronic resp failure with hypoxemia but not hypercarbia -well comp on 2lpm 24/7/ no change in rx     Anticipate d/c in the next 24-48 hrs.  Pred taper until seen by Pulmonary office > needs a f/u in 1-2 weeks.  Cont oupt COPD meds. Cont O2, 2L 24/7.    PCCM service will sign off for now.  Call us back  if with questions.    Monica Becton, MD 01/09/2016, 2:05 PM [facility] Pager [phone] After 3 pm or if no answer, call [phone]

## 2016-01-09 NOTE — Progress Notes (Addendum)
CM received consult:   Palliative Medicine follow up outpatient.  Requested by daughter Royce Macadamia.  CM spoke with pt @ bedside  and daughter (via phone) regarding outpatient Palliative Medicine follow up . [facility] called  for outpatient palliative medicine and referral made with [facility] @ [phone] by CM. Whitman Hero RN,BSN,CM [phone]

## 2016-01-09 NOTE — Progress Notes (Signed)
Physical Therapy Treatment Patient Details Name: Cristian Collins MRN: [id number] DOB: 09-17-1929 Today's Date: 01/09/2016    History of Present Illness Pt is a 82 y/o M who presented to the ED w/ progressively worsening cough and dyspnea. Found to have community-acquired RLL streptococcal pneumonia.  Pt's PMh includes COPD, chest pain, anemia, prostate cancer, CAD.     PT Comments    Assisted with amb a great distance on 2 lts nasal and using personal straight cane.  Progressing well.  Follow Up Recommendations  Home health PT;Supervision for mobility/OOB     Equipment Recommendations  None recommended by PT    Recommendations for Other Services       Precautions / Restrictions Precautions Precautions: Fall Precaution Comments: home O2   2lts Restrictions Weight Bearing Restrictions: No    Mobility  Bed Mobility               General bed mobility comments: OOB in chair upon arrival  Transfers Overall transfer level: Needs assistance Equipment used: None Transfers: Sit to/from Stand Sit to Stand: Supervision         General transfer comment: supervision for safety; safe hand placement and use of SPC upon stand   Ambulation/Gait Ambulation/Gait assistance: Supervision;Min guard Ambulation Distance (Feet): 185 Feet Assistive device: Straight cane Gait Pattern/deviations: Step-through pattern;Decreased stride length Gait velocity: decreased   General Gait Details: cues for posture and cadence; cues initially for use of SPC with carry over demonstrated; grossly supervision with min guard at times due to unsteadiness but no LOB.  AVG sats 95%   Stairs            Wheelchair Mobility    Modified Rankin (Stroke Patients Only)       Balance                                    Cognition Arousal/Alertness: Awake/alert Behavior During Therapy: WFL for tasks assessed/performed Overall Cognitive Status: Within Functional Limits for  tasks assessed                      Exercises      General Comments        Pertinent Vitals/Pain Pain Assessment: No/denies pain    Home Living                      Prior Function            PT Goals (current goals can now be found in the care plan section) Progress towards PT goals: Progressing toward goals    Frequency  Min 3X/week    PT Plan Current plan remains appropriate    Co-evaluation             End of Session Equipment Utilized During Treatment: Gait belt;Oxygen Activity Tolerance: Patient tolerated treatment well       Time: [id number] PT Time Calculation (min) (ACUTE ONLY): 21 min  Charges:  $Gait Training: 8-22 mins                    G Codes:      Rica Koyanagi  PTA [facility]  Acute  Rehab Pager      [phone]
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
## 2016-01-10 ENCOUNTER — Other Ambulatory Visit: Payer: Self-pay

## 2016-01-10 MED ORDER — TRAMADOL HCL 50 MG PO TABS: 50.0000 mg | ORAL_TABLET | Freq: Four times a day (QID) | ORAL | Status: AC | PRN

## 2016-01-10 MED ORDER — PREDNISONE 50 MG PO TABS
60.0000 mg | ORAL_TABLET | Freq: Once | ORAL | Status: AC
  Administered 2016-01-10: 60 mg via ORAL
  Filled 2016-01-10: qty 1

## 2016-01-10 MED ORDER — BENZONATATE 200 MG PO CAPS: 200.0000 mg | ORAL_CAPSULE | Freq: Three times a day (TID) | ORAL | Status: DC | PRN

## 2016-01-10 MED ORDER — MENTHOL 3 MG MT LOZG: 1.0000 | LOZENGE | OROMUCOSAL | Status: DC | PRN

## 2016-01-10 MED ORDER — PREDNISONE 10 MG PO TABS: ORAL_TABLET | ORAL | Status: DC

## 2016-01-10 MED ORDER — PREDNISONE 20 MG PO TABS: 40.0000 mg | ORAL_TABLET | Freq: Every day | ORAL | Status: DC

## 2016-01-10 MED ORDER — GUAIFENESIN-CODEINE 100-10 MG/5ML PO SOLN: 5.0000 mL | Freq: Four times a day (QID) | ORAL | Status: AC | PRN

## 2016-01-10 NOTE — Progress Notes (Signed)
CM arranged with [facility] Pricilla [phone])  to provide portable tank for transportation to home. Whitman Hero RN,BSN,CM [phone]

## 2016-01-10 NOTE — Progress Notes (Signed)
Cristian Collins to be D/Collins'd Home per MD order.  Discussed with the patient and all questions fully answered.  VSS, Skin clean, dry and intact without evidence of skin break down, no evidence of skin tears noted. IV catheter discontinued intact. Site without signs and symptoms of complications. Dressing and pressure applied.  An After Visit Summary was printed and given to the patient. Patient received prescription.  D/Collins education completed with patient/family including follow up instructions, medication list, d/Collins activities limitations if indicated, with other d/Collins instructions as indicated by MD - patient able to verbalize understanding, all questions fully answered.   Patient instructed to return to ED, call 911, or call MD for any changes in condition.   Patient to be  escorted via [facility], and D/Collins home via private auto.   Patient waiting on [facility] to bring 02 tank to room for discharge home.   [name], Cristian Collins 01/10/2016 12:28 PM

## 2016-01-10 NOTE — Care Management Important Message (Signed)
Important Message  Patient Details  Name: Cristian Collins MRN: [id number] Date of Birth: [birth year]   Medicare Important Message Given:  Yes    [name] 01/10/2016, 10:04 AM

## 2016-01-10 NOTE — Discharge Summary (Signed)
Physician Discharge Summary   Patient ID: Cristian Collins MRN: [id number] DOB/AGE: 80-21-1931 80 y.o.  Admit date: 12/29/2015 Discharge date: 01/10/2016  Primary Care Physician:  [name], MD  Discharge Diagnoses:    . Acute on chronic respiratory failure with hypoxia and hypercapnia (HCC) . Pneumonia/right lower lung streptococcal pneumonia  . COPD exacerbation ([place]) . CAD (coronary artery disease) . Anemia, iron deficiency . Essential hypertension . Community acquired pneumonia . COPD GOLD III   Consults:  Pulmonology Palliative care  Recommendations for Outpatient Follow-up:  1. Home health PT OT RN, home health aid, respiratory care was arranged by the case management  2. Please repeat CBC/BMET at next visit 3. Patient has an appointment with pulmonology outpatient  DIET: Heart healthy diet   Allergies:  No Known Allergies   DISCHARGE MEDICATIONS: Current Discharge Medication List    START taking these medications   Details  benzonatate (TESSALON) 200 MG capsule Take 1 capsule (200 mg total) by mouth 3 (three) times daily as needed for cough. Qty: 90 capsule, Refills: 0    guaiFENesin-codeine 100-10 MG/5ML syrup Take 5 mLs by mouth every 6 (six) hours as needed for cough. Qty: 120 mL, Refills: 0    menthol-cetylpyridinium (CEPACOL) 3 MG lozenge Take 1 lozenge (3 mg total) by mouth as needed for sore throat. Qty: 100 tablet, Refills: 12    predniSONE (DELTASONE) 10 MG tablet Prednisone dosing: Take  Prednisone 40mg  (4 tabs) x 3 days, then taper to 30mg  (3 tabs) x 3 days, then 20mg  (2 tabs) x 3days, then 10mg  (1 tab) x 3days, then OFF.  Dispense:  30 tabs, refills: None Qty: 30 tablet, Refills: 0    traMADol (ULTRAM) 50 MG tablet Take 1 tablet (50 mg total) by mouth every 6 (six) hours as needed for moderate pain. Qty: 30 tablet, Refills: 0      CONTINUE these medications which have NOT CHANGED   Details  albuterol (PROAIR HFA) 108 (90 BASE)  MCG/ACT inhaler Inhale 2 puffs into the lungs 4 (four) times daily. For shortness of breath    albuterol (PROVENTIL) (2.5 MG/3ML) 0.083% nebulizer solution Take 3 mLs (2.5 mg total) by nebulization every 4 (four) hours as needed for shortness of breath. For shortness of breath Qty: 75 mL, Refills: 12    amLODipine (NORVASC) 10 MG tablet Take 10 mg by mouth daily.     aspirin EC 81 MG tablet Take 81 mg by mouth daily.    budesonide-formoterol (SYMBICORT) 160-4.5 MCG/ACT inhaler Inhale 2 puffs into the lungs 2 (two) times daily.    eplerenone (INSPRA) 25 MG tablet Take 25 mg by mouth daily. Reported on 08/12/2015 Refills: 3    feeding supplement, ENSURE COMPLETE, (ENSURE COMPLETE) LIQD Take 237 mLs by mouth 3 (three) times daily between meals. Qty: 30 Bottle, Refills: 2    furosemide (LASIX) 20 MG tablet Take 20 mg by mouth 2 (two) times daily.    ipratropium (ATROVENT) 0.02 % nebulizer solution Take 2.5 mLs (0.5 mg total) by nebulization every 6 (six) hours. Qty: 75 mL, Refills: 5    isosorbide mononitrate (IMDUR) 60 MG 24 hr tablet Take 60 mg by mouth daily.    pantoprazole (PROTONIX) 40 MG tablet Take 1 tablet (40 mg total) by mouth daily. Qty: 30 tablet, Refills: 0    simvastatin (ZOCOR) 20 MG tablet Take 20 mg by mouth daily.     spironolactone (ALDACTONE) 25 MG tablet Take 25 mg by mouth every other day. Refills: 3  tiotropium (SPIRIVA) 18 MCG inhalation capsule Place 18 mcg into inhaler and inhale daily.     nitroGLYCERIN (NITROSTAT) 0.4 MG SL tablet Place 0.4 mg under the tongue every 5 (five) minutes as needed for chest pain. For chest pain      STOP taking these medications     levofloxacin (LEVAQUIN) 500 MG tablet      predniSONE (STERAPRED UNI-PAK 21 TAB) 10 MG (21) TBPK tablet          Brief H and P: For complete details please refer to admission H and P, but in brief 82 year old male, married, lives with spouse and ambulates with the help of a  cane/walker, not on home oxygen, PMH of COPD, HTN, HLD, CAD, chronic chest pain, chronic diastolic CHF, GERD, former smoker, presented [facility] ED on 12/29/15 with progressively worsening cough and dyspnea of 4 days' duration. He gives chronic, several months history of left sided chest pain which seems atypical-his PCP/cardiologists are aware. Seen by his PCP for itchy rash which is now resolving and was referred to a dermatologist. Admitted for pneumonia, COPD exacerbation.  Hospital Course:  Community-acquired RLL streptococcal pneumonia -Patient has completed IV Rocephin and Zithromax.  -Urine strept antigen positive. Urine Legionella: Negative. - Recommend repeating chest x-ray in 3-4 weeks to ensure resolution of pneumonia findings. -Sputum culture: Candida  Severe COPD with exacerbation With chronic respiratory failure on home O2 2 L, end-stage - Patient was placed on Pulmicort, brovana,, scheduled nebs, flutter valve, IV Solu-Medrol.   - Coughing improved with  Tessalon Perles, Robitussin, Cepacol  - CXR 5/28 showed interval improvement of right lower lobe infiltrate  - CT maxillofacial showed no significant paranasal sinus mucosal thickening, paced patient of the left nasolacrimal duct - Pulmonology was consulted and despite maximal treatment patient had slow and poor improvement. Palliative care was consulted for goals of care. At this time, patient has very positive will to live and would like to continue aggressive treatment for COPD. - Home health PT OT, RN, respiratory care was arranged, he will benefit from hospice and outpatient palliative follow-up.  Atypical chest pain/chronic chest pain syndrome- currently stable - Apparently has had it for several months. He stated that his PCP and primary cardiologist (Dr. [name]) are aware. One of 3 troponins minimally elevated but the third and last 1 was negative. EKG without acute findings. - [name] discussed with patient's  primary cardiologist. Patient has chronic chest pain syndrome. Cardiac cath had shown patent RCA stent and no other abnormalities. - Continue aspirin, statins and nitrates.  Essential hypertension Controlled   Chronic diastolic CHF - Compensated. LVEF of January 2017: 65-70 percent.  - Continue home dose of Lasix & Aldactone.  Hyperlipidemia  - Statins  Chronic anemia - Stable.  GERD - PPI  Moderate protein calorie Malnutrition - Continue nutritional supplements  Day of Discharge BP 142/69 mmHg  Pulse 78  Temp(Src) 98.2 F (36.8 C) (Oral)  Resp 18  Ht 5\' 6"  (1.676 m)  Wt 48.2 kg (106 lb 4.2 oz)  BMI 17.16 kg/m2  SpO2 100%  Physical Exam: General: Alert and awake oriented x3 not in any acute distress. HEENT: anicteric sclera, pupils reactive to light and accommodation CVS: S1-S2 clear no murmur rubs or gallops Chest: Minimal wheezing today, improving  Abdomen: soft nontender, nondistended, normal bowel sounds Extremities: no cyanosis, clubbing or edema noted bilaterally Neuro: Cranial nerves II-XII intact, no focal neurological deficits   The results of significant diagnostics from this hospitalization (including  imaging, microbiology, ancillary and laboratory) are listed below for reference.    LAB RESULTS: Basic Metabolic Panel:  Recent Labs Lab 01/07/16 0930 01/08/16 0404  NA 130* 131*  K 4.7 4.7  CL 91* 93*  CO2 30 30  GLUCOSE 223* 153*  BUN 30* 31*  CREATININE 1.16 1.18  CALCIUM 8.6* 8.4*   Liver Function Tests: No results for input(s): AST, ALT, ALKPHOS, BILITOT, PROT, ALBUMIN in the last 168 hours. No results for input(s): LIPASE, AMYLASE in the last 168 hours. No results for input(s): AMMONIA in the last 168 hours. CBC:  Recent Labs Lab 01/07/16 0930 01/08/16 0404  WBC 8.8 8.8  HGB 10.0* 9.7*  HCT 30.7* 29.4*  MCV 83.9 84.5  PLT 333 308   Cardiac Enzymes: No results for input(s): CKTOTAL, CKMB, CKMBINDEX, TROPONINI in the last 168  hours. BNP: Invalid input(s): POCBNP CBG: No results for input(s): GLUCAP in the last 168 hours.  Significant Diagnostic Studies:  Dg Chest 2 View  12/29/2015  CLINICAL DATA:  Shortness of breath.  COPD exacerbation. EXAM: CHEST  2 VIEW COMPARISON:  08/12/2015 chest radiograph. FINDINGS: Stable cardiomediastinal silhouette with normal heart size. No pneumothorax. No pleural effusion. Emphysema and hyperinflated lungs. No pulmonary edema. Mild hazy opacity in the right lower lobe appears new. IMPRESSION: 1. Mild hazy right lower lobe opacity appears new and suggests right lower lobe pneumonia or aspiration. Recommend follow-up PA and lateral post treatment chest radiographs in 4-6 weeks. 2. Emphysema and hyperinflated lungs, consistent with the provided history of COPD. Electronically Signed   By: Ilona Sorrel M.D.   On: 12/29/2015 21:08    2D ECHO:   Disposition and Follow-up: Discharge Instructions    AMB Referral to [place] Management    Complete by:  As directed   Reason for consult:  COPD exacerbation  Diagnoses of:  COPD/ Pneumonia  Expected date of contact:  1-3 days (reserved for hospital discharges)  Please assign to community nurse for transition of care calls and assess for home visits.Home oxygen with [facility].  Questions please call:   Natividad Brood, RN BSN [facility] Liaison  [phone] business mobile phone Toll free office [phone]     Diet - low sodium heart healthy    Complete by:  As directed      Discharge instructions    Complete by:  As directed   Continue nebulizer breathing treatments 4 times a day until you see lung doctor appointment next week on Monday     Increase activity slowly    Complete by:  As directed             DISPOSITION:Home with home health   DISCHARGE FOLLOW-UP Follow-up Information    Follow up with [name], MD On 01/15/2016.   Specialty:  Pulmonary Disease   Why:  for hospital follow-up  at 4:00 PM with Carolynn Serve, NP   Contact information:   [address] [place] [address] [phone]       Follow up with [name], MD On 01/19/2016.   Specialty:  Internal Medicine   Why:  for hospital follow-up/// Appointment with Dr. Baird Cancer is on 01/19/16 at 11:30am   Contact information:   [address] [place] [address] [phone]        Time spent on Discharge: 35 minutes  Signed:   RAI,RIPUDEEP M.D. [facility] 01/10/2016, 12:42 PM Pager: [phone]

## 2016-01-10 NOTE — Progress Notes (Signed)
Dr. Tana Coast made aware loss of IV access and could not give IV steroid. MD stated okay to remain without IV access at this time and she will address medication.

## 2016-01-11 NOTE — Patient Outreach (Signed)
Chart review for discharge planning. Will contact patient with 24-72 hours of discharge for community care coordination.

## 2016-01-12 ENCOUNTER — Other Ambulatory Visit: Payer: Self-pay

## 2016-01-13 DIAGNOSIS — E785 Hyperlipidemia, unspecified: Secondary | ICD-10-CM | POA: Diagnosis not present

## 2016-01-13 DIAGNOSIS — Z9981 Dependence on supplemental oxygen: Secondary | ICD-10-CM | POA: Diagnosis not present

## 2016-01-13 DIAGNOSIS — I110 Hypertensive heart disease with heart failure: Secondary | ICD-10-CM | POA: Diagnosis not present

## 2016-01-13 DIAGNOSIS — Z7982 Long term (current) use of aspirin: Secondary | ICD-10-CM | POA: Diagnosis not present

## 2016-01-13 DIAGNOSIS — K219 Gastro-esophageal reflux disease without esophagitis: Secondary | ICD-10-CM | POA: Diagnosis not present

## 2016-01-13 DIAGNOSIS — Z7951 Long term (current) use of inhaled steroids: Secondary | ICD-10-CM | POA: Diagnosis not present

## 2016-01-13 DIAGNOSIS — I5032 Chronic diastolic (congestive) heart failure: Secondary | ICD-10-CM | POA: Diagnosis not present

## 2016-01-13 DIAGNOSIS — D509 Iron deficiency anemia, unspecified: Secondary | ICD-10-CM | POA: Diagnosis not present

## 2016-01-13 DIAGNOSIS — I2510 Atherosclerotic heart disease of native coronary artery without angina pectoris: Secondary | ICD-10-CM | POA: Diagnosis not present

## 2016-01-13 DIAGNOSIS — J440 Chronic obstructive pulmonary disease with acute lower respiratory infection: Secondary | ICD-10-CM | POA: Diagnosis not present

## 2016-01-13 DIAGNOSIS — J154 Pneumonia due to other streptococci: Secondary | ICD-10-CM | POA: Diagnosis not present

## 2016-01-13 DIAGNOSIS — J441 Chronic obstructive pulmonary disease with (acute) exacerbation: Secondary | ICD-10-CM | POA: Diagnosis not present

## 2016-01-14 DIAGNOSIS — J449 Chronic obstructive pulmonary disease, unspecified: Secondary | ICD-10-CM | POA: Diagnosis not present

## 2016-01-15 ENCOUNTER — Encounter: Payer: Self-pay | Admitting: Adult Health

## 2016-01-15 ENCOUNTER — Other Ambulatory Visit: Payer: Self-pay

## 2016-01-15 ENCOUNTER — Ambulatory Visit (INDEPENDENT_AMBULATORY_CARE_PROVIDER_SITE_OTHER): Payer: Medicare Other | Admitting: Adult Health

## 2016-01-15 VITALS — BP 124/60 | HR 77 | Temp 98.1°F | Ht 66.0 in | Wt 93.0 lb

## 2016-01-15 DIAGNOSIS — J189 Pneumonia, unspecified organism: Secondary | ICD-10-CM

## 2016-01-15 DIAGNOSIS — J441 Chronic obstructive pulmonary disease with (acute) exacerbation: Secondary | ICD-10-CM

## 2016-01-15 DIAGNOSIS — D509 Iron deficiency anemia, unspecified: Secondary | ICD-10-CM | POA: Diagnosis not present

## 2016-01-15 DIAGNOSIS — I110 Hypertensive heart disease with heart failure: Secondary | ICD-10-CM | POA: Diagnosis not present

## 2016-01-15 DIAGNOSIS — Z9981 Dependence on supplemental oxygen: Secondary | ICD-10-CM | POA: Diagnosis not present

## 2016-01-15 DIAGNOSIS — E785 Hyperlipidemia, unspecified: Secondary | ICD-10-CM | POA: Diagnosis not present

## 2016-01-15 DIAGNOSIS — I5032 Chronic diastolic (congestive) heart failure: Secondary | ICD-10-CM | POA: Diagnosis not present

## 2016-01-15 DIAGNOSIS — J154 Pneumonia due to other streptococci: Secondary | ICD-10-CM | POA: Diagnosis not present

## 2016-01-15 DIAGNOSIS — J9612 Chronic respiratory failure with hypercapnia: Secondary | ICD-10-CM

## 2016-01-15 DIAGNOSIS — J440 Chronic obstructive pulmonary disease with acute lower respiratory infection: Secondary | ICD-10-CM | POA: Diagnosis not present

## 2016-01-15 DIAGNOSIS — I2510 Atherosclerotic heart disease of native coronary artery without angina pectoris: Secondary | ICD-10-CM | POA: Diagnosis not present

## 2016-01-15 DIAGNOSIS — K219 Gastro-esophageal reflux disease without esophagitis: Secondary | ICD-10-CM | POA: Diagnosis not present

## 2016-01-15 DIAGNOSIS — Z7951 Long term (current) use of inhaled steroids: Secondary | ICD-10-CM | POA: Diagnosis not present

## 2016-01-15 DIAGNOSIS — Z7982 Long term (current) use of aspirin: Secondary | ICD-10-CM | POA: Diagnosis not present

## 2016-01-15 NOTE — Patient Instructions (Addendum)
Continue on current regimen  Check with your family doctor to see if you have gotten your pneumonia vaccines . Pneumovax and Prevnar 13.  Follow up with Dr. Melvyn Novas  In 4-6 weeks with chest xray  Please contact office for sooner follow up if symptoms do not improve or worsen or seek emergency care

## 2016-01-15 NOTE — Assessment & Plan Note (Signed)
Clinically improving  Check cxr on return  Continue on current regimen  Follow up with Dr. Melvyn Novas  In 4-6 weeks with chest xray  Please contact office for sooner follow up if symptoms do not improve or worsen or seek emergency care

## 2016-01-15 NOTE — Assessment & Plan Note (Signed)
Cont on O2 .  

## 2016-01-15 NOTE — Progress Notes (Signed)
Chart and office note reviewed in detail along > agree with a/p as outlined  

## 2016-01-15 NOTE — Patient Outreach (Signed)
   Call made to [facility] [place] Left message for Antonieta Iba to return my call. Mechele Claude is covering for Tammy who is on vacation.

## 2016-01-15 NOTE — Progress Notes (Signed)
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   rec Symbicort 160 Take 2 puffs first thing in am and then another 2 puffs about 12 hours later.  Stop advair Stay on spiriva Work on inhaler technique: . Please schedule a follow up office visit in 4 weeks, sooner if needed with pft's     01/20/2014 f/u ov/Cristian Collins re: Collins III COPD/ now 02 dep 2lpm 24/7  Chief Complaint  Patient presents with  . Follow-up    Pt states that his breathing continues to improve. He has minimal cough with clear sputum that he relates to PND. He c/o green nasal d/c for the past several days.   symbicort/spiriva each am/ very confused with instructions/ names of meds / now has neb also and now on 02  rec Plan A = symbicort 160 Take 2 puffs first thing in am and then another 2 puffs about 12 hours later.                 spiriva also each am Plan B= backup = proair up to 2 puff every 4 hours Plan C = Nebulizer use this up to every 4 hours if Plan B doesn't work Plan D = Doctor,  Call if needing more nebulizer treatments than usual  Work on inhaler technique     Admit date: 10/07/2014 Discharge date: 10/14/2014    Recommendations for Outpatient Follow-up:  1. Follow-up with primary care physician in one week. 2. Follow-up with Dr. Melvyn Collins on 10/24/2014 at 9 AM  Discharge Diagnoses:  Active Problems:  COPD exacerbation  Protein-calorie  malnutrition, severe  Essential hypertension  CAD (coronary artery disease)  AKI (acute kidney injury)  Normocytic anemia  Anemia, iron deficiency  Acute respiratory failure with hypoxia   Discharge Condition: Stable  Diet recommendation: Heart healthy  Filed Weights   10/12/14 0500 10/13/14 0500 10/14/14 0500  Weight: 45.9 kg (101 lb 3.1 oz) 45.314 kg (99 lb 14.4 oz) 44.906 kg (99 lb)    History of present illness:  82 year old male who has a past medical history of COPD presents to the hospital with chief complaint of shortness of breath which started 1 day PTA, patient has chronic home O2 with history of COPD and CHF. He also complains of coughing up phlegm, no fever or chills. Denies any chest pain. Patient is followed by pulmonary and cardiology as outpatient. He denies nausea vomiting or diarrhea. Admits to shortness of breath on exertion.  Hospital Course:   COPD acute exacerbation - Presented with SOB, wheezing and sputum production. - Started on bronchodilators, mucolytics, antitussives and oxygen as needed. - IV antibiotics and IV steroids. - Patient developed more shortness of breath while he was in the hospital, treated with IV Lasix - Patient received a total of 7 days of IV levofloxacin. - On discharge discharged on prednisone taper and Mucinex,  outpatient follow-up with Dr. Melvyn Collins in 10 days. - Pulmonology recommended prolonged prednisone taper and keep patient on 20 mg until he sees Dr. Melvyn Collins.  Musculoskeletal pain - Continue low dose morphine prn  Iron deficiency anemia, had some dark streaks in stools last week. Hemoglobin stable around 8.5mg /dl - Continue increased iron supplementation - Occult negative - Continue iron supplementation.  Acute kidney injury due to dehydration, creatinine trended down to 1.2 with IVF, resolved. This is resolved, patient restarted on his home dose of Demadex and the time of discharge.  Hx of a-fib s/p  ablation and pacemaker placement - Tele: NSR, tele now off  Hypertension Continue amlodipine 10 mg daily  CAD - Continue statin, metoprolol, isosorbide, ASA  Hyperlipidemia Continue Zocor  Severe protein calorie malnutrition - Nutrition consult - Supplements - Liberalize diet  Leukocytosis, likely steroid-induced  Chronic respiratory failure -Patient is on 2 L of oxygen at home, on discharge he only needed 2 L of oxygen.       10/24/2014 post hosp ov/Cristian Collins re: Collins III copd / symbicort 160 2bid / spiriva/ 02 2lpm  Chief Complaint  Patient presents with  . Hospitalization Follow-up    Pt was admitted to hosp 10/07/14 and discharged 10/14/14. Pt has cough, sob at times but its getting better. Pt is on 2L pulse oxygen.  sputum is white now saba hfa 4x total since discharge  >>no changes    01/15/2016 Cristian Collins Hospital follow up : COPD Collins III /O2 dependent Patient presents for a post hospital follow-up. Patient was recently admitted for a COPD exacerbation, right lower lobe streptococcal pneumonia and acute on chronic hypoxic and hypercarbic respiratory failure. Patient was treated with IV antibiotics, steroids and nebulized bronchodilators. Discharged on Levaquin and a prednisone taper.has few days left on rx.  Since discharge. Patient is feeling improved with decreased shortness of breath and cough. She denies any chest pain, orthopnea, PND, leg swelling, or fever. Weight has trended down. Patient is currently at 93 pounds. Says appetite is starting to improve.  We discussed drinking ensure. , daughter is getting him some at home. Likes vanilla flavor.  Says he is having a very good day.  Remains on Symbiocrt and Spiriva .  Remains on O2 2l/m .    Current Medications, Allergies, Complete Past Medical History, Past Surgical History, Family History, and Social History were reviewed in [employer] record.  ROS  The following are not active complaints  unless bolded sore throat, dysphagia, dental problems, itching, sneezing,  nasal congestion or excess/ purulent secretions, ear ache,   fever, chills, sweats, unintended wt loss, pleuritic or exertional cp, hemoptysis,  orthopnea pnd or leg swelling, presyncope, palpitations, heartburn, abdominal pain, anorexia, nausea, vomiting, diarrhea  or change in bowel or urinary habits, change in stools or urine, dysuria,hematuria,  rash, arthralgias, visual complaints, headache, numbness weakness or ataxia or problems with walking or coordination,  change in mood/affect or memory.                    Objective:   Physical Exam     thin bm nad walking with cane mildly   Hoarse  05/11/2014       105  > 09/13/2014  103 >  10/24/2014 99 Filed Vitals:   01/15/16 1606  BP: 124/60  Pulse: 77  Temp: 98.1 F (36.7 C)  TempSrc: Oral  Height: 5\' 6"  (1.676 m)  Weight: 93 lb (42.185 kg)  SpO2: 100%  HEENT mild turbinate edema.  Edentulous with dentures in place.Oropharynx no thrush or excess pnd or cobblestoning.  No JVD or cervical adenopathy. Mild accessory muscle hypertrophy. Trachea midline, nl thryroid. Chest was hyperinflated by percussion with diminished breath sounds and moderate increased exp time without wheeze. Hoover sign positive at mid inspiration. Regular rate and rhythm without murmur gallop or rub or increase P2 or edema.  Abd: no hsm, nl excursion. Ext warm without cyanosis or clubbing.         Kennadee Walthour NP-C  [facility]  01/15/2016

## 2016-01-15 NOTE — Assessment & Plan Note (Signed)
Recent exacerbation now improving   Plan  Continue on current regimen  Follow up with Dr. Melvyn Novas  In 4-6 weeks with chest xray  Please contact office for sooner follow up if symptoms do not improve or worsen or seek emergency care

## 2016-01-16 ENCOUNTER — Other Ambulatory Visit: Payer: Self-pay

## 2016-01-16 DIAGNOSIS — J441 Chronic obstructive pulmonary disease with (acute) exacerbation: Secondary | ICD-10-CM | POA: Diagnosis not present

## 2016-01-16 DIAGNOSIS — Z7982 Long term (current) use of aspirin: Secondary | ICD-10-CM | POA: Diagnosis not present

## 2016-01-16 DIAGNOSIS — I110 Hypertensive heart disease with heart failure: Secondary | ICD-10-CM | POA: Diagnosis not present

## 2016-01-16 DIAGNOSIS — I5032 Chronic diastolic (congestive) heart failure: Secondary | ICD-10-CM | POA: Diagnosis not present

## 2016-01-16 DIAGNOSIS — I2510 Atherosclerotic heart disease of native coronary artery without angina pectoris: Secondary | ICD-10-CM | POA: Diagnosis not present

## 2016-01-16 DIAGNOSIS — Z9981 Dependence on supplemental oxygen: Secondary | ICD-10-CM | POA: Diagnosis not present

## 2016-01-16 DIAGNOSIS — J440 Chronic obstructive pulmonary disease with acute lower respiratory infection: Secondary | ICD-10-CM | POA: Diagnosis not present

## 2016-01-16 DIAGNOSIS — E785 Hyperlipidemia, unspecified: Secondary | ICD-10-CM | POA: Diagnosis not present

## 2016-01-16 DIAGNOSIS — D509 Iron deficiency anemia, unspecified: Secondary | ICD-10-CM | POA: Diagnosis not present

## 2016-01-16 DIAGNOSIS — Z7951 Long term (current) use of inhaled steroids: Secondary | ICD-10-CM | POA: Diagnosis not present

## 2016-01-16 DIAGNOSIS — K219 Gastro-esophageal reflux disease without esophagitis: Secondary | ICD-10-CM | POA: Diagnosis not present

## 2016-01-16 DIAGNOSIS — J154 Pneumonia due to other streptococci: Secondary | ICD-10-CM | POA: Diagnosis not present

## 2016-01-16 NOTE — Patient Outreach (Signed)
Coordination call to [facility] to clarify if patient has been referred to agency.   This RNCM was advised Tammy was on vacation and covering RN, Nurse Nicki Reaper was not available. Message left for Nurse Scott with this RNCM's contact information.  Plan: Home visit tomorrow for initial home visit.

## 2016-01-17 DIAGNOSIS — J440 Chronic obstructive pulmonary disease with acute lower respiratory infection: Secondary | ICD-10-CM | POA: Diagnosis not present

## 2016-01-17 DIAGNOSIS — E785 Hyperlipidemia, unspecified: Secondary | ICD-10-CM | POA: Diagnosis not present

## 2016-01-17 DIAGNOSIS — K219 Gastro-esophageal reflux disease without esophagitis: Secondary | ICD-10-CM | POA: Diagnosis not present

## 2016-01-17 DIAGNOSIS — I2510 Atherosclerotic heart disease of native coronary artery without angina pectoris: Secondary | ICD-10-CM | POA: Diagnosis not present

## 2016-01-17 DIAGNOSIS — J154 Pneumonia due to other streptococci: Secondary | ICD-10-CM | POA: Diagnosis not present

## 2016-01-17 DIAGNOSIS — Z7982 Long term (current) use of aspirin: Secondary | ICD-10-CM | POA: Diagnosis not present

## 2016-01-17 DIAGNOSIS — I110 Hypertensive heart disease with heart failure: Secondary | ICD-10-CM | POA: Diagnosis not present

## 2016-01-17 DIAGNOSIS — Z9981 Dependence on supplemental oxygen: Secondary | ICD-10-CM | POA: Diagnosis not present

## 2016-01-17 DIAGNOSIS — I5032 Chronic diastolic (congestive) heart failure: Secondary | ICD-10-CM | POA: Diagnosis not present

## 2016-01-17 DIAGNOSIS — J441 Chronic obstructive pulmonary disease with (acute) exacerbation: Secondary | ICD-10-CM | POA: Diagnosis not present

## 2016-01-17 DIAGNOSIS — D509 Iron deficiency anemia, unspecified: Secondary | ICD-10-CM | POA: Diagnosis not present

## 2016-01-17 DIAGNOSIS — Z7951 Long term (current) use of inhaled steroids: Secondary | ICD-10-CM | POA: Diagnosis not present

## 2016-01-17 NOTE — Patient Outreach (Signed)
[place] [facility]) Care Management  01/17/2016  NAREK KNISS 04/16/1930 [id number]   Initial home visit for assessment of community care coordination. This RNCM met with patient and his wife. Patient states he was recently admitted to the acute care setting because he was having problems breathing. Patient lives in a single family home with his wife and several other family members.  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  During this visit, the following were found: Patient reports being offered the option of Hopsice at discharge, however, he feels he is no ready for Hospice yet..  Patient states "I want everything done if I get real sick." Patient has very good family support, his wife is his primary caregiver. Wife also provides transportation to medical appointment.  Patient and wife own a car.   Patient is on continuous oxygen, receives his oxygen supplies from [facility].  Patient is also receiving HHRN/PT/OT from [place]. Patient reports an improved appetite, was eating duirng this home visit-toast, soup and a beverage.  Patent, wife and this RNCM collaborated to form to [facility]. Due to patient's identification of easily being tired as a barrier, this RNCM encouraged responses from wife and kept this initial home visit to a minimal.  Plan: Telephone contact next week for community care coordination.Marland Kitchen

## 2016-01-19 DIAGNOSIS — D509 Iron deficiency anemia, unspecified: Secondary | ICD-10-CM | POA: Diagnosis not present

## 2016-01-19 DIAGNOSIS — Z09 Encounter for follow-up examination after completed treatment for conditions other than malignant neoplasm: Secondary | ICD-10-CM | POA: Diagnosis not present

## 2016-01-19 DIAGNOSIS — J441 Chronic obstructive pulmonary disease with (acute) exacerbation: Secondary | ICD-10-CM | POA: Diagnosis not present

## 2016-01-19 DIAGNOSIS — Z7951 Long term (current) use of inhaled steroids: Secondary | ICD-10-CM | POA: Diagnosis not present

## 2016-01-19 DIAGNOSIS — J449 Chronic obstructive pulmonary disease, unspecified: Secondary | ICD-10-CM | POA: Diagnosis not present

## 2016-01-19 DIAGNOSIS — I110 Hypertensive heart disease with heart failure: Secondary | ICD-10-CM | POA: Diagnosis not present

## 2016-01-19 DIAGNOSIS — J13 Pneumonia due to Streptococcus pneumoniae: Secondary | ICD-10-CM | POA: Diagnosis not present

## 2016-01-19 DIAGNOSIS — I5032 Chronic diastolic (congestive) heart failure: Secondary | ICD-10-CM | POA: Diagnosis not present

## 2016-01-19 DIAGNOSIS — K219 Gastro-esophageal reflux disease without esophagitis: Secondary | ICD-10-CM | POA: Diagnosis not present

## 2016-01-19 DIAGNOSIS — Z7982 Long term (current) use of aspirin: Secondary | ICD-10-CM | POA: Diagnosis not present

## 2016-01-19 DIAGNOSIS — J440 Chronic obstructive pulmonary disease with acute lower respiratory infection: Secondary | ICD-10-CM | POA: Diagnosis not present

## 2016-01-19 DIAGNOSIS — J154 Pneumonia due to other streptococci: Secondary | ICD-10-CM | POA: Diagnosis not present

## 2016-01-19 DIAGNOSIS — I2510 Atherosclerotic heart disease of native coronary artery without angina pectoris: Secondary | ICD-10-CM | POA: Diagnosis not present

## 2016-01-19 DIAGNOSIS — E785 Hyperlipidemia, unspecified: Secondary | ICD-10-CM | POA: Diagnosis not present

## 2016-01-19 DIAGNOSIS — Z9981 Dependence on supplemental oxygen: Secondary | ICD-10-CM | POA: Diagnosis not present

## 2016-01-23 ENCOUNTER — Other Ambulatory Visit: Payer: Self-pay

## 2016-01-23 DIAGNOSIS — E785 Hyperlipidemia, unspecified: Secondary | ICD-10-CM | POA: Diagnosis not present

## 2016-01-23 DIAGNOSIS — K219 Gastro-esophageal reflux disease without esophagitis: Secondary | ICD-10-CM | POA: Diagnosis not present

## 2016-01-23 DIAGNOSIS — Z9981 Dependence on supplemental oxygen: Secondary | ICD-10-CM | POA: Diagnosis not present

## 2016-01-23 DIAGNOSIS — I2510 Atherosclerotic heart disease of native coronary artery without angina pectoris: Secondary | ICD-10-CM | POA: Diagnosis not present

## 2016-01-23 DIAGNOSIS — J441 Chronic obstructive pulmonary disease with (acute) exacerbation: Secondary | ICD-10-CM | POA: Diagnosis not present

## 2016-01-23 DIAGNOSIS — J154 Pneumonia due to other streptococci: Secondary | ICD-10-CM | POA: Diagnosis not present

## 2016-01-23 DIAGNOSIS — Z7982 Long term (current) use of aspirin: Secondary | ICD-10-CM | POA: Diagnosis not present

## 2016-01-23 DIAGNOSIS — D509 Iron deficiency anemia, unspecified: Secondary | ICD-10-CM | POA: Diagnosis not present

## 2016-01-23 DIAGNOSIS — Z7951 Long term (current) use of inhaled steroids: Secondary | ICD-10-CM | POA: Diagnosis not present

## 2016-01-23 DIAGNOSIS — J440 Chronic obstructive pulmonary disease with acute lower respiratory infection: Secondary | ICD-10-CM | POA: Diagnosis not present

## 2016-01-23 DIAGNOSIS — I110 Hypertensive heart disease with heart failure: Secondary | ICD-10-CM | POA: Diagnosis not present

## 2016-01-23 DIAGNOSIS — I5032 Chronic diastolic (congestive) heart failure: Secondary | ICD-10-CM | POA: Diagnosis not present

## 2016-01-24 NOTE — Patient Outreach (Signed)
Unsuccessful attempt made to contact patient via  Telephone for community care coordiantion. Will make another attempt on Friday, June 16.

## 2016-01-25 DIAGNOSIS — J154 Pneumonia due to other streptococci: Secondary | ICD-10-CM | POA: Diagnosis not present

## 2016-01-25 DIAGNOSIS — I2510 Atherosclerotic heart disease of native coronary artery without angina pectoris: Secondary | ICD-10-CM | POA: Diagnosis not present

## 2016-01-25 DIAGNOSIS — J441 Chronic obstructive pulmonary disease with (acute) exacerbation: Secondary | ICD-10-CM | POA: Diagnosis not present

## 2016-01-25 DIAGNOSIS — J440 Chronic obstructive pulmonary disease with acute lower respiratory infection: Secondary | ICD-10-CM | POA: Diagnosis not present

## 2016-01-25 DIAGNOSIS — I110 Hypertensive heart disease with heart failure: Secondary | ICD-10-CM | POA: Diagnosis not present

## 2016-01-25 DIAGNOSIS — I5032 Chronic diastolic (congestive) heart failure: Secondary | ICD-10-CM | POA: Diagnosis not present

## 2016-01-25 DIAGNOSIS — E785 Hyperlipidemia, unspecified: Secondary | ICD-10-CM | POA: Diagnosis not present

## 2016-01-25 DIAGNOSIS — K219 Gastro-esophageal reflux disease without esophagitis: Secondary | ICD-10-CM | POA: Diagnosis not present

## 2016-01-25 DIAGNOSIS — D509 Iron deficiency anemia, unspecified: Secondary | ICD-10-CM | POA: Diagnosis not present

## 2016-01-25 DIAGNOSIS — Z7982 Long term (current) use of aspirin: Secondary | ICD-10-CM | POA: Diagnosis not present

## 2016-01-25 DIAGNOSIS — Z7951 Long term (current) use of inhaled steroids: Secondary | ICD-10-CM | POA: Diagnosis not present

## 2016-01-25 DIAGNOSIS — Z9981 Dependence on supplemental oxygen: Secondary | ICD-10-CM | POA: Diagnosis not present

## 2016-01-26 ENCOUNTER — Other Ambulatory Visit: Payer: Self-pay

## 2016-01-26 DIAGNOSIS — Z7951 Long term (current) use of inhaled steroids: Secondary | ICD-10-CM | POA: Diagnosis not present

## 2016-01-26 DIAGNOSIS — E785 Hyperlipidemia, unspecified: Secondary | ICD-10-CM | POA: Diagnosis not present

## 2016-01-26 DIAGNOSIS — D509 Iron deficiency anemia, unspecified: Secondary | ICD-10-CM | POA: Diagnosis not present

## 2016-01-26 DIAGNOSIS — I5032 Chronic diastolic (congestive) heart failure: Secondary | ICD-10-CM | POA: Diagnosis not present

## 2016-01-26 DIAGNOSIS — J154 Pneumonia due to other streptococci: Secondary | ICD-10-CM | POA: Diagnosis not present

## 2016-01-26 DIAGNOSIS — I2510 Atherosclerotic heart disease of native coronary artery without angina pectoris: Secondary | ICD-10-CM | POA: Diagnosis not present

## 2016-01-26 DIAGNOSIS — K219 Gastro-esophageal reflux disease without esophagitis: Secondary | ICD-10-CM | POA: Diagnosis not present

## 2016-01-26 DIAGNOSIS — Z9981 Dependence on supplemental oxygen: Secondary | ICD-10-CM | POA: Diagnosis not present

## 2016-01-26 DIAGNOSIS — J440 Chronic obstructive pulmonary disease with acute lower respiratory infection: Secondary | ICD-10-CM | POA: Diagnosis not present

## 2016-01-26 DIAGNOSIS — J441 Chronic obstructive pulmonary disease with (acute) exacerbation: Secondary | ICD-10-CM | POA: Diagnosis not present

## 2016-01-26 DIAGNOSIS — Z7982 Long term (current) use of aspirin: Secondary | ICD-10-CM | POA: Diagnosis not present

## 2016-01-26 DIAGNOSIS — I110 Hypertensive heart disease with heart failure: Secondary | ICD-10-CM | POA: Diagnosis not present

## 2016-01-26 NOTE — Patient Outreach (Signed)
Telephone contact made with patient and wife who reports increased in his appetite adn complying with home health therapies after pateitn identified himself by proviiding date of birth and address.    Patinet and RNCM reviewed his care plan, updated.  Telephone contact next week for chronic disease education.

## 2016-01-29 DIAGNOSIS — J449 Chronic obstructive pulmonary disease, unspecified: Secondary | ICD-10-CM | POA: Diagnosis not present

## 2016-01-30 ENCOUNTER — Other Ambulatory Visit: Payer: Self-pay

## 2016-01-30 DIAGNOSIS — I5032 Chronic diastolic (congestive) heart failure: Secondary | ICD-10-CM | POA: Diagnosis not present

## 2016-01-30 DIAGNOSIS — J441 Chronic obstructive pulmonary disease with (acute) exacerbation: Secondary | ICD-10-CM | POA: Diagnosis not present

## 2016-01-30 DIAGNOSIS — Z7951 Long term (current) use of inhaled steroids: Secondary | ICD-10-CM | POA: Diagnosis not present

## 2016-01-30 DIAGNOSIS — Z7982 Long term (current) use of aspirin: Secondary | ICD-10-CM | POA: Diagnosis not present

## 2016-01-30 DIAGNOSIS — I110 Hypertensive heart disease with heart failure: Secondary | ICD-10-CM | POA: Diagnosis not present

## 2016-01-30 DIAGNOSIS — J154 Pneumonia due to other streptococci: Secondary | ICD-10-CM | POA: Diagnosis not present

## 2016-01-30 DIAGNOSIS — J440 Chronic obstructive pulmonary disease with acute lower respiratory infection: Secondary | ICD-10-CM | POA: Diagnosis not present

## 2016-01-30 DIAGNOSIS — I2510 Atherosclerotic heart disease of native coronary artery without angina pectoris: Secondary | ICD-10-CM | POA: Diagnosis not present

## 2016-01-30 DIAGNOSIS — K219 Gastro-esophageal reflux disease without esophagitis: Secondary | ICD-10-CM | POA: Diagnosis not present

## 2016-01-30 DIAGNOSIS — Z9981 Dependence on supplemental oxygen: Secondary | ICD-10-CM | POA: Diagnosis not present

## 2016-01-30 DIAGNOSIS — D509 Iron deficiency anemia, unspecified: Secondary | ICD-10-CM | POA: Diagnosis not present

## 2016-01-30 DIAGNOSIS — E785 Hyperlipidemia, unspecified: Secondary | ICD-10-CM | POA: Diagnosis not present

## 2016-01-31 NOTE — Patient Outreach (Signed)
[place] [facility]) Care Management  01/31/2016  TERIUS ZASADA [birth year] [id number]   Home visit to assess needs for community care coordination. patient lives in her home with her husband and adult children. Patient states he is not ready to call it quits yet, he states he continues to eat and do what he can.  patient and family appear very supportive with his wife being his primary caregiver. patient continues on oxygen via nasal cannula.  RNCM and patient review his case management care plan, updated goals.  Plan: Telephone contact next week for assessment of chronic disease management.

## 2016-02-01 DIAGNOSIS — Z7982 Long term (current) use of aspirin: Secondary | ICD-10-CM | POA: Diagnosis not present

## 2016-02-01 DIAGNOSIS — E785 Hyperlipidemia, unspecified: Secondary | ICD-10-CM | POA: Diagnosis not present

## 2016-02-01 DIAGNOSIS — I2510 Atherosclerotic heart disease of native coronary artery without angina pectoris: Secondary | ICD-10-CM | POA: Diagnosis not present

## 2016-02-01 DIAGNOSIS — J441 Chronic obstructive pulmonary disease with (acute) exacerbation: Secondary | ICD-10-CM | POA: Diagnosis not present

## 2016-02-01 DIAGNOSIS — J154 Pneumonia due to other streptococci: Secondary | ICD-10-CM | POA: Diagnosis not present

## 2016-02-01 DIAGNOSIS — I5032 Chronic diastolic (congestive) heart failure: Secondary | ICD-10-CM | POA: Diagnosis not present

## 2016-02-01 DIAGNOSIS — K219 Gastro-esophageal reflux disease without esophagitis: Secondary | ICD-10-CM | POA: Diagnosis not present

## 2016-02-01 DIAGNOSIS — Z7951 Long term (current) use of inhaled steroids: Secondary | ICD-10-CM | POA: Diagnosis not present

## 2016-02-01 DIAGNOSIS — D509 Iron deficiency anemia, unspecified: Secondary | ICD-10-CM | POA: Diagnosis not present

## 2016-02-01 DIAGNOSIS — Z9981 Dependence on supplemental oxygen: Secondary | ICD-10-CM | POA: Diagnosis not present

## 2016-02-01 DIAGNOSIS — J440 Chronic obstructive pulmonary disease with acute lower respiratory infection: Secondary | ICD-10-CM | POA: Diagnosis not present

## 2016-02-01 DIAGNOSIS — I110 Hypertensive heart disease with heart failure: Secondary | ICD-10-CM | POA: Diagnosis not present

## 2016-02-02 ENCOUNTER — Other Ambulatory Visit: Payer: Self-pay | Admitting: *Deleted

## 2016-02-02 DIAGNOSIS — Z7951 Long term (current) use of inhaled steroids: Secondary | ICD-10-CM | POA: Diagnosis not present

## 2016-02-02 DIAGNOSIS — E785 Hyperlipidemia, unspecified: Secondary | ICD-10-CM | POA: Diagnosis not present

## 2016-02-02 DIAGNOSIS — K219 Gastro-esophageal reflux disease without esophagitis: Secondary | ICD-10-CM | POA: Diagnosis not present

## 2016-02-02 DIAGNOSIS — J440 Chronic obstructive pulmonary disease with acute lower respiratory infection: Secondary | ICD-10-CM | POA: Diagnosis not present

## 2016-02-02 DIAGNOSIS — Z7982 Long term (current) use of aspirin: Secondary | ICD-10-CM | POA: Diagnosis not present

## 2016-02-02 DIAGNOSIS — Z9981 Dependence on supplemental oxygen: Secondary | ICD-10-CM | POA: Diagnosis not present

## 2016-02-02 DIAGNOSIS — I110 Hypertensive heart disease with heart failure: Secondary | ICD-10-CM | POA: Diagnosis not present

## 2016-02-02 DIAGNOSIS — I5032 Chronic diastolic (congestive) heart failure: Secondary | ICD-10-CM | POA: Diagnosis not present

## 2016-02-02 DIAGNOSIS — J154 Pneumonia due to other streptococci: Secondary | ICD-10-CM | POA: Diagnosis not present

## 2016-02-02 DIAGNOSIS — D509 Iron deficiency anemia, unspecified: Secondary | ICD-10-CM | POA: Diagnosis not present

## 2016-02-02 DIAGNOSIS — I2510 Atherosclerotic heart disease of native coronary artery without angina pectoris: Secondary | ICD-10-CM | POA: Diagnosis not present

## 2016-02-02 DIAGNOSIS — J441 Chronic obstructive pulmonary disease with (acute) exacerbation: Secondary | ICD-10-CM | POA: Diagnosis not present

## 2016-02-02 NOTE — Patient Outreach (Signed)
Conference telephone call with [facility] team revealed that Cristian Collins is now enrolled with the [facility] palliative home based program. Notification sent to [place] to make aware that [facility] services no longer needed at this time as patient is now enrolled with [facility].   Marthenia Rolling, MSN-Ed, RN,BSN [facility] Liaison [phone]

## 2016-02-06 ENCOUNTER — Other Ambulatory Visit: Payer: Self-pay

## 2016-02-06 ENCOUNTER — Ambulatory Visit: Payer: Self-pay

## 2016-02-06 DIAGNOSIS — J441 Chronic obstructive pulmonary disease with (acute) exacerbation: Secondary | ICD-10-CM | POA: Diagnosis not present

## 2016-02-06 DIAGNOSIS — D509 Iron deficiency anemia, unspecified: Secondary | ICD-10-CM | POA: Diagnosis not present

## 2016-02-06 DIAGNOSIS — Z9981 Dependence on supplemental oxygen: Secondary | ICD-10-CM | POA: Diagnosis not present

## 2016-02-06 DIAGNOSIS — J440 Chronic obstructive pulmonary disease with acute lower respiratory infection: Secondary | ICD-10-CM | POA: Diagnosis not present

## 2016-02-06 DIAGNOSIS — Z7951 Long term (current) use of inhaled steroids: Secondary | ICD-10-CM | POA: Diagnosis not present

## 2016-02-06 DIAGNOSIS — E785 Hyperlipidemia, unspecified: Secondary | ICD-10-CM | POA: Diagnosis not present

## 2016-02-06 DIAGNOSIS — J154 Pneumonia due to other streptococci: Secondary | ICD-10-CM | POA: Diagnosis not present

## 2016-02-06 DIAGNOSIS — J449 Chronic obstructive pulmonary disease, unspecified: Secondary | ICD-10-CM | POA: Diagnosis not present

## 2016-02-06 DIAGNOSIS — K219 Gastro-esophageal reflux disease without esophagitis: Secondary | ICD-10-CM | POA: Diagnosis not present

## 2016-02-06 DIAGNOSIS — I110 Hypertensive heart disease with heart failure: Secondary | ICD-10-CM | POA: Diagnosis not present

## 2016-02-06 DIAGNOSIS — I2510 Atherosclerotic heart disease of native coronary artery without angina pectoris: Secondary | ICD-10-CM | POA: Diagnosis not present

## 2016-02-06 DIAGNOSIS — I5032 Chronic diastolic (congestive) heart failure: Secondary | ICD-10-CM | POA: Diagnosis not present

## 2016-02-06 DIAGNOSIS — Z7982 Long term (current) use of aspirin: Secondary | ICD-10-CM | POA: Diagnosis not present

## 2016-02-06 NOTE — Patient Outreach (Signed)
Telephone contact made with patient's wife for care coordination, assess progression towards meeting care management goals.  Patient's wife, Oleta Mouse, states patient has been going good, has pain every once in a while and is controlled with current medication regimen.   Oleta Mouse stated patient is not having any problems breathing, continues to wear his oxygen as directed. Oleta Mouse acknowledge being contacted by [facility] for continued chronic disease support.  Plan: Discharge call next week.

## 2016-02-08 DIAGNOSIS — Z9981 Dependence on supplemental oxygen: Secondary | ICD-10-CM | POA: Diagnosis not present

## 2016-02-08 DIAGNOSIS — I5032 Chronic diastolic (congestive) heart failure: Secondary | ICD-10-CM | POA: Diagnosis not present

## 2016-02-08 DIAGNOSIS — I110 Hypertensive heart disease with heart failure: Secondary | ICD-10-CM | POA: Diagnosis not present

## 2016-02-08 DIAGNOSIS — K219 Gastro-esophageal reflux disease without esophagitis: Secondary | ICD-10-CM | POA: Diagnosis not present

## 2016-02-08 DIAGNOSIS — E785 Hyperlipidemia, unspecified: Secondary | ICD-10-CM | POA: Diagnosis not present

## 2016-02-08 DIAGNOSIS — I2510 Atherosclerotic heart disease of native coronary artery without angina pectoris: Secondary | ICD-10-CM | POA: Diagnosis not present

## 2016-02-08 DIAGNOSIS — J440 Chronic obstructive pulmonary disease with acute lower respiratory infection: Secondary | ICD-10-CM | POA: Diagnosis not present

## 2016-02-08 DIAGNOSIS — Z7951 Long term (current) use of inhaled steroids: Secondary | ICD-10-CM | POA: Diagnosis not present

## 2016-02-08 DIAGNOSIS — J154 Pneumonia due to other streptococci: Secondary | ICD-10-CM | POA: Diagnosis not present

## 2016-02-08 DIAGNOSIS — D509 Iron deficiency anemia, unspecified: Secondary | ICD-10-CM | POA: Diagnosis not present

## 2016-02-08 DIAGNOSIS — Z7982 Long term (current) use of aspirin: Secondary | ICD-10-CM | POA: Diagnosis not present

## 2016-02-08 DIAGNOSIS — J441 Chronic obstructive pulmonary disease with (acute) exacerbation: Secondary | ICD-10-CM | POA: Diagnosis not present

## 2016-02-10 ENCOUNTER — Emergency Department (HOSPITAL_COMMUNITY): Payer: Medicare Other

## 2016-02-10 ENCOUNTER — Inpatient Hospital Stay (HOSPITAL_COMMUNITY)
Admission: EM | Admit: 2016-02-10 | Discharge: 2016-02-16 | DRG: 190 | Disposition: A | Discharge status: Home Health | Payer: Medicare Other | Attending: Internal Medicine | Admitting: Internal Medicine

## 2016-02-10 ENCOUNTER — Encounter (HOSPITAL_COMMUNITY): Payer: Self-pay | Admitting: *Deleted

## 2016-02-10 ENCOUNTER — Other Ambulatory Visit: Payer: Self-pay

## 2016-02-10 DIAGNOSIS — R0602 Shortness of breath: Secondary | ICD-10-CM | POA: Diagnosis not present

## 2016-02-10 DIAGNOSIS — E785 Hyperlipidemia, unspecified: Secondary | ICD-10-CM | POA: Diagnosis not present

## 2016-02-10 DIAGNOSIS — Z7982 Long term (current) use of aspirin: Secondary | ICD-10-CM | POA: Diagnosis not present

## 2016-02-10 DIAGNOSIS — Z9981 Dependence on supplemental oxygen: Secondary | ICD-10-CM | POA: Diagnosis not present

## 2016-02-10 DIAGNOSIS — R627 Adult failure to thrive: Secondary | ICD-10-CM | POA: Diagnosis present

## 2016-02-10 DIAGNOSIS — K219 Gastro-esophageal reflux disease without esophagitis: Secondary | ICD-10-CM | POA: Diagnosis present

## 2016-02-10 DIAGNOSIS — R918 Other nonspecific abnormal finding of lung field: Secondary | ICD-10-CM | POA: Diagnosis not present

## 2016-02-10 DIAGNOSIS — I739 Peripheral vascular disease, unspecified: Secondary | ICD-10-CM | POA: Diagnosis not present

## 2016-02-10 DIAGNOSIS — Z96651 Presence of right artificial knee joint: Secondary | ICD-10-CM | POA: Diagnosis not present

## 2016-02-10 DIAGNOSIS — N179 Acute kidney failure, unspecified: Secondary | ICD-10-CM | POA: Diagnosis not present

## 2016-02-10 DIAGNOSIS — Y95 Nosocomial condition: Secondary | ICD-10-CM | POA: Diagnosis present

## 2016-02-10 DIAGNOSIS — Z79899 Other long term (current) drug therapy: Secondary | ICD-10-CM

## 2016-02-10 DIAGNOSIS — Z8711 Personal history of peptic ulcer disease: Secondary | ICD-10-CM

## 2016-02-10 DIAGNOSIS — Z8249 Family history of ischemic heart disease and other diseases of the circulatory system: Secondary | ICD-10-CM

## 2016-02-10 DIAGNOSIS — D649 Anemia, unspecified: Secondary | ICD-10-CM | POA: Diagnosis not present

## 2016-02-10 DIAGNOSIS — Z955 Presence of coronary angioplasty implant and graft: Secondary | ICD-10-CM

## 2016-02-10 DIAGNOSIS — Z8546 Personal history of malignant neoplasm of prostate: Secondary | ICD-10-CM

## 2016-02-10 DIAGNOSIS — I10 Essential (primary) hypertension: Secondary | ICD-10-CM | POA: Diagnosis not present

## 2016-02-10 DIAGNOSIS — Z825 Family history of asthma and other chronic lower respiratory diseases: Secondary | ICD-10-CM | POA: Diagnosis not present

## 2016-02-10 DIAGNOSIS — J9611 Chronic respiratory failure with hypoxia: Secondary | ICD-10-CM | POA: Diagnosis present

## 2016-02-10 DIAGNOSIS — J189 Pneumonia, unspecified organism: Secondary | ICD-10-CM | POA: Diagnosis present

## 2016-02-10 DIAGNOSIS — Z87891 Personal history of nicotine dependence: Secondary | ICD-10-CM | POA: Diagnosis not present

## 2016-02-10 DIAGNOSIS — Z79891 Long term (current) use of opiate analgesic: Secondary | ICD-10-CM

## 2016-02-10 DIAGNOSIS — J440 Chronic obstructive pulmonary disease with acute lower respiratory infection: Principal | ICD-10-CM | POA: Diagnosis present

## 2016-02-10 DIAGNOSIS — I509 Heart failure, unspecified: Secondary | ICD-10-CM | POA: Diagnosis not present

## 2016-02-10 DIAGNOSIS — J449 Chronic obstructive pulmonary disease, unspecified: Secondary | ICD-10-CM | POA: Diagnosis present

## 2016-02-10 DIAGNOSIS — J441 Chronic obstructive pulmonary disease with (acute) exacerbation: Secondary | ICD-10-CM | POA: Diagnosis not present

## 2016-02-10 DIAGNOSIS — I2510 Atherosclerotic heart disease of native coronary artery without angina pectoris: Secondary | ICD-10-CM | POA: Diagnosis not present

## 2016-02-10 DIAGNOSIS — Z7951 Long term (current) use of inhaled steroids: Secondary | ICD-10-CM

## 2016-02-10 DIAGNOSIS — Z681 Body mass index (BMI) 19 or less, adult: Secondary | ICD-10-CM

## 2016-02-10 DIAGNOSIS — I110 Hypertensive heart disease with heart failure: Secondary | ICD-10-CM | POA: Diagnosis not present

## 2016-02-10 DIAGNOSIS — Z7952 Long term (current) use of systemic steroids: Secondary | ICD-10-CM | POA: Diagnosis not present

## 2016-02-10 DIAGNOSIS — R079 Chest pain, unspecified: Secondary | ICD-10-CM

## 2016-02-10 LAB — CBC WITH DIFFERENTIAL/PLATELET
BASOS PCT: 0 %
Basophils Absolute: 0 10*3/uL (ref 0.0–0.1)
EOS ABS: 0 10*3/uL (ref 0.0–0.7)
EOS PCT: 0 %
HEMATOCRIT: 27.5 % — AB (ref 39.0–52.0)
HEMOGLOBIN: 8.9 g/dL — AB (ref 13.0–17.0)
LYMPHS PCT: 10 %
Lymphs Abs: 1.3 10*3/uL (ref 0.7–4.0)
MCH: 28.3 pg (ref 26.0–34.0)
MCHC: 32.4 g/dL (ref 30.0–36.0)
MCV: 87.3 fL (ref 78.0–100.0)
MONOS PCT: 6 %
Monocytes Absolute: 0.8 10*3/uL (ref 0.1–1.0)
NEUTROS ABS: 11.3 10*3/uL — AB (ref 1.7–7.7)
NEUTROS PCT: 84 %
Platelets: 333 10*3/uL (ref 150–400)
RBC: 3.15 MIL/uL — ABNORMAL LOW (ref 4.22–5.81)
RDW: 14.9 % (ref 11.5–15.5)
WBC: 13.4 10*3/uL — ABNORMAL HIGH (ref 4.0–10.5)

## 2016-02-10 LAB — COMPREHENSIVE METABOLIC PANEL
ALBUMIN: 2.6 g/dL — AB (ref 3.5–5.0)
ALK PHOS: 75 U/L (ref 38–126)
ALT: 16 U/L — ABNORMAL LOW (ref 17–63)
ANION GAP: 8 (ref 5–15)
AST: 24 U/L (ref 15–41)
BILIRUBIN TOTAL: 0.4 mg/dL (ref 0.3–1.2)
BUN: 8 mg/dL (ref 6–20)
CALCIUM: 8.8 mg/dL — AB (ref 8.9–10.3)
CO2: 27 mmol/L (ref 22–32)
Chloride: 102 mmol/L (ref 101–111)
Creatinine, Ser: 0.81 mg/dL (ref 0.61–1.24)
GFR calc Af Amer: >60 mL/min (ref >60)
GLUCOSE: 119 mg/dL — AB (ref 65–99)
Potassium: 3.7 mmol/L (ref 3.5–5.1)
Sodium: 137 mmol/L (ref 135–145)
TOTAL PROTEIN: 5.4 g/dL — AB (ref 6.5–8.1)

## 2016-02-10 LAB — BRAIN NATRIURETIC PEPTIDE: B Natriuretic Peptide: 151.5 pg/mL — ABNORMAL HIGH (ref 0.0–100.0)

## 2016-02-10 LAB — I-STAT TROPONIN, ED: TROPONIN I, POC: 0.03 ng/mL (ref 0.00–0.08)

## 2016-02-10 LAB — LIPASE, BLOOD: LIPASE: 12 U/L (ref 11–51)

## 2016-02-10 LAB — LACTIC ACID, PLASMA: Lactic Acid, Venous: 0.8 mmol/L (ref 0.5–1.9)

## 2016-02-10 MED ORDER — VANCOMYCIN HCL IN DEXTROSE 1-5 GM/200ML-% IV SOLN
1000.0000 mg | Freq: Once | INTRAVENOUS | Status: AC
  Administered 2016-02-11: 1000 mg via INTRAVENOUS
  Filled 2016-02-10: qty 200

## 2016-02-10 MED ORDER — DEXTROSE 5 % IV SOLN
2.0000 g | Freq: Once | INTRAVENOUS | Status: AC
  Administered 2016-02-10: 2 g via INTRAVENOUS
  Filled 2016-02-10: qty 2
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
## 2016-02-10 NOTE — ED Notes (Signed)
Pt to ED from home c/o shortness of breath and chest tightness. Diagnosed with pneumonia in May, recently finished prednisone. Reports feeling better with steroids and feeling worse over several days. Has used inhaler at home without improvement. EMS gave duoneb x 1 initial lung sounds were crackles on R and L. Rhonchi noted to L side and wheezing to R side.

## 2016-02-11 ENCOUNTER — Encounter (HOSPITAL_COMMUNITY): Payer: Self-pay | Admitting: Internal Medicine

## 2016-02-11 ENCOUNTER — Inpatient Hospital Stay (HOSPITAL_COMMUNITY): Payer: Medicare Other

## 2016-02-11 DIAGNOSIS — Z7951 Long term (current) use of inhaled steroids: Secondary | ICD-10-CM | POA: Diagnosis not present

## 2016-02-11 DIAGNOSIS — Y95 Nosocomial condition: Secondary | ICD-10-CM | POA: Diagnosis present

## 2016-02-11 DIAGNOSIS — I2510 Atherosclerotic heart disease of native coronary artery without angina pectoris: Secondary | ICD-10-CM | POA: Diagnosis not present

## 2016-02-11 DIAGNOSIS — D649 Anemia, unspecified: Secondary | ICD-10-CM | POA: Diagnosis not present

## 2016-02-11 DIAGNOSIS — Z8249 Family history of ischemic heart disease and other diseases of the circulatory system: Secondary | ICD-10-CM | POA: Diagnosis not present

## 2016-02-11 DIAGNOSIS — Z9981 Dependence on supplemental oxygen: Secondary | ICD-10-CM | POA: Diagnosis not present

## 2016-02-11 DIAGNOSIS — N179 Acute kidney failure, unspecified: Secondary | ICD-10-CM | POA: Diagnosis present

## 2016-02-11 DIAGNOSIS — Z7952 Long term (current) use of systemic steroids: Secondary | ICD-10-CM | POA: Diagnosis not present

## 2016-02-11 DIAGNOSIS — I509 Heart failure, unspecified: Secondary | ICD-10-CM | POA: Diagnosis present

## 2016-02-11 DIAGNOSIS — Z87891 Personal history of nicotine dependence: Secondary | ICD-10-CM | POA: Diagnosis not present

## 2016-02-11 DIAGNOSIS — E785 Hyperlipidemia, unspecified: Secondary | ICD-10-CM | POA: Diagnosis present

## 2016-02-11 DIAGNOSIS — Z955 Presence of coronary angioplasty implant and graft: Secondary | ICD-10-CM | POA: Diagnosis not present

## 2016-02-11 DIAGNOSIS — R627 Adult failure to thrive: Secondary | ICD-10-CM | POA: Diagnosis present

## 2016-02-11 DIAGNOSIS — J449 Chronic obstructive pulmonary disease, unspecified: Secondary | ICD-10-CM | POA: Diagnosis not present

## 2016-02-11 DIAGNOSIS — I10 Essential (primary) hypertension: Secondary | ICD-10-CM | POA: Diagnosis not present

## 2016-02-11 DIAGNOSIS — Z7982 Long term (current) use of aspirin: Secondary | ICD-10-CM | POA: Diagnosis not present

## 2016-02-11 DIAGNOSIS — K219 Gastro-esophageal reflux disease without esophagitis: Secondary | ICD-10-CM | POA: Diagnosis present

## 2016-02-11 DIAGNOSIS — Z79899 Other long term (current) drug therapy: Secondary | ICD-10-CM | POA: Diagnosis not present

## 2016-02-11 DIAGNOSIS — Z96651 Presence of right artificial knee joint: Secondary | ICD-10-CM | POA: Diagnosis present

## 2016-02-11 DIAGNOSIS — R0602 Shortness of breath: Secondary | ICD-10-CM | POA: Diagnosis not present

## 2016-02-11 DIAGNOSIS — I110 Hypertensive heart disease with heart failure: Secondary | ICD-10-CM | POA: Diagnosis present

## 2016-02-11 DIAGNOSIS — I739 Peripheral vascular disease, unspecified: Secondary | ICD-10-CM | POA: Diagnosis present

## 2016-02-11 DIAGNOSIS — Z79891 Long term (current) use of opiate analgesic: Secondary | ICD-10-CM | POA: Diagnosis not present

## 2016-02-11 DIAGNOSIS — Z825 Family history of asthma and other chronic lower respiratory diseases: Secondary | ICD-10-CM | POA: Diagnosis not present

## 2016-02-11 DIAGNOSIS — J440 Chronic obstructive pulmonary disease with acute lower respiratory infection: Secondary | ICD-10-CM | POA: Diagnosis present

## 2016-02-11 DIAGNOSIS — Z8711 Personal history of peptic ulcer disease: Secondary | ICD-10-CM | POA: Diagnosis not present

## 2016-02-11 DIAGNOSIS — J9611 Chronic respiratory failure with hypoxia: Secondary | ICD-10-CM | POA: Diagnosis present

## 2016-02-11 DIAGNOSIS — J189 Pneumonia, unspecified organism: Secondary | ICD-10-CM | POA: Diagnosis not present

## 2016-02-11 DIAGNOSIS — Z681 Body mass index (BMI) 19 or less, adult: Secondary | ICD-10-CM | POA: Diagnosis not present

## 2016-02-11 DIAGNOSIS — Z8546 Personal history of malignant neoplasm of prostate: Secondary | ICD-10-CM | POA: Diagnosis not present

## 2016-02-11 DIAGNOSIS — J441 Chronic obstructive pulmonary disease with (acute) exacerbation: Secondary | ICD-10-CM | POA: Diagnosis present

## 2016-02-11 LAB — URINE MICROSCOPIC-ADD ON

## 2016-02-11 LAB — COMPREHENSIVE METABOLIC PANEL
ALBUMIN: 2.7 g/dL — AB (ref 3.5–5.0)
ALT: 18 U/L (ref 17–63)
AST: 29 U/L (ref 15–41)
Alkaline Phosphatase: 80 U/L (ref 38–126)
Anion gap: 12 (ref 5–15)
BILIRUBIN TOTAL: 0.6 mg/dL (ref 0.3–1.2)
BUN: 8 mg/dL (ref 6–20)
CHLORIDE: 100 mmol/L — AB (ref 101–111)
CO2: 26 mmol/L (ref 22–32)
Calcium: 8.8 mg/dL — ABNORMAL LOW (ref 8.9–10.3)
Creatinine, Ser: 0.76 mg/dL (ref 0.61–1.24)
GFR calc Af Amer: >60 mL/min (ref >60)
GFR calc non Af Amer: >60 mL/min (ref >60)
GLUCOSE: 92 mg/dL (ref 65–99)
POTASSIUM: 4 mmol/L (ref 3.5–5.1)
SODIUM: 138 mmol/L (ref 135–145)
TOTAL PROTEIN: 6.1 g/dL — AB (ref 6.5–8.1)

## 2016-02-11 LAB — CBC WITH DIFFERENTIAL/PLATELET
Basophils Absolute: 0 10*3/uL (ref 0.0–0.1)
Basophils Relative: 0 %
EOS PCT: 0 %
Eosinophils Absolute: 0.1 10*3/uL (ref 0.0–0.7)
HEMATOCRIT: 29.2 % — AB (ref 39.0–52.0)
Hemoglobin: 9.3 g/dL — ABNORMAL LOW (ref 13.0–17.0)
LYMPHS ABS: 0.9 10*3/uL (ref 0.7–4.0)
LYMPHS PCT: 7 %
MCH: 28.4 pg (ref 26.0–34.0)
MCHC: 31.8 g/dL (ref 30.0–36.0)
MCV: 89.3 fL (ref 78.0–100.0)
MONO ABS: 1.9 10*3/uL — AB (ref 0.1–1.0)
MONOS PCT: 15 %
Neutro Abs: 10.3 10*3/uL — ABNORMAL HIGH (ref 1.7–7.7)
Neutrophils Relative %: 78 %
PLATELETS: 640 10*3/uL — AB (ref 150–400)
RBC: 3.27 MIL/uL — ABNORMAL LOW (ref 4.22–5.81)
RDW: 15.1 % (ref 11.5–15.5)
WBC: 13.2 10*3/uL — ABNORMAL HIGH (ref 4.0–10.5)

## 2016-02-11 LAB — I-STAT CG4 LACTIC ACID, ED: LACTIC ACID, VENOUS: 0.5 mmol/L (ref 0.5–1.9)

## 2016-02-11 LAB — I-STAT TROPONIN, ED: Troponin i, poc: 0.04 ng/mL (ref 0.00–0.08)

## 2016-02-11 LAB — URINALYSIS, ROUTINE W REFLEX MICROSCOPIC
Bilirubin Urine: NEGATIVE
GLUCOSE, UA: NEGATIVE mg/dL
HGB URINE DIPSTICK: NEGATIVE
KETONES UR: NEGATIVE mg/dL
LEUKOCYTES UA: NEGATIVE
Nitrite: NEGATIVE
PH: 5 (ref 5.0–8.0)
PROTEIN: 30 mg/dL — AB
Specific Gravity, Urine: 1.015 (ref 1.005–1.030)

## 2016-02-11 LAB — EXPECTORATED SPUTUM ASSESSMENT W GRAM STAIN, RFLX TO RESP C

## 2016-02-11 LAB — GLUCOSE, CAPILLARY: Glucose-Capillary: 100 mg/dL — ABNORMAL HIGH (ref 65–99)

## 2016-02-11 LAB — TROPONIN I
Troponin I: 0.05 ng/mL (ref <0.03)
Troponin I: 0.04 ng/mL (ref <0.03)

## 2016-02-11 LAB — EXPECTORATED SPUTUM ASSESSMENT W REFEX TO RESP CULTURE

## 2016-02-11 LAB — STREP PNEUMONIAE URINARY ANTIGEN: Strep Pneumo Urinary Antigen: NEGATIVE

## 2016-02-11 LAB — LACTIC ACID, PLASMA: LACTIC ACID, VENOUS: 0.7 mmol/L (ref 0.5–1.9)

## 2016-02-11 MED ORDER — ALBUTEROL SULFATE (2.5 MG/3ML) 0.083% IN NEBU: 2.5000 mg | INHALATION_SOLUTION | Freq: Four times a day (QID) | RESPIRATORY_TRACT | Status: DC

## 2016-02-11 MED ORDER — ISOSORBIDE MONONITRATE ER 60 MG PO TB24
60.0000 mg | ORAL_TABLET | Freq: Every day | ORAL | Status: DC
  Administered 2016-02-11 – 2016-02-16 (×6): 60 mg via ORAL
  Filled 2016-02-11 (×6): qty 1

## 2016-02-11 MED ORDER — MOMETASONE FURO-FORMOTEROL FUM 200-5 MCG/ACT IN AERO
2.0000 | INHALATION_SPRAY | Freq: Two times a day (BID) | RESPIRATORY_TRACT | Status: DC
  Administered 2016-02-11 – 2016-02-16 (×11): 2 via RESPIRATORY_TRACT
  Filled 2016-02-11: qty 8.8

## 2016-02-11 MED ORDER — ALBUTEROL SULFATE (2.5 MG/3ML) 0.083% IN NEBU
2.5000 mg | INHALATION_SOLUTION | RESPIRATORY_TRACT | Status: DC | PRN
  Administered 2016-02-15: 2.5 mg via RESPIRATORY_TRACT
  Filled 2016-02-11: qty 3

## 2016-02-11 MED ORDER — DEXTROSE 5 % IV SOLN
2.0000 g | INTRAVENOUS | Status: DC
  Administered 2016-02-11 – 2016-02-13 (×3): 2 g via INTRAVENOUS
  Filled 2016-02-11 (×4): qty 2

## 2016-02-11 MED ORDER — ONDANSETRON HCL 4 MG/2ML IJ SOLN: 4.0000 mg | Freq: Four times a day (QID) | INTRAMUSCULAR | Status: DC | PRN

## 2016-02-11 MED ORDER — ATORVASTATIN CALCIUM 10 MG PO TABS
10.0000 mg | ORAL_TABLET | Freq: Every day | ORAL | Status: DC
  Administered 2016-02-11 – 2016-02-15 (×5): 10 mg via ORAL
  Filled 2016-02-11 (×5): qty 1

## 2016-02-11 MED ORDER — DEXTROSE 5 % IV SOLN: 1.0000 g | Freq: Three times a day (TID) | INTRAVENOUS | Status: DC

## 2016-02-11 MED ORDER — ENSURE ENLIVE PO LIQD
237.0000 mL | Freq: Three times a day (TID) | ORAL | Status: DC
  Administered 2016-02-11 – 2016-02-15 (×12): 237 mL via ORAL

## 2016-02-11 MED ORDER — SPIRONOLACTONE 25 MG PO TABS
25.0000 mg | ORAL_TABLET | Freq: Every day | ORAL | Status: DC
  Administered 2016-02-11 – 2016-02-14 (×4): 25 mg via ORAL
  Filled 2016-02-11 (×4): qty 1

## 2016-02-11 MED ORDER — IPRATROPIUM-ALBUTEROL 0.5-2.5 (3) MG/3ML IN SOLN
3.0000 mL | Freq: Four times a day (QID) | RESPIRATORY_TRACT | Status: DC
Start: 2016-02-11 — End: 2016-02-14
  Administered 2016-02-11 – 2016-02-14 (×12): 3 mL via RESPIRATORY_TRACT
  Filled 2016-02-11 (×12): qty 3

## 2016-02-11 MED ORDER — IOPAMIDOL (ISOVUE-370) INJECTION 76%
INTRAVENOUS | Status: AC
  Administered 2016-02-11: 100 mL
  Filled 2016-02-11: qty 100

## 2016-02-11 MED ORDER — PANTOPRAZOLE SODIUM 40 MG PO TBEC
40.0000 mg | DELAYED_RELEASE_TABLET | Freq: Every day | ORAL | Status: DC
  Administered 2016-02-11 – 2016-02-16 (×6): 40 mg via ORAL
  Filled 2016-02-11 (×6): qty 1

## 2016-02-11 MED ORDER — IPRATROPIUM BROMIDE 0.02 % IN SOLN: 0.5000 mg | Freq: Four times a day (QID) | RESPIRATORY_TRACT | Status: DC

## 2016-02-11 MED ORDER — ACETAMINOPHEN 650 MG RE SUPP
650.0000 mg | Freq: Four times a day (QID) | RECTAL | Status: DC | PRN
  Filled 2016-02-11: qty 1

## 2016-02-11 MED ORDER — ONDANSETRON HCL 4 MG PO TABS: 4.0000 mg | ORAL_TABLET | Freq: Four times a day (QID) | ORAL | Status: DC | PRN

## 2016-02-11 MED ORDER — ACETAMINOPHEN 325 MG PO TABS
650.0000 mg | ORAL_TABLET | Freq: Four times a day (QID) | ORAL | Status: DC | PRN
  Administered 2016-02-12 – 2016-02-15 (×4): 650 mg via ORAL
  Filled 2016-02-11 (×5): qty 2

## 2016-02-11 MED ORDER — METHYLPREDNISOLONE SODIUM SUCC 125 MG IJ SOLR
60.0000 mg | Freq: Four times a day (QID) | INTRAMUSCULAR | Status: DC
  Administered 2016-02-11 – 2016-02-14 (×13): 60 mg via INTRAVENOUS
  Filled 2016-02-11 (×13): qty 2

## 2016-02-11 MED ORDER — TORSEMIDE 20 MG PO TABS
20.0000 mg | ORAL_TABLET | Freq: Every day | ORAL | Status: DC
  Administered 2016-02-11 – 2016-02-14 (×4): 20 mg via ORAL
  Filled 2016-02-11 (×4): qty 1

## 2016-02-11 MED ORDER — FENTANYL CITRATE (PF) 100 MCG/2ML IJ SOLN
25.0000 ug | Freq: Once | INTRAMUSCULAR | Status: AC
  Administered 2016-02-11: 25 ug via INTRAVENOUS
  Filled 2016-02-11: qty 2

## 2016-02-11 MED ORDER — TIOTROPIUM BROMIDE MONOHYDRATE 18 MCG IN CAPS
18.0000 ug | ORAL_CAPSULE | Freq: Every day | RESPIRATORY_TRACT | Status: DC
  Administered 2016-02-12 – 2016-02-16 (×5): 18 ug via RESPIRATORY_TRACT
  Filled 2016-02-11: qty 5

## 2016-02-11 MED ORDER — METOPROLOL SUCCINATE ER 25 MG PO TB24
25.0000 mg | ORAL_TABLET | Freq: Every day | ORAL | Status: DC
  Administered 2016-02-11 – 2016-02-13 (×3): 25 mg via ORAL
  Filled 2016-02-11 (×3): qty 1

## 2016-02-11 MED ORDER — ASPIRIN EC 81 MG PO TBEC
81.0000 mg | DELAYED_RELEASE_TABLET | Freq: Every day | ORAL | Status: DC
  Administered 2016-02-11 – 2016-02-16 (×6): 81 mg via ORAL
  Filled 2016-02-11 (×6): qty 1

## 2016-02-11 MED ORDER — ENOXAPARIN SODIUM 40 MG/0.4ML ~~LOC~~ SOLN
40.0000 mg | SUBCUTANEOUS | Status: DC
  Administered 2016-02-11 – 2016-02-12 (×2): 40 mg via SUBCUTANEOUS
  Filled 2016-02-11 (×2): qty 0.4

## 2016-02-11 MED ORDER — VANCOMYCIN HCL IN DEXTROSE 1-5 GM/200ML-% IV SOLN
1000.0000 mg | INTRAVENOUS | Status: DC
  Administered 2016-02-11: 1000 mg via INTRAVENOUS
  Filled 2016-02-11: qty 200

## 2016-02-11 MED ORDER — NITROGLYCERIN 0.4 MG SL SUBL: 0.4000 mg | SUBLINGUAL_TABLET | SUBLINGUAL | Status: DC | PRN

## 2016-02-11 MED ORDER — AMLODIPINE BESYLATE 10 MG PO TABS
10.0000 mg | ORAL_TABLET | Freq: Every day | ORAL | Status: DC
  Administered 2016-02-11 – 2016-02-16 (×6): 10 mg via ORAL
  Filled 2016-02-11 (×6): qty 1

## 2016-02-11 NOTE — Progress Notes (Signed)
Pharmacy Antibiotic Note  Cristian Collins is a 82 y.o. male admitted on 02/10/2016 with pneumonia.  Pharmacy has been consulted for vancomycin and cefepime dosing.  Plan: Vancomycin 1000mg  IV every 24 hours.  Goal trough 15-20 mcg/mL.  Cefepime 2g IV every 24 hours.  Height: 5\' 6"  (167.6 cm) Weight: 97 lb 6.4 oz (44.18 kg) IBW/kg (Calculated) : 63.8  Temp (24hrs), Avg:98.6 F (37 C), Min:98.2 F (36.8 C), Max:99 F (37.2 C)   Recent Labs Lab 02/10/16 2205 02/11/16 0105 02/11/16 0114  WBC 13.4*  --   --   CREATININE 0.81  --   --   LATICACIDVEN 0.8 0.7 0.50    Estimated Creatinine Clearance: 43.2 mL/min (by C-G formula based on Cr of 0.81).    No Known Allergies    Thank you for allowing pharmacy to be a part of this patient's care.  Wynona Neat, PharmD, BCPS  02/11/2016 3:13 AM

## 2016-02-11 NOTE — ED Notes (Signed)
Hospitalist a the bedside. 

## 2016-02-11 NOTE — ED Notes (Signed)
Pt requesting pain medication, admitting MD notified, no new orders gotten, MD to evaluate pt before given medications, report given to RN on [address]E [place] and notified, pt updated on POC and MD to see pt first.

## 2016-02-11 NOTE — Progress Notes (Signed)
Called bt lab with critical troponin of 0.04. Dr Broadus John texted
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
## 2016-02-11 NOTE — Progress Notes (Signed)
Pt seen and examined, admitted earlier this am by Dr.Kakrakandy 85/M with COPD stage C COPD on 2-3L Home O2 with recurrent pneumonia/infectious nodule noted on CTA in left upper lobe, in May had RLL pneumonia, also history suggestive of dysphagia Now wheezing too, continue HCAP coverage, add Iv solumedrol, Duonebs Q6 and PRN SLP evaluation Will need quick FU with Dr.Wert (pts pulmonologist) ABG if more distress  Domenic Polite, MD

## 2016-02-12 ENCOUNTER — Inpatient Hospital Stay (HOSPITAL_COMMUNITY): Payer: Medicare Other

## 2016-02-12 LAB — BASIC METABOLIC PANEL
ANION GAP: 9 (ref 5–15)
BUN: 13 mg/dL (ref 6–20)
CALCIUM: 9 mg/dL (ref 8.9–10.3)
CO2: 31 mmol/L (ref 22–32)
CREATININE: 0.99 mg/dL (ref 0.61–1.24)
Chloride: 95 mmol/L — ABNORMAL LOW (ref 101–111)
GFR calc Af Amer: >60 mL/min (ref >60)
GFR calc non Af Amer: >60 mL/min (ref >60)
GLUCOSE: 225 mg/dL — AB (ref 65–99)
Potassium: 3.8 mmol/L (ref 3.5–5.1)
Sodium: 135 mmol/L (ref 135–145)

## 2016-02-12 LAB — CBC
HEMATOCRIT: 28.7 % — AB (ref 39.0–52.0)
Hemoglobin: 9.2 g/dL — ABNORMAL LOW (ref 13.0–17.0)
MCH: 28.1 pg (ref 26.0–34.0)
MCHC: 32.1 g/dL (ref 30.0–36.0)
MCV: 87.8 fL (ref 78.0–100.0)
Platelets: 378 10*3/uL (ref 150–400)
RBC: 3.27 MIL/uL — ABNORMAL LOW (ref 4.22–5.81)
RDW: 14.6 % (ref 11.5–15.5)
WBC: 11.7 10*3/uL — ABNORMAL HIGH (ref 4.0–10.5)

## 2016-02-12 LAB — LEGIONELLA PNEUMOPHILA SEROGP 1 UR AG: L. pneumophila Serogp 1 Ur Ag: NEGATIVE

## 2016-02-12 LAB — HIV ANTIBODY (ROUTINE TESTING W REFLEX): HIV Screen 4th Generation wRfx: NONREACTIVE

## 2016-02-12 LAB — URINE CULTURE

## 2016-02-12 MED ORDER — ENOXAPARIN SODIUM 30 MG/0.3ML ~~LOC~~ SOLN
30.0000 mg | SUBCUTANEOUS | Status: DC
  Administered 2016-02-13 – 2016-02-16 (×3): 30 mg via SUBCUTANEOUS
  Filled 2016-02-12 (×3): qty 0.3

## 2016-02-12 NOTE — Progress Notes (Signed)
Initial Nutrition Assessment  DOCUMENTATION CODES:   Severe malnutrition in context of chronic illness, Underweight  INTERVENTION:  Continue Ensure Enlive po TID, each supplement provides 350 kcal and 20 grams of protein.  Encourage adequate PO intake.   NUTRITION DIAGNOSIS:   Malnutrition related to chronic illness as evidenced by severe depletion of body fat, severe depletion of muscle mass.  GOAL:   Patient will meet greater than or equal to 90% of their needs  MONITOR:   PO intake, Supplement acceptance, Weight trends, Labs, I & O's  REASON FOR ASSESSMENT:   Malnutrition Screening Tool    ASSESSMENT:   82 y.o. male with CAD status post stenting, CHF, COPD, chronic anemia presents to the ER because of worsening shortness of breath.  Meal completion has been 50-100%. Pt reports appetite has been fine currently and PTA with usual consumption of at least 2-3 meals a day with Ensure shakes 2-3 times daily. Pt with a 12.2% weight loss over the past 2 months, however may be associated with fluid status. Pt currently has Ensure ordered and has been consuming them. RD to continue with orders. Pt encouraged to eat his food at meals and to drink his supplements.   Nutrition-Focused physical exam completed. Findings are severe fat depletion, severe muscle depletion, and no edema.   Labs and medications reviewed.   Diet Order:  Diet Heart Room service appropriate?: Yes; Fluid consistency:: Thin; Fluid restriction:: 1200 mL Fluid  Skin:  Reviewed, no issues  Last BM:  7/3  Height:   Ht Readings from Last 1 Encounters:  02/11/16 5\' 6"  (1.676 m)    Weight:   Wt Readings from Last 1 Encounters:  02/12/16 93 lb 4.8 oz (42.321 kg)    Ideal Body Weight:  64.5 kg  BMI:  Body mass index is 15.07 kg/(m^2).  Estimated Nutritional Needs:   Kcal:  1450-1700  Protein:  65-75 grams  Fluid:  1.2 L/day  EDUCATION NEEDS:   No education needs identified at this  time  Corrin Parker, MS, RD, LDN Pager # [phone] After hours/ weekend pager # [phone]
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
## 2016-02-12 NOTE — Progress Notes (Signed)
Patient has complained off and on during this nurse shift that he feels oxygen just not working.This nurse and nurse tech has checked patient oxygen levels multiple times during and oxygen saturations are 100% on 3 liters nasal canula.Will continue to monitor.

## 2016-02-12 NOTE — Care Management Important Message (Signed)
Important Message  Patient Details  Name: Cristian Collins MRN: [id number] Date of Birth: 1929-11-10   Medicare Important Message Given:  Yes    [name] 02/12/2016, 10:53 AM

## 2016-02-12 NOTE — Progress Notes (Signed)
MBSS complete. Full report located under chart review in imaging section.  Shafer Swamy Paiewonsky, M.A. CCC-SLP [phone]  

## 2016-02-12 NOTE — Progress Notes (Signed)
Scheduled neb not given, pt is sleeping comfortably at this time no distress or complications noted. PRN neb will be given if needed

## 2016-02-12 NOTE — Progress Notes (Signed)
PROGRESS NOTE    Cristian Collins  [id number] DOB: 03-May-1930 DOA: 02/10/2016 PCP: [name], MD  Brief Narrative: Cristian Collins is a 82 y.o. male with CAD status post stenting, CHF, COPD, chronic anemia presents to the ER because of worsening shortness of breath. Patient states at baseline patient he is short of breath all the time but last 24 hours he had acute worsening of shortness of breath with productive cough. Patient is also mildly febrile. Chest x-ray shows left-sided infiltrates concerning for pneumonia. Patient was admitted about 30 days ago for pneumonia. Patient also has been having some pleuritic-type of chest pain mostly on the right side. EKG was unremarkable and troponins are negative   Assessment & Plan: 1.  Recurrent Pneumonia/infectious nodule  -infectious nodule noted on CTA in left upper lobe, in May had RLL pneumonia  -history suggestive of dysphagia, SLP eval requested  -stop Vanc/Continue cefepime  -due to weight loss and failure to thrive, needs Pulm FU for eval for Bronch etc  -followed by Dr.Wert    2. COPD exacerbation -improving, continue IV steroids, abx, nebs -chronic resp failure on 2-3L home O2  3. CAD status post stenting/chronic diastolic CHF -troponin [id number], no symptoms of ACS -continue ASA/Toprol/statin  -continue torsemide  4. Chronic anemia  -stable, chronic  5. Hypertension  - continue amlodipine and beta blockers.  DVT prophylaxis: Lovenox. Code Status: Full code.  Family Communication: No family at the bedside.  Disposition Plan: Home in 1-2days   Antimicrobials:Rocephin/zithromax  Subjective: breathing starting to improve  Objective: Filed Vitals:   02/11/16 2019 02/12/16 0654 02/12/16 0755 02/12/16 1001  BP:  166/73 149/49 148/46  Pulse:  70 88 82  Temp:  98.1 F (36.7 C) 97.7 F (36.5 C)   TempSrc:  Oral Oral   Resp:  22 20   Height:      Weight:  42.321 kg (93 lb 4.8 oz)    SpO2: 99% 100% 100%      Intake/Output Summary (Last 24 hours) at 02/12/16 1114 Last data filed at 02/12/16 [id number]  Gross per 24 hour  Intake   1200 ml  Output    850 ml  Net    350 ml   Filed Weights   02/10/16 2300 02/11/16 0257 02/12/16 0654  Weight: 42.2 kg (93 lb 0.6 oz) 44.18 kg (97 lb 6.4 oz) 42.321 kg (93 lb 4.8 oz)    Examination:  General exam: Frail thinly built male  Respiratory system: improved air movement, rare wheezes Cardiovascular system: S1 & S2 heard, RRR. No JVD, murmurs, rubs, gallops or clicks. No pedal edema. Gastrointestinal system: Abdomen is nondistended, soft and nontender. No organomegaly or masses felt. Normal bowel sounds  Central nervous system: Alert and oriented. No focal neurological deficits. Extremities: Symmetric 5 x 5 power. Skin: No rashes, lesions or ulcers Psychiatry: Judgement and insight appear normal. Mood & affect appropriate.     Data Reviewed: I have personally reviewed following labs and imaging studies  CBC:  Recent Labs Lab 02/10/16 2205 02/11/16 0445 02/12/16 0828  WBC 13.4* 13.2* 11.7*  NEUTROABS 11.3* 10.3*  --   HGB 8.9* 9.3* 9.2*  HCT 27.5* 29.2* 28.7*  MCV 87.3 89.3 87.8  PLT 333 640* [id number]   Basic Metabolic Panel:  Recent Labs Lab 02/10/16 2205 02/11/16 0445 02/12/16 0828  NA 137 138 135  K 3.7 4.0 3.8  CL 102 100* 95*  CO2 27 26 31   GLUCOSE 119* 92 225*  BUN 8 8  13  CREATININE 0.81 0.76 0.99  CALCIUM 8.8* 8.8* 9.0   GFR: Estimated Creatinine Clearance: 33.8 mL/min (by C-G formula based on Cr of 0.99). Liver Function Tests:  Recent Labs Lab 02/10/16 2205 02/11/16 0445  AST 24 29  ALT 16* 18  ALKPHOS 75 80  BILITOT 0.4 0.6  PROT 5.4* 6.1*  ALBUMIN 2.6* 2.7*    Recent Labs Lab 02/10/16 2205  LIPASE 12   No results for input(s): AMMONIA in the last 168 hours. Coagulation Profile: No results for input(s): INR, PROTIME in the last 168 hours. Cardiac Enzymes:  Recent Labs Lab 02/11/16 0708 02/11/16 1355   TROPONINI 0.05* 0.04*   BNP (last 3 results) No results for input(s): PROBNP in the last 8760 hours. HbA1C: No results for input(s): HGBA1C in the last 72 hours. CBG:  Recent Labs Lab 02/11/16 1127  GLUCAP 100*   Lipid Profile: No results for input(s): CHOL, HDL, LDLCALC, TRIG, CHOLHDL, LDLDIRECT in the last 72 hours. Thyroid Function Tests: No results for input(s): TSH, T4TOTAL, FREET4, T3FREE, THYROIDAB in the last 72 hours. Anemia Panel: No results for input(s): VITAMINB12, FOLATE, FERRITIN, TIBC, IRON, RETICCTPCT in the last 72 hours. Urine analysis:    Component Value Date/Time   COLORURINE YELLOW 02/11/2016 0029   APPEARANCEUR CLEAR 02/11/2016 0029   LABSPEC 1.015 02/11/2016 0029   PHURINE 5.0 02/11/2016 0029   GLUCOSEU NEGATIVE 02/11/2016 0029   HGBUR NEGATIVE 02/11/2016 0029   BILIRUBINUR NEGATIVE 02/11/2016 0029   KETONESUR NEGATIVE 02/11/2016 0029   PROTEINUR 30* 02/11/2016 0029   UROBILINOGEN 0.2 10/08/2014 0838   NITRITE NEGATIVE 02/11/2016 0029   LEUKOCYTESUR NEGATIVE 02/11/2016 0029   Sepsis Labs: @LABRCNTIP (procalcitonin:4,lacticidven:4)  ) Recent Results (from the past 240 hour(s))  Blood culture (routine x 2)     Status: None (Preliminary result)   Collection Time: 02/10/16 10:00 PM  Result Value Ref Range Status   Specimen Description BLOOD RIGHT ARM  Final   Special Requests BOTTLES DRAWN AEROBIC ONLY 5CC  Final   Culture NO GROWTH < 24 HOURS  Final   Report Status PENDING  Incomplete  Blood culture (routine x 2)     Status: None (Preliminary result)   Collection Time: 02/10/16 10:05 PM  Result Value Ref Range Status   Specimen Description BLOOD RIGHT HAND  Final   Special Requests BOTTLES DRAWN AEROBIC ONLY 5CC  Final   Culture NO GROWTH < 24 HOURS  Final   Report Status PENDING  Incomplete  Urine culture     Status: Abnormal   Collection Time: 02/11/16 12:29 AM  Result Value Ref Range Status   Specimen Description URINE, CATHETERIZED   Final   Special Requests NONE  Final   Culture MULTIPLE SPECIES PRESENT, SUGGEST RECOLLECTION (A)  Final   Report Status 02/12/2016 FINAL  Final  Culture, sputum-assessment     Status: None   Collection Time: 02/11/16  2:03 PM  Result Value Ref Range Status   Specimen Description SPUTUM  Final   Special Requests NONE  Final   Sputum evaluation   Final    MICROSCOPIC FINDINGS SUGGEST THAT THIS SPECIMEN IS NOT REPRESENTATIVE OF LOWER RESPIRATORY SECRETIONS. PLEASE RECOLLECT. Gram Stain Report Called to,Read Back By and Verified With: P GERHARD,RN AT [id number] 02/11/16 BY L BENFIELD    Report Status 02/11/2016 FINAL  Final         Radiology Studies: Ct Angio Chest Pe W Or Wo Contrast  02/11/2016  CLINICAL DATA:  Chest pain and shortness of  Breath EXAM: CT ANGIOGRAPHY CHEST WITH CONTRAST TECHNIQUE: Multidetector CT imaging of the chest was performed using the standard protocol during bolus administration of intravenous contrast. Multiplanar CT image reconstructions and MIPs were obtained to evaluate the vascular anatomy. CONTRAST:  100 mL Isovue 370 COMPARISON:  02/10/2016, CT from 10/11/2014 FINDINGS: Lungs are well aerated bilaterally and demonstrate emphysematous changes. There is a 2.3 cm partially cavitary nodule within the left upper lobe. This was not present on the prior CTA nor was present on a recent plain film from 01/07/2016. This is consistent with an infectious etiology. Scarring in the right upper lobe is again noted and stable. Stable pleural calcifications are noted along the right hemidiaphragm. No hilar or mediastinal lymphadenopathy is identified. The pulmonary artery and thoracic aorta as visualized are within normal limits. No pulmonary emboli are seen. Mild mucous plugging is noted within the lower lobes bilaterally. Visualized upper abdomen is within normal limits. The bony structures show no acute abnormality. Review of the MIP images confirms the above findings. IMPRESSION: 2.3 cm  nodule in the left upper lobe likely related to an infectious etiology as it was not seen 2 months previous. Followup CT after appropriate therapy is recommended. Scarring in the right upper lobe. COPD. No evidence of pulmonary emboli. Electronically Signed   By: Inez Catalina M.D.   On: 02/11/2016 07:14   Dg Chest Portable 1 View  02/10/2016  CLINICAL DATA:  Dyspnea and chest tightness. EXAM: PORTABLE CHEST 1 VIEW COMPARISON:  01/07/2016 FINDINGS: There is moderate hyperinflation. Emphysematous changes are probably present. Pulmonary vasculature is normal. No large effusion. Focal opacity in the left upper lobe may represent early infectious infiltrate or hemorrhage superimposed on COPD. The lungs are otherwise clear. IMPRESSION: Ill-defined airspace opacity in the left upper lobe, possibly infectious infiltrate or hemorrhage superimposed on COPD. No other acute findings. Followup PA and lateral chest X-ray is recommended in 3-4 weeks following trial of antibiotic therapy to ensure resolution and exclude underlying malignancy. Electronically Signed   By: Andreas Newport M.D.   On: 02/10/2016 22:01        Scheduled Meds: . amLODipine  10 mg Oral Daily  . aspirin EC  81 mg Oral Daily  . atorvastatin  10 mg Oral q1800  . ceFEPime (MAXIPIME) IV  2 g Intravenous Q24H  . enoxaparin (LOVENOX) injection  40 mg Subcutaneous Q24H  . feeding supplement (ENSURE ENLIVE)  237 mL Oral TID BM  . ipratropium-albuterol  3 mL Nebulization Q6H  . isosorbide mononitrate  60 mg Oral Daily  . methylPREDNISolone (SOLU-MEDROL) injection  60 mg Intravenous Q6H  . metoprolol succinate  25 mg Oral Daily  . mometasone-formoterol  2 puff Inhalation BID  . pantoprazole  40 mg Oral Daily  . spironolactone  25 mg Oral Daily  . tiotropium  18 mcg Inhalation Daily  . torsemide  20 mg Oral Daily  . vancomycin  1,000 mg Intravenous Q24H   Continuous Infusions:    LOS: 1 day    Time spent: 9min    Domenic Polite,  MD [facility] Pager [phone]  If 7PM-7AM, please contact night-coverage [email or web address] Password [email or web address] 02/12/2016, 11:14 AM

## 2016-02-13 LAB — CBC
HEMATOCRIT: 24.6 % — AB (ref 39.0–52.0)
Hemoglobin: 8 g/dL — ABNORMAL LOW (ref 13.0–17.0)
MCH: 28.2 pg (ref 26.0–34.0)
MCHC: 32.5 g/dL (ref 30.0–36.0)
MCV: 86.6 fL (ref 78.0–100.0)
Platelets: 363 10*3/uL (ref 150–400)
RBC: 2.84 MIL/uL — AB (ref 4.22–5.81)
RDW: 14.5 % (ref 11.5–15.5)
WBC: 17.2 10*3/uL — AB (ref 4.0–10.5)

## 2016-02-13 LAB — BASIC METABOLIC PANEL
ANION GAP: 10 (ref 5–15)
BUN: 29 mg/dL — ABNORMAL HIGH (ref 6–20)
CO2: 31 mmol/L (ref 22–32)
Calcium: 8.5 mg/dL — ABNORMAL LOW (ref 8.9–10.3)
Chloride: 93 mmol/L — ABNORMAL LOW (ref 101–111)
Creatinine, Ser: 1.34 mg/dL — ABNORMAL HIGH (ref 0.61–1.24)
GFR, EST AFRICAN AMERICAN: 55 mL/min — AB (ref >60)
GFR, EST NON AFRICAN AMERICAN: 47 mL/min — AB (ref >60)
GLUCOSE: 248 mg/dL — AB (ref 65–99)
POTASSIUM: 3.7 mmol/L (ref 3.5–5.1)
Sodium: 134 mmol/L — ABNORMAL LOW (ref 135–145)

## 2016-02-13 MED ORDER — METOPROLOL SUCCINATE ER 25 MG PO TB24
12.5000 mg | ORAL_TABLET | Freq: Every day | ORAL | Status: DC
  Administered 2016-02-14 – 2016-02-16 (×3): 12.5 mg via ORAL
  Filled 2016-02-13 (×3): qty 1

## 2016-02-13 MED ORDER — GUAIFENESIN ER 600 MG PO TB12
600.0000 mg | ORAL_TABLET | Freq: Two times a day (BID) | ORAL | Status: DC
  Administered 2016-02-13 – 2016-02-16 (×7): 600 mg via ORAL
  Filled 2016-02-13 (×7): qty 1

## 2016-02-13 NOTE — Evaluation (Signed)
Physical Therapy Evaluation Patient Details Name: Cristian Collins MRN: [id number] DOB: June 21, 1930 Today's Date: 02/13/2016   History of Present Illness  82 y.o. male admitted wth worsening SOB.  Chest x-ray showed Lt sided infiltrates concerning for PNA.  Dx:  PNA/infectious nodule, COPD exacerbation, DAC, chronic diastolic CHF, HTN  Clinical Impression  Patient presents with decreased mobility due to limitations listed in PT problem list below.  He will benefit from skilled PT in the acute setting to allow return home with family support and follow up HHPT (already had set up and active prior to admission).    Follow Up Recommendations Home health PT    Equipment Recommendations  None recommended by PT    Recommendations for Other Services       Precautions / Restrictions Precautions Precautions: Fall Precaution Comments: reports 2-3 falls in past month and a half at home.       Mobility  Bed Mobility               General bed mobility comments: up in chair  Transfers Overall transfer level: Needs assistance   Transfers: Sit to/from Stand;Stand Pivot Transfers Sit to Stand: Supervision Stand pivot transfers: Supervision       General transfer comment: assist for safety  Ambulation/Gait   Ambulation Distance (Feet): 200 Feet Assistive device: Straight cane Gait Pattern/deviations: Step-through pattern;Decreased stride length;Shuffle;Narrow base of support     General Gait Details: very slow and with short shuffling steps   Stairs            Wheelchair Mobility    Modified Rankin (Stroke Patients Only)       Balance Overall balance assessment: Needs assistance Sitting-balance support: No upper extremity supported Sitting balance-Leahy Scale: Good     Standing balance support: During functional activity;Single extremity supported Standing balance-Leahy Scale: Fair                               Pertinent Vitals/Pain Pain  Assessment: No/denies pain    Home Living Family/patient expects to be discharged to:: Private residence Living Arrangements: Spouse/significant other;Children Available Help at Discharge: Family;Available 24 hours/day Type of Home: House Home Access: Stairs to enter Entrance Stairs-Rails: None Entrance Stairs-Number of Steps: 1 (half step) and a threshold Home Layout: One level Home Equipment: Walker - 2 wheels;Cane - single point;Bedside commode;Cane - quad;Grab bars - tub/shower Additional Comments: Pt reports he sponge bathes. History of falls at home.    Prior Function Level of Independence: Needs assistance   Gait / Transfers Assistance Needed: Uses cane more than RW.  RW at times outside of home.  ADL's / Homemaking Assistance Needed: Reports wife helps him bathe        Hand Dominance   Dominant Hand: Right    Extremity/Trunk Assessment   Upper Extremity Assessment: Defer to OT evaluation           Lower Extremity Assessment: Generalized weakness      Cervical / Trunk Assessment: Normal  Communication   Communication: No difficulties  Cognition Arousal/Alertness: Awake/alert Behavior During Therapy: WFL for tasks assessed/performed Overall Cognitive Status: Within Functional Limits for tasks assessed                      General Comments General comments (skin integrity, edema, etc.): on 2L O2 SpO2 100% with ambulation    Exercises        Assessment/Plan  PT Assessment Patient needs continued PT services  PT Diagnosis Generalized weakness;Abnormality of gait   PT Problem List Decreased strength;Decreased balance;Decreased activity tolerance;Decreased mobility  PT Treatment Interventions DME instruction;Gait training;Functional mobility training;Patient/family education;Therapeutic activities;Therapeutic exercise;Balance training   PT Goals (Current goals can be found in the Care Plan section) Acute Rehab PT Goals Patient Stated Goal: to  get better and go home     Frequency Min 3X/week   Barriers to discharge        Co-evaluation               End of Session Equipment Utilized During Treatment: Gait belt;Oxygen Activity Tolerance: Patient tolerated treatment well Patient left: in chair;with call bell/phone within reach;with chair alarm set           Time: [id number] PT Time Calculation (min) (ACUTE ONLY): 34 min   Charges:   PT Evaluation $PT Eval Moderate Complexity: 1 Procedure PT Treatments $Gait Training: 8-22 mins   PT G Codes:        [name] 03-08-2016, 1:21 PM  [name], [place] 08-Mar-2016

## 2016-02-13 NOTE — Progress Notes (Signed)
Up in chair most of day. Not requiring any increase in O2. Coughing up white sputum, using I/S often.
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
## 2016-02-13 NOTE — Progress Notes (Signed)
Speech Language Pathology Treatment: Dysphagia  Patient Details Name: Cristian Collins MRN: [id number] DOB: 07/11/1930 Today's Date: 02/13/2016 Time: 1500-1509 SLP Time Calculation (min) (ACUTE ONLY): 9 min  Assessment / Plan / Recommendation Clinical Impression  Skilled treatment session focused on addressing dysphagia goals. SLP facilitated session by providing trails of regular, soft solids with thin liquids, which patient consumed independently with cough x1.  Cough likely not related to PO given results of MBS.  Patient able to demonstrate small bites and sips as well as recall needing to eat out of bed.  Goals met, education complete and SLP will sign off.     HPI HPI: 85/M with COPD stage C COPD on 2-3L Home O2 with recurrent pneumonia/infectious nodule noted on CTA in left upper lobe, in May had RLL pneumonia, also history suggestive of dysphagia. Chest x-ray shows left-sided infiltrates concerning for pneumonia. H/o GERD and hiatal hernia.       SLP Plan  All goals met     Recommendations  Diet recommendations: Regular;Thin liquid Liquids provided via: Straw;Cup Medication Administration: Whole meds with liquid Supervision: Patient able to self feed Compensations: Slow rate;Small sips/bites Postural Changes and/or Swallow Maneuvers: Out of bed for meals             Oral Care Recommendations: Oral care BID Follow up Recommendations: None Plan: All goals met     GO              Carmelia Roller., CCC-SLP [phone]   Keng Jewel 02/13/2016, 3:33 PM

## 2016-02-13 NOTE — Evaluation (Signed)
Occupational Therapy Evaluation Patient Details Name: Cristian Collins MRN: [id number] DOB: [birth year] Today's Date: 02/13/2016    History of Present Illness 82 y.o. male admitted wth worsening SOB.  Chest x-ray showed Lt sided infiltrates concerning for PNA.  Dx:  PNA/infectious nodule, COPD exacerbation, DAC, chronic diastolic CHF, HTN   Clinical Impression   Pt admitted with above. He demonstrates the below listed deficits and will benefit from continued OT to maximize safety and independence with BADLs.  Pt presents to OT with generalized weakness, and decreased activity tolerance.  He is able to perform ADLs with supervision and DOE 2/4 - 3/4.  Will see acutely for HEP and energy conservation education.       Follow Up Recommendations  No OT follow up;Supervision/Assistance - 24 hour    Equipment Recommendations  None recommended by OT    Recommendations for Other Services       Precautions / Restrictions Precautions Precautions: Fall      Mobility Bed Mobility                  Transfers Overall transfer level: Needs assistance   Transfers: Sit to/from Stand;Stand Pivot Transfers Sit to Stand: Supervision Stand pivot transfers: Supervision            Balance Overall balance assessment: History of Falls;Needs assistance Sitting-balance support: Feet supported Sitting balance-Leahy Scale: Good     Standing balance support: During functional activity;Single extremity supported Standing balance-Leahy Scale: Fair                              ADL Overall ADL's : Needs assistance/impaired Eating/Feeding: Independent   Grooming: Wash/dry hands;Wash/dry face;Oral care;Brushing hair;Supervision/safety;Standing   Upper Body Bathing: Set up;Sitting   Lower Body Bathing: Supervison/ safety;Sit to/from stand   Upper Body Dressing : Set up;Sitting   Lower Body Dressing: Supervision/safety;Sit to/from stand   Toilet Transfer:  Supervision/safety;Ambulation;Comfort height toilet;Grab bars   Toileting- Clothing Manipulation and Hygiene: Supervision/safety;Sit to/from stand       Functional mobility during ADLs: Supervision/safety;Cane;Min guard General ADL Comments: Pt is very motivated with good safety awareness      Vision     Perception     Praxis      Pertinent Vitals/Pain Pain Assessment: No/denies pain     Hand Dominance Right   Extremity/Trunk Assessment Upper Extremity Assessment Upper Extremity Assessment: Overall WFL for tasks assessed   Lower Extremity Assessment Lower Extremity Assessment: Defer to PT evaluation   Cervical / Trunk Assessment Cervical / Trunk Assessment: Normal   Communication Communication Communication: No difficulties   Cognition Arousal/Alertness: Awake/alert Behavior During Therapy: WFL for tasks assessed/performed Overall Cognitive Status: Within Functional Limits for tasks assessed                     General Comments       Exercises       Shoulder Instructions      Home Living Family/patient expects to be discharged to:: Private residence Living Arrangements: Spouse/significant other;Children Available Help at Discharge: Family;Available 24 hours/day Type of Home: House Home Access: Stairs to enter [employer] of Steps: 1 + 2 no rails Entrance Stairs-Rails: None Home Layout: One level     Bathroom Shower/Tub: Tub/shower unit         Home Equipment: Environmental consultant - 2 wheels;Cane - single point;Bedside commode;Shower seat   Additional Comments: Pt reports he sponge bathes. History of falls at  home.      Prior Functioning/Environment Level of Independence: Needs assistance  Gait / Transfers Assistance Needed: Uses cane more than RW.  RW at times outside of home. ADL's / Homemaking Assistance Needed: Pt reports he performs ADLs independently most of the time without difficulty.  Family assist with IADLs         OT  Diagnosis: Generalized weakness   OT Problem List: Decreased strength;Decreased activity tolerance;Cardiopulmonary status limiting activity   OT Treatment/Interventions: Self-care/ADL training;Therapeutic exercise;Energy conservation;Therapeutic activities;Patient/family education    OT Goals(Current goals can be found in the care plan section) Acute Rehab OT Goals Patient Stated Goal: to get better and go home  OT Goal Formulation: With patient Time For Goal Achievement: 02/27/16 Potential to Achieve Goals: Good ADL Goals Pt/caregiver will Perform Home Exercise Program: Increased strength;Right Upper extremity;Left upper extremity;With written HEP provided;With theraband;Independently Additional ADL Goal #1: Pt will be independent with energy conservation techniques   OT Frequency: Min 2X/week   Barriers to D/C:            Co-evaluation              End of Session Equipment Utilized During Treatment: Oxygen Nurse Communication: Mobility status  Activity Tolerance: Patient tolerated treatment well Patient left: in chair;with call bell/phone within reach;with chair alarm set   Time: [id number] OT Time Calculation (min): 27 min Charges:  OT General Charges $OT Visit: 1 Procedure OT Evaluation $OT Eval Low Complexity: 1 Procedure OT Treatments $Therapeutic Activity: 8-22 mins G-Codes:    Rosalin Buster M March 10, 2016, 1:10 PM

## 2016-02-14 ENCOUNTER — Encounter (HOSPITAL_COMMUNITY): Payer: Self-pay | Admitting: General Practice

## 2016-02-14 ENCOUNTER — Other Ambulatory Visit: Payer: Self-pay

## 2016-02-14 DIAGNOSIS — I2510 Atherosclerotic heart disease of native coronary artery without angina pectoris: Secondary | ICD-10-CM

## 2016-02-14 DIAGNOSIS — J189 Pneumonia, unspecified organism: Secondary | ICD-10-CM

## 2016-02-14 DIAGNOSIS — D649 Anemia, unspecified: Secondary | ICD-10-CM

## 2016-02-14 DIAGNOSIS — I10 Essential (primary) hypertension: Secondary | ICD-10-CM

## 2016-02-14 DIAGNOSIS — J449 Chronic obstructive pulmonary disease, unspecified: Secondary | ICD-10-CM

## 2016-02-14 MED ORDER — POTASSIUM CHLORIDE CRYS ER 20 MEQ PO TBCR
40.0000 meq | EXTENDED_RELEASE_TABLET | Freq: Once | ORAL | Status: AC
  Administered 2016-02-14: 40 meq via ORAL
  Filled 2016-02-14: qty 2

## 2016-02-14 MED ORDER — IPRATROPIUM-ALBUTEROL 0.5-2.5 (3) MG/3ML IN SOLN
3.0000 mL | Freq: Three times a day (TID) | RESPIRATORY_TRACT | Status: DC
  Administered 2016-02-14 – 2016-02-16 (×7): 3 mL via RESPIRATORY_TRACT
  Filled 2016-02-14 (×6): qty 3

## 2016-02-14 MED ORDER — DEXTROSE 5 % IV SOLN
1.0000 g | INTRAVENOUS | Status: DC
  Filled 2016-02-14: qty 1

## 2016-02-14 MED ORDER — LEVOFLOXACIN 750 MG PO TABS
750.0000 mg | ORAL_TABLET | ORAL | Status: DC
  Administered 2016-02-14: 750 mg via ORAL
  Filled 2016-02-14: qty 1

## 2016-02-14 MED ORDER — SODIUM CHLORIDE 0.9 % IV SOLN
INTRAVENOUS | Status: DC
  Administered 2016-02-14: 16:00:00 via INTRAVENOUS

## 2016-02-14 NOTE — Progress Notes (Signed)
PROGRESS NOTE    SAVIR ERDMANN  [id number] DOB: Jan 03, 1930 DOA: 02/10/2016 PCP: [name], MD  Brief Narrative: Cristian Collins is a 82 y.o. male with CAD status post stenting, CHF, COPD, chronic anemia presents to the ER because of worsening shortness of breath. Patient states at baseline patient he is short of breath all the time but last 24 hours he had acute worsening of shortness of breath with productive cough. Patient is also mildly febrile. Chest x-ray shows left-sided infiltrates concerning for pneumonia. Patient was admitted about 30 days ago for pneumonia. Patient also has been having some pleuritic-type of chest pain mostly on the right side. EKG was unremarkable and troponins are negative   Assessment & Plan:  Recurrent Pneumonia/infectious nodule  -infectious nodule noted on CTA in left upper lobe, in May had RLL pneumonia  -history suggestive of dysphagia, SLP eval completed only mild dysphagia, regular diet recommended  -Initially was on vancomycin and cefepime, currently on cefepime only. Will discontinue and start levofloxacin.  -due to weight loss and failure to thrive, needs Pulm FU for eval for Bronch etc-not stable for this now due to resp distress   -followed by Dr.Wert, made FU for 7/11  COPD exacerbation -improving but very slowly -continue IV steroids, abx, nebs -chronic resp failure on 2-3L home O2 -I will cut down his Metoprolol dose due to severity of COPD  CAD status post stenting/chronic diastolic CHF -troponin [id number], no symptoms of ACS -continue ASA/Toprol/statin  -Hold torsemide and Aldactone for today, creatinine increase from 0.9 to 1.3.  Chronic anemia  -stable, chronic  Hypertension  - continue amlodipine and beta blockers.  Acute kidney injury -Baseline creatinine 0.9, creatinine increase to 1.34 yesterday, will hold torsemide and metoprolol. -Give trickle IV fluids at 75 mL/hour for the next 12 hours. Check BMP in a.m.  DVT  prophylaxis: Lovenox. Code Status: Full code.  Family Communication: No family at the bedside.  Disposition Plan: Home ?2days   Antimicrobials:Rocephin/zithromax  Subjective: breathing starting to improve  Objective: Filed Vitals:   02/14/16 0139 02/14/16 0427 02/14/16 0835 02/14/16 1208  BP:  137/49  149/44  Pulse:  66  83  Temp:  98.3 F (36.8 C)  97.6 F (36.4 C)  TempSrc:  Oral  Oral  Resp:  20  22  Height:      Weight:  43.772 kg (96 lb 8 oz)    SpO2: 100% 100% 99% 100%    Intake/Output Summary (Last 24 hours) at 02/14/16 1300 Last data filed at 02/14/16 1109  Gross per 24 hour  Intake    720 ml  Output   1875 ml  Net  -1155 ml   Filed Weights   02/12/16 0654 02/13/16 0506 02/14/16 0427  Weight: 42.321 kg (93 lb 4.8 oz) 42.91 kg (94 lb 9.6 oz) 43.772 kg (96 lb 8 oz)    Examination:  General exam: Frail thinly built male  Respiratory system: improved air movement, still with wheezes Cardiovascular system: S1 & S2 heard, RRR. No JVD, murmurs, rubs, gallops or clicks. No pedal edema. Gastrointestinal system: Abdomen is nondistended, soft and nontender. No organomegaly or masses felt. Normal bowel sounds  Central nervous system: Alert and oriented. No focal neurological deficits. Extremities: Symmetric 5 x 5 power. Skin: No rashes, lesions or ulcers Psychiatry: Judgement and insight appear normal. Mood & affect appropriate.     Data Reviewed: I have personally reviewed following labs and imaging studies  CBC:  Recent Labs Lab 02/10/16 2205  02/11/16 0445 02/12/16 0828 02/13/16 0241  WBC 13.4* 13.2* 11.7* 17.2*  NEUTROABS 11.3* 10.3*  --   --   HGB 8.9* 9.3* 9.2* 8.0*  HCT 27.5* 29.2* 28.7* 24.6*  MCV 87.3 89.3 87.8 86.6  PLT 333 640* 378 [id number]   Basic Metabolic Panel:  Recent Labs Lab 02/10/16 2205 02/11/16 0445 02/12/16 0828 02/13/16 0241  NA 137 138 135 134*  K 3.7 4.0 3.8 3.7  CL 102 100* 95* 93*  CO2 27 26 31 31   GLUCOSE 119* 92 225*  248*  BUN 8 8 13  29*  CREATININE 0.81 0.76 0.99 1.34*  CALCIUM 8.8* 8.8* 9.0 8.5*   GFR: Estimated Creatinine Clearance: 25.9 mL/min (by C-G formula based on Cr of 1.34). Liver Function Tests:  Recent Labs Lab 02/10/16 2205 02/11/16 0445  AST 24 29  ALT 16* 18  ALKPHOS 75 80  BILITOT 0.4 0.6  PROT 5.4* 6.1*  ALBUMIN 2.6* 2.7*    Recent Labs Lab 02/10/16 2205  LIPASE 12   No results for input(s): AMMONIA in the last 168 hours. Coagulation Profile: No results for input(s): INR, PROTIME in the last 168 hours. Cardiac Enzymes:  Recent Labs Lab 02/11/16 0708 02/11/16 1355  TROPONINI 0.05* 0.04*   BNP (last 3 results) No results for input(s): PROBNP in the last 8760 hours. HbA1C: No results for input(s): HGBA1C in the last 72 hours. CBG:  Recent Labs Lab 02/11/16 1127  GLUCAP 100*   Lipid Profile: No results for input(s): CHOL, HDL, LDLCALC, TRIG, CHOLHDL, LDLDIRECT in the last 72 hours. Thyroid Function Tests: No results for input(s): TSH, T4TOTAL, FREET4, T3FREE, THYROIDAB in the last 72 hours. Anemia Panel: No results for input(s): VITAMINB12, FOLATE, FERRITIN, TIBC, IRON, RETICCTPCT in the last 72 hours. Urine analysis:    Component Value Date/Time   COLORURINE YELLOW 02/11/2016 0029   APPEARANCEUR CLEAR 02/11/2016 0029   LABSPEC 1.015 02/11/2016 0029   PHURINE 5.0 02/11/2016 0029   GLUCOSEU NEGATIVE 02/11/2016 0029   HGBUR NEGATIVE 02/11/2016 0029   BILIRUBINUR NEGATIVE 02/11/2016 0029   KETONESUR NEGATIVE 02/11/2016 0029   PROTEINUR 30* 02/11/2016 0029   UROBILINOGEN 0.2 10/08/2014 0838   NITRITE NEGATIVE 02/11/2016 0029   LEUKOCYTESUR NEGATIVE 02/11/2016 0029   Sepsis Labs: @LABRCNTIP (procalcitonin:4,lacticidven:4)  ) Recent Results (from the past 240 hour(s))  Blood culture (routine x 2)     Status: None (Preliminary result)   Collection Time: 02/10/16 10:00 PM  Result Value Ref Range Status   Specimen Description BLOOD RIGHT ARM  Final     Special Requests BOTTLES DRAWN AEROBIC ONLY 5CC  Final   Culture NO GROWTH 3 DAYS  Final   Report Status PENDING  Incomplete  Blood culture (routine x 2)     Status: None (Preliminary result)   Collection Time: 02/10/16 10:05 PM  Result Value Ref Range Status   Specimen Description BLOOD RIGHT HAND  Final   Special Requests BOTTLES DRAWN AEROBIC ONLY 5CC  Final   Culture NO GROWTH 3 DAYS  Final   Report Status PENDING  Incomplete  Urine culture     Status: Abnormal   Collection Time: 02/11/16 12:29 AM  Result Value Ref Range Status   Specimen Description URINE, CATHETERIZED  Final   Special Requests NONE  Final   Culture MULTIPLE SPECIES PRESENT, SUGGEST RECOLLECTION (A)  Final   Report Status 02/12/2016 FINAL  Final  Culture, sputum-assessment     Status: None   Collection Time: 02/11/16  2:03 PM  Result  Value Ref Range Status   Specimen Description SPUTUM  Final   Special Requests NONE  Final   Sputum evaluation   Final    MICROSCOPIC FINDINGS SUGGEST THAT THIS SPECIMEN IS NOT REPRESENTATIVE OF LOWER RESPIRATORY SECRETIONS. PLEASE RECOLLECT. Gram Stain Report Called to,Read Back By and Verified With: P GERHARD,RN AT 1510 02/11/16 BY L BENFIELD    Report Status 02/11/2016 FINAL  Final         Radiology Studies: No results found.      Scheduled Meds: . amLODipine  10 mg Oral Daily  . aspirin EC  81 mg Oral Daily  . atorvastatin  10 mg Oral q1800  . ceFEPime (MAXIPIME) IV  1 g Intravenous Q24H  . enoxaparin (LOVENOX) injection  30 mg Subcutaneous Q24H  . feeding supplement (ENSURE ENLIVE)  237 mL Oral TID BM  . guaiFENesin  600 mg Oral BID  . ipratropium-albuterol  3 mL Nebulization TID  . isosorbide mononitrate  60 mg Oral Daily  . methylPREDNISolone (SOLU-MEDROL) injection  60 mg Intravenous Q6H  . metoprolol succinate  12.5 mg Oral Daily  . mometasone-formoterol  2 puff Inhalation BID  . pantoprazole  40 mg Oral Daily  . spironolactone  25 mg Oral Daily  .  tiotropium  18 mcg Inhalation Daily  . torsemide  20 mg Oral Daily   Continuous Infusions:    LOS: 3 days    Time spent: 81min    Domenic Polite, MD [facility] Pager [phone]  If 7PM-7AM, please contact night-coverage [email or web address] Password [email or web address] 02/14/2016, 1:00 PM

## 2016-02-14 NOTE — Progress Notes (Signed)
Pharmacy Antibiotic Note Cristian Collins is a 82 y.o. male admitted on 02/10/2016 with pneumonia.  Currently on day 3 of Cefepime for treatment.   SCr 1.34 < 0.99, CrCl < 30 ml/min  Plan: 1. Adjust Cefepime to 1g IV every 24 hours 2. Noted plans for transition to PO abx soon  Height: 5\' 6"  (167.6 cm) Weight: 96 lb 8 oz (43.772 kg) (scale c) IBW/kg (Calculated) : 63.8  Temp (24hrs), Avg:97.8 F (36.6 C), Min:97.3 F (36.3 C), Max:98.3 F (36.8 C)   Recent Labs Lab 02/10/16 2205 02/11/16 0105 02/11/16 0114 02/11/16 0445 02/12/16 0828 02/13/16 0241  WBC 13.4*  --   --  13.2* 11.7* 17.2*  CREATININE 0.81  --   --  0.76 0.99 1.34*  LATICACIDVEN 0.8 0.7 0.50  --   --   --     Estimated Creatinine Clearance: 25.9 mL/min (by C-G formula based on Cr of 1.34).    No Known Allergies  Antibiotics received this admission  7/2 Cefepime >> 7/2 Vancomycin x 1 dose   Culture data: 7/1 BCx: ngtd  7/1 UCx: recollect  7/1 Sputum: recollect   Thank you for allowing pharmacy to be a part of this patient's care.  Vincenza Hews, PharmD, BCPS 02/14/2016, 9:51 AM Pager: [phone]

## 2016-02-14 NOTE — Care Management Note (Signed)
Case Management Note  Patient Details  Name: Cristian Collins MRN: [id number] Date of Birth: February 22, 1930  Subjective/Objective:     Admitted with Pneumonia               Action/Plan: Patient lives at home with spouse and 2 sons; Active with [facility] for [facility] / PT Disease Management for CHF; he has home oxygen, 4 canes and 3 walkers at home, PCP is Dr Baird Cancer; private insurance with [facility] with prescription drug coverage; pharmacy of choice is [facility]; patient reports no problem getting his medication; Patient is also followed by [facility]  Expected Discharge Date:  Possibly 02/15/2016             Expected Discharge Plan:  [place]  Discharge planning Services  CM Consult Choice offered to:  Patient HH Arranged:  RN, Disease Management, PT [place] Agency:  [facility]  Status of Service:  In process, will continue to follow  Sherrilyn Rist [id number] 02/14/2016, 1:33 PM

## 2016-02-14 NOTE — Progress Notes (Signed)
Physical Therapy Treatment Patient Details Name: ARMAND WILLEMS MRN: [id number] DOB: 10-Sep-1929 Today's Date: 02/14/2016    History of Present Illness 82 y.o. male admitted wth worsening SOB.  Chest x-ray showed Lt sided infiltrates concerning for PNA.  Dx:  PNA/infectious nodule, COPD exacerbation, DAC, chronic diastolic CHF, HTN    PT Comments    Pt making progress with PT regarding mobility, ambulating 220 ft with SPC and min guard/supervision. Pt reports that he feels confident with going home when he is released. PT to continue to follow and progress as tolerated.   Follow Up Recommendations  Home health PT;Supervision for mobility/OOB     Equipment Recommendations  None recommended by PT    Recommendations for Other Services       Precautions / Restrictions Precautions Precautions: Fall Restrictions Weight Bearing Restrictions: No    Mobility  Bed Mobility Overal bed mobility: Needs Assistance Bed Mobility: Supine to Sit     Supine to sit: Supervision     General bed mobility comments: HOB elevated approx. 20 degrees  Transfers Overall transfer level: Needs assistance Equipment used: Straight cane Transfers: Sit to/from Stand Sit to Stand: Supervision         General transfer comment: supervision for safety  Ambulation/Gait Ambulation/Gait assistance: Min guard Ambulation Distance (Feet): 220 Feet Assistive device: Straight cane Gait Pattern/deviations: Step-through pattern;Narrow base of support;Decreased step length - right;Decreased step length - left     General Gait Details: slow pattern,no loss of balance   Stairs            Wheelchair Mobility    Modified Rankin (Stroke Patients Only)       Balance Overall balance assessment: Needs assistance Sitting-balance support: No upper extremity supported Sitting balance-Leahy Scale: Good     Standing balance support: During functional activity Standing balance-Leahy Scale: Fair                       Cognition Arousal/Alertness: Awake/alert Behavior During Therapy: WFL for tasks assessed/performed Overall Cognitive Status: Within Functional Limits for tasks assessed                      Exercises      General Comments General comments (skin integrity, edema, etc.): SpO2 100% prior and after ambulation on 3L O2      Pertinent Vitals/Pain Pain Assessment: No/denies pain    Home Living                      Prior Function            PT Goals (current goals can now be found in the care plan section) Acute Rehab PT Goals Patient Stated Goal: be able to get back home Progress towards PT goals: Progressing toward goals    Frequency  Min 3X/week    PT Plan Current plan remains appropriate    Co-evaluation             End of Session Equipment Utilized During Treatment: Gait belt;Oxygen Activity Tolerance: Patient tolerated treatment well Patient left: in chair;with call bell/phone within reach;with chair alarm set     Time: [id number] PT Time Calculation (min) (ACUTE ONLY): 21 min  Charges:  $Gait Training: 8-22 mins                    G Codes:      Cassell Clement, PT, CSCS Pager [place]  [phone]  02/14/2016, 9:21 AM

## 2016-02-15 ENCOUNTER — Ambulatory Visit: Payer: Medicare Other

## 2016-02-15 LAB — BASIC METABOLIC PANEL
ANION GAP: 9 (ref 5–15)
BUN: 30 mg/dL — ABNORMAL HIGH (ref 6–20)
CALCIUM: 8.4 mg/dL — AB (ref 8.9–10.3)
CHLORIDE: 93 mmol/L — AB (ref 101–111)
CO2: 31 mmol/L (ref 22–32)
Creatinine, Ser: 1.31 mg/dL — ABNORMAL HIGH (ref 0.61–1.24)
GFR calc Af Amer: 56 mL/min — ABNORMAL LOW (ref >60)
GFR calc non Af Amer: 49 mL/min — ABNORMAL LOW (ref >60)
GLUCOSE: 132 mg/dL — AB (ref 65–99)
Potassium: 4.1 mmol/L (ref 3.5–5.1)
Sodium: 133 mmol/L — ABNORMAL LOW (ref 135–145)

## 2016-02-15 LAB — CULTURE, BLOOD (ROUTINE X 2)
CULTURE: NO GROWTH
CULTURE: NO GROWTH

## 2016-02-15 LAB — CBC
HCT: 26.1 % — ABNORMAL LOW (ref 39.0–52.0)
Hemoglobin: 8.8 g/dL — ABNORMAL LOW (ref 13.0–17.0)
MCH: 28.7 pg (ref 26.0–34.0)
MCHC: 33.7 g/dL (ref 30.0–36.0)
MCV: 85 fL (ref 78.0–100.0)
Platelets: 337 10*3/uL (ref 150–400)
RBC: 3.07 MIL/uL — AB (ref 4.22–5.81)
RDW: 14.5 % (ref 11.5–15.5)
WBC: 11.3 10*3/uL — AB (ref 4.0–10.5)

## 2016-02-15 MED ORDER — LEVOFLOXACIN 500 MG PO TABS: 500.0000 mg | ORAL_TABLET | ORAL | Status: DC

## 2016-02-15 NOTE — Progress Notes (Signed)
Physical Therapy Treatment Patient Details Name: Cristian Collins MRN: [id number] DOB: [birth year] Today's Date: 02/15/2016    History of Present Illness 82 y.o. male admitted wth worsening SOB.  Chest x-ray showed Lt sided infiltrates concerning for PNA.  Dx:  PNA/infectious nodule, COPD exacerbation, DAC, chronic diastolic CHF, HTN    PT Comments    Cristian Collins continues to make good progress and focused today on balance activities during both static and dynamic activity.  Mild balance impairments noted w/ dynamic activity.  Pt will benefit from continued skilled PT services to increase functional independence and safety.   Follow Up Recommendations  Home health PT;Supervision for mobility/OOB     Equipment Recommendations  None recommended by PT    Recommendations for Other Services       Precautions / Restrictions Precautions Precautions: Fall Restrictions Weight Bearing Restrictions: No    Mobility  Bed Mobility               General bed mobility comments: Pt sitting in recliner chair upon PT arrival  Transfers Overall transfer level: Needs assistance Equipment used: Straight cane Transfers: Sit to/from Stand Sit to Stand: Supervision         General transfer comment: supervision for safety  Ambulation/Gait Ambulation/Gait assistance: Min guard Ambulation Distance (Feet): 250 Feet Assistive device: Straight cane Gait Pattern/deviations: Step-through pattern;Decreased stride length Gait velocity: decreased   General Gait Details: Pt able to tolerate high level balance activities w/ min instability as documented below.  SpO2 remains at or above 91% while ambulating.  Min guard for safety.   Stairs            Wheelchair Mobility    Modified Rankin (Stroke Patients Only)       Balance Overall balance assessment: Needs assistance Sitting-balance support: No upper extremity supported;Feet supported Sitting balance-Leahy Scale: Good Sitting  balance - Comments: Pt performing theraband exercises seated EOB with no support    Standing balance support: Single extremity supported;During functional activity Standing balance-Leahy Scale: Fair Standing balance comment: Pt tends to try holding onto furntiure during dynamic standing tasks/activities          [place] - Eyes Opened: 30 (without UEs supported) [place] - Eyes Closed: 30 (without UEs supported) High level balance activites: Backward walking;Direction changes;Turns;Sudden stops;Head turns;Other (comment) (Stepping over object) High Level Balance Comments: Mild instability noted w/ stepping over objects and with head turns    Cognition Arousal/Alertness: Awake/alert Behavior During Therapy: WFL for tasks assessed/performed Overall Cognitive Status: Within Functional Limits for tasks assessed                      Exercises General Exercises - Lower Extremity Ankle Circles/Pumps: AROM;Both;10 reps;Seated Long Arc Quad: Both;10 reps;Seated Straight Leg Raises: Both;5 reps;Seated Hip Flexion/Marching: Both;10 reps;Standing (without UEs supported) Mini-Sqauts: Strengthening;10 reps;Standing    General Comments        Pertinent Vitals/Pain Pain Assessment: No/denies pain (pt reports recently taking pain meds to decrease pain)    Home Living                      Prior Function            PT Goals (current goals can now be found in the care plan section) Acute Rehab PT Goals Patient Stated Goal: be able to get back home Progress towards PT goals: Progressing toward goals    Frequency  Min 3X/week    PT Plan Current plan  remains appropriate    Co-evaluation             End of Session Equipment Utilized During Treatment: Gait belt;Oxygen Activity Tolerance: Patient tolerated treatment well Patient left: in chair;with call bell/phone within reach;with chair alarm set     Time: [id number] PT Time Calculation (min) (ACUTE ONLY): 23  min  Charges:  $Gait Training: 8-22 mins $Therapeutic Exercise: 8-22 mins                    G Codes:      Cristian Collins PT, DPT  Pager: [phone] Phone: [phone] 02/15/2016, 2:07 PM

## 2016-02-15 NOTE — Care Management Important Message (Signed)
Important Message  Patient Details  Name: Cristian Collins MRN: [id number] Date of Birth: 1930/01/18   Medicare Important Message Given:  Yes    [name], [place] 02/15/2016, 8:59 AM

## 2016-02-15 NOTE — Progress Notes (Signed)
PROGRESS NOTE    Cristian Collins  [id number] DOB: 07-Mar-1930 DOA: 02/10/2016 PCP: [name], MD  Brief Narrative: Cristian Collins is a 82 y.o. male with CAD status post stenting, CHF, COPD, chronic anemia presents to the ER because of worsening shortness of breath. Patient states at baseline patient he is short of breath all the time but last 24 hours he had acute worsening of shortness of breath with productive cough. Patient is also mildly febrile. Chest x-ray shows left-sided infiltrates concerning for pneumonia. Patient was admitted about 30 days ago for pneumonia. Patient also has been having some pleuritic-type of chest pain mostly on the right side. EKG was unremarkable and troponins are negative   Subjective: Still feels weak, continued to have cough and problems with breathing.   Assessment & Plan:  Recurrent Pneumonia/infectious nodule  -infectious nodule noted on CTA in left upper lobe, in May had RLL pneumonia  -history suggestive of dysphagia, SLP eval completed only mild dysphagia, regular diet recommended  -Initially was on vancomycin and cefepime, currently on cefepime only. Will discontinue and start levofloxacin.  -due to weight loss and failure to thrive, needs Pulm FU for eval for Bronch etc-not stable for this now due to resp distress   -followed by Dr.Wert, made FU for 7/11 -Was about to discharge today, still feels weak, continue current treatment and expect discharge in a.m.  COPD exacerbation -improving but very slowly -continue IV steroids, abx, nebs -chronic resp failure on 2-3L home O2 -I will cut down his Metoprolol dose due to severity of COPD  CAD status post stenting/chronic diastolic CHF -troponin [id number], no symptoms of ACS -continue ASA/Toprol/statin  -Hold torsemide and Aldactone for today, creatinine increase from 0.9 to 1.3.  Chronic anemia  -stable, chronic  Hypertension  - continue amlodipine and beta blockers.  Acute kidney  injury -Baseline creatinine 0.9, creatinine increase to 1.34 yesterday, will hold torsemide and metoprolol. -Got trickle IV fluids at 75 mL/hour for the next 12 hours. IV fluids discontinued, check BMP in a.m.  DVT prophylaxis: Lovenox. Code Status: Full code.  Family Communication: No family at the bedside.  Disposition Plan: Home ?2days   Antimicrobials:Rocephin/zithromax   Objective: Filed Vitals:   02/14/16 2151 02/15/16 0513 02/15/16 0843 02/15/16 1120  BP: 117/44 134/56  131/46  Pulse: 68 65  71  Temp: 97.8 F (36.6 C) 98.1 F (36.7 C)  98.5 F (36.9 C)  TempSrc: Oral Oral  Oral  Resp: 18 18  18   Height:      Weight:  44.725 kg (98 lb 9.6 oz)    SpO2: 99% 100% 99% 100%    Intake/Output Summary (Last 24 hours) at 02/15/16 1141 Last data filed at 02/15/16 1100  Gross per 24 hour  Intake   1880 ml  Output   1950 ml  Net    -70 ml   Filed Weights   02/13/16 0506 02/14/16 0427 02/15/16 0513  Weight: 42.91 kg (94 lb 9.6 oz) 43.772 kg (96 lb 8 oz) 44.725 kg (98 lb 9.6 oz)    Examination:  General exam: Frail thinly built male  Respiratory system: improved air movement, still with wheezes Cardiovascular system: S1 & S2 heard, RRR. No JVD, murmurs, rubs, gallops or clicks. No pedal edema. Gastrointestinal system: Abdomen is nondistended, soft and nontender. No organomegaly or masses felt. Normal bowel sounds  Central nervous system: Alert and oriented. No focal neurological deficits. Extremities: Symmetric 5 x 5 power. Skin: No rashes, lesions or ulcers Psychiatry:  Judgement and insight appear normal. Mood & affect appropriate.     Data Reviewed: I have personally reviewed following labs and imaging studies  CBC:  Recent Labs Lab 02/10/16 2205 02/11/16 0445 02/12/16 0828 02/13/16 0241 02/15/16 0400  WBC 13.4* 13.2* 11.7* 17.2* 11.3*  NEUTROABS 11.3* 10.3*  --   --   --   HGB 8.9* 9.3* 9.2* 8.0* 8.8*  HCT 27.5* 29.2* 28.7* 24.6* 26.1*  MCV 87.3 89.3  87.8 86.6 85.0  PLT 333 640* 378 363 [id number]   Basic Metabolic Panel:  Recent Labs Lab 02/10/16 2205 02/11/16 0445 02/12/16 0828 02/13/16 0241 02/15/16 0400  NA 137 138 135 134* 133*  K 3.7 4.0 3.8 3.7 4.1  CL 102 100* 95* 93* 93*  CO2 27 26 31 31 31   GLUCOSE 119* 92 225* 248* 132*  BUN 8 8 13  29* 30*  CREATININE 0.81 0.76 0.99 1.34* 1.31*  CALCIUM 8.8* 8.8* 9.0 8.5* 8.4*   GFR: Estimated Creatinine Clearance: 27 mL/min (by C-G formula based on Cr of 1.31). Liver Function Tests:  Recent Labs Lab 02/10/16 2205 02/11/16 0445  AST 24 29  ALT 16* 18  ALKPHOS 75 80  BILITOT 0.4 0.6  PROT 5.4* 6.1*  ALBUMIN 2.6* 2.7*    Recent Labs Lab 02/10/16 2205  LIPASE 12   No results for input(s): AMMONIA in the last 168 hours. Coagulation Profile: No results for input(s): INR, PROTIME in the last 168 hours. Cardiac Enzymes:  Recent Labs Lab 02/11/16 0708 02/11/16 1355  TROPONINI 0.05* 0.04*   BNP (last 3 results) No results for input(s): PROBNP in the last 8760 hours. HbA1C: No results for input(s): HGBA1C in the last 72 hours. CBG:  Recent Labs Lab 02/11/16 1127  GLUCAP 100*   Lipid Profile: No results for input(s): CHOL, HDL, LDLCALC, TRIG, CHOLHDL, LDLDIRECT in the last 72 hours. Thyroid Function Tests: No results for input(s): TSH, T4TOTAL, FREET4, T3FREE, THYROIDAB in the last 72 hours. Anemia Panel: No results for input(s): VITAMINB12, FOLATE, FERRITIN, TIBC, IRON, RETICCTPCT in the last 72 hours. Urine analysis:    Component Value Date/Time   COLORURINE YELLOW 02/11/2016 0029   APPEARANCEUR CLEAR 02/11/2016 0029   LABSPEC 1.015 02/11/2016 0029   PHURINE 5.0 02/11/2016 0029   GLUCOSEU NEGATIVE 02/11/2016 0029   HGBUR NEGATIVE 02/11/2016 0029   BILIRUBINUR NEGATIVE 02/11/2016 0029   KETONESUR NEGATIVE 02/11/2016 0029   PROTEINUR 30* 02/11/2016 0029   UROBILINOGEN 0.2 10/08/2014 0838   NITRITE NEGATIVE 02/11/2016 0029   LEUKOCYTESUR NEGATIVE  02/11/2016 0029   Sepsis Labs: @LABRCNTIP (procalcitonin:4,lacticidven:4)  ) Recent Results (from the past 240 hour(s))  Blood culture (routine x 2)     Status: None (Preliminary result)   Collection Time: 02/10/16 10:00 PM  Result Value Ref Range Status   Specimen Description BLOOD RIGHT ARM  Final   Special Requests BOTTLES DRAWN AEROBIC ONLY 5CC  Final   Culture NO GROWTH 4 DAYS  Final   Report Status PENDING  Incomplete  Blood culture (routine x 2)     Status: None (Preliminary result)   Collection Time: 02/10/16 10:05 PM  Result Value Ref Range Status   Specimen Description BLOOD RIGHT HAND  Final   Special Requests BOTTLES DRAWN AEROBIC ONLY 5CC  Final   Culture NO GROWTH 4 DAYS  Final   Report Status PENDING  Incomplete  Urine culture     Status: Abnormal   Collection Time: 02/11/16 12:29 AM  Result Value Ref Range Status  Specimen Description URINE, CATHETERIZED  Final   Special Requests NONE  Final   Culture MULTIPLE SPECIES PRESENT, SUGGEST RECOLLECTION (A)  Final   Report Status 02/12/2016 FINAL  Final  Culture, sputum-assessment     Status: None   Collection Time: 02/11/16  2:03 PM  Result Value Ref Range Status   Specimen Description SPUTUM  Final   Special Requests NONE  Final   Sputum evaluation   Final    MICROSCOPIC FINDINGS SUGGEST THAT THIS SPECIMEN IS NOT REPRESENTATIVE OF LOWER RESPIRATORY SECRETIONS. PLEASE RECOLLECT. Gram Stain Report Called to,Read Back By and Verified With: P GERHARD,RN AT 1510 02/11/16 BY L BENFIELD    Report Status 02/11/2016 FINAL  Final         Radiology Studies: No results found.      Scheduled Meds: . amLODipine  10 mg Oral Daily  . aspirin EC  81 mg Oral Daily  . atorvastatin  10 mg Oral q1800  . enoxaparin (LOVENOX) injection  30 mg Subcutaneous Q24H  . feeding supplement (ENSURE ENLIVE)  237 mL Oral TID BM  . guaiFENesin  600 mg Oral BID  . ipratropium-albuterol  3 mL Nebulization TID  . isosorbide mononitrate   60 mg Oral Daily  . [START ON 02/16/2016] levofloxacin  500 mg Oral Q48H  . metoprolol succinate  12.5 mg Oral Daily  . mometasone-formoterol  2 puff Inhalation BID  . pantoprazole  40 mg Oral Daily  . tiotropium  18 mcg Inhalation Daily   Continuous Infusions: . sodium chloride 75 mL/hr at 02/14/16 1536     LOS: 4 days    Time spent: 35min    Cristian Polite, MD [facility] Pager [phone]  If 7PM-7AM, please contact night-coverage [email or web address] Password [email or web address] 02/15/2016, 11:41 AM

## 2016-02-15 NOTE — Progress Notes (Signed)
Occupational Therapy Treatment Patient Details Name: Cristian Collins MRN: [id number] DOB: 1930-02-03 Today's Date: 02/15/2016    History of present illness 82 y.o. male admitted wth worsening SOB.  Chest x-ray showed Lt sided infiltrates concerning for PNA.  Dx:  PNA/infectious nodule, COPD exacerbation, DAC, chronic diastolic CHF, HTN   OT comments  Patient making progress towards OT goals, continue plan of care for now. Pt with good overall safety awareness and eager to gain independence, strength, and function to go home soon with wife. After activity, patients 02 sats (on 3L/min supplemental 02) = 97% and HR =81.    Follow Up Recommendations  No OT follow up;Supervision/Assistance - 24 hour    Equipment Recommendations  None recommended by OT    Recommendations for Other Services  None at this time   Precautions / Restrictions Precautions Precautions: Fall Precaution Comments: reports 2-3 falls in past month and a half at home.  Restrictions Weight Bearing Restrictions: No    Mobility Bed Mobility Overal bed mobility: Needs Assistance Bed Mobility: Supine to Sit     Supine to sit: Supervision     General bed mobility comments: HOB elevated  Transfers Overall transfer level: Needs assistance Equipment used: Straight cane Transfers: Sit to/from Stand Sit to Stand: Supervision;Min guard         General transfer comment: supervision/min guard for safety, pt tends to furniture walk with cane     Balance Overall balance assessment: Needs assistance Sitting-balance support: No upper extremity supported;Feet supported Sitting balance-Leahy Scale: Good Sitting balance - Comments: Pt performing theraband exercises seated EOB with no support    Standing balance support: Single extremity supported;During functional activity Standing balance-Leahy Scale: Fair Standing balance comment: Pt tends to try holding onto furntiure during dynamic standing tasks/activities    ADL  Overall ADL's : Needs assistance/impaired General ADL Comments: Focused on bed mobility, UE exercises using theraband, and functional mobility/ transfer to recliner.      Cognition   Behavior During Therapy: WFL for tasks assessed/performed Overall Cognitive Status: Within Functional Limits for tasks assessed      Exercises General Exercises - Upper Extremity Shoulder Flexion: AROM;Strengthening;Both;10 reps;Seated;Theraband Theraband Level (Shoulder Flexion): Level 2 (Red) Shoulder Extension: AROM;Both;10 reps;Seated Shoulder ABduction: AROM;Strengthening;Both;10 reps;Theraband;Seated Theraband Level (Shoulder Abduction): Level 2 (Red) Shoulder ADduction: AROM;Both;10 reps;Seated Shoulder Horizontal ABduction: AROM;Both;Strengthening;10 reps;Seated;Theraband Theraband Level (Shoulder Horizontal Abduction): Level 2 (Red) Shoulder Horizontal ADduction: AROM;Strengthening;Both;10 reps;Seated;Theraband Theraband Level (Shoulder Horizontal Adduction): Level 2 (Red)           Pertinent Vitals/ Pain       Pain Assessment: No/denies pain (pt reports recently taking pain meds to decrease pain)   Frequency Min 2X/week     Progress Toward Goals  OT Goals(current goals can now befound in the care plan section)  Progress towards OT goals: Progressing toward goals  Acute Rehab OT Goals Patient Stated Goal: go home when I'm ready OT Goal Formulation: With patient Time For Goal Achievement: 02/27/16 Potential to Achieve Goals: Good  Plan Discharge plan remains appropriate    End of Session Equipment Utilized During Treatment: Oxygen   Activity Tolerance Patient tolerated treatment well   Patient Left in chair;with call bell/phone within reach;with chair alarm set   Time: [id number] OT Time Calculation (min): 26 min  Charges: OT General Charges $OT Visit: 1 Procedure OT Treatments $Self Care/Home Management : 8-22 mins $Therapeutic Exercise: 8-22 mins  Chrys Racer , MS,  OTR/L, CLT Pager: [phone]  02/15/2016, 10:40 AM

## 2016-02-15 NOTE — Consult Note (Addendum)
   [facility] CM Inpatient Consult   02/15/2016  Cristian Collins 1930-07-17 [id number]  Patient screened for potential [place] Management services. Patient is eligible for [facility] Care Management services under patient's Medicare plan.   This is the patient's third admission in 6 months.  Patient is a 82 y.o. male with CAD status post stenting, CHF, COPD, chronic anemia presents to the ER because of worsening shortness of breath.  Met with patient and he says he is still being seen by [facility] and he says that they are suppose to come out again in 2 weeks.  Patient states at baseline patient he is short of breath all the time but last 24 hours he had acute worsening of shortness of breath with productive cough. Chest x-ray shows left-sided infiltrates concerning for pneumonia. Patient was admitted about 30 days ago for pneumonia. Patient will have his care management needs met with the [facility] program.  Will not follow for post hospital community unless this disposition plan changes.  Please contact if there are questions:  Natividad Brood, RN BSN [facility] Liaison  [phone] business mobile phone Toll free office [phone]

## 2016-02-16 MED ORDER — LEVOFLOXACIN 500 MG PO TABS: 500.0000 mg | ORAL_TABLET | ORAL | Status: DC

## 2016-02-16 MED ORDER — GUAIFENESIN ER 600 MG PO TB12: 1200.0000 mg | ORAL_TABLET | Freq: Two times a day (BID) | ORAL | Status: AC

## 2016-02-16 MED ORDER — PREDNISONE 10 MG PO TABS: ORAL_TABLET | ORAL | Status: DC

## 2016-02-16 NOTE — Plan of Care (Signed)
Problem: Activity: Goal: Risk for activity intolerance will decrease Outcome: Completed/Met Date Met:  02/16/16 Up in chair and ambulates in room with one assist and tolerates well.

## 2016-02-16 NOTE — Progress Notes (Signed)
Patient up in chair today and denies pain or other discomfort.  Anticipating discharge home today.  Stated he will need oxygen from [place] for ride home today per past experience.  Will f/u with Case Manager to confirm.
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
## 2016-02-16 NOTE — Progress Notes (Signed)
Reviewed discharge instructions with patient and he stated understanding of all instructions.  Aware prescriptions were called into [facility] on [address].  Denies pain.  Patient's brother transporting him home vja car transport.  Transporting via Wheelchair to car ride home on 02 at 2 L nasal cannula.  No voiced complaints.

## 2016-02-18 DIAGNOSIS — D649 Anemia, unspecified: Secondary | ICD-10-CM | POA: Diagnosis not present

## 2016-02-18 DIAGNOSIS — I2510 Atherosclerotic heart disease of native coronary artery without angina pectoris: Secondary | ICD-10-CM | POA: Diagnosis not present

## 2016-02-18 DIAGNOSIS — Z7982 Long term (current) use of aspirin: Secondary | ICD-10-CM | POA: Diagnosis not present

## 2016-02-18 DIAGNOSIS — J441 Chronic obstructive pulmonary disease with (acute) exacerbation: Secondary | ICD-10-CM | POA: Diagnosis not present

## 2016-02-18 DIAGNOSIS — J189 Pneumonia, unspecified organism: Secondary | ICD-10-CM | POA: Diagnosis not present

## 2016-02-18 DIAGNOSIS — Z9981 Dependence on supplemental oxygen: Secondary | ICD-10-CM | POA: Diagnosis not present

## 2016-02-18 DIAGNOSIS — Z7951 Long term (current) use of inhaled steroids: Secondary | ICD-10-CM | POA: Diagnosis not present

## 2016-02-18 DIAGNOSIS — I10 Essential (primary) hypertension: Secondary | ICD-10-CM | POA: Diagnosis not present

## 2016-02-20 ENCOUNTER — Encounter: Payer: Self-pay | Admitting: Internal Medicine

## 2016-02-20 ENCOUNTER — Ambulatory Visit (INDEPENDENT_AMBULATORY_CARE_PROVIDER_SITE_OTHER): Payer: Medicare Other | Admitting: Internal Medicine

## 2016-02-20 ENCOUNTER — Ambulatory Visit (INDEPENDENT_AMBULATORY_CARE_PROVIDER_SITE_OTHER)
Admission: RE | Admit: 2016-02-20 | Discharge: 2016-02-20 | Disposition: A | Discharge status: Home / Self Care | Payer: Medicare Other | Source: Ambulatory Visit | Attending: Internal Medicine | Admitting: Internal Medicine

## 2016-02-20 VITALS — BP 90/54 | HR 80 | Ht 66.0 in | Wt 96.0 lb

## 2016-02-20 DIAGNOSIS — R05 Cough: Secondary | ICD-10-CM | POA: Diagnosis not present

## 2016-02-20 DIAGNOSIS — J9612 Chronic respiratory failure with hypercapnia: Secondary | ICD-10-CM | POA: Diagnosis not present

## 2016-02-20 DIAGNOSIS — J449 Chronic obstructive pulmonary disease, unspecified: Secondary | ICD-10-CM

## 2016-02-20 DIAGNOSIS — J189 Pneumonia, unspecified organism: Secondary | ICD-10-CM

## 2016-02-20 NOTE — Progress Notes (Signed)
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   rec Symbicort 160 Take 2 puffs first thing in am and then another 2 puffs about 12 hours later.  Stop advair Stay on spiriva Work on inhaler technique: . Please schedule a follow up office visit in 4 weeks, sooner if needed with pft's     01/20/2014 f/u ov/Analiah Drum re: GOLD III COPD/ now 02 dep 2lpm 24/7  Chief Complaint  Patient presents with  . Follow-up    Pt states that his breathing continues to improve. He has minimal cough with clear sputum that he relates to PND. He c/o green nasal d/c for the past several days.   symbicort/spiriva each am/ very confused with instructions/ names of meds / now has neb also and now on 02  rec Plan A = symbicort 160 Take 2 puffs first thing in am and then another 2 puffs about 12 hours later.                 spiriva also each am Plan B= backup = proair up to 2 puff every 4 hours Plan C = Nebulizer use this up to every 4 hours if Plan B doesn't work Plan D = Doctor,  Call if needing more nebulizer treatments than usual  Work on inhaler technique     Admit date: 10/07/2014 Discharge date: 10/14/2014    Recommendations for Outpatient Follow-up:  1. Follow-up with primary care physician in one week. 2. Follow-up with Dr. Melvyn Novas on 10/24/2014 at 9 AM  Discharge Diagnoses:  Active Problems:  COPD exacerbation  Protein-calorie  malnutrition, severe  Essential hypertension  CAD (coronary artery disease)  AKI (acute kidney injury)  Normocytic anemia  Anemia, iron deficiency  Acute respiratory failure with hypoxia   Discharge Condition: Stable  Diet recommendation: Heart healthy  Filed Weights   10/12/14 0500 10/13/14 0500 10/14/14 0500  Weight: 45.9 kg (101 lb 3.1 oz) 45.314 kg (99 lb 14.4 oz) 44.906 kg (99 lb)    History of present illness:  82 year old male who has a past medical history of COPD presents to the hospital with chief complaint of shortness of breath which started 1 day PTA, patient has chronic home O2 with history of COPD and CHF. He also complains of coughing up phlegm, no fever or chills. Denies any chest pain. Patient is followed by pulmonary and cardiology as outpatient. He denies nausea vomiting or diarrhea. Admits to shortness of breath on exertion.  Hospital Course:   COPD acute exacerbation - Presented with SOB, wheezing and sputum production. - Started on bronchodilators, mucolytics, antitussives and oxygen as needed. - IV antibiotics and IV steroids. - Patient developed more shortness of breath while he was in the hospital, treated with IV Lasix - Patient received a total of 7 days of IV levofloxacin. - On discharge discharged on prednisone taper and Mucinex,  outpatient follow-up with Dr. Melvyn Novas in 10 days. - Pulmonology recommended prolonged prednisone taper and keep patient on 20 mg until he sees Dr. Melvyn Novas.  Musculoskeletal pain - Continue low dose morphine prn  Iron deficiency anemia, had some dark streaks in stools last week. Hemoglobin stable around 8.5mg /dl - Continue increased iron supplementation - Occult negative - Continue iron supplementation.  Acute kidney injury due to dehydration, creatinine trended down to 1.2 with IVF, resolved. This is resolved, patient restarted on his home dose of Demadex and the time of discharge.  Hx of a-fib s/p  ablation and pacemaker placement - Tele: NSR, tele now off  Hypertension Continue amlodipine 10 mg daily  CAD - Continue statin, metoprolol, isosorbide, ASA  Hyperlipidemia Continue Zocor  Severe protein calorie malnutrition - Nutrition consult - Supplements - Liberalize diet  Leukocytosis, likely steroid-induced  Chronic respiratory failure -Patient is on 2 L of oxygen at home, on discharge he only needed 2 L of oxygen.          01/15/2016 [facility] follow up : COPD GOLD III /O2 dependent Patient presents for a post hospital follow-up. Patient was recently admitted for a COPD exacerbation, right lower lobe streptococcal pneumonia and acute on chronic hypoxic and hypercarbic respiratory failure. Patient was treated with IV antibiotics, steroids and nebulized bronchodilators. Discharged on Levaquin and a prednisone taper.has few days left on rx.   rec Continue on current regimen  Check with your family doctor to see if you have gotten your pneumonia vaccines . Pneumovax and Prevnar 13.  Follow up with Dr. Melvyn Novas  In 4-6 weeks with chest xray    02/20/2016  f/u ov/Romonda Parker re: copd III/ 02 2-3 lpm on symb/spiriva and duoneb ? Prn  Chief Complaint  Patient presents with  . Follow-up    Breathing is unchanged. No new co's today.    thinks he got prevnar 13 from [facility]  Very confused about details of care  Doe baseline = MMRC3 = can't walk 100 yards even at a slow pace at a flat grade s stopping due to sob    No obvious day to day or daytime variability or assoc excess/ purulent sputum or mucus plugs or hemoptysis or cp or chest tightness, subjective wheeze or overt sinus or hb symptoms. No unusual exp hx or h/o childhood pna/ asthma or knowledge of premature birth.  Sleeping ok without nocturnal  or early am exacerbation  of respiratory  c/o's or need for noct saba. Also denies any obvious fluctuation of symptoms with weather or environmental changes or other  aggravating or alleviating factors except as outlined above   Current Medications, Allergies, Complete Past Medical History, Past Surgical History, Family History, and Social History were reviewed in [employer] record.  ROS  The following are not active complaints unless bolded sore throat, dysphagia, dental problems, itching, sneezing,  nasal congestion or excess/ purulent secretions, ear ache,   fever, chills, sweats, unintended wt loss, classically pleuritic or exertional cp,  orthopnea pnd or leg swelling, presyncope, palpitations, abdominal pain, anorexia, nausea, vomiting, diarrhea  or change in bowel or bladder habits, change in stools or urine, dysuria,hematuria,  rash, arthralgias, visual complaints, headache, numbness, weakness or ataxia or problems with walking or coordination,  change in mood/affect or memory.                       Objective:   Physical Exam     thin bm nad walking with  cane mildly   Hoarse  05/11/2014       105  > 09/13/2014  103 >  10/24/2014 99 > 02/20/2016 96   Vital signs reviewed - note 98% on 3lpm at rest       HEENT mild turbinate edema.  Edentulous with dentures in place.Oropharynx no thrush or excess pnd or cobblestoning.  No JVD or cervical adenopathy. Mild accessory muscle hypertrophy. Trachea midline, nl thryroid. Chest was hyperinflated by percussion with diminished breath sounds and moderate increased exp time without wheeze. Hoover sign positive at mid inspiration. Regular rate and rhythm without murmur gallop or rub or increase P2 or edema.  Abd: no hsm, nl excursion. Ext warm without cyanosis or clubbing.        CXR PA and Lateral:   02/20/2016 :    I personally reviewed images and agree with radiology impression as follows:     1. Interval decrease size and density in the previously described focal left upper lobe opacity. This supports an infectious/inflammatory etiology. Recommend follow-up in 4-6 weeks to  document resolution. 2. No new abnormalities. Stable changes of advanced COPD.

## 2016-02-20 NOTE — Patient Instructions (Addendum)
We will check with [facility] to be sure you received your prevnar   Please remember to go to the  x-ray department downstairs for your tests - we will call you with the results when they are available.     If you start needing your rescue more often, we need to see you right away - otherwise return in 3 months  .

## 2016-02-21 NOTE — Progress Notes (Signed)
Quick Note:  Spoke with pt and notified of results per Dr. Wert. Pt verbalized understanding and denied any questions.  ______ 

## 2016-02-22 ENCOUNTER — Ambulatory Visit: Payer: Medicare Other

## 2016-02-22 DIAGNOSIS — I2510 Atherosclerotic heart disease of native coronary artery without angina pectoris: Secondary | ICD-10-CM | POA: Diagnosis not present

## 2016-02-22 DIAGNOSIS — Z7951 Long term (current) use of inhaled steroids: Secondary | ICD-10-CM | POA: Diagnosis not present

## 2016-02-22 DIAGNOSIS — Z7982 Long term (current) use of aspirin: Secondary | ICD-10-CM | POA: Diagnosis not present

## 2016-02-22 DIAGNOSIS — Z9981 Dependence on supplemental oxygen: Secondary | ICD-10-CM | POA: Diagnosis not present

## 2016-02-22 DIAGNOSIS — J189 Pneumonia, unspecified organism: Secondary | ICD-10-CM | POA: Diagnosis not present

## 2016-02-22 DIAGNOSIS — I10 Essential (primary) hypertension: Secondary | ICD-10-CM | POA: Diagnosis not present

## 2016-02-22 DIAGNOSIS — J441 Chronic obstructive pulmonary disease with (acute) exacerbation: Secondary | ICD-10-CM | POA: Diagnosis not present

## 2016-02-22 DIAGNOSIS — D649 Anemia, unspecified: Secondary | ICD-10-CM | POA: Diagnosis not present

## 2016-02-24 DIAGNOSIS — Z7951 Long term (current) use of inhaled steroids: Secondary | ICD-10-CM | POA: Diagnosis not present

## 2016-02-24 DIAGNOSIS — D649 Anemia, unspecified: Secondary | ICD-10-CM | POA: Diagnosis not present

## 2016-02-24 DIAGNOSIS — Z7982 Long term (current) use of aspirin: Secondary | ICD-10-CM | POA: Diagnosis not present

## 2016-02-24 DIAGNOSIS — I10 Essential (primary) hypertension: Secondary | ICD-10-CM | POA: Diagnosis not present

## 2016-02-24 DIAGNOSIS — J189 Pneumonia, unspecified organism: Secondary | ICD-10-CM | POA: Diagnosis not present

## 2016-02-24 DIAGNOSIS — J441 Chronic obstructive pulmonary disease with (acute) exacerbation: Secondary | ICD-10-CM | POA: Diagnosis not present

## 2016-02-24 DIAGNOSIS — I2510 Atherosclerotic heart disease of native coronary artery without angina pectoris: Secondary | ICD-10-CM | POA: Diagnosis not present

## 2016-02-24 DIAGNOSIS — Z9981 Dependence on supplemental oxygen: Secondary | ICD-10-CM | POA: Diagnosis not present

## 2016-02-25 ENCOUNTER — Encounter: Payer: Self-pay | Admitting: Internal Medicine

## 2016-02-25 NOTE — Assessment & Plan Note (Signed)
Started p admit 11/27/13 @ 2lpm  - 01/20/2014   Walked 2lpm  x one lap @ 185 stopped due to sob no desat   - 05/11/2014  Walked 2lpm pulsed  x one lap @ 185 stopped due to sob/ no desat    - 10/13/14 HC03 33  - 10/28/2014  Walked 2lpmx 3 laps @ 185 ft each stopped due to end of study, no desats  - 02/15/16  HCO3 = 31   Adequate control on present rx, reviewed > no change in rx needed  = 2-3.pm 24/7

## 2016-02-25 NOTE — Assessment & Plan Note (Signed)
-   PFTs 05/21/2012 0.69 (32) ratio 41 and 34% better p B2 - 09/13/2014 p extensive coaching HFA effectiveness =   90%  - Sinus CT 01/07/2016 >>> No significant paranasal sinus mucosal thickening  Adequate control on present rx, reviewed > no change in rx needed though remains severe with limited insight into meds/ contingencies  I had an extended discussion with the patient reviewing all relevant studies completed to date and  lasting 15 to 20 minutes of a 25 minute visit    Each maintenance medication was reviewed in detail including most importantly the difference between maintenance and prns and under what circumstances the prns are to be triggered using an action plan format that is not reflected in the computer generated alphabetically organized AVS.    Please see instructions for details which were reviewed in writing and the patient given a copy highlighting the part that I personally wrote and discussed at today's ov.

## 2016-02-25 NOTE — Assessment & Plan Note (Signed)
Adequately / appropriately treated with cxr lag expected due to underlying severe copd > no further studies needed   Need to confirm prevnar given x one

## 2016-02-26 DIAGNOSIS — J441 Chronic obstructive pulmonary disease with (acute) exacerbation: Secondary | ICD-10-CM | POA: Diagnosis not present

## 2016-02-26 DIAGNOSIS — I10 Essential (primary) hypertension: Secondary | ICD-10-CM | POA: Diagnosis not present

## 2016-02-26 DIAGNOSIS — D649 Anemia, unspecified: Secondary | ICD-10-CM | POA: Diagnosis not present

## 2016-02-26 DIAGNOSIS — Z7982 Long term (current) use of aspirin: Secondary | ICD-10-CM | POA: Diagnosis not present

## 2016-02-26 DIAGNOSIS — Z9981 Dependence on supplemental oxygen: Secondary | ICD-10-CM | POA: Diagnosis not present

## 2016-02-26 DIAGNOSIS — I2510 Atherosclerotic heart disease of native coronary artery without angina pectoris: Secondary | ICD-10-CM | POA: Diagnosis not present

## 2016-02-26 DIAGNOSIS — J189 Pneumonia, unspecified organism: Secondary | ICD-10-CM | POA: Diagnosis not present

## 2016-02-26 DIAGNOSIS — Z7951 Long term (current) use of inhaled steroids: Secondary | ICD-10-CM | POA: Diagnosis not present

## 2016-02-28 DIAGNOSIS — J441 Chronic obstructive pulmonary disease with (acute) exacerbation: Secondary | ICD-10-CM | POA: Diagnosis not present

## 2016-02-28 DIAGNOSIS — Z7951 Long term (current) use of inhaled steroids: Secondary | ICD-10-CM | POA: Diagnosis not present

## 2016-02-28 DIAGNOSIS — J189 Pneumonia, unspecified organism: Secondary | ICD-10-CM | POA: Diagnosis not present

## 2016-02-28 DIAGNOSIS — J449 Chronic obstructive pulmonary disease, unspecified: Secondary | ICD-10-CM | POA: Diagnosis not present

## 2016-02-28 DIAGNOSIS — I2510 Atherosclerotic heart disease of native coronary artery without angina pectoris: Secondary | ICD-10-CM | POA: Diagnosis not present

## 2016-02-28 DIAGNOSIS — Z7982 Long term (current) use of aspirin: Secondary | ICD-10-CM | POA: Diagnosis not present

## 2016-02-28 DIAGNOSIS — I10 Essential (primary) hypertension: Secondary | ICD-10-CM | POA: Diagnosis not present

## 2016-02-28 DIAGNOSIS — D649 Anemia, unspecified: Secondary | ICD-10-CM | POA: Diagnosis not present

## 2016-02-28 DIAGNOSIS — Z9981 Dependence on supplemental oxygen: Secondary | ICD-10-CM | POA: Diagnosis not present

## 2016-03-01 DIAGNOSIS — Z7982 Long term (current) use of aspirin: Secondary | ICD-10-CM | POA: Diagnosis not present

## 2016-03-01 DIAGNOSIS — Z7951 Long term (current) use of inhaled steroids: Secondary | ICD-10-CM | POA: Diagnosis not present

## 2016-03-01 DIAGNOSIS — J441 Chronic obstructive pulmonary disease with (acute) exacerbation: Secondary | ICD-10-CM | POA: Diagnosis not present

## 2016-03-01 DIAGNOSIS — I2510 Atherosclerotic heart disease of native coronary artery without angina pectoris: Secondary | ICD-10-CM | POA: Diagnosis not present

## 2016-03-01 DIAGNOSIS — Z9981 Dependence on supplemental oxygen: Secondary | ICD-10-CM | POA: Diagnosis not present

## 2016-03-01 DIAGNOSIS — I10 Essential (primary) hypertension: Secondary | ICD-10-CM | POA: Diagnosis not present

## 2016-03-01 DIAGNOSIS — J189 Pneumonia, unspecified organism: Secondary | ICD-10-CM | POA: Diagnosis not present

## 2016-03-01 DIAGNOSIS — D649 Anemia, unspecified: Secondary | ICD-10-CM | POA: Diagnosis not present

## 2016-03-05 DIAGNOSIS — Z9981 Dependence on supplemental oxygen: Secondary | ICD-10-CM | POA: Diagnosis not present

## 2016-03-05 DIAGNOSIS — I2510 Atherosclerotic heart disease of native coronary artery without angina pectoris: Secondary | ICD-10-CM | POA: Diagnosis not present

## 2016-03-05 DIAGNOSIS — J441 Chronic obstructive pulmonary disease with (acute) exacerbation: Secondary | ICD-10-CM | POA: Diagnosis not present

## 2016-03-05 DIAGNOSIS — Z7982 Long term (current) use of aspirin: Secondary | ICD-10-CM | POA: Diagnosis not present

## 2016-03-05 DIAGNOSIS — I10 Essential (primary) hypertension: Secondary | ICD-10-CM | POA: Diagnosis not present

## 2016-03-05 DIAGNOSIS — Z7951 Long term (current) use of inhaled steroids: Secondary | ICD-10-CM | POA: Diagnosis not present

## 2016-03-05 DIAGNOSIS — J189 Pneumonia, unspecified organism: Secondary | ICD-10-CM | POA: Diagnosis not present

## 2016-03-05 DIAGNOSIS — D649 Anemia, unspecified: Secondary | ICD-10-CM | POA: Diagnosis not present

## 2016-03-07 DIAGNOSIS — J189 Pneumonia, unspecified organism: Secondary | ICD-10-CM | POA: Diagnosis not present

## 2016-03-07 DIAGNOSIS — I10 Essential (primary) hypertension: Secondary | ICD-10-CM | POA: Diagnosis not present

## 2016-03-07 DIAGNOSIS — Z9981 Dependence on supplemental oxygen: Secondary | ICD-10-CM | POA: Diagnosis not present

## 2016-03-07 DIAGNOSIS — Z7951 Long term (current) use of inhaled steroids: Secondary | ICD-10-CM | POA: Diagnosis not present

## 2016-03-07 DIAGNOSIS — D649 Anemia, unspecified: Secondary | ICD-10-CM | POA: Diagnosis not present

## 2016-03-07 DIAGNOSIS — J449 Chronic obstructive pulmonary disease, unspecified: Secondary | ICD-10-CM | POA: Diagnosis not present

## 2016-03-07 DIAGNOSIS — Z7982 Long term (current) use of aspirin: Secondary | ICD-10-CM | POA: Diagnosis not present

## 2016-03-07 DIAGNOSIS — I2510 Atherosclerotic heart disease of native coronary artery without angina pectoris: Secondary | ICD-10-CM | POA: Diagnosis not present

## 2016-03-07 DIAGNOSIS — J441 Chronic obstructive pulmonary disease with (acute) exacerbation: Secondary | ICD-10-CM | POA: Diagnosis not present

## 2016-03-08 DIAGNOSIS — Z7982 Long term (current) use of aspirin: Secondary | ICD-10-CM | POA: Diagnosis not present

## 2016-03-08 DIAGNOSIS — Z7951 Long term (current) use of inhaled steroids: Secondary | ICD-10-CM | POA: Diagnosis not present

## 2016-03-08 DIAGNOSIS — J441 Chronic obstructive pulmonary disease with (acute) exacerbation: Secondary | ICD-10-CM | POA: Diagnosis not present

## 2016-03-08 DIAGNOSIS — J189 Pneumonia, unspecified organism: Secondary | ICD-10-CM | POA: Diagnosis not present

## 2016-03-08 DIAGNOSIS — D649 Anemia, unspecified: Secondary | ICD-10-CM | POA: Diagnosis not present

## 2016-03-08 DIAGNOSIS — Z9981 Dependence on supplemental oxygen: Secondary | ICD-10-CM | POA: Diagnosis not present

## 2016-03-08 DIAGNOSIS — I2510 Atherosclerotic heart disease of native coronary artery without angina pectoris: Secondary | ICD-10-CM | POA: Diagnosis not present

## 2016-03-08 DIAGNOSIS — I10 Essential (primary) hypertension: Secondary | ICD-10-CM | POA: Diagnosis not present

## 2016-03-11 DIAGNOSIS — J189 Pneumonia, unspecified organism: Secondary | ICD-10-CM | POA: Diagnosis not present

## 2016-03-11 DIAGNOSIS — J441 Chronic obstructive pulmonary disease with (acute) exacerbation: Secondary | ICD-10-CM | POA: Diagnosis not present

## 2016-03-11 DIAGNOSIS — I10 Essential (primary) hypertension: Secondary | ICD-10-CM | POA: Diagnosis not present

## 2016-03-11 DIAGNOSIS — Z9981 Dependence on supplemental oxygen: Secondary | ICD-10-CM | POA: Diagnosis not present

## 2016-03-11 DIAGNOSIS — D649 Anemia, unspecified: Secondary | ICD-10-CM | POA: Diagnosis not present

## 2016-03-11 DIAGNOSIS — Z7951 Long term (current) use of inhaled steroids: Secondary | ICD-10-CM | POA: Diagnosis not present

## 2016-03-11 DIAGNOSIS — Z7982 Long term (current) use of aspirin: Secondary | ICD-10-CM | POA: Diagnosis not present

## 2016-03-11 DIAGNOSIS — I2510 Atherosclerotic heart disease of native coronary artery without angina pectoris: Secondary | ICD-10-CM | POA: Diagnosis not present

## 2016-03-12 DIAGNOSIS — Z7982 Long term (current) use of aspirin: Secondary | ICD-10-CM | POA: Diagnosis not present

## 2016-03-12 DIAGNOSIS — J189 Pneumonia, unspecified organism: Secondary | ICD-10-CM | POA: Diagnosis not present

## 2016-03-12 DIAGNOSIS — Z9981 Dependence on supplemental oxygen: Secondary | ICD-10-CM | POA: Diagnosis not present

## 2016-03-12 DIAGNOSIS — D649 Anemia, unspecified: Secondary | ICD-10-CM | POA: Diagnosis not present

## 2016-03-12 DIAGNOSIS — I10 Essential (primary) hypertension: Secondary | ICD-10-CM | POA: Diagnosis not present

## 2016-03-12 DIAGNOSIS — Z7951 Long term (current) use of inhaled steroids: Secondary | ICD-10-CM | POA: Diagnosis not present

## 2016-03-12 DIAGNOSIS — I2510 Atherosclerotic heart disease of native coronary artery without angina pectoris: Secondary | ICD-10-CM | POA: Diagnosis not present

## 2016-03-12 DIAGNOSIS — J441 Chronic obstructive pulmonary disease with (acute) exacerbation: Secondary | ICD-10-CM | POA: Diagnosis not present

## 2016-03-14 DIAGNOSIS — J189 Pneumonia, unspecified organism: Secondary | ICD-10-CM | POA: Diagnosis not present

## 2016-03-14 DIAGNOSIS — I10 Essential (primary) hypertension: Secondary | ICD-10-CM | POA: Diagnosis not present

## 2016-03-14 DIAGNOSIS — Z9981 Dependence on supplemental oxygen: Secondary | ICD-10-CM | POA: Diagnosis not present

## 2016-03-14 DIAGNOSIS — Z7982 Long term (current) use of aspirin: Secondary | ICD-10-CM | POA: Diagnosis not present

## 2016-03-14 DIAGNOSIS — J441 Chronic obstructive pulmonary disease with (acute) exacerbation: Secondary | ICD-10-CM | POA: Diagnosis not present

## 2016-03-14 DIAGNOSIS — I2510 Atherosclerotic heart disease of native coronary artery without angina pectoris: Secondary | ICD-10-CM | POA: Diagnosis not present

## 2016-03-14 DIAGNOSIS — Z7951 Long term (current) use of inhaled steroids: Secondary | ICD-10-CM | POA: Diagnosis not present

## 2016-03-14 DIAGNOSIS — D649 Anemia, unspecified: Secondary | ICD-10-CM | POA: Diagnosis not present

## 2016-03-15 DIAGNOSIS — J449 Chronic obstructive pulmonary disease, unspecified: Secondary | ICD-10-CM | POA: Diagnosis not present

## 2016-03-18 DIAGNOSIS — R911 Solitary pulmonary nodule: Secondary | ICD-10-CM | POA: Diagnosis not present

## 2016-03-18 DIAGNOSIS — J189 Pneumonia, unspecified organism: Secondary | ICD-10-CM | POA: Diagnosis not present

## 2016-03-18 DIAGNOSIS — Z87891 Personal history of nicotine dependence: Secondary | ICD-10-CM | POA: Diagnosis not present

## 2016-03-18 DIAGNOSIS — J449 Chronic obstructive pulmonary disease, unspecified: Secondary | ICD-10-CM | POA: Diagnosis not present

## 2016-03-19 DIAGNOSIS — I10 Essential (primary) hypertension: Secondary | ICD-10-CM | POA: Diagnosis not present

## 2016-03-19 DIAGNOSIS — J441 Chronic obstructive pulmonary disease with (acute) exacerbation: Secondary | ICD-10-CM | POA: Diagnosis not present

## 2016-03-19 DIAGNOSIS — D649 Anemia, unspecified: Secondary | ICD-10-CM | POA: Diagnosis not present

## 2016-03-19 DIAGNOSIS — Z7951 Long term (current) use of inhaled steroids: Secondary | ICD-10-CM | POA: Diagnosis not present

## 2016-03-19 DIAGNOSIS — Z9981 Dependence on supplemental oxygen: Secondary | ICD-10-CM | POA: Diagnosis not present

## 2016-03-19 DIAGNOSIS — J189 Pneumonia, unspecified organism: Secondary | ICD-10-CM | POA: Diagnosis not present

## 2016-03-19 DIAGNOSIS — Z7982 Long term (current) use of aspirin: Secondary | ICD-10-CM | POA: Diagnosis not present

## 2016-03-19 DIAGNOSIS — I2510 Atherosclerotic heart disease of native coronary artery without angina pectoris: Secondary | ICD-10-CM | POA: Diagnosis not present

## 2016-03-20 DIAGNOSIS — J441 Chronic obstructive pulmonary disease with (acute) exacerbation: Secondary | ICD-10-CM | POA: Diagnosis not present

## 2016-03-20 DIAGNOSIS — J189 Pneumonia, unspecified organism: Secondary | ICD-10-CM | POA: Diagnosis not present

## 2016-03-20 DIAGNOSIS — D649 Anemia, unspecified: Secondary | ICD-10-CM | POA: Diagnosis not present

## 2016-03-20 DIAGNOSIS — I2510 Atherosclerotic heart disease of native coronary artery without angina pectoris: Secondary | ICD-10-CM | POA: Diagnosis not present

## 2016-03-20 DIAGNOSIS — Z9981 Dependence on supplemental oxygen: Secondary | ICD-10-CM | POA: Diagnosis not present

## 2016-03-20 DIAGNOSIS — I10 Essential (primary) hypertension: Secondary | ICD-10-CM | POA: Diagnosis not present

## 2016-03-20 DIAGNOSIS — Z7982 Long term (current) use of aspirin: Secondary | ICD-10-CM | POA: Diagnosis not present

## 2016-03-20 DIAGNOSIS — Z7951 Long term (current) use of inhaled steroids: Secondary | ICD-10-CM | POA: Diagnosis not present

## 2016-03-23 DIAGNOSIS — Z9981 Dependence on supplemental oxygen: Secondary | ICD-10-CM | POA: Diagnosis not present

## 2016-03-23 DIAGNOSIS — J441 Chronic obstructive pulmonary disease with (acute) exacerbation: Secondary | ICD-10-CM | POA: Diagnosis not present

## 2016-03-23 DIAGNOSIS — I2510 Atherosclerotic heart disease of native coronary artery without angina pectoris: Secondary | ICD-10-CM | POA: Diagnosis not present

## 2016-03-23 DIAGNOSIS — I10 Essential (primary) hypertension: Secondary | ICD-10-CM | POA: Diagnosis not present

## 2016-03-23 DIAGNOSIS — Z7951 Long term (current) use of inhaled steroids: Secondary | ICD-10-CM | POA: Diagnosis not present

## 2016-03-23 DIAGNOSIS — Z7982 Long term (current) use of aspirin: Secondary | ICD-10-CM | POA: Diagnosis not present

## 2016-03-23 DIAGNOSIS — J189 Pneumonia, unspecified organism: Secondary | ICD-10-CM | POA: Diagnosis not present

## 2016-03-23 DIAGNOSIS — D649 Anemia, unspecified: Secondary | ICD-10-CM | POA: Diagnosis not present

## 2016-03-26 DIAGNOSIS — Z9981 Dependence on supplemental oxygen: Secondary | ICD-10-CM | POA: Diagnosis not present

## 2016-03-26 DIAGNOSIS — D649 Anemia, unspecified: Secondary | ICD-10-CM | POA: Diagnosis not present

## 2016-03-26 DIAGNOSIS — Z7982 Long term (current) use of aspirin: Secondary | ICD-10-CM | POA: Diagnosis not present

## 2016-03-26 DIAGNOSIS — J441 Chronic obstructive pulmonary disease with (acute) exacerbation: Secondary | ICD-10-CM | POA: Diagnosis not present

## 2016-03-26 DIAGNOSIS — Z7951 Long term (current) use of inhaled steroids: Secondary | ICD-10-CM | POA: Diagnosis not present

## 2016-03-26 DIAGNOSIS — J189 Pneumonia, unspecified organism: Secondary | ICD-10-CM | POA: Diagnosis not present

## 2016-03-26 DIAGNOSIS — I2510 Atherosclerotic heart disease of native coronary artery without angina pectoris: Secondary | ICD-10-CM | POA: Diagnosis not present

## 2016-03-26 DIAGNOSIS — I10 Essential (primary) hypertension: Secondary | ICD-10-CM | POA: Diagnosis not present

## 2016-03-30 DIAGNOSIS — J449 Chronic obstructive pulmonary disease, unspecified: Secondary | ICD-10-CM | POA: Diagnosis not present

## 2016-04-07 DIAGNOSIS — J449 Chronic obstructive pulmonary disease, unspecified: Secondary | ICD-10-CM | POA: Diagnosis not present

## 2016-04-15 DIAGNOSIS — J449 Chronic obstructive pulmonary disease, unspecified: Secondary | ICD-10-CM | POA: Diagnosis not present

## 2016-04-29 DIAGNOSIS — R7309 Other abnormal glucose: Secondary | ICD-10-CM | POA: Diagnosis not present

## 2016-04-29 DIAGNOSIS — I10 Essential (primary) hypertension: Secondary | ICD-10-CM | POA: Diagnosis not present

## 2016-04-29 DIAGNOSIS — Z23 Encounter for immunization: Secondary | ICD-10-CM | POA: Diagnosis not present

## 2016-04-29 DIAGNOSIS — E559 Vitamin D deficiency, unspecified: Secondary | ICD-10-CM | POA: Diagnosis not present

## 2016-04-29 DIAGNOSIS — Z0000 Encounter for general adult medical examination without abnormal findings: Secondary | ICD-10-CM | POA: Diagnosis not present

## 2016-04-29 DIAGNOSIS — Z79899 Other long term (current) drug therapy: Secondary | ICD-10-CM | POA: Diagnosis not present

## 2016-04-29 DIAGNOSIS — R627 Adult failure to thrive: Secondary | ICD-10-CM | POA: Diagnosis not present

## 2016-04-30 DIAGNOSIS — J449 Chronic obstructive pulmonary disease, unspecified: Secondary | ICD-10-CM | POA: Diagnosis not present

## 2016-05-08 DIAGNOSIS — J449 Chronic obstructive pulmonary disease, unspecified: Secondary | ICD-10-CM | POA: Diagnosis not present

## 2016-05-15 DIAGNOSIS — J449 Chronic obstructive pulmonary disease, unspecified: Secondary | ICD-10-CM | POA: Diagnosis not present

## 2016-05-23 ENCOUNTER — Ambulatory Visit: Payer: Medicare Other | Admitting: Internal Medicine

## 2016-05-30 DIAGNOSIS — J449 Chronic obstructive pulmonary disease, unspecified: Secondary | ICD-10-CM | POA: Diagnosis not present

## 2016-06-07 DIAGNOSIS — J449 Chronic obstructive pulmonary disease, unspecified: Secondary | ICD-10-CM | POA: Diagnosis not present

## 2016-06-26 DIAGNOSIS — I2781 Cor pulmonale (chronic): Secondary | ICD-10-CM | POA: Diagnosis not present

## 2016-06-26 DIAGNOSIS — I2510 Atherosclerotic heart disease of native coronary artery without angina pectoris: Secondary | ICD-10-CM | POA: Diagnosis not present

## 2016-06-26 DIAGNOSIS — R0602 Shortness of breath: Secondary | ICD-10-CM | POA: Diagnosis not present

## 2016-06-26 DIAGNOSIS — D508 Other iron deficiency anemias: Secondary | ICD-10-CM | POA: Diagnosis not present

## 2016-06-30 DIAGNOSIS — J449 Chronic obstructive pulmonary disease, unspecified: Secondary | ICD-10-CM | POA: Diagnosis not present

## 2016-07-08 DIAGNOSIS — J449 Chronic obstructive pulmonary disease, unspecified: Secondary | ICD-10-CM | POA: Diagnosis not present

## 2016-07-15 DIAGNOSIS — E785 Hyperlipidemia, unspecified: Secondary | ICD-10-CM | POA: Diagnosis not present

## 2016-07-15 DIAGNOSIS — J449 Chronic obstructive pulmonary disease, unspecified: Secondary | ICD-10-CM | POA: Diagnosis not present

## 2016-07-15 DIAGNOSIS — D649 Anemia, unspecified: Secondary | ICD-10-CM | POA: Diagnosis not present

## 2016-07-15 DIAGNOSIS — I10 Essential (primary) hypertension: Secondary | ICD-10-CM | POA: Diagnosis not present

## 2016-07-18 ENCOUNTER — Emergency Department (HOSPITAL_COMMUNITY): Payer: Medicare Other

## 2016-07-18 ENCOUNTER — Encounter (HOSPITAL_COMMUNITY): Payer: Self-pay | Admitting: Emergency Medicine

## 2016-07-18 ENCOUNTER — Emergency Department (HOSPITAL_COMMUNITY)
Admission: EM | Admit: 2016-07-18 | Discharge: 2016-07-18 | Disposition: A | Discharge status: Home / Self Care | Payer: Medicare Other | Attending: Emergency Medicine | Admitting: Emergency Medicine

## 2016-07-18 DIAGNOSIS — I130 Hypertensive heart and chronic kidney disease with heart failure and stage 1 through stage 4 chronic kidney disease, or unspecified chronic kidney disease: Secondary | ICD-10-CM | POA: Diagnosis not present

## 2016-07-18 DIAGNOSIS — Z96651 Presence of right artificial knee joint: Secondary | ICD-10-CM | POA: Insufficient documentation

## 2016-07-18 DIAGNOSIS — I2510 Atherosclerotic heart disease of native coronary artery without angina pectoris: Secondary | ICD-10-CM | POA: Insufficient documentation

## 2016-07-18 DIAGNOSIS — R079 Chest pain, unspecified: Secondary | ICD-10-CM | POA: Diagnosis not present

## 2016-07-18 DIAGNOSIS — D649 Anemia, unspecified: Secondary | ICD-10-CM | POA: Insufficient documentation

## 2016-07-18 DIAGNOSIS — Z7982 Long term (current) use of aspirin: Secondary | ICD-10-CM | POA: Insufficient documentation

## 2016-07-18 DIAGNOSIS — N189 Chronic kidney disease, unspecified: Secondary | ICD-10-CM | POA: Insufficient documentation

## 2016-07-18 DIAGNOSIS — Z79899 Other long term (current) drug therapy: Secondary | ICD-10-CM | POA: Diagnosis not present

## 2016-07-18 DIAGNOSIS — R0602 Shortness of breath: Secondary | ICD-10-CM | POA: Diagnosis not present

## 2016-07-18 DIAGNOSIS — Z87891 Personal history of nicotine dependence: Secondary | ICD-10-CM | POA: Insufficient documentation

## 2016-07-18 DIAGNOSIS — R911 Solitary pulmonary nodule: Secondary | ICD-10-CM | POA: Diagnosis not present

## 2016-07-18 DIAGNOSIS — Z8546 Personal history of malignant neoplasm of prostate: Secondary | ICD-10-CM | POA: Diagnosis not present

## 2016-07-18 DIAGNOSIS — I509 Heart failure, unspecified: Secondary | ICD-10-CM | POA: Insufficient documentation

## 2016-07-18 DIAGNOSIS — N289 Disorder of kidney and ureter, unspecified: Secondary | ICD-10-CM | POA: Diagnosis not present

## 2016-07-18 DIAGNOSIS — J441 Chronic obstructive pulmonary disease with (acute) exacerbation: Secondary | ICD-10-CM | POA: Diagnosis not present

## 2016-07-18 DIAGNOSIS — Z955 Presence of coronary angioplasty implant and graft: Secondary | ICD-10-CM | POA: Diagnosis not present

## 2016-07-18 LAB — CBC WITH DIFFERENTIAL/PLATELET
BASOS ABS: 0 10*3/uL (ref 0.0–0.1)
BASOS PCT: 1 %
Eosinophils Absolute: 0.1 10*3/uL (ref 0.0–0.7)
Eosinophils Relative: 1 %
HEMATOCRIT: 25.2 % — AB (ref 39.0–52.0)
HEMOGLOBIN: 8.2 g/dL — AB (ref 13.0–17.0)
Lymphocytes Relative: 27 %
Lymphs Abs: 1.2 10*3/uL (ref 0.7–4.0)
MCH: 27 pg (ref 26.0–34.0)
MCHC: 32.5 g/dL (ref 30.0–36.0)
MCV: 82.9 fL (ref 78.0–100.0)
MONO ABS: 0.6 10*3/uL (ref 0.1–1.0)
Monocytes Relative: 13 %
NEUTROS ABS: 2.7 10*3/uL (ref 1.7–7.7)
NEUTROS PCT: 58 %
Platelets: 278 10*3/uL (ref 150–400)
RBC: 3.04 MIL/uL — AB (ref 4.22–5.81)
RDW: 15.6 % — AB (ref 11.5–15.5)
WBC: 4.5 10*3/uL (ref 4.0–10.5)

## 2016-07-18 LAB — COMPREHENSIVE METABOLIC PANEL
ALBUMIN: 3.5 g/dL (ref 3.5–5.0)
ALT: 12 U/L — ABNORMAL LOW (ref 17–63)
AST: 28 U/L (ref 15–41)
Alkaline Phosphatase: 86 U/L (ref 38–126)
Anion gap: 10 (ref 5–15)
BILIRUBIN TOTAL: 0.5 mg/dL (ref 0.3–1.2)
BUN: 15 mg/dL (ref 6–20)
CO2: 26 mmol/L (ref 22–32)
Calcium: 9 mg/dL (ref 8.9–10.3)
Chloride: 99 mmol/L — ABNORMAL LOW (ref 101–111)
Creatinine, Ser: 1.4 mg/dL — ABNORMAL HIGH (ref 0.61–1.24)
GFR calc Af Amer: 52 mL/min — ABNORMAL LOW (ref >60)
GFR calc non Af Amer: 45 mL/min — ABNORMAL LOW (ref >60)
GLUCOSE: 141 mg/dL — AB (ref 65–99)
POTASSIUM: 3.6 mmol/L (ref 3.5–5.1)
Sodium: 135 mmol/L (ref 135–145)
TOTAL PROTEIN: 6.3 g/dL — AB (ref 6.5–8.1)

## 2016-07-18 LAB — D-DIMER, QUANTITATIVE: D-Dimer, Quant: 1.49 ug/mL-FEU — ABNORMAL HIGH (ref 0.00–0.50)

## 2016-07-18 LAB — TROPONIN I: Troponin I: 0.04 ng/mL (ref <0.03)

## 2016-07-18 LAB — BRAIN NATRIURETIC PEPTIDE: B Natriuretic Peptide: 75.2 pg/mL (ref 0.0–100.0)

## 2016-07-18 MED ORDER — ASPIRIN 81 MG PO CHEW
324.0000 mg | CHEWABLE_TABLET | Freq: Once | ORAL | Status: AC
  Administered 2016-07-18: 324 mg via ORAL
  Filled 2016-07-18: qty 4

## 2016-07-18 MED ORDER — ASPIRIN 81 MG PO CHEW: 243.0000 mg | CHEWABLE_TABLET | Freq: Once | ORAL | Status: DC

## 2016-07-18 MED ORDER — IPRATROPIUM-ALBUTEROL 0.5-2.5 (3) MG/3ML IN SOLN
3.0000 mL | Freq: Once | RESPIRATORY_TRACT | Status: AC
  Administered 2016-07-18: 3 mL via RESPIRATORY_TRACT
  Filled 2016-07-18: qty 3

## 2016-07-18 MED ORDER — IOPAMIDOL (ISOVUE-370) INJECTION 76%
INTRAVENOUS | Status: AC
  Administered 2016-07-18: 80 mL
  Filled 2016-07-18: qty 100

## 2016-07-18 MED ORDER — ALBUTEROL SULFATE (2.5 MG/3ML) 0.083% IN NEBU
5.0000 mg | INHALATION_SOLUTION | Freq: Once | RESPIRATORY_TRACT | Status: AC
  Administered 2016-07-18: 5 mg via RESPIRATORY_TRACT
  Filled 2016-07-18: qty 6

## 2016-07-18 NOTE — ED Notes (Signed)
Pt verbalized understanding discharge instructions and denies any further needs or questions at this time. VS stable, ambulatory and steady gait.   

## 2016-07-18 NOTE — ED Notes (Signed)
Attempted to establish an IV in pt, unsuccessful.

## 2016-07-18 NOTE — ED Notes (Signed)
CRITICAL VALUE ALERT  Critical value received:  Troponin 0.04  Date of notification:  07/18/2016   Time of notification:  1140  Critical value read back:Yes.    Nurse who received alert:  Leodis Rains  MD notified (1st page):  Dr. Lenna Sciara [id number] No further orders at this time
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
## 2016-07-18 NOTE — Discharge Instructions (Signed)
Use your albuterol nebulizer every 4 hours as needed for shortness of breath. Return if needed more than every 4 hours or see Dr. Melvyn Novas or Dr. Baird Cancer. The CT scan of your chest shows a nodule in the upper portion of your left lung. Ask Dr. Baird Cancer or Dr. Melvyn Novas to order a repeat CT scan of your chest within the next 4-6 months to see if there is change. Return if you feel worse for any reason.

## 2016-07-18 NOTE — ED Triage Notes (Signed)
Patient states shortness of breath.  Patient states his doctor sent him here to check on low hgb and shortness of breath.   Patient had labs drawn at his doctors office this morning and has paperwork with blood results with him.

## 2016-07-18 NOTE — ED Notes (Signed)
Got patient undressed and on the monitor

## 2016-07-30 DIAGNOSIS — J449 Chronic obstructive pulmonary disease, unspecified: Secondary | ICD-10-CM | POA: Diagnosis not present

## 2016-08-07 DIAGNOSIS — J449 Chronic obstructive pulmonary disease, unspecified: Secondary | ICD-10-CM | POA: Diagnosis not present

## 2016-08-14 DIAGNOSIS — D649 Anemia, unspecified: Secondary | ICD-10-CM | POA: Diagnosis not present

## 2016-08-30 DIAGNOSIS — J449 Chronic obstructive pulmonary disease, unspecified: Secondary | ICD-10-CM | POA: Diagnosis not present

## 2016-09-07 DIAGNOSIS — J449 Chronic obstructive pulmonary disease, unspecified: Secondary | ICD-10-CM | POA: Diagnosis not present

## 2016-09-30 DIAGNOSIS — J449 Chronic obstructive pulmonary disease, unspecified: Secondary | ICD-10-CM | POA: Diagnosis not present

## 2016-10-08 DIAGNOSIS — J449 Chronic obstructive pulmonary disease, unspecified: Secondary | ICD-10-CM | POA: Diagnosis not present

## 2016-10-09 ENCOUNTER — Telehealth: Payer: Self-pay | Admitting: Internal Medicine

## 2016-10-09 NOTE — Telephone Encounter (Signed)
Spoke with Mechele Claude, RN with [facility]  She states that this is her second visit with the pt this month and he seems more SOB than baseline for him  I advised will need ov with all meds in hand  OV with MW at 3:15 pm tomorrow  ED sooner if wornens

## 2016-10-10 ENCOUNTER — Encounter: Payer: Self-pay | Admitting: Internal Medicine

## 2016-10-10 ENCOUNTER — Ambulatory Visit (INDEPENDENT_AMBULATORY_CARE_PROVIDER_SITE_OTHER): Payer: Medicare Other | Admitting: Internal Medicine

## 2016-10-10 VITALS — BP 102/58 | HR 75 | Ht 66.0 in | Wt 99.0 lb

## 2016-10-10 DIAGNOSIS — J441 Chronic obstructive pulmonary disease with (acute) exacerbation: Secondary | ICD-10-CM

## 2016-10-10 DIAGNOSIS — J9611 Chronic respiratory failure with hypoxia: Secondary | ICD-10-CM

## 2016-10-10 DIAGNOSIS — J9612 Chronic respiratory failure with hypercapnia: Secondary | ICD-10-CM | POA: Diagnosis not present

## 2016-10-10 MED ORDER — TIOTROPIUM BROMIDE MONOHYDRATE 2.5 MCG/ACT IN AERS: 2.0000 | INHALATION_SPRAY | Freq: Every day | RESPIRATORY_TRACT | 11 refills | Status: AC

## 2016-10-10 MED ORDER — BUDESONIDE-FORMOTEROL FUMARATE 160-4.5 MCG/ACT IN AERO: 2.0000 | INHALATION_SPRAY | Freq: Two times a day (BID) | RESPIRATORY_TRACT | 0 refills | Status: DC

## 2016-10-10 MED ORDER — TIOTROPIUM BROMIDE MONOHYDRATE 1.25 MCG/ACT IN AERS: 2.0000 | INHALATION_SPRAY | Freq: Every day | RESPIRATORY_TRACT | 0 refills | Status: DC

## 2016-10-10 MED ORDER — PREDNISONE 10 MG PO TABS: ORAL_TABLET | ORAL | 0 refills | Status: DC

## 2016-10-10 NOTE — Progress Notes (Signed)
Subjective:    Patient ID: Cristian Collins, male    DOB: 1930-01-17  MRN: [id number]   [name]/Speir  Brief patient profile:  86 yobm quit smoking 1970's due to cough/ sob got some better then started getting worse again around 2009 referred by [name] 04/07/2012 to pulmonary clinic for sob x 6 months with GOLD III COPD by pfts 05/2012    History of Present Illness  04/07/2012 1st pulmonry ov cc doe indolent onset, progressive, to point where can't do aisle at grocery store and uses [facility] parking better p proaire despite rx with advair on maint basis but finding he needs more and more proaire to get around.   rec Symbicort 160 Take 2 puffs first thing in am and then another 2 puffs about 12 hours later.  Stop advair Stay on spiriva Work on inhaler technique: . Please schedule a follow up office visit in 4 weeks, sooner if needed with pft's     01/20/2014 f/u ov/Cristian Collins re: GOLD III COPD/ now 02 dep 2lpm 24/7  Chief Complaint  Patient presents with  . Follow-up    Pt states that his breathing continues to improve. He has minimal cough with clear sputum that he relates to PND. He c/o green nasal d/c for the past several days.   symbicort/spiriva each am/ very confused with instructions/ names of meds / now has neb also and now on 02  rec Plan A = symbicort 160 Take 2 puffs first thing in am and then another 2 puffs about 12 hours later.                 spiriva also each am Plan B= backup = proair up to 2 puff every 4 hours Plan C = Nebulizer use this up to every 4 hours if Plan B doesn't work Plan D = Doctor,  Call if needing more nebulizer treatments than usual  Work on inhaler technique     Admit date: 10/07/2014 Discharge date: 10/14/2014    Recommendations for Outpatient Follow-up:  1. Follow-up with primary care physician in one week. 2. Follow-up with Dr. Melvyn Collins on 10/24/2014 at 9 AM  Discharge Diagnoses:  Active Problems:  COPD exacerbation  Protein-calorie  malnutrition, severe  Essential hypertension  CAD (coronary artery disease)  AKI (acute kidney injury)  Normocytic anemia  Anemia, iron deficiency  Acute respiratory failure with hypoxia   Discharge Condition: Stable  Diet recommendation: Heart healthy  Filed Weights   10/12/14 0500 10/13/14 0500 10/14/14 0500  Weight: 45.9 kg (101 lb 3.1 oz) 45.314 kg (99 lb 14.4 oz) 44.906 kg (99 lb)    History of present illness:  83 year old male who has a past medical history of COPD presents to the hospital with chief complaint of shortness of breath which started 1 day PTA, patient has chronic home O2 with history of COPD and CHF. He also complains of coughing up phlegm, no fever or chills. Denies any chest pain. Patient is followed by pulmonary and cardiology as outpatient. He denies nausea vomiting or diarrhea. Admits to shortness of breath on exertion.  Hospital Course:   COPD acute exacerbation - Presented with SOB, wheezing and sputum production. - Started on bronchodilators, mucolytics, antitussives and oxygen as needed. - IV antibiotics and IV steroids. - Patient developed more shortness of breath while he was in the hospital, treated with IV Lasix - Patient received a total of 7 days of IV levofloxacin. - On discharge discharged on prednisone taper and Mucinex,  outpatient follow-up with Dr. Melvyn Collins in 10 days. - Pulmonology recommended prolonged prednisone taper and keep patient on 20 mg until he sees Dr. Melvyn Collins.  Musculoskeletal pain - Continue low dose morphine prn  Iron deficiency anemia, had some dark streaks in stools last week. Hemoglobin stable around 8.5mg /dl - Continue increased iron supplementation - Occult negative - Continue iron supplementation.  Acute kidney injury due to dehydration, creatinine trended down to 1.2 with IVF, resolved. This is resolved, patient restarted on his home dose of Demadex and the time of discharge.  Hx of a-fib s/p  ablation and pacemaker placement - Tele: NSR, tele now off  Hypertension Continue amlodipine 10 mg daily  CAD - Continue statin, metoprolol, isosorbide, ASA  Hyperlipidemia Continue Zocor  Severe protein calorie malnutrition - Nutrition consult - Supplements - Liberalize diet  Leukocytosis, likely steroid-induced  Chronic respiratory failure -Patient is on 2 L of oxygen at home, on discharge he only needed 2 L of oxygen.          01/15/2016 [facility] follow up : COPD GOLD III /O2 dependent Patient presents for a post hospital follow-up. Patient was recently admitted for a COPD exacerbation, right lower lobe streptococcal pneumonia and acute on chronic hypoxic and hypercarbic respiratory failure. Patient was treated with IV antibiotics, steroids and nebulized bronchodilators. Discharged on Levaquin and a prednisone taper.has few days left on rx.   rec Continue on current regimen  Check with your family doctor to see if you have gotten your pneumonia vaccines . Pneumovax and Prevnar 13.  Follow up with Dr. Melvyn Collins  In 4-6 weeks with chest xray    02/20/2016  f/u ov/Cristian Collins re: copd III/ 02 2-3 lpm on symb/spiriva and duoneb ? Prn  Chief Complaint  Patient presents with  . Follow-up    Breathing is unchanged. No new co's today.    thinks he got prevnar 13 from [facility] Very confused about details of care  Doe baseline = MMRC3 = can't walk 100 yards even at a slow pace at a flat grade s stopping due to sob      10/10/2016  f/u ov/Cristian Collins re:  GOLD III/ 02  2-3 lpm on symb/ spiriva  Chief Complaint  Patient presents with  . Acute Visit    SOB, nurse advised him to come in, cough with mucus but hard to come up but ususlly dry  doe x MMRC3 = can't walk 100 yards even at a slow pace at a flat grade s stopping due to sob  eg HT has to stop < 1 aiisle  Both inhalers he brought with him on 0 "I have more at home" / worse breathing x one week ? Relationship to running out of  meds ?      No obvious day to day or daytime variability or assoc excess/ purulent sputum or mucus plugs or hemoptysis or cp or chest tightness, subjective wheeze or overt sinus or hb symptoms. No unusual exp hx or h/o childhood pna/ asthma or knowledge of premature birth.  Sleeping ok without nocturnal  or early am exacerbation  of respiratory  c/o's or need for noct saba. Also denies any obvious fluctuation of symptoms with weather or environmental changes or other aggravating or alleviating factors except as outlined above   Current Medications, Allergies, Complete Past Medical History, Past Surgical History, Family History, and Social History were reviewed in [employer] record.  ROS  The following are not active complaints unless bolded sore throat,  dysphagia, dental problems, itching, sneezing,  nasal congestion or excess/ purulent secretions, ear ache,   fever, chills, sweats, unintended wt loss, classically pleuritic or exertional cp,  orthopnea pnd or leg swelling, presyncope, palpitations, abdominal pain, anorexia, nausea, vomiting, diarrhea  or change in bowel or bladder habits, change in stools or urine, dysuria,hematuria,  rash, arthralgias, visual complaints, headache, numbness, weakness or ataxia or problems with walking or coordination/ walks with cane ,  change in mood/affect or memory.              Objective:   Physical Exam     thin bm nad walking with cane  Moderately Hoarse  05/11/2014       105  > 09/13/2014  103 >  10/24/2014 99 > 02/20/2016 96 > 10/10/2016   99   Vital signs reviewed - note 97% on 2lpm at rest       HEENT mild turbinate edema.  Edentulous with full dentures in place.Oropharynx no thrush or excess pnd or cobblestoning.  No JVD or cervical adenopathy. Mild accessory muscle hypertrophy. Trachea midline, nl thryroid. Chest was hyperinflated by percussion with diminished breath sounds and moderate increased exp time without wheeze.  Hoover sign positive at mid inspiration. Regular rate and rhythm without murmur gallop or rub or increase P2 or edema.  Abd: no hsm, nl excursion. Ext warm without cyanosis or clubbing.         I personally reviewed images and agree with radiology impression as follows:  CTa Chest  07/18/16 1. Nodular opacities within the superior segment of the left lower lobe and within the more caudal left lower lobe may be postinflammatory and represent subsequent scarring, but neoplasm is difficult to exclude. Recommend followup CT of the chest in 4-6 months to assess stability.

## 2016-10-10 NOTE — Patient Instructions (Signed)
Plan A = Automatic = symbiocrt 160  Take 2 puffs first thing in am and then another 2 puffs about 12 hours later plus resume spiriva (blue tip = 4 pffs, green tip = 2 pffs) each am   Plan B = Backup Only use your albuterol as a rescue medication to be used if you can't catch your breath by resting or doing a relaxed purse lip breathing pattern.  - The less you use it, the better it will work when you need it. - Ok to use the inhaler up to 2 puffs  every 4 hours if you must but call for appointment if use goes up over your usual need - Don't leave home without it !!  (think of it like the spare tire for your car)   Plan C = Crisis - only use your albuterol nebulizer if you first try Plan B and it fails to help > ok to use the nebulizer up to every 4 hours but if start needing it regularly call for immediate appointment  Prednisone 10 mg take  4 each am x 2 days,   2 each am x 2 days,  1 each am x 2 days and stop   Please schedule a follow up office visit in 6 weeks, call sooner if needed with all medications /inhalers/ solutions in hand so we can verify exactly what you are taking. This includes all medications from all doctors and over the counters

## 2016-10-13 ENCOUNTER — Encounter: Payer: Self-pay | Admitting: Internal Medicine

## 2016-10-13 NOTE — Assessment & Plan Note (Signed)
Started p admit 11/27/13 @ 2lpm  - 01/20/2014   Walked 2lpm  x one lap @ 185 stopped due to sob no desat   - 05/11/2014  Walked 2lpm pulsed  x one lap @ 185 stopped due to sob/ no desat    - 10/13/14 HC03 33  - 10/28/2014  Walked 2lpmx 3 laps @ 185 ft each stopped due to end of study, no desats  - 02/15/16  HCO3 = 31   hypercapnea has likely improved based on serial HC03 levels and 02 sats adeqate on 2lpm so no changees needed

## 2016-10-13 NOTE — Assessment & Plan Note (Addendum)
Not sure he's actually having a flare vs just ran out of meds as inhalers are all on zero and he's not aware they are empty.  - The proper method of use, as well as anticipated side effects, of a metered-dose inhaler are discussed and demonstrated to the patient. Improved effectiveness after extensive coaching during this visit to a level of approximately 90 % from a baseline of 75 % > continue hfa/respimat devices    Given samples of each and then needs to return with all meds in hand using a trust but verify approach to confirm accurate Medication  Reconciliation The principal here is that until we are certain that the  patients are doing what we've asked, it makes no sense to ask them to do more.   I had an extended discussion with the patient reviewing all relevant studies completed to date and  lasting 15 to 20 minutes of a 25 minute visit    Formulary restrictions will be an ongoing challenge for the forseable future and I would be happy to pick an alternative if the pt will first  provide me a list of them but pt  will need to return here for training for any new device that is required eg dpi vs hfa vs respimat.    In meantime we can always provide samples so the patient never runs out of any needed respiratory medications.   Each maintenance medication was reviewed in detail including most importantly the difference between maintenance and prns and under what circumstances the prns are to be triggered using an action plan format that is not reflected in the computer generated alphabetically organized AVS.    Please see AVS for specific instructions unique to this visit that I personally wrote and verbalized to the the pt in detail and then reviewed with pt  by my nurse highlighting any  changes in therapy recommended at today's visit to their plan of care.

## 2016-10-21 DIAGNOSIS — I10 Essential (primary) hypertension: Secondary | ICD-10-CM | POA: Diagnosis not present

## 2016-10-21 DIAGNOSIS — E785 Hyperlipidemia, unspecified: Secondary | ICD-10-CM | POA: Diagnosis not present

## 2016-10-21 DIAGNOSIS — E1165 Type 2 diabetes mellitus with hyperglycemia: Secondary | ICD-10-CM | POA: Diagnosis not present

## 2016-10-21 DIAGNOSIS — J449 Chronic obstructive pulmonary disease, unspecified: Secondary | ICD-10-CM | POA: Diagnosis not present

## 2016-11-05 DIAGNOSIS — J449 Chronic obstructive pulmonary disease, unspecified: Secondary | ICD-10-CM | POA: Diagnosis not present

## 2016-11-21 ENCOUNTER — Ambulatory Visit (INDEPENDENT_AMBULATORY_CARE_PROVIDER_SITE_OTHER): Payer: Medicare Other | Admitting: Internal Medicine

## 2016-11-21 ENCOUNTER — Encounter: Payer: Self-pay | Admitting: Internal Medicine

## 2016-11-21 VITALS — BP 94/58 | HR 64 | Ht 66.0 in | Wt 98.0 lb

## 2016-11-21 DIAGNOSIS — J9612 Chronic respiratory failure with hypercapnia: Secondary | ICD-10-CM

## 2016-11-21 DIAGNOSIS — J449 Chronic obstructive pulmonary disease, unspecified: Secondary | ICD-10-CM

## 2016-11-21 DIAGNOSIS — I10 Essential (primary) hypertension: Secondary | ICD-10-CM | POA: Diagnosis not present

## 2016-11-21 DIAGNOSIS — J9611 Chronic respiratory failure with hypoxia: Secondary | ICD-10-CM

## 2016-11-21 MED ORDER — ALBUTEROL SULFATE HFA 108 (90 BASE) MCG/ACT IN AERS: INHALATION_SPRAY | RESPIRATORY_TRACT | 11 refills | Status: AC

## 2016-11-21 MED ORDER — BUDESONIDE-FORMOTEROL FUMARATE 160-4.5 MCG/ACT IN AERO: 2.0000 | INHALATION_SPRAY | Freq: Two times a day (BID) | RESPIRATORY_TRACT | 0 refills | Status: AC

## 2016-11-21 NOTE — Patient Instructions (Addendum)
Plan A = Automatic = 2 samples of symbiocrt 160  =  Take 2 puffs first thing in am and then another 2 puffs about 12 hours later plus  spiriva 2 pffs each am only   Work on inhaler technique:  relax and gently blow all the way out then take a nice smooth deep breath back in, triggering the inhaler at same time you start breathing in.  Hold for up to 5 seconds if you can. Blow out thru nose. Rinse and gargle with water when done   Plan B = Backup Only use your albuterol (PROAIR)  as a rescue medication to be used if you can't catch your breath by resting or doing a relaxed purse lip breathing pattern.  - The less you use it, the better it will work when you need it. - Ok to use the inhaler up to 2 puffs  every 4 hours if you must but call for appointment if use goes up over your usual need - Don't leave home without it !!  (think of it like the spare tire for your car)   Plan C = Crisis - only use your albuterol nebulizer if you first try Plan B and it fails to help > ok to use the nebulizer up to every 4 hours but if start needing it regularly call for immediate appointment     See Tammy NP in 4 weeks with all your medications, even over the counter meds, separated in two separate bags, the ones you take no matter what vs the ones you stop once you feel better and take only as needed when you feel you need them.   Cristian Collins  will generate for you a new user friendly medication calendar that will put [place] all on the same page re: your medication use.     Without this process, it simply isn't possible to assure that we are providing  your outpatient care  with  the attention to detail we feel you deserve.   If we cannot assure that you're getting that kind of care,  then we cannot manage your problem effectively from this clinic.  Once you have seen Cristian Collins and we are sure that we're all on the same page with your medication use she will arrange follow up with me.

## 2016-11-21 NOTE — Progress Notes (Signed)
Subjective:    Patient ID: Cristian Collins, male    DOB: 1930-01-17  MRN: [id number]   [name]/Speir  Brief patient profile:  86 yobm quit smoking 1970's due to cough/ sob got some better then started getting worse again around 2009 referred by [name] 04/07/2012 to pulmonary clinic for sob x 6 months with GOLD III COPD by pfts 05/2012    History of Present Illness  04/07/2012 1st pulmonry ov cc doe indolent onset, progressive, to point where can't do aisle at grocery store and uses [facility] parking better p proaire despite rx with advair on maint basis but finding he needs more and more proaire to get around.   rec Symbicort 160 Take 2 puffs first thing in am and then another 2 puffs about 12 hours later.  Stop advair Stay on spiriva Work on inhaler technique: . Please schedule a follow up office visit in 4 weeks, sooner if needed with pft's     01/20/2014 f/u ov/Cristian Collins re: GOLD III COPD/ now 02 dep 2lpm 24/7  Chief Complaint  Patient presents with  . Follow-up    Pt states that his breathing continues to improve. He has minimal cough with clear sputum that he relates to PND. He c/o green nasal d/c for the past several days.   symbicort/spiriva each am/ very confused with instructions/ names of meds / now has neb also and now on 02  rec Plan A = symbicort 160 Take 2 puffs first thing in am and then another 2 puffs about 12 hours later.                 spiriva also each am Plan B= backup = proair up to 2 puff every 4 hours Plan C = Nebulizer use this up to every 4 hours if Plan B doesn't work Plan D = Doctor,  Call if needing more nebulizer treatments than usual  Work on inhaler technique     Admit date: 10/07/2014 Discharge date: 10/14/2014    Recommendations for Outpatient Follow-up:  1. Follow-up with primary care physician in one week. 2. Follow-up with Dr. Melvyn Novas on 10/24/2014 at 9 AM  Discharge Diagnoses:  Active Problems:  COPD exacerbation  Protein-calorie  malnutrition, severe  Essential hypertension  CAD (coronary artery disease)  AKI (acute kidney injury)  Normocytic anemia  Anemia, iron deficiency  Acute respiratory failure with hypoxia   Discharge Condition: Stable  Diet recommendation: Heart healthy  Filed Weights   10/12/14 0500 10/13/14 0500 10/14/14 0500  Weight: 45.9 kg (101 lb 3.1 oz) 45.314 kg (99 lb 14.4 oz) 44.906 kg (99 lb)    History of present illness:  83 year old male who has a past medical history of COPD presents to the hospital with chief complaint of shortness of breath which started 1 day PTA, patient has chronic home O2 with history of COPD and CHF. He also complains of coughing up phlegm, no fever or chills. Denies any chest pain. Patient is followed by pulmonary and cardiology as outpatient. He denies nausea vomiting or diarrhea. Admits to shortness of breath on exertion.  Hospital Course:   COPD acute exacerbation - Presented with SOB, wheezing and sputum production. - Started on bronchodilators, mucolytics, antitussives and oxygen as needed. - IV antibiotics and IV steroids. - Patient developed more shortness of breath while he was in the hospital, treated with IV Lasix - Patient received a total of 7 days of IV levofloxacin. - On discharge discharged on prednisone taper and Mucinex,  outpatient follow-up with Dr. Melvyn Novas in 10 days. - Pulmonology recommended prolonged prednisone taper and keep patient on 20 mg until he sees Dr. Melvyn Novas.  Musculoskeletal pain - Continue low dose morphine prn  Iron deficiency anemia, had some dark streaks in stools last week. Hemoglobin stable around 8.5mg /dl - Continue increased iron supplementation - Occult negative - Continue iron supplementation.  Acute kidney injury due to dehydration, creatinine trended down to 1.2 with IVF, resolved. This is resolved, patient restarted on his home dose of Demadex and the time of discharge.  Hx of a-fib s/p  ablation and pacemaker placement - Tele: NSR, tele now off  Hypertension Continue amlodipine 10 mg daily  CAD - Continue statin, metoprolol, isosorbide, ASA  Hyperlipidemia Continue Zocor  Severe protein calorie malnutrition - Nutrition consult - Supplements - Liberalize diet  Leukocytosis, likely steroid-induced  Chronic respiratory failure -Patient is on 2 L of oxygen at home, on discharge he only needed 2 L of oxygen.          01/15/2016 [facility] follow up : COPD GOLD III /O2 dependent Patient presents for a post hospital follow-up. Patient was recently admitted for a COPD exacerbation, right lower lobe streptococcal pneumonia and acute on chronic hypoxic and hypercarbic respiratory failure. Patient was treated with IV antibiotics, steroids and nebulized bronchodilators. Discharged on Levaquin and a prednisone taper.has few days left on rx.   rec Continue on current regimen  Check with your family doctor to see if you have gotten your pneumonia vaccines . Pneumovax and Prevnar 13.  Follow up with Dr. Melvyn Novas  In 4-6 weeks with chest xray    02/20/2016  f/u ov/Cristian Collins re: copd III/ 02 2-3 lpm on symb/spiriva and duoneb ? Prn  Chief Complaint  Patient presents with  . Follow-up    Breathing is unchanged. No new co's today.    thinks he got prevnar 13 from [facility] Very confused about details of care  Doe baseline = MMRC3 = can't walk 100 yards even at a slow pace at a flat grade s stopping due to sob      10/10/2016  f/u ov/Cristian Collins re:  GOLD III/ 02  2-3 lpm on symb/ spiriva  Chief Complaint  Patient presents with  . Acute Visit    SOB, nurse advised him to come in, cough with mucus but hard to come up but ususlly dry  doe x MMRC3 = can't walk 100 yards even at a slow pace at a flat grade s stopping due to sob  eg HT has to stop < 1 aiisle  Both inhalers he brought with him on 0 "I have more at home" / worse breathing x one week ? Relationship to running out of  meds ? rec Plan A = Automatic = symbiocrt 160  Take 2 puffs first thing in am and then another 2 puffs about 12 hours later plus resume spiriva (blue tip = 4 pffs, green tip = 2 pffs) each am  Plan B = Backup Only use your albuterol as a rescue medication Plan C = Crisis - only use your albuterol nebulizer if you first try Plan B  Prednisone 10 mg take  4 each am x 2 days,   2 each am x 2 days,  1 each am x 2 days and stop  Please schedule a follow up office visit in 6 weeks, call sooner if needed with all medications /inhalers/ solutions in hand so we can verify exactly what you are taking. This  includes all medications from all doctors and over the counters     11/21/2016  f/u ov/Cristian Collins re:  GOLD III copd/ 02 2-3lpm on symb/spiriva did not bring all meds as req (no neb solution/ empty inhalers "I have more at home" Chief Complaint  Patient presents with  . Follow-up    Cough has improved some, still having trouble producing sputum. He has had some bloody nasal d/c recently. His breathing has improved slightly.   really not clear what meds he is and is not taking/ again brought empty symbicort  Has neb but not sure how to use it  Very confused about all details of care and no one to help him with this at home apparently   No obvious day to day or daytime variability or assoc excess/ purulent sputum or mucus plugs or hemoptysis or cp or chest tightness, subjective wheeze or overt sinus or hb symptoms. No unusual exp hx or h/o childhood pna/ asthma or knowledge of premature birth.  Sleeping ok without nocturnal  or early am exacerbation  of respiratory  c/o's or need for noct saba. Also denies any obvious fluctuation of symptoms with weather or environmental changes or other aggravating or alleviating factors except as outlined above   Current Medications, Allergies, Complete Past Medical History, Past Surgical History, Family History, and Social History were reviewed in [employer] record.  ROS  The following are not active complaints unless bolded sore throat, dysphagia, dental problems, itching, sneezing,  nasal congestion or excess/ purulent secretions, ear ache,   fever, chills, sweats, unintended wt loss, classically pleuritic or exertional cp,  orthopnea pnd or leg swelling, presyncope, palpitations, abdominal pain, anorexia, nausea, vomiting, diarrhea  or change in bowel or bladder habits, change in stools or urine, dysuria,hematuria,  rash, arthralgias, visual complaints, headache, numbness, weakness or ataxia or problems with walking or coordination,  change in mood/affect or memory.                        Objective:   Physical Exam     thin bm nad walking with cane  Moderately Hoarse  05/11/2014       105  > 09/13/2014  103 >  10/24/2014 99 > 02/20/2016 96 > 10/10/2016   99   Vital signs reviewed - note 98% on 2lpm at rest note bp 94/58 but no symptoms and P 64 p am toprol      HEENT mild turbinate edema.  Edentulous with full dentures in place.Oropharynx no thrush or excess pnd or cobblestoning.  No JVD or cervical adenopathy. Mild accessory muscle hypertrophy. Trachea midline, nl thryroid. Chest was hyperinflated by percussion with diminished breath sounds and moderate increased exp time without wheeze. Hoover sign positive at mid inspiration. Regular rate and rhythm without murmur gallop or rub or increase P2 or edema.  Abd: no hsm, nl excursion. Ext warm without cyanosis or clubbing.         I personally reviewed images and agree with radiology impression as follows:  CTa Chest  07/18/16 1. Nodular opacities within the superior segment of the left lower lobe and within the more caudal left lower lobe may be postinflammatory and represent subsequent scarring, but neoplasm is difficult to exclude. Recommend followup CT of the chest in 4-6 months to assess stability.

## 2016-11-24 ENCOUNTER — Encounter: Payer: Self-pay | Admitting: Internal Medicine

## 2016-11-24 NOTE — Assessment & Plan Note (Signed)
-   PFTs 05/21/2012 0.69 (32) ratio 41 and 34% better p B2 - Sinus CT 01/07/2016 >>> No significant paranasal sinus mucosal thickening  - 11/21/2016  After extensive coaching HFA effectiveness =    75% from a baseline of 50%  Again facing major challenge re med reconciliation and making sure he has access to/ takes meds as directed so gave him samples this time of symbicort 160 x 4 weeks with f/u for med reconciliation prior to running out   To keep things simple, I have asked the patient to first separate medicines that are perceived as maintenance, that is to be taken daily "no matter what", from those medicines that are taken on only on an as-needed basis and I have given the patient examples of both, and then return to see our NP to generate a  detailed  medication calendar which should be followed until the next physician sees the patient and updates it.    I had an extended discussion with the patient reviewing all relevant studies completed to date and  lasting 15 to 20 minutes of a 25 minute visit    Each maintenance medication was reviewed in detail including most importantly the difference between maintenance and prns and under what circumstances the prns are to be triggered using an action plan format that is not reflected in the computer generated alphabetically organized AVS.    Please see AVS for specific instructions unique to this visit that I personally wrote and verbalized to the the pt in detail and then reviewed with pt  by my nurse highlighting any  changes in therapy recommended at today's visit to their plan of care.

## 2016-11-24 NOTE — Assessment & Plan Note (Signed)
Started p admit 11/27/13 @ 2lpm  - 01/20/2014   Walked 2lpm  x one lap @ 185 stopped due to sob no desat   - 05/11/2014  Walked 2lpm pulsed  x one lap @ 185 stopped due to sob/ no desat    - 10/13/14 HC03 33  - 10/28/2014  Walked 2lpm x 3 laps @ 185 ft each stopped due to end of study, no desats  - 02/15/16  HCO3 = 31   Adequate control on present rx, reviewed in detail with pt > no change in rx needed

## 2016-11-24 NOTE — Assessment & Plan Note (Signed)
May be over treated at present but since no symptoms won't plan on changing any of his bp meds until returns for med reconciliation

## 2016-11-25 ENCOUNTER — Encounter (HOSPITAL_COMMUNITY): Payer: Self-pay | Admitting: Emergency Medicine

## 2016-11-25 ENCOUNTER — Emergency Department (HOSPITAL_COMMUNITY)
Admission: EM | Admit: 2016-11-25 | Discharge: 2016-11-25 | Disposition: A | Discharge status: Home / Self Care | Payer: Medicare Other | Attending: Emergency Medicine | Admitting: Emergency Medicine

## 2016-11-25 DIAGNOSIS — Z87891 Personal history of nicotine dependence: Secondary | ICD-10-CM | POA: Insufficient documentation

## 2016-11-25 DIAGNOSIS — I509 Heart failure, unspecified: Secondary | ICD-10-CM | POA: Insufficient documentation

## 2016-11-25 DIAGNOSIS — Z955 Presence of coronary angioplasty implant and graft: Secondary | ICD-10-CM | POA: Diagnosis not present

## 2016-11-25 DIAGNOSIS — R0602 Shortness of breath: Secondary | ICD-10-CM | POA: Diagnosis not present

## 2016-11-25 DIAGNOSIS — Z79899 Other long term (current) drug therapy: Secondary | ICD-10-CM | POA: Insufficient documentation

## 2016-11-25 DIAGNOSIS — I2510 Atherosclerotic heart disease of native coronary artery without angina pectoris: Secondary | ICD-10-CM | POA: Insufficient documentation

## 2016-11-25 DIAGNOSIS — Z8546 Personal history of malignant neoplasm of prostate: Secondary | ICD-10-CM | POA: Insufficient documentation

## 2016-11-25 DIAGNOSIS — I110 Hypertensive heart disease with heart failure: Secondary | ICD-10-CM | POA: Diagnosis not present

## 2016-11-25 DIAGNOSIS — Z96651 Presence of right artificial knee joint: Secondary | ICD-10-CM | POA: Insufficient documentation

## 2016-11-25 DIAGNOSIS — Z7982 Long term (current) use of aspirin: Secondary | ICD-10-CM | POA: Insufficient documentation

## 2016-11-25 DIAGNOSIS — J449 Chronic obstructive pulmonary disease, unspecified: Secondary | ICD-10-CM | POA: Diagnosis not present
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
## 2016-11-25 NOTE — Progress Notes (Signed)
ED CM spoke with [place], he will deliver portable tank to patient prior to discharge home. CM discussed with patient and son that they will need to transport oxygen concentrator to daughter's home where patient is temporally staying, he is agreeablr    Updated D. Tamala Julian NP on [place]. No further ED CM needs identified.
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
## 2016-11-27 DIAGNOSIS — J449 Chronic obstructive pulmonary disease, unspecified: Secondary | ICD-10-CM | POA: Diagnosis not present

## 2016-11-28 DIAGNOSIS — J449 Chronic obstructive pulmonary disease, unspecified: Secondary | ICD-10-CM | POA: Diagnosis not present

## 2016-11-30 DIAGNOSIS — J449 Chronic obstructive pulmonary disease, unspecified: Secondary | ICD-10-CM | POA: Diagnosis not present

## 2016-12-04 ENCOUNTER — Telehealth: Payer: Self-pay | Admitting: Internal Medicine

## 2016-12-04 NOTE — Telephone Encounter (Signed)
Contacted pt's insurance and spent over 10 min.s talking to different people and being transferred and being placed on long holds. Left a vm with Jason at [facility] to see if he could help verify this.

## 2016-12-05 NOTE — Telephone Encounter (Signed)
Spoke with Cristian Collins who states they are still in contract with [facility] and that management team will be reaching out to [place] to inform us on more information about this. He states we do not need to switch pt to another DME at  this time. He states he will reach out to pt. Nothing further is needed

## 2016-12-05 NOTE — Telephone Encounter (Signed)
Cristian Collins returning call.Cristian Collins

## 2016-12-06 DIAGNOSIS — J449 Chronic obstructive pulmonary disease, unspecified: Secondary | ICD-10-CM | POA: Diagnosis not present

## 2016-12-20 ENCOUNTER — Encounter: Payer: Medicare Other | Admitting: Adult Health

## 2016-12-20 ENCOUNTER — Ambulatory Visit: Payer: Medicare Other | Admitting: Adult Health

## 2017-01-02 ENCOUNTER — Ambulatory Visit (INDEPENDENT_AMBULATORY_CARE_PROVIDER_SITE_OTHER): Payer: Medicare Other | Admitting: Adult Health

## 2017-01-02 ENCOUNTER — Encounter: Payer: Self-pay | Admitting: Adult Health

## 2017-01-02 VITALS — BP 130/58 | HR 57 | Ht 66.0 in | Wt 99.6 lb

## 2017-01-02 DIAGNOSIS — J449 Chronic obstructive pulmonary disease, unspecified: Secondary | ICD-10-CM

## 2017-01-02 DIAGNOSIS — J9612 Chronic respiratory failure with hypercapnia: Secondary | ICD-10-CM

## 2017-01-02 DIAGNOSIS — J9611 Chronic respiratory failure with hypoxia: Secondary | ICD-10-CM

## 2017-01-02 DIAGNOSIS — R911 Solitary pulmonary nodule: Secondary | ICD-10-CM | POA: Diagnosis not present

## 2017-01-02 NOTE — Patient Instructions (Addendum)
Set up CT chest to follow lung nodule .  Continue on Symbicort 2 puffs Twice daily   Continue on Spiriva 2 puffs daily .  Follow up with Dr. Melvyn Novas  In 2-3 months and As needed

## 2017-01-02 NOTE — Assessment & Plan Note (Signed)
Improve symptom control on current regimen Medications reviewed and MAR was updated  Plan  . Patient Instructions  Set up CT chest to follow lung nodule .  Continue on Symbicort 2 puffs Twice daily   Continue on Spiriva 2 puffs daily .  Follow up with Dr. Melvyn Novas  In 2-3 months and As needed

## 2017-01-02 NOTE — Assessment & Plan Note (Signed)
Follow up CT chest .
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
## 2017-01-02 NOTE — Assessment & Plan Note (Signed)
Cont on o2 .  

## 2017-01-03 ENCOUNTER — Emergency Department (HOSPITAL_COMMUNITY): Payer: Medicare Other

## 2017-01-03 ENCOUNTER — Encounter (HOSPITAL_COMMUNITY): Payer: Self-pay | Admitting: *Deleted

## 2017-01-03 ENCOUNTER — Inpatient Hospital Stay (HOSPITAL_COMMUNITY)
Admission: EM | Admit: 2017-01-03 | Discharge: 2017-01-10 | DRG: 190 | Disposition: A | Discharge status: Home Health | Payer: Medicare Other | Attending: Internal Medicine | Admitting: Internal Medicine

## 2017-01-03 DIAGNOSIS — Z8711 Personal history of peptic ulcer disease: Secondary | ICD-10-CM

## 2017-01-03 DIAGNOSIS — R0602 Shortness of breath: Secondary | ICD-10-CM | POA: Diagnosis not present

## 2017-01-03 DIAGNOSIS — I5031 Acute diastolic (congestive) heart failure: Secondary | ICD-10-CM | POA: Diagnosis present

## 2017-01-03 DIAGNOSIS — Z96651 Presence of right artificial knee joint: Secondary | ICD-10-CM | POA: Diagnosis not present

## 2017-01-03 DIAGNOSIS — Z515 Encounter for palliative care: Secondary | ICD-10-CM | POA: Diagnosis not present

## 2017-01-03 DIAGNOSIS — J441 Chronic obstructive pulmonary disease with (acute) exacerbation: Secondary | ICD-10-CM | POA: Diagnosis present

## 2017-01-03 DIAGNOSIS — R7989 Other specified abnormal findings of blood chemistry: Secondary | ICD-10-CM | POA: Diagnosis not present

## 2017-01-03 DIAGNOSIS — E785 Hyperlipidemia, unspecified: Secondary | ICD-10-CM | POA: Diagnosis not present

## 2017-01-03 DIAGNOSIS — J9612 Chronic respiratory failure with hypercapnia: Secondary | ICD-10-CM | POA: Diagnosis not present

## 2017-01-03 DIAGNOSIS — Z955 Presence of coronary angioplasty implant and graft: Secondary | ICD-10-CM | POA: Diagnosis not present

## 2017-01-03 DIAGNOSIS — I739 Peripheral vascular disease, unspecified: Secondary | ICD-10-CM | POA: Diagnosis present

## 2017-01-03 DIAGNOSIS — K219 Gastro-esophageal reflux disease without esophagitis: Secondary | ICD-10-CM | POA: Diagnosis present

## 2017-01-03 DIAGNOSIS — I2510 Atherosclerotic heart disease of native coronary artery without angina pectoris: Secondary | ICD-10-CM | POA: Diagnosis present

## 2017-01-03 DIAGNOSIS — J9611 Chronic respiratory failure with hypoxia: Secondary | ICD-10-CM | POA: Diagnosis present

## 2017-01-03 DIAGNOSIS — Z87891 Personal history of nicotine dependence: Secondary | ICD-10-CM

## 2017-01-03 DIAGNOSIS — F419 Anxiety disorder, unspecified: Secondary | ICD-10-CM | POA: Diagnosis present

## 2017-01-03 DIAGNOSIS — D509 Iron deficiency anemia, unspecified: Secondary | ICD-10-CM | POA: Diagnosis present

## 2017-01-03 DIAGNOSIS — R079 Chest pain, unspecified: Secondary | ICD-10-CM | POA: Diagnosis not present

## 2017-01-03 DIAGNOSIS — I2781 Cor pulmonale (chronic): Secondary | ICD-10-CM | POA: Diagnosis present

## 2017-01-03 DIAGNOSIS — E876 Hypokalemia: Secondary | ICD-10-CM | POA: Diagnosis present

## 2017-01-03 DIAGNOSIS — D649 Anemia, unspecified: Secondary | ICD-10-CM | POA: Diagnosis not present

## 2017-01-03 DIAGNOSIS — E43 Unspecified severe protein-calorie malnutrition: Secondary | ICD-10-CM | POA: Diagnosis present

## 2017-01-03 DIAGNOSIS — I2583 Coronary atherosclerosis due to lipid rich plaque: Secondary | ICD-10-CM | POA: Diagnosis not present

## 2017-01-03 DIAGNOSIS — Z7951 Long term (current) use of inhaled steroids: Secondary | ICD-10-CM

## 2017-01-03 DIAGNOSIS — Z9981 Dependence on supplemental oxygen: Secondary | ICD-10-CM | POA: Diagnosis not present

## 2017-01-03 DIAGNOSIS — I10 Essential (primary) hypertension: Secondary | ICD-10-CM | POA: Diagnosis not present

## 2017-01-03 DIAGNOSIS — Z681 Body mass index (BMI) 19 or less, adult: Secondary | ICD-10-CM

## 2017-01-03 DIAGNOSIS — R791 Abnormal coagulation profile: Secondary | ICD-10-CM | POA: Diagnosis not present

## 2017-01-03 DIAGNOSIS — R5381 Other malaise: Secondary | ICD-10-CM | POA: Diagnosis present

## 2017-01-03 DIAGNOSIS — Z79899 Other long term (current) drug therapy: Secondary | ICD-10-CM | POA: Diagnosis not present

## 2017-01-03 DIAGNOSIS — Z7982 Long term (current) use of aspirin: Secondary | ICD-10-CM

## 2017-01-03 DIAGNOSIS — Z7189 Other specified counseling: Secondary | ICD-10-CM

## 2017-01-03 DIAGNOSIS — I110 Hypertensive heart disease with heart failure: Secondary | ICD-10-CM | POA: Diagnosis present

## 2017-01-03 HISTORY — DX: Unspecified protein-calorie malnutrition: E46

## 2017-01-03 LAB — CBC WITH DIFFERENTIAL/PLATELET
BASOS PCT: 1 %
Basophils Absolute: 0 10*3/uL (ref 0.0–0.1)
EOS ABS: 0.1 10*3/uL (ref 0.0–0.7)
Eosinophils Relative: 1 %
HEMATOCRIT: 26 % — AB (ref 39.0–52.0)
HEMOGLOBIN: 8.4 g/dL — AB (ref 13.0–17.0)
LYMPHS ABS: 1 10*3/uL (ref 0.7–4.0)
Lymphocytes Relative: 28 %
MCH: 28.4 pg (ref 26.0–34.0)
MCHC: 32.3 g/dL (ref 30.0–36.0)
MCV: 87.8 fL (ref 78.0–100.0)
Monocytes Absolute: 0.3 10*3/uL (ref 0.1–1.0)
Monocytes Relative: 10 %
NEUTROS ABS: 2.2 10*3/uL (ref 1.7–7.7)
NEUTROS PCT: 60 %
Platelets: 187 10*3/uL (ref 150–400)
RBC: 2.96 MIL/uL — AB (ref 4.22–5.81)
RDW: 14.7 % (ref 11.5–15.5)
WBC: 3.5 10*3/uL — AB (ref 4.0–10.5)

## 2017-01-03 LAB — BASIC METABOLIC PANEL
ANION GAP: 7 (ref 5–15)
BUN: 10 mg/dL (ref 6–20)
CHLORIDE: 104 mmol/L (ref 101–111)
CO2: 29 mmol/L (ref 22–32)
CREATININE: 0.92 mg/dL (ref 0.61–1.24)
Calcium: 8.7 mg/dL — ABNORMAL LOW (ref 8.9–10.3)
GFR calc non Af Amer: >60 mL/min (ref >60)
Glucose, Bld: 98 mg/dL (ref 65–99)
Potassium: 3 mmol/L — ABNORMAL LOW (ref 3.5–5.1)
SODIUM: 140 mmol/L (ref 135–145)

## 2017-01-03 LAB — I-STAT TROPONIN, ED: Troponin i, poc: 0.02 ng/mL (ref 0.00–0.08)

## 2017-01-03 LAB — TROPONIN I
Troponin I: 0.04 ng/mL (ref <0.03)
Troponin I: <0.03 ng/mL (ref <0.03)
Troponin I: <0.03 ng/mL (ref <0.03)

## 2017-01-03 LAB — LIPID PANEL
CHOLESTEROL: 186 mg/dL (ref 0–200)
HDL: 82 mg/dL (ref >40)
LDL CALC: 96 mg/dL (ref 0–99)
Total CHOL/HDL Ratio: 2.3 RATIO
Triglycerides: 40 mg/dL (ref <150)
VLDL: 8 mg/dL (ref 0–40)

## 2017-01-03 LAB — MAGNESIUM: Magnesium: 2.2 mg/dL (ref 1.7–2.4)

## 2017-01-03 LAB — PHOSPHORUS: Phosphorus: 2 mg/dL — ABNORMAL LOW (ref 2.5–4.6)

## 2017-01-03 MED ORDER — TIOTROPIUM BROMIDE MONOHYDRATE 2.5 MCG/ACT IN AERS: 2.0000 | INHALATION_SPRAY | Freq: Every day | RESPIRATORY_TRACT | Status: DC

## 2017-01-03 MED ORDER — ISOSORBIDE MONONITRATE ER 30 MG PO TB24
60.0000 mg | ORAL_TABLET | Freq: Every day | ORAL | Status: DC
  Administered 2017-01-03 – 2017-01-09 (×7): 60 mg via ORAL
  Filled 2017-01-03 (×9): qty 2

## 2017-01-03 MED ORDER — POTASSIUM CHLORIDE CRYS ER 20 MEQ PO TBCR
40.0000 meq | EXTENDED_RELEASE_TABLET | Freq: Once | ORAL | Status: AC
  Administered 2017-01-03: 40 meq via ORAL
  Filled 2017-01-03: qty 4

## 2017-01-03 MED ORDER — IPRATROPIUM-ALBUTEROL 0.5-2.5 (3) MG/3ML IN SOLN
3.0000 mL | RESPIRATORY_TRACT | Status: DC
  Administered 2017-01-03: 3 mL via RESPIRATORY_TRACT
  Filled 2017-01-03: qty 3

## 2017-01-03 MED ORDER — SPIRONOLACTONE 25 MG PO TABS
25.0000 mg | ORAL_TABLET | Freq: Every day | ORAL | Status: DC
  Administered 2017-01-04 – 2017-01-10 (×7): 25 mg via ORAL
  Filled 2017-01-03 (×8): qty 1

## 2017-01-03 MED ORDER — TIOTROPIUM BROMIDE MONOHYDRATE 2.5 MCG/ACT IN AERS: 2.0000 | INHALATION_SPRAY | Freq: Every day | RESPIRATORY_TRACT | 0 refills | Status: AC

## 2017-01-03 MED ORDER — IPRATROPIUM-ALBUTEROL 0.5-2.5 (3) MG/3ML IN SOLN
3.0000 mL | Freq: Once | RESPIRATORY_TRACT | Status: AC
  Administered 2017-01-03: 3 mL via RESPIRATORY_TRACT
  Filled 2017-01-03: qty 3

## 2017-01-03 MED ORDER — DOXYCYCLINE HYCLATE 100 MG PO TABS
100.0000 mg | ORAL_TABLET | Freq: Two times a day (BID) | ORAL | Status: DC
  Administered 2017-01-03 – 2017-01-10 (×14): 100 mg via ORAL
  Filled 2017-01-03 (×14): qty 1

## 2017-01-03 MED ORDER — ONDANSETRON HCL 4 MG/2ML IJ SOLN: 4.0000 mg | Freq: Four times a day (QID) | INTRAMUSCULAR | Status: DC | PRN

## 2017-01-03 MED ORDER — POTASSIUM CHLORIDE CRYS ER 20 MEQ PO TBCR
40.0000 meq | EXTENDED_RELEASE_TABLET | Freq: Once | ORAL | Status: AC
  Administered 2017-01-03: 40 meq via ORAL
  Filled 2017-01-03: qty 2

## 2017-01-03 MED ORDER — ASPIRIN 81 MG PO CHEW
243.0000 mg | CHEWABLE_TABLET | Freq: Once | ORAL | Status: AC
  Administered 2017-01-03: 243 mg via ORAL
  Filled 2017-01-03: qty 3

## 2017-01-03 MED ORDER — METHYLPREDNISOLONE SODIUM SUCC 125 MG IJ SOLR
60.0000 mg | Freq: Two times a day (BID) | INTRAMUSCULAR | Status: DC
  Administered 2017-01-03 – 2017-01-05 (×3): 60 mg via INTRAVENOUS
  Filled 2017-01-03 (×3): qty 2

## 2017-01-03 MED ORDER — BUDESONIDE-FORMOTEROL FUMARATE 160-4.5 MCG/ACT IN AERO: 2.0000 | INHALATION_SPRAY | Freq: Two times a day (BID) | RESPIRATORY_TRACT | 0 refills | Status: AC

## 2017-01-03 MED ORDER — NITROGLYCERIN 0.4 MG SL SUBL: 0.4000 mg | SUBLINGUAL_TABLET | SUBLINGUAL | Status: DC | PRN

## 2017-01-03 MED ORDER — GUAIFENESIN-CODEINE 100-10 MG/5ML PO SOLN: 5.0000 mL | Freq: Four times a day (QID) | ORAL | Status: DC | PRN

## 2017-01-03 MED ORDER — METOPROLOL SUCCINATE ER 25 MG PO TB24
25.0000 mg | ORAL_TABLET | Freq: Every day | ORAL | Status: DC
  Administered 2017-01-04 – 2017-01-06 (×3): 25 mg via ORAL
  Filled 2017-01-03 (×3): qty 1

## 2017-01-03 MED ORDER — GUAIFENESIN ER 600 MG PO TB12
1200.0000 mg | ORAL_TABLET | Freq: Two times a day (BID) | ORAL | Status: DC
Start: 2017-01-03 — End: 2017-01-03

## 2017-01-03 MED ORDER — TRAMADOL HCL 50 MG PO TABS
50.0000 mg | ORAL_TABLET | Freq: Four times a day (QID) | ORAL | Status: DC | PRN
  Administered 2017-01-04 – 2017-01-05 (×3): 50 mg via ORAL
  Filled 2017-01-03 (×4): qty 1

## 2017-01-03 MED ORDER — MOMETASONE FURO-FORMOTEROL FUM 200-5 MCG/ACT IN AERO
2.0000 | INHALATION_SPRAY | Freq: Two times a day (BID) | RESPIRATORY_TRACT | Status: DC
  Administered 2017-01-03 – 2017-01-10 (×13): 2 via RESPIRATORY_TRACT
  Filled 2017-01-03: qty 8.8

## 2017-01-03 MED ORDER — ASPIRIN EC 325 MG PO TBEC
325.0000 mg | DELAYED_RELEASE_TABLET | Freq: Every day | ORAL | Status: DC
Start: 2017-01-04 — End: 2017-01-10
  Administered 2017-01-04 – 2017-01-10 (×7): 325 mg via ORAL
  Filled 2017-01-03 (×7): qty 1

## 2017-01-03 MED ORDER — ALBUTEROL SULFATE (2.5 MG/3ML) 0.083% IN NEBU
3.0000 mL | INHALATION_SOLUTION | RESPIRATORY_TRACT | Status: DC | PRN
  Administered 2017-01-05 – 2017-01-10 (×7): 3 mL via RESPIRATORY_TRACT
  Filled 2017-01-03 (×8): qty 3

## 2017-01-03 MED ORDER — FLUTICASONE PROPIONATE 50 MCG/ACT NA SUSP
1.0000 | Freq: Every day | NASAL | Status: DC
  Administered 2017-01-04 – 2017-01-10 (×5): 1 via NASAL
  Filled 2017-01-03: qty 16

## 2017-01-03 MED ORDER — GI COCKTAIL ~~LOC~~: 30.0000 mL | Freq: Four times a day (QID) | ORAL | Status: DC | PRN

## 2017-01-03 MED ORDER — PREDNISONE 20 MG PO TABS
60.0000 mg | ORAL_TABLET | Freq: Once | ORAL | Status: AC
  Administered 2017-01-03: 60 mg via ORAL
  Filled 2017-01-03: qty 3

## 2017-01-03 MED ORDER — SIMVASTATIN 20 MG PO TABS
20.0000 mg | ORAL_TABLET | Freq: Every day | ORAL | Status: DC
  Administered 2017-01-03 – 2017-01-10 (×8): 20 mg via ORAL
  Filled 2017-01-03 (×7): qty 1

## 2017-01-03 MED ORDER — MOMETASONE FURO-FORMOTEROL FUM 200-5 MCG/ACT IN AERO: 2.0000 | INHALATION_SPRAY | Freq: Two times a day (BID) | RESPIRATORY_TRACT | Status: DC

## 2017-01-03 MED ORDER — ENSURE ENLIVE PO LIQD
237.0000 mL | Freq: Three times a day (TID) | ORAL | Status: DC
  Administered 2017-01-04 – 2017-01-10 (×16): 237 mL via ORAL

## 2017-01-03 MED ORDER — AMLODIPINE BESYLATE 5 MG PO TABS
10.0000 mg | ORAL_TABLET | Freq: Every day | ORAL | Status: DC
  Administered 2017-01-03 – 2017-01-09 (×7): 10 mg via ORAL
  Filled 2017-01-03 (×8): qty 2

## 2017-01-03 MED ORDER — ASPIRIN EC 81 MG PO TBEC: 81.0000 mg | DELAYED_RELEASE_TABLET | Freq: Every day | ORAL | Status: DC

## 2017-01-03 MED ORDER — ACETAMINOPHEN 325 MG PO TABS
650.0000 mg | ORAL_TABLET | ORAL | Status: DC | PRN
  Administered 2017-01-04 (×3): 650 mg via ORAL
  Filled 2017-01-03 (×3): qty 2

## 2017-01-03 MED ORDER — PANTOPRAZOLE SODIUM 40 MG PO TBEC
40.0000 mg | DELAYED_RELEASE_TABLET | Freq: Every day | ORAL | Status: DC
  Administered 2017-01-04 – 2017-01-06 (×3): 40 mg via ORAL
  Filled 2017-01-03 (×3): qty 1

## 2017-01-03 MED ORDER — TORSEMIDE 20 MG PO TABS
20.0000 mg | ORAL_TABLET | ORAL | Status: DC
  Administered 2017-01-04 – 2017-01-10 (×4): 20 mg via ORAL
  Filled 2017-01-03 (×5): qty 1

## 2017-01-03 MED ORDER — ACETAMINOPHEN 325 MG PO TABS: 325.0000 mg | ORAL_TABLET | Freq: Four times a day (QID) | ORAL | Status: DC | PRN

## 2017-01-03 MED ORDER — ENOXAPARIN SODIUM 30 MG/0.3ML ~~LOC~~ SOLN
30.0000 mg | SUBCUTANEOUS | Status: DC
  Administered 2017-01-03 – 2017-01-09 (×7): 30 mg via SUBCUTANEOUS
  Filled 2017-01-03 (×7): qty 0.3

## 2017-01-03 NOTE — Addendum Note (Signed)
Addended by: Parke Poisson E on: 01/03/2017 12:54 PM   Modules accepted: Orders

## 2017-01-03 NOTE — Progress Notes (Signed)
troponin was 0.04 value called in to doctor hobbs

## 2017-01-03 NOTE — Progress Notes (Signed)
Chart and office note reviewed in detail  > agree with a/p as outlined    

## 2017-01-03 NOTE — ED Triage Notes (Signed)
Pt arrives from home via [facility]. Pt states he woke up feeling SOB and used his nebulizer machine for a few treatments and now is feeling some better. Pt was seen at his pulmonologist yesterday for the same and also responded well to the neb tx.
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
## 2017-01-03 NOTE — ED Notes (Signed)
Pt was able to ambulate without difficulty. He was speaking in full sentences throughout. Pt tolerated well.
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
## 2017-01-04 DIAGNOSIS — E785 Hyperlipidemia, unspecified: Secondary | ICD-10-CM | POA: Diagnosis present

## 2017-01-04 DIAGNOSIS — Z681 Body mass index (BMI) 19 or less, adult: Secondary | ICD-10-CM | POA: Diagnosis not present

## 2017-01-04 DIAGNOSIS — F419 Anxiety disorder, unspecified: Secondary | ICD-10-CM | POA: Diagnosis present

## 2017-01-04 DIAGNOSIS — R0602 Shortness of breath: Secondary | ICD-10-CM | POA: Diagnosis not present

## 2017-01-04 DIAGNOSIS — R791 Abnormal coagulation profile: Secondary | ICD-10-CM | POA: Diagnosis present

## 2017-01-04 DIAGNOSIS — J441 Chronic obstructive pulmonary disease with (acute) exacerbation: Secondary | ICD-10-CM | POA: Diagnosis not present

## 2017-01-04 DIAGNOSIS — I2510 Atherosclerotic heart disease of native coronary artery without angina pectoris: Secondary | ICD-10-CM | POA: Diagnosis not present

## 2017-01-04 DIAGNOSIS — E876 Hypokalemia: Secondary | ICD-10-CM | POA: Diagnosis present

## 2017-01-04 DIAGNOSIS — J9611 Chronic respiratory failure with hypoxia: Secondary | ICD-10-CM | POA: Diagnosis not present

## 2017-01-04 DIAGNOSIS — I739 Peripheral vascular disease, unspecified: Secondary | ICD-10-CM | POA: Diagnosis present

## 2017-01-04 DIAGNOSIS — D509 Iron deficiency anemia, unspecified: Secondary | ICD-10-CM | POA: Diagnosis not present

## 2017-01-04 DIAGNOSIS — Z955 Presence of coronary angioplasty implant and graft: Secondary | ICD-10-CM | POA: Diagnosis not present

## 2017-01-04 DIAGNOSIS — Z96651 Presence of right artificial knee joint: Secondary | ICD-10-CM | POA: Diagnosis present

## 2017-01-04 DIAGNOSIS — I2583 Coronary atherosclerosis due to lipid rich plaque: Secondary | ICD-10-CM | POA: Diagnosis not present

## 2017-01-04 DIAGNOSIS — D649 Anemia, unspecified: Secondary | ICD-10-CM | POA: Diagnosis not present

## 2017-01-04 DIAGNOSIS — Z515 Encounter for palliative care: Secondary | ICD-10-CM | POA: Diagnosis present

## 2017-01-04 DIAGNOSIS — I2781 Cor pulmonale (chronic): Secondary | ICD-10-CM | POA: Diagnosis present

## 2017-01-04 DIAGNOSIS — J9612 Chronic respiratory failure with hypercapnia: Secondary | ICD-10-CM | POA: Diagnosis not present

## 2017-01-04 DIAGNOSIS — R5381 Other malaise: Secondary | ICD-10-CM | POA: Diagnosis present

## 2017-01-04 DIAGNOSIS — E43 Unspecified severe protein-calorie malnutrition: Secondary | ICD-10-CM | POA: Diagnosis not present

## 2017-01-04 DIAGNOSIS — K219 Gastro-esophageal reflux disease without esophagitis: Secondary | ICD-10-CM | POA: Diagnosis present

## 2017-01-04 DIAGNOSIS — J449 Chronic obstructive pulmonary disease, unspecified: Secondary | ICD-10-CM | POA: Diagnosis not present

## 2017-01-04 DIAGNOSIS — Z9981 Dependence on supplemental oxygen: Secondary | ICD-10-CM | POA: Diagnosis not present

## 2017-01-04 DIAGNOSIS — I110 Hypertensive heart disease with heart failure: Secondary | ICD-10-CM | POA: Diagnosis present

## 2017-01-04 DIAGNOSIS — R079 Chest pain, unspecified: Secondary | ICD-10-CM | POA: Diagnosis not present

## 2017-01-04 DIAGNOSIS — Z79899 Other long term (current) drug therapy: Secondary | ICD-10-CM | POA: Diagnosis not present

## 2017-01-04 DIAGNOSIS — I5031 Acute diastolic (congestive) heart failure: Secondary | ICD-10-CM | POA: Diagnosis not present

## 2017-01-04 DIAGNOSIS — R7989 Other specified abnormal findings of blood chemistry: Secondary | ICD-10-CM | POA: Diagnosis not present

## 2017-01-04 DIAGNOSIS — I10 Essential (primary) hypertension: Secondary | ICD-10-CM | POA: Diagnosis not present

## 2017-01-04 DIAGNOSIS — Z7951 Long term (current) use of inhaled steroids: Secondary | ICD-10-CM | POA: Diagnosis not present

## 2017-01-04 LAB — BASIC METABOLIC PANEL
ANION GAP: 3 — AB (ref 5–15)
BUN: 15 mg/dL (ref 6–20)
CALCIUM: 8.7 mg/dL — AB (ref 8.9–10.3)
CO2: 30 mmol/L (ref 22–32)
Chloride: 104 mmol/L (ref 101–111)
Creatinine, Ser: 1.09 mg/dL (ref 0.61–1.24)
GFR calc Af Amer: >60 mL/min (ref >60)
GLUCOSE: 162 mg/dL — AB (ref 65–99)
Potassium: 4.5 mmol/L (ref 3.5–5.1)
SODIUM: 137 mmol/L (ref 135–145)

## 2017-01-04 LAB — CBC
HCT: 24.9 % — ABNORMAL LOW (ref 39.0–52.0)
Hemoglobin: 8 g/dL — ABNORMAL LOW (ref 13.0–17.0)
MCH: 27.9 pg (ref 26.0–34.0)
MCHC: 32.1 g/dL (ref 30.0–36.0)
MCV: 86.8 fL (ref 78.0–100.0)
Platelets: 196 10*3/uL (ref 150–400)
RBC: 2.87 MIL/uL — ABNORMAL LOW (ref 4.22–5.81)
RDW: 14.4 % (ref 11.5–15.5)
WBC: 4.7 10*3/uL (ref 4.0–10.5)

## 2017-01-04 MED ORDER — IPRATROPIUM-ALBUTEROL 0.5-2.5 (3) MG/3ML IN SOLN
3.0000 mL | Freq: Three times a day (TID) | RESPIRATORY_TRACT | Status: DC
  Administered 2017-01-04 – 2017-01-06 (×7): 3 mL via RESPIRATORY_TRACT
  Filled 2017-01-04 (×7): qty 3

## 2017-01-04 MED ORDER — ADULT MULTIVITAMIN W/MINERALS CH
1.0000 | ORAL_TABLET | Freq: Every day | ORAL | Status: DC
  Administered 2017-01-04 – 2017-01-10 (×7): 1 via ORAL
  Filled 2017-01-04 (×7): qty 1

## 2017-01-04 NOTE — Progress Notes (Signed)
PROGRESS NOTE    Cristian Collins  WFU:[id number] DOB: June 24, 1930 DOA: 01/03/2017 PCP: Glendale Chard, MD    Brief Narrative:   83 y.o. male with a Past Medical History significant for COPD, CHF, coronary artery disease who presents with sob.   Assessment & Plan:   Principal Problem:   Chest pain - Troponins flat and negative - No chest pain reported today  Active Problems:   COPD exacerbation (HCC) - Continue current medical regimen    Protein-calorie malnutrition, severe (HCC) - Continue nutritional supplementation    Essential hypertension - Continue amlodipine and metoprolol as well as Imdur   Chronic respiratory failure with hypoxia and hypercapnia (HCC)   CAD (coronary artery disease) - Continue aspirin and statin    Normocytic anemia   Anemia, iron deficiency   Hypokalemia - Resolved after replacement  DVT prophylaxis: Lovenox Code Status: Full Family Communication: Discussed with patient and spouse at bedside Disposition Plan: Pending improvement in respiratory condition   Consultants:   None   Procedures: None   Antimicrobials: Doxycycline   Subjective: The patient has no new complaints. Breathing not at baseline  Objective: Vitals:   01/04/17 0834 01/04/17 0836 01/04/17 1353 01/04/17 1509  BP:   118/67   Pulse:   64   Resp:      Temp:   97.9 F (36.6 C)   TempSrc:   Oral   SpO2: 99% 99% 100% 98%  Weight:      Height:        Intake/Output Summary (Last 24 hours) at 01/04/17 1806 Last data filed at 01/03/17 2324  Gross per 24 hour  Intake                0 ml  Output              200 ml  Net             -200 ml   Filed Weights   01/03/17 2100  Weight: 46.4 kg (102 lb 4.7 oz)    Examination:  General exam: Appears calm and comfortable , In no acute distress Respiratory system: Prolonged expiratory phase, equal chest rise, mild wheezes on expiration Cardiovascular system: S1 & S2 heard, RRR.  Gastrointestinal system: Abdomen  is nondistended, soft and nontender.  Central nervous system: Alert and oriented. No focal neurological deficits. Extremities: Symmetric 5 x 5 power. Skin: No rashes, lesions or ulcers, on limited exam Psychiatry: Mood & affect appropriate.   Data Reviewed: I have personally reviewed following labs and imaging studies  CBC:  Recent Labs Lab 01/03/17 1045 01/04/17 0220  WBC 3.5* 4.7  NEUTROABS 2.2  --   HGB 8.4* 8.0*  HCT 26.0* 24.9*  MCV 87.8 86.8  PLT 187 202   Basic Metabolic Panel:  Recent Labs Lab 01/03/17 1045 01/03/17 1557 01/04/17 0220  NA 140  --  137  K 3.0*  --  4.5  CL 104  --  104  CO2 29  --  30  GLUCOSE 98  --  162*  BUN 10  --  15  CREATININE 0.92  --  1.09  CALCIUM 8.7*  --  8.7*  MG  --  2.2  --   PHOS  --  2.0*  --    GFR: Estimated Creatinine Clearance: 33.1 mL/min (by C-G formula based on SCr of 1.09 mg/dL). Liver Function Tests: No results for input(s): AST, ALT, ALKPHOS, BILITOT, PROT, ALBUMIN in the last 168 hours. No results for  input(s): LIPASE, AMYLASE in the last 168 hours. No results for input(s): AMMONIA in the last 168 hours. Coagulation Profile: No results for input(s): INR, PROTIME in the last 168 hours. Cardiac Enzymes:  Recent Labs Lab 01/03/17 1557 01/03/17 1817 01/03/17 2145  TROPONINI 0.04* <0.03 <0.03   BNP (last 3 results) No results for input(s): PROBNP in the last 8760 hours. HbA1C: No results for input(s): HGBA1C in the last 72 hours. CBG: No results for input(s): GLUCAP in the last 168 hours. Lipid Profile:  Recent Labs  01/03/17 1817  CHOL 186  HDL 82  LDLCALC 96  TRIG 40  CHOLHDL 2.3   Thyroid Function Tests: No results for input(s): TSH, T4TOTAL, FREET4, T3FREE, THYROIDAB in the last 72 hours. Anemia Panel: No results for input(s): VITAMINB12, FOLATE, FERRITIN, TIBC, IRON, RETICCTPCT in the last 72 hours. Sepsis Labs: No results for input(s): PROCALCITON, LATICACIDVEN in the last 168  hours.  No results found for this or any previous visit (from the past 240 hour(s)).       Radiology Studies: Dg Chest 2 View  Result Date: 01/03/2017 CLINICAL DATA:  Shortness of breath EXAM: CHEST  2 VIEW COMPARISON:  07/18/2016 FINDINGS: Normal heart size and stable mediastinal contours. Coronary stent noted. COPD with hyperinflation. Calcified pleural plaque along the right diaphragm. There is no edema, consolidation, effusion, or pneumothorax. Nodular density at the left base is attributed to nipple shadow. No acute osseous finding. IMPRESSION: COPD without acute superimposed finding. Electronically Signed   By: Monte Fantasia M.D.   On: 01/03/2017 11:22        Scheduled Meds: . amLODipine  10 mg Oral Daily  . aspirin EC  325 mg Oral Daily  . doxycycline  100 mg Oral Q12H  . enoxaparin (LOVENOX) injection  30 mg Subcutaneous Q24H  . feeding supplement (ENSURE ENLIVE)  237 mL Oral TID WC  . fluticasone  1 spray Each Nare Daily  . ipratropium-albuterol  3 mL Nebulization TID  . isosorbide mononitrate  60 mg Oral Daily  . methylPREDNISolone (SOLU-MEDROL) injection  60 mg Intravenous Q12H  . metoprolol succinate  25 mg Oral Daily  . mometasone-formoterol  2 puff Inhalation BID  . multivitamin with minerals  1 tablet Oral Daily  . pantoprazole  40 mg Oral Daily  . simvastatin  20 mg Oral q1800  . spironolactone  25 mg Oral Daily  . torsemide  20 mg Oral QODAY   Continuous Infusions:   LOS: 0 days    Time spent: > 35 minutes  Velvet Bathe, MD [facility] Pager [phone]  If 7PM-7AM, please contact night-coverage [email or web address] Password [facility] 01/04/2017, 6:06 PM

## 2017-01-04 NOTE — Progress Notes (Signed)
Initial Nutrition Assessment  DOCUMENTATION CODES:   Severe malnutrition in context of chronic illness, Underweight  INTERVENTION:  - Continue Ensure Enlive TID, each supplement provides 350 kcal and 20 grams of protein - Will order daily multivitamin with minerals. - Continue to encourage PO intakes of meals and supplements.  NUTRITION DIAGNOSIS:   Malnutrition (severe) related to chronic illness (COPD) as evidenced by severe depletion of muscle mass, severe depletion of body fat.  GOAL:   Patient will meet greater than or equal to 90% of their needs  MONITOR:   PO intake, Supplement acceptance, Weight trends, Labs  REASON FOR ASSESSMENT:   Consult Assessment of nutrition requirement/status  ASSESSMENT:   83 y.o. male with medical history significant for CAD status post stents, COPD on oxygen at home, CHF presents to the emergency Department chief complaint worsening shortness of breath and chest pain. Initial evaluation reveals increased work of breathing at baseline but patient complains of chest pain. Being admitted for chest pain rule out  Pt seen for consult. BMI indicates underweight status. No intakes documented since admission. Pt reports that he has not been feeling well all morning (specifies that this is related to L flank pain and ongoing, severe SOB). He denies stomach pain or nausea. Due to feelings, he did not eat breakfast this AM but drank a few sips of coffee before it got too cold. Provided pt with a new cup of coffee per his request. Unable to obtain much other information from pt at this time as he was becoming increasingly SOB during discussion; informed pt RD will follow-up another time but encouraged him to eat and drink supplements as well as he is able. Noted that he has dentures but unsure if they fit well while eating or if he has any chewing difficulties with them in place.  Physical assessment done to upper body at this time per preference and shows  severe muscle and severe fat wasting. Per chart review, pt was stable (98-99 lbs) from 10/10/16-01/02/17 and then he subsequently gained 3 lbs from 5/24-5/25 which may be fluid related given hx of CHF.   Medications reviewed; 60 mg Solu-medrol BID, 40 mg oral Protonix/day, 40 mEq oral KCl x2 doses yesterday, 60 mg Deltasone x1 dose yesterday, 25 mg Aldactone/day. Labs reviewed; Ca: 8.7 mg/dL, Phos: 2 mg/dL.   Diet Order:  Diet Heart Room service appropriate? Yes; Fluid consistency: Thin  Skin:  Reviewed, no issues  Last BM:  5/25  Height:   Ht Readings from Last 1 Encounters:  01/03/17 5\' 6"  (1.676 m)    Weight:   Wt Readings from Last 1 Encounters:  01/03/17 102 lb 4.7 oz (46.4 kg)    Ideal Body Weight:  64.54 kg  BMI:  Body mass index is 16.51 kg/m.  Estimated Nutritional Needs:   Kcal:  1400-1625 (30-35 kcal/kg)  Protein:  55-65 grams (1.2-1.4 grams/kg)  Fluid:  >/= 1.5 L/day  EDUCATION NEEDS:   No education needs identified at this time    Jarome Matin, MS, RD, LDN, CNSC Inpatient Clinical Dietitian Pager # [phone] After hours/weekend pager # [phone]

## 2017-01-04 NOTE — Evaluation (Signed)
Physical Therapy Evaluation Patient Details Name: Cristian Collins MRN: [id number] DOB: 1930-03-17 Today's Date: 01/04/2017   History of Present Illness  Cristian Collins is a 83 y.o. male with a Past Medical History significant for COPD, CHF, coronary artery disease who presents with sob and CP.    Clinical Impression  Pt admitted with above diagnosis. Pt currently with functional limitations due to the deficits listed below (see PT Problem List). Pt was able to ambulate with cane but needed assist for LOB with challenges. Pt has RW at home and encouraged pt to use it as he will be safer.  Pt states he has 24 hour care as well.  Will follow acutely.  Pt will benefit from skilled PT to increase their independence and safety with mobility to allow discharge to the venue listed below.      Follow Up Recommendations Home health PT;Supervision/Assistance - 24 hour    Equipment Recommendations  None recommended by PT    Recommendations for Other Services       Precautions / Restrictions Precautions Precautions: Fall Restrictions Weight Bearing Restrictions: No      Mobility  Bed Mobility Overal bed mobility: Independent                Transfers Overall transfer level: Needs assistance Equipment used: Straight cane Transfers: Sit to/from Stand Sit to Stand: Modified independent (Device/Increase time)         General transfer comment: slow but can come to standing without assist.   Ambulation/Gait Ambulation/Gait assistance: Min guard Ambulation Distance (Feet): 150 Feet Assistive device: Straight cane Gait Pattern/deviations: Step-to pattern;Decreased step length - right;Decreased step length - left;Decreased stride length;Antalgic;Trunk flexed;Wide base of support   Gait velocity interpretation: Below normal speed for age/gender General Gait Details: Pt widens BOS and relies on cane in right hand.  Cannot accept perturbations or loses balance. Also when pt had no UE  support,balance worse.  Pt encouraged to use RW intially upon d/c so that pt would be safer.   Stairs            Wheelchair Mobility    Modified Rankin (Stroke Patients Only)       Balance Overall balance assessment: Needs assistance;History of Falls Sitting-balance support: No upper extremity supported;Feet supported Sitting balance-Leahy Scale: Good     Standing balance support: Single extremity supported;During functional activity;Bilateral upper extremity supported Standing balance-Leahy Scale: Poor Standing balance comment: Cannot accept challenges to balance if bil UEs not supported.  Does best with RW.              High level balance activites: Direction changes;Turns;Sudden stops High Level Balance Comments: min assist with cane with challenges.              Pertinent Vitals/Pain Pain Assessment: Faces Faces Pain Scale: Hurts little more Pain Location: abdomen Pain Descriptors / Indicators: Aching;Grimacing;Guarding Pain Intervention(s): Limited activity within patient's tolerance;Monitored during session;Repositioned  Pt on 3LO2 with sats 95% with DOE 3/4.   Home Living Family/patient expects to be discharged to:: Private residence Living Arrangements: Spouse/significant other;Children Available Help at Discharge: Family;Available 24 hours/day Type of Home: House Home Access: Stairs to enter Entrance Stairs-Rails: None Entrance Stairs-Number of Steps: 1 (half step) and a threshold Home Layout: One level Home Equipment: Walker - 2 wheels;Cane - single point;Bedside commode;Cane - quad;Grab bars - tub/shower;Shower seat (home O2 3L)      Prior Function Level of Independence: Needs assistance   Gait / Transfers Assistance Needed:  Uses cane more than RW.  RW at times outside of home.  ADL's / Homemaking Assistance Needed: Reports wife helps him bathe- sponge bathes        Hand Dominance   Dominant Hand: Right    Extremity/Trunk Assessment    Upper Extremity Assessment Upper Extremity Assessment: Defer to OT evaluation    Lower Extremity Assessment Lower Extremity Assessment: Generalized weakness    Cervical / Trunk Assessment Cervical / Trunk Assessment: Kyphotic  Communication   Communication: No difficulties  Cognition Arousal/Alertness: Awake/alert Behavior During Therapy: WFL for tasks assessed/performed Overall Cognitive Status: Within Functional Limits for tasks assessed                                        General Comments      Exercises     Assessment/Plan    PT Assessment Patient needs continued PT services  PT Problem List Decreased strength;Decreased range of motion;Decreased activity tolerance;Decreased balance;Decreased mobility;Decreased knowledge of use of DME;Pain;Decreased safety awareness;Decreased knowledge of precautions       PT Treatment Interventions DME instruction;Gait training;Functional mobility training;Stair training;Therapeutic activities;Therapeutic exercise;Balance training;Patient/family education    PT Goals (Current goals can be found in the Care Plan section)  Acute Rehab PT Goals Patient Stated Goal: to gohome PT Goal Formulation: With patient Time For Goal Achievement: 01-20-17 Potential to Achieve Goals: Good    Frequency Min 3X/week   Barriers to discharge        Co-evaluation               AM-PAC PT "6 Clicks" Daily Activity  Outcome Measure Difficulty turning over in bed (including adjusting bedclothes, sheets and blankets)?: None Difficulty moving from lying on back to sitting on the side of the bed? : None Difficulty sitting down on and standing up from a chair with arms (e.g., wheelchair, bedside commode, etc,.)?: A Little Help needed moving to and from a bed to chair (including a wheelchair)?: A Lot Help needed walking in hospital room?: A Lot Help needed climbing 3-5 steps with a railing? : A Lot 6 Click Score: 17    End  of Session Equipment Utilized During Treatment: Gait belt;Oxygen Activity Tolerance: Patient limited by fatigue;Patient limited by pain Patient left: in bed;with call bell/phone within reach Nurse Communication: Mobility status PT Visit Diagnosis: Unsteadiness on feet (R26.81);Repeated falls (R29.6);Muscle weakness (generalized) (M62.81)    Time: 3428-7681 PT Time Calculation (min) (ACUTE ONLY): 16 min   Charges:   PT Evaluation $PT Eval Moderate Complexity: 1 Procedure     PT G Codes:   PT G-Codes **NOT FOR INPATIENT CLASS** Functional Assessment Tool Used: AM-PAC 6 Clicks Basic Mobility Functional Limitation: Mobility: Walking and moving around Mobility: Walking and Moving Around Current Status (L5726): At least 40 percent but less than 60 percent impaired, limited or restricted Mobility: Walking and Moving Around Goal Status [phone]): At least 1 percent but less than 20 percent impaired, limited or restricted    [place] [phone] (pager)   Denice Paradise 01/04/2017, 4:50 PM

## 2017-01-05 DIAGNOSIS — R079 Chest pain, unspecified: Secondary | ICD-10-CM

## 2017-01-05 MED ORDER — METHYLPREDNISOLONE SODIUM SUCC 125 MG IJ SOLR
60.0000 mg | Freq: Three times a day (TID) | INTRAMUSCULAR | Status: DC
  Administered 2017-01-05 – 2017-01-06 (×3): 60 mg via INTRAVENOUS
  Filled 2017-01-05 (×3): qty 2

## 2017-01-05 NOTE — Evaluation (Signed)
Occupational Therapy Evaluation Patient Details Name: Cristian Collins MRN: [id number] DOB: Jul 07, 1930 Today's Date: 01/05/2017    History of Present Illness [name] is a 83 y.o. male with a Past Medical History significant for COPD, CHF, coronary artery disease who presents with sob and CP.     Clinical Impression   Pt admitted with above. He demonstrates the below listed deficits and will benefit from continued OT to maximize safety and independence with BADLs.  Pt presents to OT with generalized weakness, impaired balance, decreased activity tolerance.  Pt with DOE 2-3/4 and required 4 rest breaks during ADLs/eval - encouraged pursed lip breathing.  He has all DME at home, and wife is very supportive.  Feel he will progress back to supervision level with ADLs and will be able to safely discharge home with spouse.  Will follow acutely.         Follow Up Recommendations  No OT follow up;Supervision - Intermittent    Equipment Recommendations  None recommended by OT    Recommendations for Other Services       Precautions / Restrictions Precautions Precautions: Fall Restrictions Weight Bearing Restrictions: No      Mobility Bed Mobility Overal bed mobility: Independent                Transfers Overall transfer level: Needs assistance Equipment used: Rolling walker (2 wheeled) Transfers: Sit to/from [facility] Sit to Stand: Modified independent (Device/Increase time) Stand pivot transfers: Supervision            Balance Overall balance assessment: Needs assistance;History of Falls Sitting-balance support: No upper extremity supported;Feet supported Sitting balance-Leahy Scale: Good     Standing balance support: No upper extremity supported Standing balance-Leahy Scale: Fair                             ADL either performed or assessed with clinical judgement   ADL Overall ADL's : Needs  assistance/impaired Eating/Feeding: Independent   Grooming: Wash/dry hands;Wash/dry face;Oral care;Brushing hair;Min guard;Standing   Upper Body Bathing: Set up;Sitting   Lower Body Bathing: Min guard;Sit to/from stand   Upper Body Dressing : Set up;Sitting   Lower Body Dressing: Min guard;Sit to/from stand   Toilet Transfer: Min guard;Ambulation;Comfort height toilet;BSC;Grab bars;RW   Toileting- Water quality scientist and Hygiene: Min guard;Sit to/from Nurse, children's Details (indicate cue type and reason): Pt able to verbalize safe technique for tub transfer  Functional mobility during ADLs: Min guard;Rolling walker General ADL Comments: reinforced need to use RW.  Pt required 4 rest breaks during eval.  Encouraged pursed lip breathing DOE 2-3/4     Vision Baseline Vision/History: Wears glasses Wears Glasses: At all times Patient Visual Report: No change from baseline       Perception     Praxis      Pertinent Vitals/Pain Pain Assessment: No/denies pain     Hand Dominance Right   Extremity/Trunk Assessment Upper Extremity Assessment Upper Extremity Assessment: Generalized weakness   Lower Extremity Assessment Lower Extremity Assessment: Defer to PT evaluation   Cervical / Trunk Assessment Cervical / Trunk Assessment: Kyphotic   Communication Communication Communication: No difficulties   Cognition Arousal/Alertness: Awake/alert Behavior During Therapy: WFL for tasks assessed/performed Overall Cognitive Status: Within Functional Limits for tasks assessed  General Comments       Exercises     Shoulder Instructions      Home Living Family/patient expects to be discharged to:: Private residence Living Arrangements: Spouse/significant other;Children Available Help at Discharge: Family;Available 24 hours/day Type of Home: House Home Access: Stairs to enter [employer] of  Steps: 1 (half step) and a threshold Entrance Stairs-Rails: None Home Layout: One level     Bathroom Shower/Tub: Teacher, early years/pre: Standard     Home Equipment: Environmental consultant - 2 wheels;Cane - single point;Bedside commode;Cane - quad;Grab bars - tub/shower;Tub bench   Additional Comments: Pt reports he sometimes sponge bathes, sometimes showers.       Prior Functioning/Environment Level of Independence: Needs assistance  Gait / Transfers Assistance Needed: Uses cane more than RW.  RW at times outside of home. ADL's / Homemaking Assistance Needed: Pt reports he performs ADLs mod I   Comments: Pt with h/o falls         OT Problem List: Decreased strength;Decreased activity tolerance;Impaired balance (sitting and/or standing);Cardiopulmonary status limiting activity      OT Treatment/Interventions: Self-care/ADL training;Therapeutic exercise;Energy conservation;DME and/or AE instruction;Therapeutic activities;Patient/family education;Balance training    OT Goals(Current goals can be found in the care plan section) Acute Rehab OT Goals Patient Stated Goal: to gohome OT Goal Formulation: With patient Time For Goal Achievement: 01/12/17 Potential to Achieve Goals: Good ADL Goals Pt Will Perform Grooming: with supervision;standing Pt Will Perform Lower Body Bathing: with supervision;sit to/from stand Pt Will Perform Lower Body Dressing: with supervision;sit to/from stand Pt Will Transfer to Toilet: with supervision;ambulating;regular height toilet;bedside commode;grab bars Pt Will Perform Toileting - Clothing Manipulation and hygiene: with supervision;sit to/from stand Pt Will Perform Tub/Shower Transfer: with supervision;ambulating;tub bench;rolling walker  OT Frequency: Min 2X/week   Barriers to D/C:            Co-evaluation              AM-PAC PT "6 Clicks" Daily Activity     Outcome Measure Help from another person eating meals?: None Help from another  person taking care of personal grooming?: A Little Help from another person toileting, which includes using toliet, bedpan, or urinal?: A Little Help from another person bathing (including washing, rinsing, drying)?: A Little Help from another person to put on and taking off regular upper body clothing?: A Little Help from another person to put on and taking off regular lower body clothing?: A Little 6 Click Score: 19   End of Session Equipment Utilized During Treatment: Gait belt;Rolling walker;Oxygen Nurse Communication: Mobility status  Activity Tolerance: Patient tolerated treatment well Patient left: in bed;with call bell/phone within reach                   Time: 1010-1042 OT Time Calculation (min): 32 min Charges:  OT General Charges $OT Visit: 1 Procedure OT Evaluation $OT Eval Moderate Complexity: 1 Procedure OT Treatments $Self Care/Home Management : 8-22 mins G-Codes:     [facility], OTR/L [phone]   Lucille Passy M 01/05/2017, 11:52 AM

## 2017-01-05 NOTE — Progress Notes (Signed)
PROGRESS NOTE    MUNACHIMSO PALIN  AYT:[id number] DOB: 07-11-1930 DOA: 01/03/2017 PCP: Glendale Chard, MD    Brief Narrative:   83 y.o. male with a Past Medical History significant for COPD, CHF, coronary artery disease who presents with sob.   Assessment & Plan:   Principal Problem:   Chest pain - Troponins flat and negative - No chest pain reported today  Active Problems:   COPD exacerbation (HCC) - Pt still not at baseline and states he has been wheezing at time. Will increase his steroid regimen if no improvement next am will consult pulmonologist.    Protein-calorie malnutrition, severe ([place]) - Continue nutritional supplementation    Essential hypertension - Continue amlodipine and metoprolol as well as Imdur   Chronic respiratory failure with hypoxia and hypercapnia (HCC)   CAD (coronary artery disease) - Continue aspirin and statin    Normocytic anemia   Anemia, iron deficiency   Hypokalemia - Resolved after replacement  DVT prophylaxis: Lovenox Code Status: Full Family Communication: Discussed with patient and spouse at bedside Disposition Plan: Pending improvement in respiratory condition   Consultants:   None   Procedures: None   Antimicrobials: Doxycycline   Subjective: Pt states he is not at his baseline breathing status. This morning he states that his breathing situation was a little worse. When I saw him he reported feeling better. He also reports intermittent wheezing at times  Objective: Vitals:   01/04/17 2029 01/05/17 0452 01/05/17 0833 01/05/17 0835  BP: 135/66 138/61    Pulse: 69 64    Resp: (!) 24 20    Temp: 97.6 F (36.4 C) 97.6 F (36.4 C)    TempSrc: Oral Oral    SpO2: 100% 100% 99% 99%  Weight:      Height:        Intake/Output Summary (Last 24 hours) at 01/05/17 1427 Last data filed at 01/04/17 2031  Gross per 24 hour  Intake              240 ml  Output                0 ml  Net              240 ml   Filed  Weights   01/03/17 2100  Weight: 46.4 kg (102 lb 4.7 oz)    Examination:  General exam: Appears calm and comfortable , In no acute distress Respiratory system: Prolonged expiratory phase, equal chest rise, Continued mild wheezes on expiration Cardiovascular system: S1 & S2 heard, RRR.  Gastrointestinal system: Abdomen is nondistended, soft and nontender.  Central nervous system: Alert and oriented. No focal neurological deficits. Extremities: Symmetric 5 x 5 power. Skin: No rashes, lesions or ulcers, on limited exam Psychiatry: Mood & affect appropriate.   Data Reviewed: I have personally reviewed following labs and imaging studies  CBC:  Recent Labs Lab 01/03/17 1045 01/04/17 0220  WBC 3.5* 4.7  NEUTROABS 2.2  --   HGB 8.4* 8.0*  HCT 26.0* 24.9*  MCV 87.8 86.8  PLT 187 355   Basic Metabolic Panel:  Recent Labs Lab 01/03/17 1045 01/03/17 1557 01/04/17 0220  NA 140  --  137  K 3.0*  --  4.5  CL 104  --  104  CO2 29  --  30  GLUCOSE 98  --  162*  BUN 10  --  15  CREATININE 0.92  --  1.09  CALCIUM 8.7*  --  8.7*  MG  --  2.2  --   PHOS  --  2.0*  --    GFR: Estimated Creatinine Clearance: 33.1 mL/min (by C-G formula based on SCr of 1.09 mg/dL). Liver Function Tests: No results for input(s): AST, ALT, ALKPHOS, BILITOT, PROT, ALBUMIN in the last 168 hours. No results for input(s): LIPASE, AMYLASE in the last 168 hours. No results for input(s): AMMONIA in the last 168 hours. Coagulation Profile: No results for input(s): INR, PROTIME in the last 168 hours. Cardiac Enzymes:  Recent Labs Lab 01/03/17 1557 01/03/17 1817 01/03/17 2145  TROPONINI 0.04* <0.03 <0.03   BNP (last 3 results) No results for input(s): PROBNP in the last 8760 hours. HbA1C: No results for input(s): HGBA1C in the last 72 hours. CBG: No results for input(s): GLUCAP in the last 168 hours. Lipid Profile:  Recent Labs  01/03/17 1817  CHOL 186  HDL 82  LDLCALC 96  TRIG 40    CHOLHDL 2.3   Thyroid Function Tests: No results for input(s): TSH, T4TOTAL, FREET4, T3FREE, THYROIDAB in the last 72 hours. Anemia Panel: No results for input(s): VITAMINB12, FOLATE, FERRITIN, TIBC, IRON, RETICCTPCT in the last 72 hours. Sepsis Labs: No results for input(s): PROCALCITON, LATICACIDVEN in the last 168 hours.  No results found for this or any previous visit (from the past 240 hour(s)).       Radiology Studies: No results found.      Scheduled Meds: . amLODipine  10 mg Oral Daily  . aspirin EC  325 mg Oral Daily  . doxycycline  100 mg Oral Q12H  . enoxaparin (LOVENOX) injection  30 mg Subcutaneous Q24H  . feeding supplement (ENSURE ENLIVE)  237 mL Oral TID WC  . fluticasone  1 spray Each Nare Daily  . ipratropium-albuterol  3 mL Nebulization TID  . isosorbide mononitrate  60 mg Oral Daily  . methylPREDNISolone (SOLU-MEDROL) injection  60 mg Intravenous Q8H  . metoprolol succinate  25 mg Oral Daily  . mometasone-formoterol  2 puff Inhalation BID  . multivitamin with minerals  1 tablet Oral Daily  . pantoprazole  40 mg Oral Daily  . simvastatin  20 mg Oral q1800  . spironolactone  25 mg Oral Daily  . torsemide  20 mg Oral QODAY   Continuous Infusions:   LOS: 1 day    Time spent: > 35 minutes  Velvet Bathe, MD [facility] Pager [phone]  If 7PM-7AM, please contact night-coverage [email or web address] Password [facility] 01/05/2017, 2:27 PM

## 2017-01-05 NOTE — Progress Notes (Signed)
Pt suddenly woke up, confused, and anxious about his whereabouts. Patient called out complaining of difficulty breathing. Oxygen saturations 99% on 3L, RR 25, lung sounds unchanged from assessment. Pt requested PRN breathing treatment. Pt reoriented, and administered breathing treatment. Pt resting in bed at this time in no distress. Will continue to monitor. Lianne Bushy RN BSN.

## 2017-01-06 DIAGNOSIS — J9612 Chronic respiratory failure with hypercapnia: Secondary | ICD-10-CM

## 2017-01-06 DIAGNOSIS — J441 Chronic obstructive pulmonary disease with (acute) exacerbation: Principal | ICD-10-CM

## 2017-01-06 DIAGNOSIS — J9611 Chronic respiratory failure with hypoxia: Secondary | ICD-10-CM

## 2017-01-06 LAB — BRAIN NATRIURETIC PEPTIDE: B NATRIURETIC PEPTIDE 5: 308.2 pg/mL — AB (ref 0.0–100.0)

## 2017-01-06 LAB — D-DIMER, QUANTITATIVE (NOT AT ARMC): D DIMER QUANT: 1.17 ug{FEU}/mL — AB (ref 0.00–0.50)

## 2017-01-06 MED ORDER — TRAMADOL HCL 50 MG PO TABS
50.0000 mg | ORAL_TABLET | Freq: Four times a day (QID) | ORAL | Status: DC | PRN
  Administered 2017-01-06 – 2017-01-10 (×3): 50 mg via ORAL
  Filled 2017-01-06 (×3): qty 1

## 2017-01-06 MED ORDER — IPRATROPIUM-ALBUTEROL 0.5-2.5 (3) MG/3ML IN SOLN
3.0000 mL | Freq: Four times a day (QID) | RESPIRATORY_TRACT | Status: DC
  Administered 2017-01-06 – 2017-01-10 (×13): 3 mL via RESPIRATORY_TRACT
  Filled 2017-01-06 (×17): qty 3

## 2017-01-06 MED ORDER — METHYLPREDNISOLONE SODIUM SUCC 125 MG IJ SOLR
80.0000 mg | Freq: Two times a day (BID) | INTRAMUSCULAR | Status: DC
  Administered 2017-01-06 – 2017-01-07 (×2): 80 mg via INTRAVENOUS
  Filled 2017-01-06 (×2): qty 2

## 2017-01-06 MED ORDER — GUAIFENESIN ER 600 MG PO TB12
1200.0000 mg | ORAL_TABLET | Freq: Two times a day (BID) | ORAL | Status: DC
  Administered 2017-01-06 – 2017-01-10 (×9): 1200 mg via ORAL
  Filled 2017-01-06 (×9): qty 2

## 2017-01-06 MED ORDER — PANTOPRAZOLE SODIUM 40 MG PO TBEC
40.0000 mg | DELAYED_RELEASE_TABLET | Freq: Two times a day (BID) | ORAL | Status: DC
  Administered 2017-01-06 – 2017-01-10 (×9): 40 mg via ORAL
  Filled 2017-01-06 (×8): qty 1

## 2017-01-06 MED ORDER — METOPROLOL SUCCINATE ER 25 MG PO TB24
12.5000 mg | ORAL_TABLET | Freq: Two times a day (BID) | ORAL | Status: DC
  Administered 2017-01-06 – 2017-01-09 (×7): 12.5 mg via ORAL
  Filled 2017-01-06 (×8): qty 1

## 2017-01-06 NOTE — Consult Note (Addendum)
PULMONARY / CRITICAL CARE MEDICINE   Name: Cristian Collins MRN: [id number] DOB: 01/04/1930    ADMISSION DATE:  01/03/2017 CONSULTATION DATE:  01/06/17  REFERRING MD:  [facility]  CHIEF COMPLAINT:  Sob/cough   HISTORY OF PRESENT ILLNESS:   9 yobm quit smoking 1970's due to cough/ sob got some better then started getting worse again around 2009 referred by [name] 04/07/2012 to pulmonary clinic for sob x 6 months with GOLD III COPD by pfts 29/5284 complicated by chronic hypoxemia/hypercarbic resp failure / cor pulmonale  presented to the emergency Department 5/25  chief complaint worsening shortness of breath and chest pain. Initial evaluation revealed increased work of breathing at baseline but patient complains of chest pain. Being admitted for chest pain rule out  Information is obtained from the patient. He reports being seen by pulmonology NP on day PTA for his one-month follow-up. Note indicates he has been doing well and having no episodes of worsening shortness of breath. He states am of admit  he awakened with wheezing and increased work of breathing. He used nebulizers and got some relief .  CP resolved but not improving in terms of cough and sob so pccm consulted pm 5/28   No obvious patterns  day to day or daytime variability or assoc   mucus plugs or hemoptysis or   chest tightness, subjective wheeze or overt sinus or hb symptoms. No unusual exp hx or h/o childhood pna/ asthma or knowledge of premature birth.  Sleeping ok without nocturnal  or early am exacerbation  of respiratory  c/o's or need for noct saba. Also denies any obvious fluctuation of symptoms with weather or environmental changes or other aggravating or alleviating factors except as outlined above   Current Medications, Allergies, Complete Past Medical History, Past Surgical History, Family History, and Social History were reviewed in [employer] record.  ROS  The following are not active  complaints unless bolded sore throat, dysphagia, dental problems, itching, sneezing,  nasal congestion or excess/ purulent secretions, ear ache,   fever, chills, sweats, unintended wt loss, classically pleuritic or exertional cp,  orthopnea pnd or leg swelling, presyncope, palpitations, abdominal pain, anorexia, nausea, vomiting, diarrhea  or change in bowel or bladder habits, change in stools or urine, dysuria,hematuria,  rash, arthralgias, visual complaints, headache, numbness, weakness or ataxia or problems with walking or coordination,  change in mood/affect or memory.            PAST MEDICAL HISTORY :  He  has a past medical history of AKI (acute kidney injury) ([place]) (10/08/2014); Anemia; Anginal pain ([place]); Asthma; Blood dyscrasia; Chest pain; CHF (congestive heart failure) ([place]); Chronic bronchitis ([place]); COPD (chronic obstructive pulmonary disease) ([place]); Coronary artery disease; Exertional shortness of breath; Gastritis; GERD (gastroesophageal reflux disease); H/O hiatal hernia; History of hypokalemia; History of peptic ulcer disease; Hyperlipidemia; Hypertension; Hyponatremia; Migraines (1960's); Peripheral vascular disease ([place]); Pneumonia; Prostate cancer ([place]); Protein calorie malnutrition ([place]); Right knee DJD; and Urinary retention.  PAST SURGICAL HISTORY: He  has a past surgical history that includes Partial gastrectomy (1950); Prostatectomy (~ 2006); Replacement total knee (Right, 11/22/2010); Tonsillectomy (~ 1949); Appendectomy (~ 1955); Cardiac catheterization (07/2012); Coronary angioplasty with stent (03/09/2013); Joint replacement; left heart catheterization with coronary angiogram (N/A, 07/28/2012); left heart catheterization with coronary angiogram (N/A, 03/09/2013); percutaneous coronary stent intervention (pci-s) (03/09/2013); and left heart catheterization with coronary angiogram (N/A, 07/21/2013).  No Known Allergies  No current facility-administered medications on file prior  to encounter.  Current Outpatient Prescriptions on File Prior to Encounter  Medication Sig  . acetaminophen (TYLENOL) 325 MG tablet Take 325 mg by mouth every 6 (six) hours as needed for mild pain. Per bottle as needed for pain  . albuterol (PROAIR HFA) 108 (90 Base) MCG/ACT inhaler 2 puffs every 4 hours as needed only  if your can't catch your breath (Patient taking differently: Inhale 2 puffs into the lungs every 4 (four) hours as needed for shortness of breath. 2 puffs every 4 hours as needed only  if your can't catch your breath)  . albuterol (PROVENTIL) (2.5 MG/3ML) 0.083% nebulizer solution Take 3 mLs (2.5 mg total) by nebulization every 4 (four) hours as needed for shortness of breath. For shortness of breath  . amLODipine (NORVASC) 10 MG tablet Take 10 mg by mouth daily.   Marland Kitchen aspirin EC 81 MG tablet Take 81 mg by mouth daily.  . budesonide-formoterol (SYMBICORT) 160-4.5 MCG/ACT inhaler Inhale 2 puffs into the lungs 2 (two) times daily.  . budesonide-formoterol (SYMBICORT) 160-4.5 MCG/ACT inhaler Inhale 2 puffs into the lungs 2 (two) times daily.  . feeding supplement, ENSURE COMPLETE, (ENSURE COMPLETE) LIQD Take 237 mLs by mouth 3 (three) times daily between meals.  . feeding supplement, ENSURE COMPLETE, (ENSURE COMPLETE) LIQD Take 237 mLs by mouth 3 (three) times daily with meals.   Marland Kitchen guaiFENesin (MUCINEX) 600 MG 12 hr tablet Take 2 tablets (1,200 mg total) by mouth 2 (two) times daily.  Marland Kitchen guaiFENesin-codeine 100-10 MG/5ML syrup Take 5 mLs by mouth every 6 (six) hours as needed for cough.  . isosorbide mononitrate (IMDUR) 60 MG 24 hr tablet Take 60 mg by mouth daily.  . metoprolol succinate (TOPROL-XL) 25 MG 24 hr tablet Take 25 mg by mouth daily.  . pantoprazole (PROTONIX) 40 MG tablet Take 1 tablet (40 mg total) by mouth daily.  . simvastatin (ZOCOR) 20 MG tablet Take 20 mg by mouth daily.  Marland Kitchen spironolactone (ALDACTONE) 25 MG tablet Take 25 mg by mouth daily. Reported on 01/16/2016  .  Tiotropium Bromide Monohydrate (SPIRIVA RESPIMAT) 2.5 MCG/ACT AERS Inhale 2 puffs into the lungs daily.  Marland Kitchen torsemide (DEMADEX) 20 MG tablet Take 20 mg by mouth every other day.   . traMADol (ULTRAM) 50 MG tablet Take 1 tablet (50 mg total) by mouth every 6 (six) hours as needed for moderate pain.  . Vitamin D, Ergocalciferol, (DRISDOL) 50000 units CAPS capsule Take 50,000 Units by mouth 2 (two) times a week. Monday and Thursday  . nitroGLYCERIN (NITROSTAT) 0.4 MG SL tablet Place 0.4 mg under the tongue every 5 (five) minutes as needed for chest pain. For chest pain  . Tiotropium Bromide Monohydrate (SPIRIVA RESPIMAT) 2.5 MCG/ACT AERS Inhale 2 puffs into the lungs daily. (Patient not taking: Reported on 01/03/2017)    FAMILY HISTORY:  His indicated that the status of his mother is unknown. He indicated that the status of his father is unknown. He indicated that the status of his sister is unknown. He indicated that the status of his brother is unknown.    SOCIAL HISTORY: He  reports that he quit smoking about 48 years ago. His smoking use included Cigarettes. He has a 20.00 pack-year smoking history. He has never used smokeless tobacco. He reports that he does not drink alcohol or use drugs.     SUBJECTIVE:  Mild sob at rest   VITAL SIGNS: BP (!) 124/57 (BP Location: Right Arm)   Pulse 67   Temp 97.6 F (36.4 C) (  Oral)   Resp 18   Ht 5\' 6"  (1.676 m)   Wt 102 lb 4.7 oz (46.4 kg)   SpO2 100%   BMI 16.51 kg/m   HEMODYNAMICS:    VENTILATOR SETTINGS:    INTAKE / OUTPUT: I/O last 3 completed shifts: In: 240 [P.O.:240] Out: 225 [Urine:225]  PHYSICAL EXAMINATION: General:  Very frail thin elderly  bm , easily confused with details of care  Wt Readings from Last 3 Encounters:  01/03/17 102 lb 4.7 oz (46.4 kg)  01/02/17 99 lb 9.6 oz (45.2 kg)  11/21/16 98 lb (44.5 kg)    Vital signs reviewed   HEENT: nl dentition, turbinates bilaterally, and oropharynx. Nl external ear  canals without cough reflex   NECK :  without JVD/Nodes/TM/ nl carotid upstrokes bilaterally   LUNGS: Pos acc muscle use,  Barrel chest with insp and exp rhonchi bilaterally   CV:  RRR  no s3 or murmur or increase in P2, and no edema   ABD:  soft and nontender with nl inspiratory excursion in the supine position. No bruits or organomegaly appreciated, bowel sounds nl  MS:  Nl gait/ ext warm without deformities, calf tenderness, cyanosis or clubbing No obvious joint restrictions   SKIN: warm and dry without lesions    NEURO:  alert, approp, nl sensorium with  no motor or cerebellar deficits apparent.    LABS:  BMET  Recent Labs Lab 01/03/17 1045 01/04/17 0220  NA 140 137  K 3.0* 4.5  CL 104 104  CO2 29 30  BUN 10 15  CREATININE 0.92 1.09  GLUCOSE 98 162*    Electrolytes  Recent Labs Lab 01/03/17 1045 01/03/17 1557 01/04/17 0220  CALCIUM 8.7*  --  8.7*  MG  --  2.2  --   PHOS  --  2.0*  --     CBC  Recent Labs Lab 01/03/17 1045 01/04/17 0220  WBC 3.5* 4.7  HGB 8.4* 8.0*  HCT 26.0* 24.9*  PLT 187 196    Coag's No results for input(s): APTT, INR in the last 168 hours.  Sepsis Markers No results for input(s): LATICACIDVEN, PROCALCITON, O2SATVEN in the last 168 hours.  ABG No results for input(s): PHART, PCO2ART, PO2ART in the last 168 hours.  Liver Enzymes No results for input(s): AST, ALT, ALKPHOS, BILITOT, ALBUMIN in the last 168 hours.  Cardiac Enzymes  Recent Labs Lab 01/03/17 1557 01/03/17 1817 01/03/17 2145  TROPONINI 0.04* <0.03 <0.03    Glucose No results for input(s): GLUCAP in the last 168 hours.  Imaging No results found.   I personally reviewed images and agree with radiology impression as follows:  CXR:   01/03/17 Normal heart size and stable mediastinal contours. Coronary stent noted. COPD with hyperinflation. Calcified pleural plaque along the right diaphragm. There is no edema, consolidation, effusion,  or pneumothorax. Nodular density at the left base is attributed to nipple shadow. No acute osseous finding.     ASSESSMENT / PLAN:    1)  AECOPD in pt with very severe dz approaching endstage dz complicated by chronic hypoxemic resp failure and cor pulmonale  DDX of  difficult airways management almost all start with A and  include Adherence, Ace Inhibitors, Acid Reflux, Active Sinus Disease, Alpha 1 Antitripsin deficiency, Anxiety masquerading as Airways dz,  ABPA,  Allergy(esp in young), Aspiration (esp in elderly), Adverse effects of meds,  Active smokers, A bunch of PE's (a small clot burden can't cause this syndrome unless there is already  severe underlying pulm or vascular dz with poor reserve) plus two Bs  = Bronchiectasis and Beta blocker use..and one C= CHF   Adherence is always the initial "prime suspect" and is a multilayered concern that requires a "trust but verify" approach in every patient - starting with knowing how to use medications, especially inhalers, correctly, keeping up with refills and understanding the fundamental difference between maintenance and prns vs those medications only taken for a very short course and then stopped and not refilled.  - he is extremely shaky on details of care and just came in for med calendar so not much more to offer as outpt - may be approp for palliative care/ hospice referral if can't get him turned around  ? Aspiration > no hx to suggest at this point but def a concern given overall geriactric decline    ? Allergy/ asthmatic component > hold solumderol @ 80 mg IV q 12h for now   ? A bunch of PE's >  Already on lovenox so d/c pas but check Baseline D dimer    ? Beta blocker effect > unlikely on relatively low dose lopressor but will split the dose in half and give bid instead of once daily to reduce risk of spillover Beta 2 effects  ? CHF > has h/o cor pulmonale >  rec check bnp     [name], MD [facility] [place] [phone] After 5:30 PM or weekends, use [facility] [phone]

## 2017-01-06 NOTE — Progress Notes (Signed)
PROGRESS NOTE    Cristian Collins  WRU:[id number] DOB: Dec 04, 1929 DOA: 01/03/2017 PCP: Glendale Chard, MD    Brief Narrative:   83 y.o. male with a Past Medical History significant for COPD, CHF, coronary artery disease who presents with sob.  Assessment & Plan:   Principal Problem:   Chest pain - Troponins flat and negative - No chest pain reported today  Active Problems:   COPD exacerbation (HCC) - Pt still not at baseline and states he has been wheezing at time.  - increased his steroid regimen yesterday but patient still does not report improvement in respiratory condition. As such will consult pulmonology for further evaluation and recommendations.    Protein-calorie malnutrition, severe (HCC) - Continue nutritional supplementation    Essential hypertension - Continue amlodipine and metoprolol as well as Imdur   Chronic respiratory failure with hypoxia and hypercapnia (HCC)   CAD (coronary artery disease) - Continue aspirin and statin    Normocytic anemia   Anemia, iron deficiency    Hypokalemia - Resolved after replacement  DVT prophylaxis: Lovenox Code Status: Full Family Communication: Discussed with patient and spouse at bedside Disposition Plan: Pending improvement in respiratory condition   Consultants:   None   Procedures: None   Antimicrobials: Doxycycline  Subjective: Pt states he is not at his baseline breathing status. No much improvement reported today.  Objective: Vitals:   01/05/17 1930 01/05/17 2124 01/06/17 0530 01/06/17 0734  BP: (!) 126/53  135/68   Pulse: 64  60 67  Resp: 20  20 18   Temp: 97.9 F (36.6 C)  97.6 F (36.4 C)   TempSrc: Oral  Oral   SpO2: 100% 98% 100% 100%  Weight:      Height:        Intake/Output Summary (Last 24 hours) at 01/06/17 1239 Last data filed at 01/06/17 1000  Gross per 24 hour  Intake              120 ml  Output              225 ml  Net             -105 ml   Filed Weights   01/03/17 2100   Weight: 46.4 kg (102 lb 4.7 oz)    Examination:  General exam: Appears calm and comfortable , In no acute distress Respiratory system: Prolonged expiratory phase, equal chest rise Cardiovascular system: S1 & S2 heard, RRR.  Gastrointestinal system: Abdomen is nondistended, soft and nontender.  Central nervous system: Alert and oriented. No focal neurological deficits. Extremities: Symmetric 5 x 5 power. Skin: No rashes, lesions or ulcers, on limited exam Psychiatry: Mood & affect appropriate.   Data Reviewed: I have personally reviewed following labs and imaging studies  CBC:  Recent Labs Lab 01/03/17 1045 01/04/17 0220  WBC 3.5* 4.7  NEUTROABS 2.2  --   HGB 8.4* 8.0*  HCT 26.0* 24.9*  MCV 87.8 86.8  PLT 187 914   Basic Metabolic Panel:  Recent Labs Lab 01/03/17 1045 01/03/17 1557 01/04/17 0220  NA 140  --  137  K 3.0*  --  4.5  CL 104  --  104  CO2 29  --  30  GLUCOSE 98  --  162*  BUN 10  --  15  CREATININE 0.92  --  1.09  CALCIUM 8.7*  --  8.7*  MG  --  2.2  --   PHOS  --  2.0*  --  GFR: Estimated Creatinine Clearance: 33.1 mL/min (by C-G formula based on SCr of 1.09 mg/dL). Liver Function Tests: No results for input(s): AST, ALT, ALKPHOS, BILITOT, PROT, ALBUMIN in the last 168 hours. No results for input(s): LIPASE, AMYLASE in the last 168 hours. No results for input(s): AMMONIA in the last 168 hours. Coagulation Profile: No results for input(s): INR, PROTIME in the last 168 hours. Cardiac Enzymes:  Recent Labs Lab 01/03/17 1557 01/03/17 1817 01/03/17 2145  TROPONINI 0.04* <0.03 <0.03   BNP (last 3 results) No results for input(s): PROBNP in the last 8760 hours. HbA1C: No results for input(s): HGBA1C in the last 72 hours. CBG: No results for input(s): GLUCAP in the last 168 hours. Lipid Profile:  Recent Labs  01/03/17 1817  CHOL 186  HDL 82  LDLCALC 96  TRIG 40  CHOLHDL 2.3   Thyroid Function Tests: No results for input(s):  TSH, T4TOTAL, FREET4, T3FREE, THYROIDAB in the last 72 hours. Anemia Panel: No results for input(s): VITAMINB12, FOLATE, FERRITIN, TIBC, IRON, RETICCTPCT in the last 72 hours. Sepsis Labs: No results for input(s): PROCALCITON, LATICACIDVEN in the last 168 hours.  No results found for this or any previous visit (from the past 240 hour(s)).       Radiology Studies: No results found.      Scheduled Meds: . amLODipine  10 mg Oral Daily  . aspirin EC  325 mg Oral Daily  . doxycycline  100 mg Oral Q12H  . enoxaparin (LOVENOX) injection  30 mg Subcutaneous Q24H  . feeding supplement (ENSURE ENLIVE)  237 mL Oral TID WC  . fluticasone  1 spray Each Nare Daily  . ipratropium-albuterol  3 mL Nebulization TID  . isosorbide mononitrate  60 mg Oral Daily  . methylPREDNISolone (SOLU-MEDROL) injection  60 mg Intravenous Q8H  . metoprolol succinate  25 mg Oral Daily  . mometasone-formoterol  2 puff Inhalation BID  . multivitamin with minerals  1 tablet Oral Daily  . pantoprazole  40 mg Oral Daily  . simvastatin  20 mg Oral q1800  . spironolactone  25 mg Oral Daily  . torsemide  20 mg Oral QODAY   Continuous Infusions:   LOS: 2 days    Time spent: > 35 minutes  Velvet Bathe, MD [facility] Pager [phone]  If 7PM-7AM, please contact night-coverage [email or web address] Password [facility] 01/06/2017, 12:39 PM

## 2017-01-07 LAB — BASIC METABOLIC PANEL
Anion gap: 10 (ref 5–15)
BUN: 31 mg/dL — AB (ref 6–20)
CALCIUM: 8.8 mg/dL — AB (ref 8.9–10.3)
CO2: 28 mmol/L (ref 22–32)
CREATININE: 1.23 mg/dL (ref 0.61–1.24)
Chloride: 94 mmol/L — ABNORMAL LOW (ref 101–111)
GFR calc Af Amer: >60 mL/min (ref >60)
GFR, EST NON AFRICAN AMERICAN: 52 mL/min — AB (ref >60)
Glucose, Bld: 287 mg/dL — ABNORMAL HIGH (ref 65–99)
Potassium: 3.8 mmol/L (ref 3.5–5.1)
SODIUM: 132 mmol/L — AB (ref 135–145)

## 2017-01-07 MED ORDER — FUROSEMIDE 10 MG/ML IJ SOLN
20.0000 mg | Freq: Once | INTRAMUSCULAR | Status: AC
  Administered 2017-01-07: 20 mg via INTRAVENOUS
  Filled 2017-01-07: qty 2

## 2017-01-07 NOTE — Consult Note (Signed)
   [facility] CM Inpatient Consult   01/07/2017  Cristian Collins [birth year] [id number]  A telephone call was received from Tammy from [facility] from [facility] that this patient is an active participant in their Home based palliative program.  Patient admitted with a HX of COPD, HF with shortness of breath. Will follow up with inpatient RNCM.  For questions, please contact:  Natividad Brood, RN BSN [facility] Liaison  [phone] business mobile phone Toll free office [phone]

## 2017-01-07 NOTE — Progress Notes (Signed)
Occupational Therapy Treatment Patient Details Name: Cristian Collins MRN: [id number] DOB: Dec 29, 1929 Today's Date: 01/07/2017    History of present illness Cristian Collins is a 83 y.o. male with a Past Medical History significant for COPD, CHF, coronary artery disease who presents with sob and CP.     OT comments  This 83 yo male admitted with above presents to acute OT making progress with basic ADLs--now at a [facility] level ( which is his usual). He will continue to benefit from acute OT without need for follow up except for intermittent S.   Follow Up Recommendations  No OT follow up;Supervision - Intermittent    Equipment Recommendations  None recommended by OT       Precautions / Restrictions Precautions Precautions: Fall Restrictions Weight Bearing Restrictions: No       Mobility Bed Mobility Overal bed mobility: Independent                Transfers Overall transfer level: Needs assistance Equipment used: Straight cane Transfers: Sit to/from Stand Sit to Stand: Modified independent (Device/Increase time)              Balance Overall balance assessment: Needs assistance Sitting-balance support: No upper extremity supported;Feet supported Sitting balance-Leahy Scale: Good     Standing balance support: No upper extremity supported Standing balance-Leahy Scale: Fair Standing balance comment: can stand at sink for grooming without support                           ADL either performed or assessed with clinical judgement   ADL Overall ADL's : Needs assistance/impaired     Grooming: Wash/dry face;Wash/dry hands;Supervision/safety;Standing Grooming Details (indicate cue type and reason): pt min guard A to ambulate around the room with SPC to gather items             Lower Body Dressing: Supervision/safety Lower Body Dressing Details (indicate cue type and reason): minguard A for pt to ambulate around the room with [facility] to gather  items Toilet Transfer: Min guard;Ambulation;Regular Toilet;Grab bars Toilet Transfer Details (indicate cue type and reason): with [facility]                 Vision Patient Visual Report: No change from baseline            Cognition Arousal/Alertness: Awake/alert Behavior During Therapy: WFL for tasks assessed/performed Overall Cognitive Status: Within Functional Limits for tasks assessed                                                     Pertinent Vitals/ Pain       Pain Assessment: No/denies pain         Frequency  Min 2X/week        Progress Toward Goals  OT Goals(current goals can now be found in the care plan section)  Progress towards OT goals: Progressing toward goals     Plan Discharge plan remains appropriate       AM-PAC PT "6 Clicks" Daily Activity     Outcome Measure   Help from another person eating meals?: None Help from another person taking care of personal grooming?: None Help from another person toileting, which includes using toliet, bedpan, or urinal?: A Little Help from another person bathing (including washing, rinsing, drying)?:  A Little Help from another person to put on and taking off regular upper body clothing?: A Little Help from another person to put on and taking off regular lower body clothing?: A Little 6 Click Score: 20    End of Session Equipment Utilized During Treatment: Gait belt;Oxygen (SPC, 3 liters)  OT Visit Diagnosis: Unsteadiness on feet (R26.81)   Activity Tolerance Patient tolerated treatment well   Patient Left in chair;with call bell/phone within reach;with chair alarm set   Nurse Communication          Time: 3578-9784 OT Time Calculation (min): 22 min  Charges: OT General Charges $OT Visit: 1 Procedure OT Treatments $Self Care/Home Management : 8-22 mins  [address], OTR/L [phone] 01/07/2017

## 2017-01-07 NOTE — Progress Notes (Signed)
PROGRESS NOTE    Cristian Collins  ENI:[id number] DOB: 10-30-1929 DOA: 01/03/2017 PCP: Glendale Chard, MD    Brief Narrative:   83 y.o. male with a Past Medical History significant for COPD, CHF, coronary artery disease who presents with sob.  Assessment & Plan:   Principal Problem:   Chest pain - Troponins flat and negative - No chest pain reported today  Active Problems:   COPD exacerbation (HCC) - Pt still not at baseline and states he has been wheezing at time.  - Pulmonology assisting. They have recommended stopping steroids as such I will do that at this time. Patient is still short of breath and based on pulmonology's note BNP was assessed which came back elevated. As a result I will add Lasix 20 mg IV 1 and reassess BMP next a.m. as well as breathing condition.    Protein-calorie malnutrition, severe (HCC) - Continue nutritional supplementation    Essential hypertension - Continue amlodipine and metoprolol as well as Imdur   Chronic respiratory failure with hypoxia and hypercapnia (HCC)   CAD (coronary artery disease) - Continue aspirin and statin    Normocytic anemia   Anemia, iron deficiency    Hypokalemia - Resolved after replacement  DVT prophylaxis: Lovenox Code Status: Full Family Communication: Discussed with patient and spouse at bedside Disposition Plan: In breathing condition improved after Lasix would consider discharge  Consultants:   Pulmonology   Procedures: None   Antimicrobials: Doxycycline  Subjective: Pt states he is still not at his baseline breathing status.   Objective: Vitals:   01/07/17 0512 01/07/17 0746 01/07/17 1059 01/07/17 1327  BP: (!) 127/46   (!) 125/45  Pulse: 72  68 67  Resp: 17   18  Temp: 98.1 F (36.7 C)   97.5 F (36.4 C)  TempSrc: Oral   Oral  SpO2: 100% 99% 92% 100%  Weight:      Height:        Intake/Output Summary (Last 24 hours) at 01/07/17 1510 Last data filed at 01/07/17 1300  Gross per 24  hour  Intake              500 ml  Output             1900 ml  Net            -1400 ml   Filed Weights   01/03/17 2100  Weight: 46.4 kg (102 lb 4.7 oz)    Examination:  General exam: Appears calm and comfortable , In no acute distress Respiratory system: Prolonged expiratory phase, equal chest rise, Decreased breath sounds at bases Cardiovascular system: S1 & S2 heard, RRR.  Gastrointestinal system: Abdomen is nondistended, soft and nontender.  Central nervous system: Alert and oriented. No focal neurological deficits. Extremities: Symmetric 5 x 5 power. Skin: No rashes, lesions or ulcers, on limited exam Psychiatry: Mood & affect appropriate.   Data Reviewed: I have personally reviewed following labs and imaging studies  CBC:  Recent Labs Lab 01/03/17 1045 01/04/17 0220  WBC 3.5* 4.7  NEUTROABS 2.2  --   HGB 8.4* 8.0*  HCT 26.0* 24.9*  MCV 87.8 86.8  PLT 187 614   Basic Metabolic Panel:  Recent Labs Lab 01/03/17 1045 01/03/17 1557 01/04/17 0220 01/07/17 1306  NA 140  --  137 132*  K 3.0*  --  4.5 3.8  CL 104  --  104 94*  CO2 29  --  30 28  GLUCOSE 98  --  162* 287*  BUN 10  --  15 31*  CREATININE 0.92  --  1.09 1.23  CALCIUM 8.7*  --  8.7* 8.8*  MG  --  2.2  --   --   PHOS  --  2.0*  --   --    GFR: Estimated Creatinine Clearance: 29.3 mL/min (by C-G formula based on SCr of 1.23 mg/dL). Liver Function Tests: No results for input(s): AST, ALT, ALKPHOS, BILITOT, PROT, ALBUMIN in the last 168 hours. No results for input(s): LIPASE, AMYLASE in the last 168 hours. No results for input(s): AMMONIA in the last 168 hours. Coagulation Profile: No results for input(s): INR, PROTIME in the last 168 hours. Cardiac Enzymes:  Recent Labs Lab 01/03/17 1557 01/03/17 1817 01/03/17 2145  TROPONINI 0.04* <0.03 <0.03   BNP (last 3 results) No results for input(s): PROBNP in the last 8760 hours. HbA1C: No results for input(s): HGBA1C in the last 72  hours. CBG: No results for input(s): GLUCAP in the last 168 hours. Lipid Profile: No results for input(s): CHOL, HDL, LDLCALC, TRIG, CHOLHDL, LDLDIRECT in the last 72 hours. Thyroid Function Tests: No results for input(s): TSH, T4TOTAL, FREET4, T3FREE, THYROIDAB in the last 72 hours. Anemia Panel: No results for input(s): VITAMINB12, FOLATE, FERRITIN, TIBC, IRON, RETICCTPCT in the last 72 hours. Sepsis Labs: No results for input(s): PROCALCITON, LATICACIDVEN in the last 168 hours.  No results found for this or any previous visit (from the past 240 hour(s)).       Radiology Studies: No results found.      Scheduled Meds: . amLODipine  10 mg Oral Daily  . aspirin EC  325 mg Oral Daily  . doxycycline  100 mg Oral Q12H  . enoxaparin (LOVENOX) injection  30 mg Subcutaneous Q24H  . feeding supplement (ENSURE ENLIVE)  237 mL Oral TID WC  . fluticasone  1 spray Each Nare Daily  . guaiFENesin  1,200 mg Oral BID  . ipratropium-albuterol  3 mL Nebulization QID  . isosorbide mononitrate  60 mg Oral Daily  . metoprolol succinate  12.5 mg Oral BID  . mometasone-formoterol  2 puff Inhalation BID  . multivitamin with minerals  1 tablet Oral Daily  . pantoprazole  40 mg Oral BID AC  . simvastatin  20 mg Oral q1800  . spironolactone  25 mg Oral Daily  . torsemide  20 mg Oral QODAY   Continuous Infusions:   LOS: 3 days    Time spent: > 35 minutes  Velvet Bathe, MD [facility] Pager [phone]  If 7PM-7AM, please contact night-coverage [email or web address] Password [email or web address] 01/07/2017, 3:10 PM

## 2017-01-07 NOTE — Progress Notes (Signed)
Physical Therapy Treatment Patient Details Name: Cristian Collins MRN: [id number] DOB: Apr 28, 1930 Today's Date: 01/07/2017    History of Present Illness Cristian Collins is a 83 y.o. male with a Past Medical History significant for COPD, CHF, coronary artery disease who presents with sob and CP.      PT Comments    Pt very pleasant and reports he is doing much better than last week although does endorse 1/4 dyspnea. Pt on 3L throughout with sats 92-98%, HR 68. Pt without LOB with cane today but frequent fall history and RW would decrease fall risk given slow gait speed but pt denied attempting RW use today. Will continue to follow to maximize independence.    Follow Up Recommendations  Home health PT;Supervision for mobility/OOB     Equipment Recommendations  None recommended by PT    Recommendations for Other Services       Precautions / Restrictions Precautions Precautions: Fall Restrictions Weight Bearing Restrictions: No    Mobility  Bed Mobility Overal bed mobility: Independent             General bed mobility comments: in chair on arrival  Transfers Overall transfer level: Modified independent Equipment used: Straight cane Transfers: Sit to/from Stand Sit to Stand: Modified independent (Device/Increase time)         General transfer comment: pt stood from recliner and chair with armrests without assist or LOB  Ambulation/Gait Ambulation/Gait assistance: Supervision Ambulation Distance (Feet): 400 Feet Assistive device: Straight cane Gait Pattern/deviations: Step-through pattern;Decreased stride length;Trunk flexed Gait velocity: 20'/21sec=.952 Gait velocity interpretation: <1.8 ft/sec, indicative of risk for recurrent falls General Gait Details: pt with cane in left hand and able to walk slowly but safely as he denied RW use again but no overt LOB despite history of falls. pt again encouraged for RW use but he denied   Marine scientist [place] (Stroke Patients Only)       Balance Overall balance assessment: Needs assistance Sitting-balance support: No upper extremity supported;Feet supported Sitting balance-Leahy Scale: Good     Standing balance support: No upper extremity supported Standing balance-Leahy Scale: Good Standing balance comment: can stand at sink for grooming without support - able to stand and perform bathing as sink for upper body and sat for lower body and changing clothes                            Cognition Arousal/Alertness: Awake/alert Behavior During Therapy: WFL for tasks assessed/performed Overall Cognitive Status: Within Functional Limits for tasks assessed                                        Exercises      General Comments        Pertinent Vitals/Pain Pain Assessment: No/denies pain    Home Living                      Prior Function            PT Goals (current goals can now be found in the care plan section) Progress towards PT goals: Progressing toward goals    Frequency           PT Plan Current plan remains appropriate    Co-evaluation  AM-PAC PT "6 Clicks" Daily Activity  Outcome Measure  Difficulty turning over in bed (including adjusting bedclothes, sheets and blankets)?: None Difficulty moving from lying on back to sitting on the side of the bed? : None Difficulty sitting down on and standing up from a chair with arms (e.g., wheelchair, bedside commode, etc,.)?: None Help needed moving to and from a bed to chair (including a wheelchair)?: A Little Help needed walking in hospital room?: A Little Help needed climbing 3-5 steps with a railing? : A Little 6 Click Score: 21    End of Session Equipment Utilized During Treatment: Gait belt;Oxygen Activity Tolerance: Patient tolerated treatment well Patient left: in chair;with call bell/phone within reach;with  chair alarm set Nurse Communication: Mobility status PT Visit Diagnosis: Unsteadiness on feet (R26.81);Repeated falls (R29.6);Muscle weakness (generalized) (M62.81)     Time: 1829-9371 PT Time Calculation (min) (ACUTE ONLY): 31 min  Charges:  $Gait Training: 8-22 mins $Therapeutic Activity: 8-22 mins                    G Codes:       Elwyn Reach, PT [phone]    [place] 01/07/2017, 11:03 AM

## 2017-01-08 ENCOUNTER — Other Ambulatory Visit (HOSPITAL_COMMUNITY): Payer: Medicare Other

## 2017-01-08 DIAGNOSIS — R7989 Other specified abnormal findings of blood chemistry: Secondary | ICD-10-CM

## 2017-01-08 DIAGNOSIS — D649 Anemia, unspecified: Secondary | ICD-10-CM

## 2017-01-08 DIAGNOSIS — I2510 Atherosclerotic heart disease of native coronary artery without angina pectoris: Secondary | ICD-10-CM

## 2017-01-08 DIAGNOSIS — I10 Essential (primary) hypertension: Secondary | ICD-10-CM

## 2017-01-08 DIAGNOSIS — E876 Hypokalemia: Secondary | ICD-10-CM

## 2017-01-08 DIAGNOSIS — D509 Iron deficiency anemia, unspecified: Secondary | ICD-10-CM

## 2017-01-08 DIAGNOSIS — I2583 Coronary atherosclerosis due to lipid rich plaque: Secondary | ICD-10-CM

## 2017-01-08 DIAGNOSIS — E43 Unspecified severe protein-calorie malnutrition: Secondary | ICD-10-CM

## 2017-01-08 LAB — BASIC METABOLIC PANEL
ANION GAP: 6 (ref 5–15)
BUN: 29 mg/dL — ABNORMAL HIGH (ref 6–20)
CO2: 34 mmol/L — AB (ref 22–32)
Calcium: 8.5 mg/dL — ABNORMAL LOW (ref 8.9–10.3)
Chloride: 96 mmol/L — ABNORMAL LOW (ref 101–111)
Creatinine, Ser: 1.15 mg/dL (ref 0.61–1.24)
GFR, EST NON AFRICAN AMERICAN: 57 mL/min — AB (ref >60)
GLUCOSE: 105 mg/dL — AB (ref 65–99)
POTASSIUM: 3.8 mmol/L (ref 3.5–5.1)
Sodium: 136 mmol/L (ref 135–145)

## 2017-01-08 NOTE — Care Management Note (Addendum)
Case Management Note  Patient Details  Name: Cristian Collins MRN: [id number] Date of Birth: September 19, 1929  Subjective/Objective:                 Spoke with patient at the bedside. He states he has a Therapist, sports at home through [place] (followed pta through [place] for palliative services.) Describes telemedicine, not sure of other [facility] providers. Lives with wife and two sons. Patient uses oxygen through [facility].  LM with home nurse through [facility] to call back to clarify [facility] services received. [place] through [facility], no other active [place] services. Patient does endorse using [facility] in past.    Action/Plan:  6/1 spoke with patient, he states he would like to use [facility] for [facility]. CM explained they are not taking RN servoces at this time, he still wants to use them. CM reviewed with Dr Ree Kida patient's desire to not have [facility] RN in order to use [facility] with whom he is familiar. Patient is followed by [facility] and [facility] in the home, although these are less frequent RN visits. Dr Ree Kida agreeable to [facility] PT SW at this time per patient's wishes.  Expected Discharge Date:                  Expected Discharge Plan:  [place]  In-House Referral:     Discharge planning Services  CM Consult  Post Acute Care Choice:    Choice offered to:     DME Arranged:    DME Agency:     HH Arranged:    [place] Agency:     Status of Service:  In process, will continue to follow  If discussed at Long Length of Stay Meetings, dates discussed:    Additional Comments:  Carles Collet, RN 01/08/2017, 11:48 AM

## 2017-01-08 NOTE — Progress Notes (Addendum)
PROGRESS NOTE    Cristian Collins  LNL:[id number] DOB: 1929-09-01 DOA: 01/03/2017 PCP: Glendale Chard, MD   Chief Complaint  Patient presents with  . Shortness of Breath    Brief Narrative:  HPI On 01/03/2017 by Ms. [name], NP Cristian Collins is a 83 y.o. male with medical history significant for CAD status post stents, COPD on oxygen at home, CHF presents to the emergency Department chief complaint worsening shortness of breath and chest pain. Initial evaluation reveals increased work of breathing at baseline but patient complains of chest pain. Being admitted for chest pain rule out  Information is obtained from the patient. He reports being seen by pulmonology yesterday for his one-month follow-up. Note indicates he has been doing well and having no episodes of worsening shortness of breath. He states that this morning he awakened with wheezing and increased work of breathing. He uses nebulizers and got some relief. He denies headache dizziness syncope or near-syncope. He denies chest pain palpitations lower extremity edema orthopnea. He denies abdominal pain nausea vomiting dysuria hematuria frequency or urgency. He was ambulated in the hall and developed left anterior chest pain. He states the pain was intermittent felt sharp radiated to his right side.  Interim history Patient seen by pulmonary and given small dose of Lasix. Found to have an elevated BNP of 308. Have ordered echocardiogram. Patient's breathing did improve with Lasix. Assessment & Plan   COPD exacerbation with chronic use of oxygen/chronic respiratory failure with hypoxia and hypercapnia -Patient uses 3 L of home oxygen -Pulmonology consulted and appreciated -Patient does not feel his breathing is at baseline, however mildly improved from admission -Patient was given 1 dose of Lasix, breathing improved mildly -Continue doxycycline  -Will need to be transitioned back to Spiriva, albuterol, Symbicort when ready for  discharge and follow-up with Dr. Melvyn Novas. -Will add on Mucinex and Flonase to help with nasal congestion and cough  Possible heart failure -As above, BNP elevated at 308. -Patient's breathing did improve with dose of Lasix -Suspect possible heart failure from pulmonary disease -Monitor daily weights, intake/output -Continue Demadex and spironolactone -Ordered echocardiogram  Chest pain -Resolved, troponins unremarkable  Essential hypertension -Continue amlodipine, metoprolol, Imdur  Coronary artery disease -Continue metoprolol, aspirin, statin  Hypokalemia -Resolved with replacement  Chronic anemia -Baseline hemoglobin 8 and 9, currently 8 -continue to monitor CBC  Severe Protein calorie malnutrition -Continue nutritional supplements  Deconditioning -PT consulted and recommended home health  DVT Prophylaxis  Lovenox  Code Status: Full  Family Communication: None at bedside  Disposition Plan: Admitted. Discharge to home when breathing has improved.  Consultants Pulmonology  Procedures  None  Antibiotics   Anti-infectives    Start     Dose/Rate Route Frequency Ordered Stop   01/03/17 1800  doxycycline (VIBRA-TABS) tablet 100 mg     100 mg Oral Every 12 hours 01/03/17 1730        Subjective:   Cristian Collins seen and examined today.  Feels breathing is mildly improved however not back to his baseline. He feels as if oxygen is not getting into his body feels nasal congestion. Unable to cough mucus up. Denies chest pain, abdominal pain, nausea or vomiting, diarrhea or constipation  Objective:   Vitals:   01/08/17 0808 01/08/17 1025 01/08/17 1132 01/08/17 1158  BP:  (!) 132/49  (!) 110/48  Pulse:  67  72  Resp:    18  Temp:    98.2 F (36.8 C)  TempSrc:  Oral  SpO2: 100%  100% 100%  Weight:      Height:        Intake/Output Summary (Last 24 hours) at 01/08/17 1506 Last data filed at 01/08/17 1435  Gross per 24 hour  Intake              840 ml    Output             2900 ml  Net            -2060 ml   Filed Weights   01/03/17 2100 01/08/17 0200  Weight: 46.4 kg (102 lb 4.7 oz) 45.4 kg (100 lb)    Exam  General: Well developed, Thin, no apparent distress  HEENT: NCAT,  mucous membranes moist.   Cardiovascular: S1 S2 auscultated, no rubs, murmurs or gallops. Regular rate and rhythm.  Respiratory: Diminished breath sounds, no active wheezing noted.  Abdomen: Soft, nontender, nondistended, + bowel sounds  Extremities: warm dry without cyanosis clubbing or edema  Neuro: AAOx3, nonfocal  Psych: Normal affect and demeanor   Data Reviewed: I have personally reviewed following labs and imaging studies  CBC:  Recent Labs Lab 01/03/17 1045 01/04/17 0220  WBC 3.5* 4.7  NEUTROABS 2.2  --   HGB 8.4* 8.0*  HCT 26.0* 24.9*  MCV 87.8 86.8  PLT 187 573   Basic Metabolic Panel:  Recent Labs Lab 01/03/17 1045 01/03/17 1557 01/04/17 0220 01/07/17 1306 01/08/17 0251  NA 140  --  137 132* 136  K 3.0*  --  4.5 3.8 3.8  CL 104  --  104 94* 96*  CO2 29  --  30 28 34*  GLUCOSE 98  --  162* 287* 105*  BUN 10  --  15 31* 29*  CREATININE 0.92  --  1.09 1.23 1.15  CALCIUM 8.7*  --  8.7* 8.8* 8.5*  MG  --  2.2  --   --   --   PHOS  --  2.0*  --   --   --    GFR: Estimated Creatinine Clearance: 30.7 mL/min (by C-G formula based on SCr of 1.15 mg/dL). Liver Function Tests: No results for input(s): AST, ALT, ALKPHOS, BILITOT, PROT, ALBUMIN in the last 168 hours. No results for input(s): LIPASE, AMYLASE in the last 168 hours. No results for input(s): AMMONIA in the last 168 hours. Coagulation Profile: No results for input(s): INR, PROTIME in the last 168 hours. Cardiac Enzymes:  Recent Labs Lab 01/03/17 1557 01/03/17 1817 01/03/17 2145  TROPONINI 0.04* <0.03 <0.03   BNP (last 3 results) No results for input(s): PROBNP in the last 8760 hours. HbA1C: No results for input(s): HGBA1C in the last 72 hours. CBG: No  results for input(s): GLUCAP in the last 168 hours. Lipid Profile: No results for input(s): CHOL, HDL, LDLCALC, TRIG, CHOLHDL, LDLDIRECT in the last 72 hours. Thyroid Function Tests: No results for input(s): TSH, T4TOTAL, FREET4, T3FREE, THYROIDAB in the last 72 hours. Anemia Panel: No results for input(s): VITAMINB12, FOLATE, FERRITIN, TIBC, IRON, RETICCTPCT in the last 72 hours. Urine analysis:    Component Value Date/Time   COLORURINE YELLOW 02/11/2016 0029   APPEARANCEUR CLEAR 02/11/2016 0029   LABSPEC 1.015 02/11/2016 0029   PHURINE 5.0 02/11/2016 0029   GLUCOSEU NEGATIVE 02/11/2016 0029   HGBUR NEGATIVE 02/11/2016 0029   BILIRUBINUR NEGATIVE 02/11/2016 0029   KETONESUR NEGATIVE 02/11/2016 0029   PROTEINUR 30 (A) 02/11/2016 0029   UROBILINOGEN 0.2 10/08/2014 2202  NITRITE NEGATIVE 02/11/2016 0029   LEUKOCYTESUR NEGATIVE 02/11/2016 0029   Sepsis Labs: @LABRCNTIP (procalcitonin:4,lacticidven:4)  )No results found for this or any previous visit (from the past 240 hour(s)).    Radiology Studies: No results found.   Scheduled Meds: . amLODipine  10 mg Oral Daily  . aspirin EC  325 mg Oral Daily  . doxycycline  100 mg Oral Q12H  . enoxaparin (LOVENOX) injection  30 mg Subcutaneous Q24H  . feeding supplement (ENSURE ENLIVE)  237 mL Oral TID WC  . fluticasone  1 spray Each Nare Daily  . guaiFENesin  1,200 mg Oral BID  . ipratropium-albuterol  3 mL Nebulization QID  . isosorbide mononitrate  60 mg Oral Daily  . metoprolol succinate  12.5 mg Oral BID  . mometasone-formoterol  2 puff Inhalation BID  . multivitamin with minerals  1 tablet Oral Daily  . pantoprazole  40 mg Oral BID AC  . simvastatin  20 mg Oral q1800  . spironolactone  25 mg Oral Daily  . torsemide  20 mg Oral QODAY   Continuous Infusions:   LOS: 4 days   Time Spent in minutes   45 minutes  Kenta Laster D.O. on 01/08/2017 at 3:06 PM  Between 7am to 7pm - Pager - [phone]  After 7pm go to  [email or web address] - password [email or web address]  And look for the night coverage person covering for me after hours  [facility] Office  [phone]

## 2017-01-08 NOTE — Progress Notes (Signed)
PULMONARY / CRITICAL CARE MEDICINE   Name: Cristian Collins MRN: [id number] DOB: 07/23/1930    ADMISSION DATE:  01/03/2017 CONSULTATION DATE:  01/06/17  REFERRING MD:  [facility]  CHIEF COMPLAINT:  Sob/cough   HISTORY OF PRESENT ILLNESS:   83 yobm quit smoking 1970's due to cough/ sob got some better then started getting worse again around 2009 referred by [name] 04/07/2012 to pulmonary clinic for sob x 6 months with GOLD III COPD by pfts 81/9024 complicated by chronic hypoxemia/hypercarbic resp failure / cor pulmonale  presented to the emergency Department 5/25  chief complaint worsening shortness of breath and chest pain. Initial evaluation revealed increased work of breathing at baseline but patient complains of chest pain. Being admitted for chest pain rule out  Information is obtained from the patient. He reports being seen by pulmonology NP on day PTA for his one-month follow-up. Note indicates he has been doing well and having no episodes of worsening shortness of breath. He states am of admit  he awakened with wheezing and increased work of breathing. He used nebulizers and got some relief .  CP resolved but not improving in terms of cough and sob so pccm consulted pm 5/28   No obvious patterns  day to day or daytime variability or assoc   mucus plugs or hemoptysis or   chest tightness, subjective wheeze or overt sinus or hb symptoms. No unusual exp hx or h/o childhood pna/ asthma or knowledge of premature birth.  Sleeping ok without nocturnal  or early am exacerbation  of respiratory  c/o's or need for noct saba. Also denies any obvious fluctuation of symptoms with weather or environmental changes or other aggravating or alleviating factors except as outlined above       SUBJECTIVE:  Mild sob at rest . Reports being better, wakes up coughing. Reports nasal  drainge  VITAL SIGNS: BP (!) 112/50 (BP Location: Left Arm)   Pulse 60   Temp 98.1 F (36.7 C) (Oral)   Resp 18   Ht  5\' 6"  (1.676 m)   Wt 100 lb (45.4 kg)   SpO2 100%   BMI 16.14 kg/m   HEMODYNAMICS:    VENTILATOR SETTINGS: FiO2 (%):  [32 %] 32 %   Intake/Output Summary (Last 24 hours) at 01/08/17 0900 Last data filed at 01/08/17 0600  Gross per 24 hour  Intake              500 ml  Output             3000 ml  Net            -2500 ml   Filed Weights   01/03/17 2100 01/08/17 0200  Weight: 102 lb 4.7 oz (46.4 kg) 100 lb (45.4 kg)   PHYSICAL EXAMINATION: General:  Frail thin AAM with increased wob at rest HEENT:Poor dentition, no jvd/lan PSY:Ni affect,  Neuro: A&O x 3. Fascial tremor noted CV: s1s2 rrr, no m/r/gHSD HSR Pulm: decreased air movent , rapid respirations, mild accessory muscle use. 3 l [place] with sats 98  % OX:BDZH, non-tender, bsx4 active  Extremities: warm/dry, + edema  Skin: no rashes or lesions   LABS:  BMET  Recent Labs Lab 01/04/17 0220 01/07/17 1306 01/08/17 0251  NA 137 132* 136  K 4.5 3.8 3.8  CL 104 94* 96*  CO2 30 28 34*  BUN 15 31* 29*  CREATININE 1.09 1.23 1.15  GLUCOSE 162* 287* 105*    Electrolytes  Recent Labs Lab 01/03/17 1557 01/04/17 0220 01/07/17 1306 01/08/17 0251  CALCIUM  --  8.7* 8.8* 8.5*  MG 2.2  --   --   --   PHOS 2.0*  --   --   --     CBC  Recent Labs Lab 01/03/17 1045 01/04/17 0220  WBC 3.5* 4.7  HGB 8.4* 8.0*  HCT 26.0* 24.9*  PLT 187 196    Coag's No results for input(s): APTT, INR in the last 168 hours.  Sepsis Markers No results for input(s): LATICACIDVEN, PROCALCITON, O2SATVEN in the last 168 hours.  ABG No results for input(s): PHART, PCO2ART, PO2ART in the last 168 hours.  Liver Enzymes No results for input(s): AST, ALT, ALKPHOS, BILITOT, ALBUMIN in the last 168 hours.  Cardiac Enzymes  Recent Labs Lab 01/03/17 1557 01/03/17 1817 01/03/17 2145  TROPONINI 0.04* <0.03 <0.03    Glucose No results for input(s): GLUCAP in the last 168 hours.  Imaging No results found.       Intake/Output Summary (Last 24 hours) at 01/08/17 0853 Last data filed at 01/08/17 0600  Gross per 24 hour  Intake              500 ml  Output             3000 ml  Net            -2500 ml     ASSESSMENT / PLAN:    1)  AECOPD in pt with very severe dz approaching endstage dz complicated by chronic hypoxemic resp failure and cor pulmonale. Gold 3 COPD -better with diuresis -He is followed by home hospice by record but is listed as a full code. Consider addressing code status - bid PPI for ? Reflux - consider limited ct sinus -FU Dr. Melvyn Novas on DC   ? CHF > has h/o cor pulmonale, G2DD bnp 308>   Lasix 5/29 per IM with negative 2.5 litres.    Richardson Landry Minor ACNP Mary[facility] Shape PCCM Pager [phone] till 3 pm If no answer page [phone] 01/08/2017, 8:52 AM  Attending Note:  I have examined patient, reviewed labs, studies and notes. I have discussed the case with S Minor, and I agree with the data and plans as amended above. 83 yo man with severe COPD, chronic resp failure and presumed secondary PAH. Here with acute on chronic resp failure in setting AE-COPD. He is slowly improving, may have benefited some from diuresis, suggests acute on chronic R heart failure due to his pulm disease. I agree with his current management, including empiric diuresis as he can tolerate. will plan to transition back to spiriva, symbicort and albuterol when he is ready for discharge, follow w Dr Melvyn Novas. Agree that he would benefit from clarification of his [place]. apparently Hospice has been involved with his care in the past.   Baltazar Apo, MD, PhD 01/08/2017, 1:28 PM [facility] [phone] or if no answer [phone]

## 2017-01-08 NOTE — Progress Notes (Signed)
PT Cancellation Note  Patient Details Name: Cristian Collins MRN: [id number] DOB: [birth year]   Cancelled Treatment:    Reason Eval/Treat Not Completed: Other (comment). Pt eating breakfast with significant dyspnea. Will re-attempt at later time.   [place] 01/08/2017, 9:17 AM [place]

## 2017-01-08 NOTE — Care Management Important Message (Signed)
Important Message  Patient Details  Name: Cristian Collins MRN: [id number] Date of Birth: 14-May-1930   Medicare Important Message Given:  Yes    [name] [place] 01/08/2017, 11:17 AM

## 2017-01-09 ENCOUNTER — Inpatient Hospital Stay (HOSPITAL_COMMUNITY): Payer: Medicare Other

## 2017-01-09 DIAGNOSIS — R7989 Other specified abnormal findings of blood chemistry: Secondary | ICD-10-CM

## 2017-01-09 LAB — BASIC METABOLIC PANEL
ANION GAP: 6 (ref 5–15)
BUN: 20 mg/dL (ref 6–20)
CALCIUM: 8.4 mg/dL — AB (ref 8.9–10.3)
CO2: 34 mmol/L — AB (ref 22–32)
Chloride: 94 mmol/L — ABNORMAL LOW (ref 101–111)
Creatinine, Ser: 1.18 mg/dL (ref 0.61–1.24)
GFR, EST NON AFRICAN AMERICAN: 55 mL/min — AB (ref >60)
GLUCOSE: 97 mg/dL (ref 65–99)
Potassium: 3.9 mmol/L (ref 3.5–5.1)
Sodium: 134 mmol/L — ABNORMAL LOW (ref 135–145)

## 2017-01-09 NOTE — Progress Notes (Signed)
Nutrition Follow Up  DOCUMENTATION CODES:   Severe malnutrition in context of chronic illness, Underweight  INTERVENTION:    Continue Ensure Enlive po BID, each supplement provides 350 kcal and 20 grams of protein  NUTRITION DIAGNOSIS:   Malnutrition (severe) related to chronic illness (COPD) as evidenced by severe depletion of muscle mass, severe depletion of body fat, ongoing  GOAL:   Patient will meet greater than or equal to 90% of their needs, met  MONITOR:   PO intake, Supplement acceptance, Weight trends, Labs  ASSESSMENT:   83 y.o. male with medical history significant for CAD status post stents, COPD on oxygen at home, CHF presents to the emergency Department chief complaint worsening shortness of breath and chest pain. Initial evaluation reveals increased work of breathing at baseline but patient complains of chest pain. Being admitted for chest pain rule out  Pt admitted with COPD exacerbation. S/p bilateral lower extremity venous duplex. PO intake variable at 25-100% per flowsheet records. Receiving Ensure Enlive nutrition supplements TID and MVI daily. Medications reviewed.  Labs reviewed.  Sodium 134 (L).  Diet Order:  Diet Heart Room service appropriate? Yes; Fluid consistency: Thin  Skin:  Reviewed, no issues  Last BM:  5/30  Height:   Ht Readings from Last 1 Encounters:  01/03/17 '5\' 6"'$  (1.676 m)   Weight:   Wt Readings from Last 1 Encounters:  01/09/17 109 lb 5.6 oz (49.6 kg)   Ideal Body Weight:  64.54 kg  BMI:  Body mass index is 17.65 kg/m.  Estimated Nutritional Needs:   Kcal:  1400-1600  Protein:  60-70 gm  Fluid:  >/= 1.5 L  EDUCATION NEEDS:   No education needs identified at this time  Arthur Holms, RD, LDN Pager #: [phone] After-Hours Pager #: [phone]

## 2017-01-09 NOTE — Progress Notes (Signed)
Physical Therapy Treatment Patient Details Name: Cristian Collins MRN: [id number] DOB: [birth year] Today's Date: 01/09/2017    History of Present Illness Cristian Collins is a 83 y.o. male with a Past Medical History significant for COPD, CHF, coronary artery disease who presents with sob and CP.      PT Comments    Pt very pleasant and moving well. Pt slow and steady with use of cane today but unable to increase gait distance. Pt reports he was walking 1 city block PTA with standing rests. Pt fatigued after gait and only able to perform limited HEP. Encouraged walking with nursing as well as daily HEP. Will continue to follow to return pt to baseline.   HR 70 sats 95% on 3L    Follow Up Recommendations  Home health PT     Equipment Recommendations  None recommended by PT    Recommendations for Other Services       Precautions / Restrictions Precautions Precautions: None Restrictions Weight Bearing Restrictions: No    Mobility  Bed Mobility Overal bed mobility: Independent                Transfers Overall transfer level: Modified independent Equipment used: Straight cane                Ambulation/Gait Ambulation/Gait assistance: Modified independent (Device/Increase time) Ambulation Distance (Feet): 350 Feet Assistive device: Straight cane Gait Pattern/deviations: Step-through pattern;Decreased stride length;Trunk flexed Gait velocity: 20'/21sec=.952 Gait velocity interpretation: <1.8 ft/sec, indicative of risk for recurrent falls General Gait Details: pt with cane in left hand and able to walk slowly but safely    Stairs            Wheelchair Mobility    Modified Rankin (Stroke Patients Only)       Balance Overall balance assessment: Needs assistance   Sitting balance-Leahy Scale: Normal       Standing balance-Leahy Scale: Good                              Cognition Arousal/Alertness: Awake/alert Behavior During  Therapy: WFL for tasks assessed/performed Overall Cognitive Status: Within Functional Limits for tasks assessed                                        Exercises General Exercises - Lower Extremity Long Arc Quad: AROM;Both;Seated;10 reps Hip Flexion/Marching: AROM;Both;Seated;10 reps    General Comments        Pertinent Vitals/Pain Pain Assessment: No/denies pain Faces Pain Scale: Hurts little more Pain Location: ribs Pain Descriptors / Indicators: Sore Pain Intervention(s): Limited activity within patient's tolerance    Home Living                      Prior Function            PT Goals (current goals can now be found in the care plan section) Acute Rehab PT Goals Patient Stated Goal: to gohome Time For Goal Achievement: 01/23/17 Potential to Achieve Goals: Good Progress towards PT goals: Goals met and updated - see care plan    Frequency           PT Plan Current plan remains appropriate    Co-evaluation              AM-PAC PT "6 Clicks" Daily Activity  Outcome Measure  Difficulty turning over in bed (including adjusting bedclothes, sheets and blankets)?: None Difficulty moving from lying on back to sitting on the side of the bed? : None Difficulty sitting down on and standing up from a chair with arms (e.g., wheelchair, bedside commode, etc,.)?: None Help needed moving to and from a bed to chair (including a wheelchair)?: None Help needed walking in hospital room?: A Little Help needed climbing 3-5 steps with a railing? : A Little 6 Click Score: 22    End of Session Equipment Utilized During Treatment: Gait belt;Oxygen Activity Tolerance: Patient tolerated treatment well Patient left: in bed;with call bell/phone within reach;Other (comment) (in bed for transport)   PT Visit Diagnosis: Repeated falls (R29.6);Muscle weakness (generalized) (M62.81)     Time: 7471-8550 PT Time Calculation (min) (ACUTE ONLY): 23  min  Charges:  $Gait Training: 8-22 mins $Therapeutic Exercise: 8-22 mins                    G Codes:       Elwyn Reach, PT [phone]    [place] 01/09/2017, 11:57 AM

## 2017-01-09 NOTE — Progress Notes (Signed)
  Echocardiogram 2D Echocardiogram has been performed.  Cristian Collins 01/09/2017, 10:32 AM

## 2017-01-09 NOTE — Progress Notes (Signed)
PROGRESS NOTE    Cristian Collins  VPX:[id number] DOB: 16-Jan-1930 DOA: 01/03/2017 PCP: Glendale Chard, MD   Chief Complaint  Patient presents with  . Shortness of Breath    Brief Narrative:  HPI On 01/03/2017 by Ms. [name], NP Cristian Collins is a 83 y.o. male with medical history significant for CAD status post stents, COPD on oxygen at home, CHF presents to the emergency Department chief complaint worsening shortness of breath and chest pain. Initial evaluation reveals increased work of breathing at baseline but patient complains of chest pain. Being admitted for chest pain rule out  Information is obtained from the patient. He reports being seen by pulmonology yesterday for his one-month follow-up. Note indicates he has been doing well and having no episodes of worsening shortness of breath. He states that this morning he awakened with wheezing and increased work of breathing. He uses nebulizers and got some relief. He denies headache dizziness syncope or near-syncope. He denies chest pain palpitations lower extremity edema orthopnea. He denies abdominal pain nausea vomiting dysuria hematuria frequency or urgency. He was ambulated in the hall and developed left anterior chest pain. He states the pain was intermittent felt sharp radiated to his right side.  Interim history Patient seen by pulmonary and given small dose of Lasix. Found to have an elevated BNP of 308. Have ordered echocardiogram. Patient's breathing did improve with Lasix.  Assessment & Plan   COPD exacerbation with chronic use of oxygen/chronic respiratory failure with hypoxia and hypercapnia -Patient uses 3 L of home oxygen -Pulmonology consulted and appreciated -Patient does not feel his breathing is at baseline, however mildly improved from admission -Patient was given 1 dose of Lasix, breathing improved mildly -Continue doxycycline, flonase, mucinex -Will need to be transitioned back to Spiriva, albuterol,  Symbicort when ready for discharge and follow-up with Dr. Melvyn Novas.  Possible heart failure -As above, BNP elevated at 308. -Patient's breathing did improve with dose of Lasix -Suspect possible heart failure from pulmonary disease -Monitor daily weights, intake/output -Continue Demadex and spironolactone -Echocardiogram pending  Elevated DDimer -obtained a few days ago, 1.17 -Lower extremity doppler obtained, negative for DVT -Discussed with pulmonology, Dr. Lamonte Sakai.  Patient unlikely to be a candidate for anticoagulation give his multiple coomorbidities and deconditioning. Suspect secondary to known chronic respiratory failure.   Chest pain -Resolved, troponins unremarkable  Essential hypertension -Continue amlodipine, metoprolol, Imdur  Coronary artery disease -Continue metoprolol, aspirin, statin  Hypokalemia -Resolved with replacement  Chronic anemia -Baseline hemoglobin 8 and 9, currently 8 -continue to monitor CBC  Severe Protein calorie malnutrition -Continue nutritional supplements  Deconditioning -PT consulted and recommended home health  Goals of care -Supposedly patient was seen by Hospice at home in the past.  -Attempted to discuss code status, remains full code at this time.  DVT Prophylaxis  Lovenox  Code Status: Full  Family Communication: None at bedside  Disposition Plan: Admitted. Discharge to home when breathing has improved.  Consultants Pulmonology  Procedures  None  Antibiotics   Anti-infectives    Start     Dose/Rate Route Frequency Ordered Stop   01/03/17 1800  doxycycline (VIBRA-TABS) tablet 100 mg     100 mg Oral Every 12 hours 01/03/17 1730        Subjective:   Cristian Collins seen and examined today.  Continues to feel short of breath. Denies cough. Denies chest pain, abdominal pain, N/V/D/C.   Objective:   Vitals:   01/08/17 2142 01/09/17 0509 01/09/17 1154 01/09/17  1357  BP:  (!) 115/54  (!) 120/48  Pulse:  67 70 62    Resp:  19  18  Temp:  98.7 F (37.1 C)  97.7 F (36.5 C)  TempSrc:  Oral  Oral  SpO2: 98% 100% 95% 100%  Weight:  49.6 kg (109 lb 5.6 oz)    Height:        Intake/Output Summary (Last 24 hours) at 01/09/17 1425 Last data filed at 01/09/17 0936  Gross per 24 hour  Intake              240 ml  Output             1400 ml  Net            -1160 ml   Filed Weights   01/03/17 2100 01/08/17 0200 01/09/17 0509  Weight: 46.4 kg (102 lb 4.7 oz) 45.4 kg (100 lb) 49.6 kg (109 lb 5.6 oz)   Exam  General: Well developed, cachetic, thin, no apparent distress  HEENT: NCAT, mucous membranes moist.   Cardiovascular: S1 S2 auscultated, RRR, no murmurs appreciated  Respiratory: Diminished breath sounds (able to speak incomplete sentences without pausing)  Abdomen: Soft, nontender, nondistended, + bowel sounds  Extremities: warm dry without cyanosis clubbing or edema  Neuro: AAOx3, nofocal  Psych: Appropriate mood and affect  Data Reviewed: I have personally reviewed following labs and imaging studies  CBC:  Recent Labs Lab 01/03/17 1045 01/04/17 0220  WBC 3.5* 4.7  NEUTROABS 2.2  --   HGB 8.4* 8.0*  HCT 26.0* 24.9*  MCV 87.8 86.8  PLT 187 409   Basic Metabolic Panel:  Recent Labs Lab 01/03/17 1045 01/03/17 1557 01/04/17 0220 01/07/17 1306 01/08/17 0251 01/09/17 0733  NA 140  --  137 132* 136 134*  K 3.0*  --  4.5 3.8 3.8 3.9  CL 104  --  104 94* 96* 94*  CO2 29  --  30 28 34* 34*  GLUCOSE 98  --  162* 287* 105* 97  BUN 10  --  15 31* 29* 20  CREATININE 0.92  --  1.09 1.23 1.15 1.18  CALCIUM 8.7*  --  8.7* 8.8* 8.5* 8.4*  MG  --  2.2  --   --   --   --   PHOS  --  2.0*  --   --   --   --    GFR: Estimated Creatinine Clearance: 32.7 mL/min (by C-G formula based on SCr of 1.18 mg/dL). Liver Function Tests: No results for input(s): AST, ALT, ALKPHOS, BILITOT, PROT, ALBUMIN in the last 168 hours. No results for input(s): LIPASE, AMYLASE in the last 168 hours. No  results for input(s): AMMONIA in the last 168 hours. Coagulation Profile: No results for input(s): INR, PROTIME in the last 168 hours. Cardiac Enzymes:  Recent Labs Lab 01/03/17 1557 01/03/17 1817 01/03/17 2145  TROPONINI 0.04* <0.03 <0.03   BNP (last 3 results) No results for input(s): PROBNP in the last 8760 hours. HbA1C: No results for input(s): HGBA1C in the last 72 hours. CBG: No results for input(s): GLUCAP in the last 168 hours. Lipid Profile: No results for input(s): CHOL, HDL, LDLCALC, TRIG, CHOLHDL, LDLDIRECT in the last 72 hours. Thyroid Function Tests: No results for input(s): TSH, T4TOTAL, FREET4, T3FREE, THYROIDAB in the last 72 hours. Anemia Panel: No results for input(s): VITAMINB12, FOLATE, FERRITIN, TIBC, IRON, RETICCTPCT in the last 72 hours. Urine analysis:    Component Value Date/Time  COLORURINE YELLOW 02/11/2016 0029   APPEARANCEUR CLEAR 02/11/2016 0029   LABSPEC 1.015 02/11/2016 0029   PHURINE 5.0 02/11/2016 0029   GLUCOSEU NEGATIVE 02/11/2016 0029   HGBUR NEGATIVE 02/11/2016 0029   BILIRUBINUR NEGATIVE 02/11/2016 0029   KETONESUR NEGATIVE 02/11/2016 0029   PROTEINUR 30 (A) 02/11/2016 0029   UROBILINOGEN 0.2 10/08/2014 0838   NITRITE NEGATIVE 02/11/2016 0029   LEUKOCYTESUR NEGATIVE 02/11/2016 0029   Sepsis Labs: @LABRCNTIP (procalcitonin:4,lacticidven:4)  )No results found for this or any previous visit (from the past 240 hour(s)).    Radiology Studies: No results found.   Scheduled Meds: . amLODipine  10 mg Oral Daily  . aspirin EC  325 mg Oral Daily  . doxycycline  100 mg Oral Q12H  . enoxaparin (LOVENOX) injection  30 mg Subcutaneous Q24H  . feeding supplement (ENSURE ENLIVE)  237 mL Oral TID WC  . fluticasone  1 spray Each Nare Daily  . guaiFENesin  1,200 mg Oral BID  . ipratropium-albuterol  3 mL Nebulization QID  . isosorbide mononitrate  60 mg Oral Daily  . metoprolol succinate  12.5 mg Oral BID  . mometasone-formoterol  2  puff Inhalation BID  . multivitamin with minerals  1 tablet Oral Daily  . pantoprazole  40 mg Oral BID AC  . simvastatin  20 mg Oral q1800  . spironolactone  25 mg Oral Daily  . torsemide  20 mg Oral QODAY   Continuous Infusions:   LOS: 5 days   Time Spent in minutes   45 minutes  Skyelar Halliday D.O. on 01/09/2017 at 2:25 PM  Between 7am to 7pm - Pager - [phone]  After 7pm go to [email or web address] - password [email or web address]  And look for the night coverage person covering for me after hours  [facility] Office  [phone]

## 2017-01-09 NOTE — Progress Notes (Signed)
PT Cancellation Note  Patient Details Name: Cristian Collins MRN: [id number] DOB: 03-04-1930   Cancelled Treatment:    Reason Eval/Treat Not Completed: Patient at procedure or test/unavailable   [name] B [name] 01/09/2017, 10:03 AM  Elwyn Reach, [place]

## 2017-01-09 NOTE — Progress Notes (Signed)
VASCULAR LAB PRELIMINARY  PRELIMINARY  PRELIMINARY  PRELIMINARY  Bilateral lower extremity venous duplex completed.    Preliminary report:  Bilateral:  No evidence of DVT, superficial thrombosis, or Baker's Cyst.   Jaycee Pelzer, RVS 01/09/2017, 12:31 PM

## 2017-01-09 NOTE — Progress Notes (Signed)
Occupational Therapy Treatment and Discharge Patient Details Name: Cristian Collins MRN: [id number] DOB: [birth year] Today's Date: 01/09/2017    History of present illness Cristian Collins is a 83 y.o. male with a Past Medical History significant for COPD, CHF, coronary artery disease who presents with sob and CP.     OT comments  Pt is functioning at a modified independent level in ADL,. Demonstrated good safety awareness and endurance. No further OT needs.  Follow Up Recommendations  No OT follow up;Supervision - Intermittent    Equipment Recommendations  None recommended by OT    Recommendations for Other Services      Precautions / Restrictions Precautions Precautions: None Restrictions Weight Bearing Restrictions: No       Mobility Bed Mobility Overal bed mobility: Independent                Transfers Overall transfer level: Modified independent Equipment used: Straight cane                  Balance     Sitting balance-Leahy Scale: Normal       Standing balance-Leahy Scale: Good                             ADL either performed or assessed with clinical judgement   ADL Overall ADL's : Modified independent                                     Functional mobility during ADLs: Supervision/safety;Cane (for 02 tubing) General ADL Comments: Pt performed self care modified independently standing at sink. He gathered necessary ADL items with supervision for 02 tubing.     Vision       Perception     Praxis      Cognition Arousal/Alertness: Awake/alert Behavior During Therapy: WFL for tasks assessed/performed Overall Cognitive Status: Within Functional Limits for tasks assessed                                          Exercises     Shoulder Instructions       General Comments      Pertinent Vitals/ Pain       Pain Assessment: No/denies pain  Home Living                                           Prior Functioning/Environment              Frequency           Progress Toward Goals  OT Goals(current goals can now be found in the care plan section)  Progress towards OT goals: Goals met/education completed, patient discharged from OT  Acute Rehab OT Goals Patient Stated Goal: to gohome  Plan All goals met and education completed, patient discharged from OT services;Discharge plan remains appropriate    Co-evaluation                 AM-PAC PT "6 Clicks" Daily Activity     Outcome Measure   Help from another person eating meals?: None Help from another person taking care of personal grooming?: None Help from another  person toileting, which includes using toliet, bedpan, or urinal?: None Help from another person bathing (including washing, rinsing, drying)?: None Help from another person to put on and taking off regular upper body clothing?: None Help from another person to put on and taking off regular lower body clothing?: None 6 Click Score: 24    End of Session Equipment Utilized During Treatment: Oxygen  OT Visit Diagnosis: Unsteadiness on feet (R26.81)   Activity Tolerance Patient tolerated treatment well   Patient Left in bed;with call bell/phone within reach;with family/visitor present   Nurse Communication          Time: 5072-2575 OT Time Calculation (min): 40 min  Charges: OT General Charges $OT Visit: 1 Procedure OT Treatments $Self Care/Home Management : 38-52 mins     Malka So 01/09/2017, 11:24 AM  [phone]

## 2017-01-09 NOTE — Progress Notes (Signed)
PULMONARY / CRITICAL CARE MEDICINE   Name: Cristian Collins MRN: [id number] DOB: May 07, 1930    ADMISSION DATE:  01/03/2017 CONSULTATION DATE:  01/06/17  REFERRING MD:  [facility]  CHIEF COMPLAINT:  Sob/cough   HISTORY OF PRESENT ILLNESS:   83 yobm quit smoking 1970's due to cough/ sob got some better then started getting worse again around 2009 referred by [name] 04/07/2012 to pulmonary clinic for sob x 81 months with GOLD III COPD by pfts 35/4656 complicated by chronic hypoxemia/hypercarbic resp failure / cor pulmonale  presented to the emergency Department 5/25  chief complaint worsening shortness of breath and chest pain. Initial evaluation revealed increased work of breathing at baseline but patient complains of chest pain. Being admitted for chest pain rule out  Information is obtained from the patient. He reports being seen by pulmonology NP on day PTA for his one-month follow-up. Note indicates he has been doing well and having no episodes of worsening shortness of breath. He states am of admit  he awakened with wheezing and increased work of breathing. He used nebulizers and got some relief .  CP resolved but not improving in terms of cough and sob so pccm consulted pm 5/28   No obvious patterns  day to day or daytime variability or assoc   mucus plugs or hemoptysis or   chest tightness, subjective wheeze or overt sinus or hb symptoms. No unusual exp hx or h/o childhood pna/ asthma or knowledge of premature birth.  Sleeping ok without nocturnal  or early am exacerbation  of respiratory  c/o's or need for noct saba. Also denies any obvious fluctuation of symptoms with weather or environmental changes or other aggravating or alleviating factors except as outlined above       SUBJECTIVE:  Less distressed, easily confused. Little insight into code status.  VITAL SIGNS: BP (!) 115/54 (BP Location: Left Arm)   Pulse 67   Temp 98.7 F (37.1 C) (Oral)   Resp 19   Ht 5\' 6"  (1.676 m)    Wt 109 lb 5.6 oz (49.6 kg)   SpO2 100%   BMI 17.65 kg/m   HEMODYNAMICS:    VENTILATOR SETTINGS: FiO2 (%):  [32 %] 32 %   Intake/Output Summary (Last 24 hours) at 01/09/17 0801 Last data filed at 01/09/17 0014  Gross per 24 hour  Intake              600 ml  Output             1500 ml  Net             -900 ml   Filed Weights   01/03/17 2100 01/08/17 0200 01/09/17 0509  Weight: 102 lb 4.7 oz (46.4 kg) 100 lb (45.4 kg) 109 lb 5.6 oz (49.6 kg)   PHYSICAL EXAMINATION: General:  Frail, thin,aam in no acute distress HEENT: MM pink/moist CLE:XNTZ anxious today. Becomes confused with questions Neuro: MAE x4, follows commands, poor insight  CV: HSR RRR PULM: Decreased air movement, less distressed today GY:FVCB, non-tender, bsx4 active  Extremities: warm/dry, - edema  Skin: no rashes or lesions    LABS:  BMET  Recent Labs Lab 01/04/17 0220 01/07/17 1306 01/08/17 0251  NA 137 132* 136  K 4.5 3.8 3.8  CL 104 94* 96*  CO2 30 28 34*  BUN 15 31* 29*  CREATININE 1.09 1.23 1.15  GLUCOSE 162* 287* 105*    Electrolytes  Recent Labs Lab 01/03/17 1557 01/04/17 0220  01/07/17 1306 01/08/17 0251  CALCIUM  --  8.7* 8.8* 8.5*  MG 2.2  --   --   --   PHOS 2.0*  --   --   --     CBC  Recent Labs Lab 01/03/17 1045 01/04/17 0220  WBC 3.5* 4.7  HGB 8.4* 8.0*  HCT 26.0* 24.9*  PLT 187 196    Coag's No results for input(s): APTT, INR in the last 168 hours.  Sepsis Markers No results for input(s): LATICACIDVEN, PROCALCITON, O2SATVEN in the last 168 hours.  ABG No results for input(s): PHART, PCO2ART, PO2ART in the last 168 hours.  Liver Enzymes No results for input(s): AST, ALT, ALKPHOS, BILITOT, ALBUMIN in the last 168 hours.  Cardiac Enzymes  Recent Labs Lab 01/03/17 1557 01/03/17 1817 01/03/17 2145  TROPONINI 0.04* <0.03 <0.03    Glucose No results for input(s): GLUCAP in the last 168 hours.  Imaging No results found.     LEDS>>>  Intake/Output Summary (Last 24 hours) at 01/09/17 0801 Last data filed at 01/09/17 0014  Gross per 24 hour  Intake              600 ml  Output             1500 ml  Net             -900 ml   D dimer 1.17 5/28  ASSESSMENT / PLAN:    1)  AECOPD in pt with very severe dz approaching endstage dz complicated by chronic hypoxemic resp failure and cor pulmonale. Gold 3 COPD -better with diuresis -He is followed by home hospice by record but is listed as a full code.  He has very little insight into code status. His family appears to be decision makers. - bid PPI for ? Reflux -FU Dr. Melvyn Novas on DC   ? CHF > has h/o cor pulmonale, G2DD bnp 308>   Lasix 5/31 per IM with negative .9 litres. Negative 3.4 litres over 48 hours.  + d dimer 5/28 Plan: LEDS ordered ? CT scan to ro PE, will defer to MD. Doubt at 83 yo he is an anticoagulation candidate.   Richardson Landry Minor ACNP Maryanna Shape PCCM Pager [phone] till 3 pm If no answer page [phone] 01/09/2017, 8:01 AM   Attending Note:  I have examined patient, reviewed labs, studies and notes. I have discussed the case with S Minor, and I agree with the data and plans as amended above. 83 yo man w severe COPD and associated chronic resp failure, secondary PAH, admitted with AE-COPD and acute on chronic resp failure. He has improved some but not at prior baseline. Agree with current BD's. Agree also with diuresis as he can tolerate. Low likelihood PE in this setting - would defer CT-PA at this time. Again, I believe that he would benefit from Palliative Care input. He has apparently had Hospice services in the past per notes. Certainly he would qualify.     Baltazar Apo, MD, PhD 01/09/2017, 4:46 PM [facility] [phone] or if no answer [phone]

## 2017-01-10 ENCOUNTER — Inpatient Hospital Stay: Admission: RE | Admit: 2017-01-10 | Payer: Medicare Other | Source: Ambulatory Visit

## 2017-01-10 LAB — ECHOCARDIOGRAM COMPLETE
AVLVOTPG: 6 mmHg
CHL CUP DOP CALC LVOT VTI: 26.4 cm
E decel time: 342 msec
E/e' ratio: 6.36
FS: 33 % (ref 28–44)
Height: 66 in
IV/PV OW: 0.78
LA vol A4C: 25.3 ml
LA vol index: 22.3 mL/m2
LA vol: 34.6 mL
LADIAMINDEX: 1.68 cm/m2
LASIZE: 26 mm
LEFT ATRIUM END SYS DIAM: 26 mm
LV E/e' medial: 6.36
LV PW d: 9 mm — AB (ref 0.6–1.1)
LV TDI E'MEDIAL: 6.85
LV e' LATERAL: 10.3 cm/s
LVEEAVG: 6.36
LVOT SV: 75 mL
LVOT area: 2.84 cm2
LVOT peak vel: 122 cm/s
LVOTD: 19 mm
Lateral S' vel: 7.29 cm/s
MV Dec: 342
MVPKAVEL: 88.1 m/s
MVPKEVEL: 65.5 m/s
TAPSE: 23.4 mm
TDI e' lateral: 10.3
Weight: 1749.57 oz

## 2017-01-10 LAB — CBC
HCT: 28.2 % — ABNORMAL LOW (ref 39.0–52.0)
Hemoglobin: 9 g/dL — ABNORMAL LOW (ref 13.0–17.0)
MCH: 27.6 pg (ref 26.0–34.0)
MCHC: 31.9 g/dL (ref 30.0–36.0)
MCV: 86.5 fL (ref 78.0–100.0)
PLATELETS: 256 10*3/uL (ref 150–400)
RBC: 3.26 MIL/uL — AB (ref 4.22–5.81)
RDW: 14.2 % (ref 11.5–15.5)
WBC: 6 10*3/uL (ref 4.0–10.5)

## 2017-01-10 LAB — BASIC METABOLIC PANEL
ANION GAP: 7 (ref 5–15)
BUN: 17 mg/dL (ref 6–20)
CO2: 33 mmol/L — ABNORMAL HIGH (ref 22–32)
Calcium: 8.7 mg/dL — ABNORMAL LOW (ref 8.9–10.3)
Chloride: 97 mmol/L — ABNORMAL LOW (ref 101–111)
Creatinine, Ser: 0.95 mg/dL (ref 0.61–1.24)
GFR calc Af Amer: >60 mL/min (ref >60)
Glucose, Bld: 87 mg/dL (ref 65–99)
POTASSIUM: 4 mmol/L (ref 3.5–5.1)
Sodium: 137 mmol/L (ref 135–145)

## 2017-01-10 MED ORDER — METOPROLOL SUCCINATE ER 25 MG PO TB24: 12.5000 mg | ORAL_TABLET | Freq: Two times a day (BID) | ORAL | 0 refills | Status: AC

## 2017-01-10 MED ORDER — FLUTICASONE PROPIONATE 50 MCG/ACT NA SUSP: 1.0000 | Freq: Every day | NASAL | 0 refills | Status: AC

## 2017-01-10 NOTE — Progress Notes (Signed)
Pt given all discharged instructions and verbalized understanding.  Hard prescription given to pt. Pt discharged home with brother via wc and 3l [place] to hook up to his own personal tank once in car.  Pt without any complaints at d/c time.

## 2017-01-10 NOTE — Discharge Summary (Signed)
Physician Discharge Summary  Cristian Collins QQV:[id number] DOB: [birth year] DOA: 01/03/2017  PCP: Cristian Chard, MD  Admit date: 01/03/2017 Discharge date: 01/10/2017  Time spent: 45 minutes  Recommendations for Outpatient Follow-up:  Patient will be discharged to home with home health physical therapy, nursing, social work, respiratory care.  Patient will need to follow up with primary care provider within one week of discharge.  Follow up with pulmonology, Dr. Melvyn Collins, in one week. Patient should continue medications as prescribed.  Patient should follow a heart healthy diet.   Discharge Diagnoses:  COPD exacerbation with chronic use of oxygen/chronic respiratory failure with hypoxia and hypercapnia Acute diastolic heart failure Elevated DDimer Chest pain Essential hypertension Coronary artery disease Hypokalemia Chronic anemia Severe Protein calorie malnutrition Deconditioning Goals of care  Discharge Condition: Stable  Diet recommendation: heart healthy  Filed Weights   01/08/17 0200 01/09/17 0509 01/10/17 0520  Weight: 45.4 kg (100 lb) 49.6 kg (109 lb 5.6 oz) 43.3 kg (95 lb 8 oz)    History of present illness:  On 01/03/2017 by Ms. Cristian Carrel, NP Cristian Silence Sheltonis a 83 y.o.malewith medical history significant for CAD status post stents, COPD on oxygen at home, CHF presents to the emergency Department chief complaint worsening shortness of breath and chest pain. Initial evaluation reveals increased work of breathing at baseline but patient complains of chest pain. Being admitted for chest pain rule out  Information is obtained from the patient. He reports being seen by pulmonology yesterday for his one-month follow-up. Note indicates he has been doing well and having no episodes of worsening shortness of breath. He states that this morning he awakened with wheezing and increased work of breathing. He uses nebulizers and got some relief. He denies headache dizziness syncope  or near-syncope. He denies chest pain palpitations lower extremity edema orthopnea. He denies abdominal pain nausea vomiting dysuria hematuria frequency or urgency. He was ambulated in the hall and developed left anterior chest pain. He states the pain was intermittent felt sharp radiated to his right side.  Hospital Course:  COPD exacerbation with chronic use of oxygen/chronic respiratory failure with hypoxia and hypercapnia -Patient uses 3 L of home oxygen, currently on 3L  -Pulmonology consulted and appreciated -Patient does not feel his breathing is at baseline, however mildly improved from admission -Patient was given 1 dose of Lasix, breathing improved mildly -Continue  flonase, mucinex -Completed course of doxycycline during hospitalization -Will need to be transitioned back to Spiriva, albuterol, Symbicort when ready for discharge and follow-up with Dr. Melvyn Collins. -Feel that part of patient's respiratory failure is due to anxiety. Discussed this with Dr. Lamonte Collins. Patient actually appears better than he feels. He is able to tolerate a 10+ minute conversation without hesitation or having to stop due to SOB.  Dr. Lamonte Collins recommended continuing demadex and spironolactone. No lasix for now.  -Will discharge patient with home health services, respiratory care.  Acute diastolic heart failure -As above, BNP elevated at 308. -Patient's breathing did improve with dose of Lasix -Suspect possible heart failure from pulmonary disease -Monitor daily weights, intake/output -Continue Demadex and spironolactone -Echocardiogram showed grade 1 diastolic dysfunction, systolic function normal, no RWMA  Elevated DDimer -obtained a few days ago, 1.17 -Lower extremity doppler obtained, negative for DVT -Discussed with pulmonology, Dr. Lamonte Collins.  Patient unlikely to be a candidate for anticoagulation give his multiple coomorbidities and deconditioning. Suspect secondary to known chronic respiratory failure.    Chest pain -Resolved, troponins unremarkable  Essential hypertension -Continue amlodipine, metoprolol,  Imdur  Coronary artery disease -Continue metoprolol, aspirin, statin  Hypokalemia -Resolved with replacement  Chronic anemia -Baseline hemoglobin 8 and 9, currently 9  Severe Protein calorie malnutrition -Continue nutritional supplements  Deconditioning -PT consulted and recommended home health -OT consulted, no further needs  Goals of care -Supposedly patient was seen by Hospice at home in the past.  -Attempted to discuss code status, remains full code at this time. -Discussed with case management. Patient has been seen by [facility]/palliative care at home. This should continue -Will discharge patient with [facility] RN and SW. -patient wants to discuss Code status and hospice with his pulmonologist, Dr. Melvyn Collins.  Consultants Pulmonology  Procedures  Echocardiogram  Discharge Exam: Vitals:   01/09/17 2021 01/10/17 0520  BP: (!) 120/58 (!) 119/53  Pulse: 61 60  Resp: 18 19  Temp: 97.8 F (36.6 C) 98.3 F (36.8 C)   Patient states he is feeling better. He states his breathing is better.  Denies chest pain, abdominal pain, nausea/vomiting, diarrhea, constipation.   General: Well developed,elderly, chronically ill appearing, NAD  HEENT: NCAT, mucous membranes moist.  Cardiovascular: S1 S2 auscultated, RRR, no murmurs appreciated  Respiratory: Diminished breath sounds, no wheezing. Able to speak in complete sentences  Abdomen: Soft, nontender, nondistended, + bowel sounds  Extremities: warm dry without cyanosis clubbing or edema  Neuro: AAOx3, nonfocal  Psych: Anxious but appropriate  Discharge Instructions Discharge Instructions    Discharge instructions    Complete by:  As directed    Patient will be discharged to home with home health physical therapy, nursing, social work.  Patient will need to follow up with primary care provider within one week of  discharge.  Follow up with pulmonology, Dr. Melvyn Collins, in one week. Patient should continue medications as prescribed.  Patient should follow a heart healthy diet.     Current Discharge Medication List    START taking these medications   Details  fluticasone (FLONASE) 50 MCG/ACT nasal spray Place 1 spray into both nostrils daily. Qty: 16 g, Refills: 0      CONTINUE these medications which have CHANGED   Details  metoprolol succinate (TOPROL-XL) 25 MG 24 hr tablet Take 0.5 tablets (12.5 mg total) by mouth 2 (two) times daily. Qty: 30 tablet, Refills: 0      CONTINUE these medications which have NOT CHANGED   Details  acetaminophen (TYLENOL) 325 MG tablet Take 325 mg by mouth every 6 (six) hours as needed for mild pain. Per bottle as needed for pain    albuterol (PROAIR HFA) 108 (90 Base) MCG/ACT inhaler 2 puffs every 4 hours as needed only  if your can't catch your breath Qty: 1 Inhaler, Refills: 11   Associated Diagnoses: COPD mixed type (HCC)    albuterol (PROVENTIL) (2.5 MG/3ML) 0.083% nebulizer solution Take 3 mLs (2.5 mg total) by nebulization every 4 (four) hours as needed for shortness of breath. For shortness of breath Qty: 75 mL, Refills: 12    amLODipine (NORVASC) 10 MG tablet Take 10 mg by mouth daily.     aspirin EC 81 MG tablet Take 81 mg by mouth daily.    budesonide-formoterol (SYMBICORT) 160-4.5 MCG/ACT inhaler Inhale 2 puffs into the lungs 2 (two) times daily. Qty: 2 Inhaler, Refills: 0    !! feeding supplement, ENSURE COMPLETE, (ENSURE COMPLETE) LIQD Take 237 mLs by mouth 3 (three) times daily between meals. Qty: 30 Bottle, Refills: 2    !! feeding supplement, ENSURE COMPLETE, (ENSURE COMPLETE) LIQD Take 237 mLs by  mouth 3 (three) times daily with meals.     guaiFENesin (MUCINEX) 600 MG 12 hr tablet Take 2 tablets (1,200 mg total) by mouth 2 (two) times daily. Qty: 30 tablet, Refills: 0    guaiFENesin-codeine 100-10 MG/5ML syrup Take 5 mLs by mouth every 6 (six)  hours as needed for cough. Qty: 120 mL, Refills: 0    isosorbide mononitrate (IMDUR) 60 MG 24 hr tablet Take 60 mg by mouth daily.    pantoprazole (PROTONIX) 40 MG tablet Take 1 tablet (40 mg total) by mouth daily. Qty: 30 tablet, Refills: 0    simvastatin (ZOCOR) 20 MG tablet Take 20 mg by mouth daily.    spironolactone (ALDACTONE) 25 MG tablet Take 25 mg by mouth daily. Reported on 01/16/2016 Refills: 3    Tiotropium Bromide Monohydrate (SPIRIVA RESPIMAT) 2.5 MCG/ACT AERS Inhale 2 puffs into the lungs daily. Qty: 1 Inhaler, Refills: 11    torsemide (DEMADEX) 20 MG tablet Take 20 mg by mouth every other day.  Refills: 2    traMADol (ULTRAM) 50 MG tablet Take 1 tablet (50 mg total) by mouth every 6 (six) hours as needed for moderate pain. Qty: 30 tablet, Refills: 0    Vitamin D, Ergocalciferol, (DRISDOL) 50000 units CAPS capsule Take 50,000 Units by mouth 2 (two) times a week. Monday and Thursday    nitroGLYCERIN (NITROSTAT) 0.4 MG SL tablet Place 0.4 mg under the tongue every 5 (five) minutes as needed for chest pain. For chest pain     !! - Potential duplicate medications found. Please discuss with provider.     No Known Allergies Follow-up Information    Cristian Chard, MD. Schedule an appointment as soon as possible for a visit in 1 week(s).   Specialty:  Internal Medicine Why:  Hospital follow up Contact information: [address] [place] [address] [phone]        Tanda Rockers, MD. Schedule an appointment as soon as possible for a visit in 1 week(s).   Specialty:  Pulmonary Disease Why:  Hospital follow up Contact information: [address] [address] [phone]        [facility] Follow up.   Why:  For PT SW. They will contact you in 1-2 days to set up your home health visit. Contact information: [address] [place] [address] [phone]            The results of significant  diagnostics from this hospitalization (including imaging, microbiology, ancillary and laboratory) are listed below for reference.    Significant Diagnostic Studies: Dg Chest 2 View  Result Date: 01/03/2017 CLINICAL DATA:  Shortness of breath EXAM: CHEST  2 VIEW COMPARISON:  07/18/2016 FINDINGS: Normal heart size and stable mediastinal contours. Coronary stent noted. COPD with hyperinflation. Calcified pleural plaque along the right diaphragm. There is no edema, consolidation, effusion, or pneumothorax. Nodular density at the left base is attributed to nipple shadow. No acute osseous finding. IMPRESSION: COPD without acute superimposed finding. Electronically Signed   By: Monte Fantasia M.D.   On: 01/03/2017 11:22    Microbiology: No results found for this or any previous visit (from the past 240 hour(s)).   Labs: Basic Metabolic Panel:  Recent Labs Lab 01/03/17 1557 01/04/17 0220 01/07/17 1306 01/08/17 0251 01/09/17 0733 01/10/17 0456  NA  --  137 132* 136 134* 137  K  --  4.5 3.8 3.8 3.9 4.0  CL  --  104 94* 96*  94* 97*  CO2  --  30 28 34* 34* 33*  GLUCOSE  --  162* 287* 105* 97 87  BUN  --  15 31* 29* 20 17  CREATININE  --  1.09 1.23 1.15 1.18 0.95  CALCIUM  --  8.7* 8.8* 8.5* 8.4* 8.7*  MG 2.2  --   --   --   --   --   PHOS 2.0*  --   --   --   --   --    Liver Function Tests: No results for input(s): AST, ALT, ALKPHOS, BILITOT, PROT, ALBUMIN in the last 168 hours. No results for input(s): LIPASE, AMYLASE in the last 168 hours. No results for input(s): AMMONIA in the last 168 hours. CBC:  Recent Labs Lab 01/04/17 0220 01/10/17 0456  WBC 4.7 6.0  HGB 8.0* 9.0*  HCT 24.9* 28.2*  MCV 86.8 86.5  PLT 196 256   Cardiac Enzymes:  Recent Labs Lab 01/03/17 1557 01/03/17 1817 01/03/17 2145  TROPONINI 0.04* <0.03 <0.03   BNP: BNP (last 3 results)  Recent Labs  02/10/16 2205 07/18/16 1046 01/06/17 1757  BNP 151.5* 75.2 308.2*    ProBNP (last 3 results) No  results for input(s): PROBNP in the last 8760 hours.  CBG: No results for input(s): GLUCAP in the last 168 hours.     SignedCristal Ford  [facility] 01/10/2017, 3:47 PM

## 2017-01-10 NOTE — Discharge Instructions (Signed)

## 2017-01-10 NOTE — Progress Notes (Signed)
PULMONARY / CRITICAL CARE MEDICINE   Name: COREYON NICOTRA MRN: [id number] DOB: 10-08-1929    ADMISSION DATE:  01/03/2017 CONSULTATION DATE:  01/06/17  REFERRING MD:  [facility]  CHIEF COMPLAINT:  Sob/cough   HISTORY OF PRESENT ILLNESS:   7 yobm quit smoking 1970's due to cough/ sob got some better then started getting worse again around 2009 referred by [name] 04/07/2012 to pulmonary clinic for sob x 6 months with GOLD III COPD by pfts 40/0867 complicated by chronic hypoxemia/hypercarbic resp failure / cor pulmonale  presented to the emergency Department 5/25  chief complaint worsening shortness of breath and chest pain. Initial evaluation revealed increased work of breathing at baseline but patient complains of chest pain. Being admitted for chest pain rule out  Information is obtained from the patient. He reports being seen by pulmonology NP on day PTA for his one-month follow-up. Note indicates he has been doing well and having no episodes of worsening shortness of breath. He states am of admit  he awakened with wheezing and increased work of breathing. He used nebulizers and got some relief .  CP resolved but not improving in terms of cough and sob so pccm consulted pm 5/28   No obvious patterns  day to day or daytime variability or assoc   mucus plugs or hemoptysis or   chest tightness, subjective wheeze or overt sinus or hb symptoms. No unusual exp hx or h/o childhood pna/ asthma or knowledge of premature birth.  Sleeping ok without nocturnal  or early am exacerbation  of respiratory  c/o's or need for noct saba. Also denies any obvious fluctuation of symptoms with weather or environmental changes or other aggravating or alleviating factors except as outlined above       SUBJECTIVE:  More alert and interactive with less distress.  VITAL SIGNS: BP (!) 119/53 (BP Location: Right Arm)   Pulse 60   Temp 98.3 F (36.8 C) (Oral)   Resp 19   Ht 5\' 6"  (1.676 m)   Wt 95 lb 8 oz  (43.3 kg)   SpO2 97%   BMI 15.41 kg/m   HEMODYNAMICS:    VENTILATOR SETTINGS:     Intake/Output Summary (Last 24 hours) at 01/10/17 0918 Last data filed at 01/10/17 0526  Gross per 24 hour  Intake              360 ml  Output             1525 ml  Net            -1165 ml   Filed Weights   01/08/17 0200 01/09/17 0509 01/10/17 0520  Weight: 100 lb (45.4 kg) 109 lb 5.6 oz (49.6 kg) 95 lb 8 oz (43.3 kg)   PHYSICAL EXAMINATION: General:  Frail elderly male who appears better HEENT: MM pink/moist, no jvd/lan PSY:Ni affect Neuro: intact with poor STM CV: HSD PULM:Shallow resp. Decreased air movement YP:PJKD, non-tender, bsx4 active  Extremities: warm/dry, - edema  Skin: no rashes or lesions    LABS:  BMET  Recent Labs Lab 01/08/17 0251 01/09/17 0733 01/10/17 0456  NA 136 134* 137  K 3.8 3.9 4.0  CL 96* 94* 97*  CO2 34* 34* 33*  BUN 29* 20 17  CREATININE 1.15 1.18 0.95  GLUCOSE 105* 97 87    Electrolytes  Recent Labs Lab 01/03/17 1557  01/08/17 0251 01/09/17 0733 01/10/17 0456  CALCIUM  --   < > 8.5* 8.4* 8.7*  MG 2.2  --   --   --   --   PHOS 2.0*  --   --   --   --   < > = values in this interval not displayed.  CBC  Recent Labs Lab 01/03/17 1045 01/04/17 0220 01/10/17 0456  WBC 3.5* 4.7 6.0  HGB 8.4* 8.0* 9.0*  HCT 26.0* 24.9* 28.2*  PLT 187 196 256    Coag's No results for input(s): APTT, INR in the last 168 hours.  Sepsis Markers No results for input(s): LATICACIDVEN, PROCALCITON, O2SATVEN in the last 168 hours.  ABG No results for input(s): PHART, PCO2ART, PO2ART in the last 168 hours.  Liver Enzymes No results for input(s): AST, ALT, ALKPHOS, BILITOT, ALBUMIN in the last 168 hours.  Cardiac Enzymes  Recent Labs Lab 01/03/17 1557 01/03/17 1817 01/03/17 2145  TROPONINI 0.04* <0.03 <0.03    Glucose No results for input(s): GLUCAP in the last 168 hours.  Imaging No results found.    LEDS>>>  Intake/Output Summary  (Last 24 hours) at 01/10/17 0918 Last data filed at 01/10/17 0526  Gross per 24 hour  Intake              360 ml  Output             1525 ml  Net            -1165 ml   D dimer 1.17 5/28  ASSESSMENT / PLAN:    1)  AECOPD in pt with very severe dz approaching endstage dz complicated by chronic hypoxemic resp failure and cor pulmonale. Gold 3 COPD -better with diuresis -He is followed by home hospice by record but is listed as a full code.  He has very little insight into code status. His family appears to be decision makers. - bid PPI for ? Reflux -FU Dr. Melvyn Novas on DC -ABx as noted   ? CHF > has h/o cor pulmonale, G2DD bnp 308>   Lasix 5/31 per IM with negative 1.1 litres. Negative 4.5 litres over 72 hours. I suspect diuresis is helping more than anything else.  + d dimer 5/28 Plan: LEDS neg No CT chest as he is not anticoagulation candidate PCCM will see again on Monday   [facility] Minor ACNP Maryanna Shape PCCM Pager [phone] till 3 pm If no answer page [phone] 01/10/2017, 9:18 AM   Attending Note:  I have examined patient, reviewed labs, studies and notes. I have discussed the case with S Minor, and I agree with the data and plans as amended above. Also discussed with Dr Ree Kida. 83 yo man with severe copd, admitted with acute on chronic resp failure due to an AE. I believe he is at or near his (new?) baseline. Nothing further therapeutically to add. I do agree with Dr Ree Kida that he would benefit from a formal hospice referral. He was willing to talk with me about this today. He has a good connection with Dr Melvyn Novas and wants to talk to him about Hospice and, just as importantly, code status. I believe he will be receptive based on my conversation with him today.    Baltazar Apo, MD, PhD 01/10/2017, 2:00 PM [facility] [phone] or if no answer [phone]

## 2017-01-14 DIAGNOSIS — Z87891 Personal history of nicotine dependence: Secondary | ICD-10-CM | POA: Diagnosis not present

## 2017-01-14 DIAGNOSIS — J441 Chronic obstructive pulmonary disease with (acute) exacerbation: Secondary | ICD-10-CM | POA: Diagnosis not present

## 2017-01-14 DIAGNOSIS — J9612 Chronic respiratory failure with hypercapnia: Secondary | ICD-10-CM | POA: Diagnosis not present

## 2017-01-14 DIAGNOSIS — Z9981 Dependence on supplemental oxygen: Secondary | ICD-10-CM | POA: Diagnosis not present

## 2017-01-14 DIAGNOSIS — I5031 Acute diastolic (congestive) heart failure: Secondary | ICD-10-CM | POA: Diagnosis not present

## 2017-01-14 DIAGNOSIS — J9611 Chronic respiratory failure with hypoxia: Secondary | ICD-10-CM | POA: Diagnosis not present

## 2017-01-14 DIAGNOSIS — Z7982 Long term (current) use of aspirin: Secondary | ICD-10-CM | POA: Diagnosis not present

## 2017-01-14 DIAGNOSIS — Z7951 Long term (current) use of inhaled steroids: Secondary | ICD-10-CM | POA: Diagnosis not present

## 2017-01-14 DIAGNOSIS — I110 Hypertensive heart disease with heart failure: Secondary | ICD-10-CM | POA: Diagnosis not present

## 2017-01-14 DIAGNOSIS — D539 Nutritional anemia, unspecified: Secondary | ICD-10-CM | POA: Diagnosis not present

## 2017-01-14 DIAGNOSIS — Z79891 Long term (current) use of opiate analgesic: Secondary | ICD-10-CM | POA: Diagnosis not present

## 2017-01-14 DIAGNOSIS — I2510 Atherosclerotic heart disease of native coronary artery without angina pectoris: Secondary | ICD-10-CM | POA: Diagnosis not present

## 2017-01-14 DIAGNOSIS — E43 Unspecified severe protein-calorie malnutrition: Secondary | ICD-10-CM | POA: Diagnosis not present

## 2017-01-15 ENCOUNTER — Emergency Department (HOSPITAL_COMMUNITY): Payer: Medicare Other

## 2017-01-15 ENCOUNTER — Encounter (HOSPITAL_COMMUNITY): Payer: Self-pay

## 2017-01-15 ENCOUNTER — Inpatient Hospital Stay (HOSPITAL_COMMUNITY)
Admission: EM | Admit: 2017-01-15 | Discharge: 2017-02-09 | DRG: 190 | Disposition: E | Payer: Medicare Other | Attending: Internal Medicine | Admitting: Internal Medicine

## 2017-01-15 DIAGNOSIS — Z955 Presence of coronary angioplasty implant and graft: Secondary | ICD-10-CM

## 2017-01-15 DIAGNOSIS — J9621 Acute and chronic respiratory failure with hypoxia: Secondary | ICD-10-CM | POA: Diagnosis present

## 2017-01-15 DIAGNOSIS — D631 Anemia in chronic kidney disease: Secondary | ICD-10-CM | POA: Diagnosis present

## 2017-01-15 DIAGNOSIS — I482 Chronic atrial fibrillation: Secondary | ICD-10-CM | POA: Diagnosis present

## 2017-01-15 DIAGNOSIS — J449 Chronic obstructive pulmonary disease, unspecified: Secondary | ICD-10-CM

## 2017-01-15 DIAGNOSIS — J9622 Acute and chronic respiratory failure with hypercapnia: Secondary | ICD-10-CM | POA: Diagnosis present

## 2017-01-15 DIAGNOSIS — F419 Anxiety disorder, unspecified: Secondary | ICD-10-CM | POA: Diagnosis present

## 2017-01-15 DIAGNOSIS — D509 Iron deficiency anemia, unspecified: Secondary | ICD-10-CM | POA: Diagnosis present

## 2017-01-15 DIAGNOSIS — Z66 Do not resuscitate: Secondary | ICD-10-CM | POA: Diagnosis present

## 2017-01-15 DIAGNOSIS — J9612 Chronic respiratory failure with hypercapnia: Secondary | ICD-10-CM

## 2017-01-15 DIAGNOSIS — D649 Anemia, unspecified: Secondary | ICD-10-CM | POA: Diagnosis present

## 2017-01-15 DIAGNOSIS — E785 Hyperlipidemia, unspecified: Secondary | ICD-10-CM | POA: Diagnosis not present

## 2017-01-15 DIAGNOSIS — Z823 Family history of stroke: Secondary | ICD-10-CM

## 2017-01-15 DIAGNOSIS — Z7982 Long term (current) use of aspirin: Secondary | ICD-10-CM | POA: Diagnosis not present

## 2017-01-15 DIAGNOSIS — I10 Essential (primary) hypertension: Secondary | ICD-10-CM | POA: Diagnosis not present

## 2017-01-15 DIAGNOSIS — I5031 Acute diastolic (congestive) heart failure: Secondary | ICD-10-CM | POA: Diagnosis not present

## 2017-01-15 DIAGNOSIS — M1711 Unilateral primary osteoarthritis, right knee: Secondary | ICD-10-CM | POA: Diagnosis not present

## 2017-01-15 DIAGNOSIS — N170 Acute kidney failure with tubular necrosis: Secondary | ICD-10-CM | POA: Diagnosis not present

## 2017-01-15 DIAGNOSIS — D539 Nutritional anemia, unspecified: Secondary | ICD-10-CM | POA: Diagnosis not present

## 2017-01-15 DIAGNOSIS — Z515 Encounter for palliative care: Secondary | ICD-10-CM

## 2017-01-15 DIAGNOSIS — E43 Unspecified severe protein-calorie malnutrition: Secondary | ICD-10-CM | POA: Diagnosis not present

## 2017-01-15 DIAGNOSIS — J189 Pneumonia, unspecified organism: Secondary | ICD-10-CM | POA: Diagnosis not present

## 2017-01-15 DIAGNOSIS — R0602 Shortness of breath: Secondary | ICD-10-CM | POA: Diagnosis not present

## 2017-01-15 DIAGNOSIS — N183 Chronic kidney disease, stage 3 (moderate): Secondary | ICD-10-CM | POA: Diagnosis present

## 2017-01-15 DIAGNOSIS — J441 Chronic obstructive pulmonary disease with (acute) exacerbation: Principal | ICD-10-CM | POA: Diagnosis present

## 2017-01-15 DIAGNOSIS — J440 Chronic obstructive pulmonary disease with acute lower respiratory infection: Secondary | ICD-10-CM | POA: Diagnosis present

## 2017-01-15 DIAGNOSIS — Z79891 Long term (current) use of opiate analgesic: Secondary | ICD-10-CM | POA: Diagnosis not present

## 2017-01-15 DIAGNOSIS — Z681 Body mass index (BMI) 19 or less, adult: Secondary | ICD-10-CM | POA: Diagnosis not present

## 2017-01-15 DIAGNOSIS — I130 Hypertensive heart and chronic kidney disease with heart failure and stage 1 through stage 4 chronic kidney disease, or unspecified chronic kidney disease: Secondary | ICD-10-CM | POA: Diagnosis not present

## 2017-01-15 DIAGNOSIS — I5032 Chronic diastolic (congestive) heart failure: Secondary | ICD-10-CM | POA: Diagnosis not present

## 2017-01-15 DIAGNOSIS — I2510 Atherosclerotic heart disease of native coronary artery without angina pectoris: Secondary | ICD-10-CM | POA: Diagnosis not present

## 2017-01-15 DIAGNOSIS — J181 Lobar pneumonia, unspecified organism: Secondary | ICD-10-CM

## 2017-01-15 DIAGNOSIS — I110 Hypertensive heart disease with heart failure: Secondary | ICD-10-CM | POA: Diagnosis not present

## 2017-01-15 DIAGNOSIS — I4891 Unspecified atrial fibrillation: Secondary | ICD-10-CM

## 2017-01-15 DIAGNOSIS — K219 Gastro-esophageal reflux disease without esophagitis: Secondary | ICD-10-CM | POA: Diagnosis not present

## 2017-01-15 DIAGNOSIS — J9611 Chronic respiratory failure with hypoxia: Secondary | ICD-10-CM | POA: Diagnosis not present

## 2017-01-15 DIAGNOSIS — E861 Hypovolemia: Secondary | ICD-10-CM | POA: Diagnosis not present

## 2017-01-15 DIAGNOSIS — Z7951 Long term (current) use of inhaled steroids: Secondary | ICD-10-CM | POA: Diagnosis not present

## 2017-01-15 DIAGNOSIS — Z8546 Personal history of malignant neoplasm of prostate: Secondary | ICD-10-CM

## 2017-01-15 DIAGNOSIS — J9601 Acute respiratory failure with hypoxia: Secondary | ICD-10-CM | POA: Diagnosis present

## 2017-01-15 DIAGNOSIS — Z87891 Personal history of nicotine dependence: Secondary | ICD-10-CM | POA: Diagnosis not present

## 2017-01-15 DIAGNOSIS — Z7189 Other specified counseling: Secondary | ICD-10-CM | POA: Diagnosis not present

## 2017-01-15 DIAGNOSIS — N179 Acute kidney failure, unspecified: Secondary | ICD-10-CM | POA: Diagnosis not present

## 2017-01-15 DIAGNOSIS — Z8249 Family history of ischemic heart disease and other diseases of the circulatory system: Secondary | ICD-10-CM

## 2017-01-15 DIAGNOSIS — R911 Solitary pulmonary nodule: Secondary | ICD-10-CM | POA: Diagnosis present

## 2017-01-15 DIAGNOSIS — R05 Cough: Secondary | ICD-10-CM | POA: Diagnosis not present

## 2017-01-15 DIAGNOSIS — I739 Peripheral vascular disease, unspecified: Secondary | ICD-10-CM | POA: Diagnosis present

## 2017-01-15 DIAGNOSIS — Z9981 Dependence on supplemental oxygen: Secondary | ICD-10-CM

## 2017-01-15 DIAGNOSIS — J431 Panlobular emphysema: Secondary | ICD-10-CM | POA: Diagnosis not present

## 2017-01-15 DIAGNOSIS — R0682 Tachypnea, not elsewhere classified: Secondary | ICD-10-CM | POA: Diagnosis not present

## 2017-01-15 LAB — URINALYSIS, ROUTINE W REFLEX MICROSCOPIC
Bacteria, UA: NONE SEEN
Bilirubin Urine: NEGATIVE
Glucose, UA: NEGATIVE mg/dL
Hgb urine dipstick: NEGATIVE
Ketones, ur: NEGATIVE mg/dL
Leukocytes, UA: NEGATIVE
Nitrite: NEGATIVE
Protein, ur: 30 mg/dL — AB
Specific Gravity, Urine: 1.011 (ref 1.005–1.030)
pH: 5 (ref 5.0–8.0)

## 2017-01-15 LAB — CBC WITH DIFFERENTIAL/PLATELET
BAND NEUTROPHILS: 0 %
BASOS PCT: 0 %
BLASTS: 0 %
Basophils Absolute: 0 10*3/uL (ref 0.0–0.1)
EOS ABS: 0 10*3/uL (ref 0.0–0.7)
EOS PCT: 0 %
HEMATOCRIT: 34.4 % — AB (ref 39.0–52.0)
Hemoglobin: 11.6 g/dL — ABNORMAL LOW (ref 13.0–17.0)
LYMPHS PCT: 1 %
Lymphs Abs: 0.3 10*3/uL — ABNORMAL LOW (ref 0.7–4.0)
MCH: 28.6 pg (ref 26.0–34.0)
MCHC: 33.7 g/dL (ref 30.0–36.0)
MCV: 84.9 fL (ref 78.0–100.0)
MONOS PCT: 6 %
Metamyelocytes Relative: 0 %
Monocytes Absolute: 1.6 10*3/uL — ABNORMAL HIGH (ref 0.1–1.0)
Myelocytes: 0 %
NEUTROS ABS: 24.5 10*3/uL — AB (ref 1.7–7.7)
Neutrophils Relative %: 93 %
OTHER: 0 %
Platelets: 269 10*3/uL (ref 150–400)
Promyelocytes Absolute: 0 %
RBC: 4.05 MIL/uL — ABNORMAL LOW (ref 4.22–5.81)
RDW: 14.2 % (ref 11.5–15.5)
WBC: 26.4 10*3/uL — ABNORMAL HIGH (ref 4.0–10.5)
nRBC: 0 /100 WBC

## 2017-01-15 LAB — I-STAT TROPONIN, ED: Troponin i, poc: 0.02 ng/mL (ref 0.00–0.08)

## 2017-01-15 LAB — BRAIN NATRIURETIC PEPTIDE: B Natriuretic Peptide: 254 pg/mL — ABNORMAL HIGH (ref 0.0–100.0)

## 2017-01-15 LAB — BASIC METABOLIC PANEL
ANION GAP: 15 (ref 5–15)
BUN: 44 mg/dL — ABNORMAL HIGH (ref 6–20)
CO2: 27 mmol/L (ref 22–32)
Calcium: 8.9 mg/dL (ref 8.9–10.3)
Chloride: 91 mmol/L — ABNORMAL LOW (ref 101–111)
Creatinine, Ser: 1.95 mg/dL — ABNORMAL HIGH (ref 0.61–1.24)
GFR calc Af Amer: 35 mL/min — ABNORMAL LOW (ref >60)
GFR, EST NON AFRICAN AMERICAN: 30 mL/min — AB (ref >60)
GLUCOSE: 99 mg/dL (ref 65–99)
POTASSIUM: 4.3 mmol/L (ref 3.5–5.1)
Sodium: 133 mmol/L — ABNORMAL LOW (ref 135–145)

## 2017-01-15 LAB — MRSA PCR SCREENING: MRSA by PCR: NEGATIVE

## 2017-01-15 LAB — STREP PNEUMONIAE URINARY ANTIGEN: STREP PNEUMO URINARY ANTIGEN: NEGATIVE

## 2017-01-15 MED ORDER — TIOTROPIUM BROMIDE MONOHYDRATE 18 MCG IN CAPS
18.0000 ug | ORAL_CAPSULE | Freq: Every day | RESPIRATORY_TRACT | Status: DC
  Administered 2017-01-16: 18 ug via RESPIRATORY_TRACT
  Filled 2017-01-15: qty 5

## 2017-01-15 MED ORDER — ACETAMINOPHEN 650 MG RE SUPP: 650.0000 mg | Freq: Four times a day (QID) | RECTAL | Status: DC | PRN

## 2017-01-15 MED ORDER — ENOXAPARIN SODIUM 40 MG/0.4ML ~~LOC~~ SOLN
40.0000 mg | Freq: Once | SUBCUTANEOUS | Status: AC
  Administered 2017-01-15: 40 mg via SUBCUTANEOUS
  Filled 2017-01-15: qty 0.4

## 2017-01-15 MED ORDER — GUAIFENESIN ER 600 MG PO TB12
1200.0000 mg | ORAL_TABLET | Freq: Two times a day (BID) | ORAL | Status: DC
  Administered 2017-01-15 – 2017-01-16 (×3): 1200 mg via ORAL
  Filled 2017-01-15 (×3): qty 2

## 2017-01-15 MED ORDER — VANCOMYCIN HCL IN DEXTROSE 1-5 GM/200ML-% IV SOLN
1000.0000 mg | Freq: Once | INTRAVENOUS | Status: AC
  Administered 2017-01-15: 1000 mg via INTRAVENOUS
  Filled 2017-01-15: qty 200

## 2017-01-15 MED ORDER — LORAZEPAM 0.5 MG PO TABS
0.5000 mg | ORAL_TABLET | Freq: Once | ORAL | Status: AC
  Administered 2017-01-15: 0.5 mg via ORAL
  Filled 2017-01-15: qty 1

## 2017-01-15 MED ORDER — ALBUTEROL SULFATE (2.5 MG/3ML) 0.083% IN NEBU
2.5000 mg | INHALATION_SOLUTION | RESPIRATORY_TRACT | Status: DC | PRN
  Administered 2017-01-15 – 2017-01-16 (×2): 2.5 mg via RESPIRATORY_TRACT
  Filled 2017-01-15 (×2): qty 3

## 2017-01-15 MED ORDER — SODIUM CHLORIDE 0.9 % IV BOLUS (SEPSIS)
500.0000 mL | Freq: Once | INTRAVENOUS | Status: AC
  Administered 2017-01-15: 500 mL via INTRAVENOUS

## 2017-01-15 MED ORDER — TORSEMIDE 20 MG PO TABS
20.0000 mg | ORAL_TABLET | ORAL | Status: DC
  Administered 2017-01-16: 20 mg via ORAL
  Filled 2017-01-15: qty 1

## 2017-01-15 MED ORDER — VANCOMYCIN HCL 500 MG IV SOLR: 500.0000 mg | INTRAVENOUS | Status: DC

## 2017-01-15 MED ORDER — DILTIAZEM HCL 100 MG IV SOLR
5.0000 mg/h | INTRAVENOUS | Status: DC
  Administered 2017-01-15: 10 mg/h via INTRAVENOUS
  Administered 2017-01-15: 5 mg/h via INTRAVENOUS
  Administered 2017-01-16 – 2017-01-17 (×4): 10 mg/h via INTRAVENOUS
  Filled 2017-01-15 (×6): qty 100

## 2017-01-15 MED ORDER — SPIRONOLACTONE 25 MG PO TABS: 25.0000 mg | ORAL_TABLET | Freq: Every day | ORAL | Status: DC

## 2017-01-15 MED ORDER — SPIRONOLACTONE 25 MG PO TABS
25.0000 mg | ORAL_TABLET | Freq: Every day | ORAL | Status: DC
  Administered 2017-01-16: 25 mg via ORAL
  Filled 2017-01-15: qty 1

## 2017-01-15 MED ORDER — ACETAMINOPHEN 325 MG PO TABS: 650.0000 mg | ORAL_TABLET | Freq: Four times a day (QID) | ORAL | Status: DC | PRN

## 2017-01-15 MED ORDER — SENNOSIDES-DOCUSATE SODIUM 8.6-50 MG PO TABS: 1.0000 | ORAL_TABLET | Freq: Every evening | ORAL | Status: DC | PRN

## 2017-01-15 MED ORDER — ENOXAPARIN SODIUM 30 MG/0.3ML ~~LOC~~ SOLN
30.0000 mg | SUBCUTANEOUS | Status: DC
  Administered 2017-01-15 – 2017-01-16 (×2): 30 mg via SUBCUTANEOUS
  Filled 2017-01-15 (×2): qty 0.3

## 2017-01-15 MED ORDER — DEXTROSE 5 % IV SOLN
1.0000 g | INTRAVENOUS | Status: DC
  Administered 2017-01-15: 1 g via INTRAVENOUS
  Filled 2017-01-15 (×2): qty 1

## 2017-01-15 MED ORDER — ONDANSETRON HCL 4 MG PO TABS: 4.0000 mg | ORAL_TABLET | Freq: Four times a day (QID) | ORAL | Status: DC | PRN

## 2017-01-15 MED ORDER — ALBUTEROL SULFATE HFA 108 (90 BASE) MCG/ACT IN AERS: 2.0000 | INHALATION_SPRAY | RESPIRATORY_TRACT | Status: DC | PRN

## 2017-01-15 MED ORDER — ACETAMINOPHEN 325 MG PO TABS: 325.0000 mg | ORAL_TABLET | Freq: Four times a day (QID) | ORAL | Status: DC | PRN

## 2017-01-15 MED ORDER — PANTOPRAZOLE SODIUM 40 MG PO TBEC
40.0000 mg | DELAYED_RELEASE_TABLET | Freq: Every day | ORAL | Status: DC
  Administered 2017-01-16: 40 mg via ORAL
  Filled 2017-01-15: qty 1

## 2017-01-15 MED ORDER — BISACODYL 10 MG RE SUPP: 10.0000 mg | Freq: Every day | RECTAL | Status: DC | PRN

## 2017-01-15 MED ORDER — ORAL CARE MOUTH RINSE
15.0000 mL | Freq: Two times a day (BID) | OROMUCOSAL | Status: DC
  Administered 2017-01-15 – 2017-01-16 (×3): 15 mL via OROMUCOSAL

## 2017-01-15 MED ORDER — TIOTROPIUM BROMIDE MONOHYDRATE 2.5 MCG/ACT IN AERS: 2.0000 | INHALATION_SPRAY | Freq: Every day | RESPIRATORY_TRACT | Status: DC

## 2017-01-15 MED ORDER — SIMVASTATIN 20 MG PO TABS
20.0000 mg | ORAL_TABLET | Freq: Every day | ORAL | Status: DC
  Administered 2017-01-15: 20 mg via ORAL
  Filled 2017-01-15: qty 1

## 2017-01-15 MED ORDER — SODIUM CHLORIDE 0.9 % IV SOLN: Freq: Once | INTRAVENOUS | Status: DC

## 2017-01-15 MED ORDER — FLUTICASONE PROPIONATE 50 MCG/ACT NA SUSP
1.0000 | Freq: Every day | NASAL | Status: DC
  Administered 2017-01-16: 1 via NASAL
  Filled 2017-01-15: qty 16

## 2017-01-15 MED ORDER — DILTIAZEM LOAD VIA INFUSION
15.0000 mg | Freq: Once | INTRAVENOUS | Status: AC
  Administered 2017-01-15: 15 mg via INTRAVENOUS
  Filled 2017-01-15: qty 15

## 2017-01-15 MED ORDER — TRAMADOL HCL 50 MG PO TABS
50.0000 mg | ORAL_TABLET | Freq: Four times a day (QID) | ORAL | Status: DC | PRN
  Administered 2017-01-15 – 2017-01-16 (×3): 50 mg via ORAL
  Filled 2017-01-15 (×3): qty 1

## 2017-01-15 MED ORDER — ASPIRIN 81 MG PO CHEW
324.0000 mg | CHEWABLE_TABLET | Freq: Once | ORAL | Status: AC
  Administered 2017-01-15: 324 mg via ORAL
  Filled 2017-01-15: qty 4

## 2017-01-15 MED ORDER — ONDANSETRON HCL 4 MG/2ML IJ SOLN: 4.0000 mg | Freq: Four times a day (QID) | INTRAMUSCULAR | Status: DC | PRN

## 2017-01-15 MED ORDER — MOMETASONE FURO-FORMOTEROL FUM 200-5 MCG/ACT IN AERO
2.0000 | INHALATION_SPRAY | Freq: Two times a day (BID) | RESPIRATORY_TRACT | Status: DC
  Administered 2017-01-15 – 2017-01-16 (×3): 2 via RESPIRATORY_TRACT
  Filled 2017-01-15: qty 8.8

## 2017-01-15 MED ORDER — ISOSORBIDE MONONITRATE ER 30 MG PO TB24
60.0000 mg | ORAL_TABLET | Freq: Every day | ORAL | Status: DC
  Administered 2017-01-16: 60 mg via ORAL
  Filled 2017-01-15: qty 2

## 2017-01-15 MED ORDER — METOPROLOL SUCCINATE ER 25 MG PO TB24
12.5000 mg | ORAL_TABLET | Freq: Two times a day (BID) | ORAL | Status: DC
  Administered 2017-01-15 – 2017-01-16 (×3): 12.5 mg via ORAL
  Filled 2017-01-15 (×5): qty 1

## 2017-01-15 MED ORDER — MORPHINE SULFATE (PF) 4 MG/ML IV SOLN
2.0000 mg | Freq: Once | INTRAVENOUS | Status: AC
  Administered 2017-01-15: 2 mg via INTRAVENOUS
  Filled 2017-01-15: qty 1
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
## 2017-01-15 NOTE — Progress Notes (Signed)
Pharmacy Antibiotic Note  Cristian Collins is a 83 y.o. male admitted on 02/07/2017 with pneumonia. Pharmacy has been consulted to dose cefepime, now to add vancomycin. Noted increase in SCr to 1.95 (baseline ~1.1). Patient is afebrile and WBC is pending. Pt has already received one dose of vancomycin 1g in the ED at 1545. Current crcl < 20 ml/min  Plan: Vancomycin 500 mg IV Q 48hrs, next dose on 6/8 PM. F/u renal function change, adjust dose if needed Continue cefepime 1g Q 24hrs Monitor renal function, clinical picture, and cultures Follow-up length of therapy   Height: 5\' 6"  (167.6 cm) Weight: 95 lb (43.1 kg) IBW/kg (Calculated) : 63.8  Temp (24hrs), Avg:98.1 F (36.7 C), Min:98.1 F (36.7 C), Max:98.1 F (36.7 C)   Recent Labs Lab 01/09/17 0733 01/10/17 0456 01/19/2017 1417  WBC  --  6.0 26.4*  CREATININE 1.18 0.95 1.95*    Estimated Creatinine Clearance: 17.2 mL/min (A) (by C-G formula based on SCr of 1.95 mg/dL (H)).    No Known Allergies  Antimicrobials this admission: 6/6 Vanc >> 6/6 Cefepime >>   Dose adjustments this admission: n/a   Microbiology results: pending   Maryanna Shape, PharmD, BCPS  Clinical Pharmacist  Pager: [phone]   02/04/2017 5:32 PM

## 2017-01-15 NOTE — ED Notes (Signed)
Attempted report x1. 

## 2017-01-15 NOTE — Progress Notes (Signed)
Pharmacy Antibiotic Note  Cristian Collins is a 83 y.o. male admitted on 02/03/2017 with pneumonia. Pharmacy has been consulted for cefepime dosing. Noted increase in SCr to 1.95 (baseline ~1.1). Patient is afebrile and WBC is pending.   Plan: Cefepime 1g IV q24hr  Vancomycin 1g IV x1 per MD  Monitor renal function, clinical picture, and cultures Follow-up length of therapy   Height: 5\' 6"  (167.6 cm) Weight: 95 lb (43.1 kg) IBW/kg (Calculated) : 63.8  Temp (24hrs), Avg:98.1 F (36.7 C), Min:98.1 F (36.7 C), Max:98.1 F (36.7 C)   Recent Labs Lab 01/09/17 0733 01/10/17 0456 01/12/2017 1417  WBC  --  6.0 PENDING  CREATININE 1.18 0.95 1.95*    Estimated Creatinine Clearance: 17.2 mL/min (A) (by C-G formula based on SCr of 1.95 mg/dL (H)).    No Known Allergies  Antimicrobials this admission: 6/6 Vanc x1  6/6 Cefepime >>   Dose adjustments this admission: n/a   Microbiology results: pending   Argie Ramming, PharmD Pharmacy Resident  Pager [phone] 01/14/2017 3:10 PM
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
## 2017-01-15 NOTE — ED Triage Notes (Signed)
Pt brought in by EMS due to having SOB that started at 3 a.m this morning. Pt wears 3L of O2 at home. Pt endorse cough for a couple of days. Pt taken breathing treatment at home without relief. Pt has hx of anxiety.

## 2017-01-15 NOTE — Consult Note (Signed)
   [facility] CM Inpatient Consult   02/05/2017  MADDEX GARLITZ April 26, 1930 [id number]    Received telephone call from Tammy with [facility] (home based outpatient palliative care program administered by [facility]) at [phone] who states Mr. Zambito is active with [facility] program.  Made ED RNCM aware.   Marthenia Rolling, MSN-Ed, RN,BSN [facility] Liaison [phone]

## 2017-01-16 ENCOUNTER — Inpatient Hospital Stay (HOSPITAL_COMMUNITY): Payer: Medicare Other

## 2017-01-16 DIAGNOSIS — J189 Pneumonia, unspecified organism: Secondary | ICD-10-CM

## 2017-01-16 DIAGNOSIS — R0602 Shortness of breath: Secondary | ICD-10-CM

## 2017-01-16 DIAGNOSIS — J9601 Acute respiratory failure with hypoxia: Secondary | ICD-10-CM

## 2017-01-16 DIAGNOSIS — J181 Lobar pneumonia, unspecified organism: Secondary | ICD-10-CM

## 2017-01-16 LAB — COMPREHENSIVE METABOLIC PANEL
ALK PHOS: 60 U/L (ref 38–126)
ALT: 15 U/L — ABNORMAL LOW (ref 17–63)
ANION GAP: 13 (ref 5–15)
AST: 28 U/L (ref 15–41)
Albumin: 1.9 g/dL — ABNORMAL LOW (ref 3.5–5.0)
BILIRUBIN TOTAL: 1.1 mg/dL (ref 0.3–1.2)
BUN: 43 mg/dL — ABNORMAL HIGH (ref 6–20)
CALCIUM: 7.7 mg/dL — AB (ref 8.9–10.3)
CO2: 27 mmol/L (ref 22–32)
Chloride: 96 mmol/L — ABNORMAL LOW (ref 101–111)
Creatinine, Ser: 1.56 mg/dL — ABNORMAL HIGH (ref 0.61–1.24)
GFR, EST AFRICAN AMERICAN: 45 mL/min — AB (ref >60)
GFR, EST NON AFRICAN AMERICAN: 39 mL/min — AB (ref >60)
Glucose, Bld: 67 mg/dL (ref 65–99)
Potassium: 3.6 mmol/L (ref 3.5–5.1)
Sodium: 136 mmol/L (ref 135–145)
TOTAL PROTEIN: 4.9 g/dL — AB (ref 6.5–8.1)

## 2017-01-16 LAB — CBC
HEMATOCRIT: 30.3 % — AB (ref 39.0–52.0)
HEMOGLOBIN: 10.1 g/dL — AB (ref 13.0–17.0)
MCH: 28.9 pg (ref 26.0–34.0)
MCHC: 33.3 g/dL (ref 30.0–36.0)
MCV: 86.8 fL (ref 78.0–100.0)
PLATELETS: 205 10*3/uL (ref 150–400)
RBC: 3.49 MIL/uL — AB (ref 4.22–5.81)
RDW: 14.6 % (ref 11.5–15.5)
WBC: 12.5 10*3/uL — ABNORMAL HIGH (ref 4.0–10.5)

## 2017-01-16 LAB — PROTIME-INR
INR: 1.28
Prothrombin Time: 16.1 seconds — ABNORMAL HIGH (ref 11.4–15.2)

## 2017-01-16 MED ORDER — LORAZEPAM 0.5 MG PO TABS
0.5000 mg | ORAL_TABLET | ORAL | Status: DC | PRN
  Administered 2017-01-16: 0.5 mg via ORAL
  Filled 2017-01-16: qty 1

## 2017-01-16 MED ORDER — IPRATROPIUM-ALBUTEROL 0.5-2.5 (3) MG/3ML IN SOLN
3.0000 mL | Freq: Four times a day (QID) | RESPIRATORY_TRACT | Status: DC
  Administered 2017-01-16 – 2017-01-17 (×8): 3 mL via RESPIRATORY_TRACT
  Filled 2017-01-16 (×8): qty 3

## 2017-01-16 MED ORDER — ALBUTEROL SULFATE (2.5 MG/3ML) 0.083% IN NEBU: 2.5000 mg | INHALATION_SOLUTION | RESPIRATORY_TRACT | Status: DC | PRN

## 2017-01-16 MED ORDER — CLONAZEPAM 0.5 MG PO TABS
0.5000 mg | ORAL_TABLET | Freq: Two times a day (BID) | ORAL | Status: DC
  Administered 2017-01-16 (×2): 0.5 mg via ORAL
  Filled 2017-01-16 (×2): qty 1

## 2017-01-16 MED ORDER — LORAZEPAM 2 MG/ML IJ SOLN
0.5000 mg | INTRAMUSCULAR | Status: DC | PRN
  Administered 2017-01-17: 0.5 mg via INTRAVENOUS
  Filled 2017-01-16: qty 1

## 2017-01-16 MED ORDER — ENSURE ENLIVE PO LIQD
237.0000 mL | Freq: Three times a day (TID) | ORAL | Status: DC
  Administered 2017-01-16: 237 mL via ORAL

## 2017-01-16 MED ORDER — METHYLPREDNISOLONE SODIUM SUCC 40 MG IJ SOLR
40.0000 mg | Freq: Four times a day (QID) | INTRAMUSCULAR | Status: DC
  Administered 2017-01-16 – 2017-01-17 (×4): 40 mg via INTRAVENOUS
  Filled 2017-01-16 (×4): qty 1

## 2017-01-16 MED ORDER — SODIUM CHLORIDE 0.9 % IV SOLN
INTRAVENOUS | Status: DC
  Administered 2017-01-16: 50 mL/h via INTRAVENOUS

## 2017-01-16 NOTE — Progress Notes (Signed)
Palliative Care  Cristian Collins was not able to meaningfully participate in a goals of care conversation. He was very short of breath, anxious, and was still rather lethargic after receiving ativan. His daughters were at the bedside. I spoke with them about my concerns regarding his respiratory status. I am very concerned he will tire and go into respiratory distress. Per their understanding of his wishes, expressed in the past few months, he would want "everything done" with escalation to the ICU and intubation. I expressed my strong reservations about this plan, emphasizing the suffering it would cause and the low likelihood of meaningful recovery. They were very receptive to this, but needed to speak with the patient's wife before any descions could be made. Furthermore, they asked to have a family meeting tomorrow when everyone important could be present to discuss Cristian Collins's situation, options for care, and code status. As Cristian Collins is not decisional currently, decisions would fall to his wife. Per their daughters, she seeks concensus from the family.   Plan -Family meeting 6/8 at 1300 -Full code for now  Charlynn Court [facility] [phone]  No charge note.

## 2017-01-16 NOTE — Progress Notes (Signed)
Initial Nutrition Assessment  DOCUMENTATION CODES:   Severe malnutrition in context of chronic illness, Underweight  INTERVENTION:   -Ensure Enlive po TID, each supplement provides 350 kcal and 20 grams of protein  NUTRITION DIAGNOSIS:   Malnutrition (Chronic illness) related to chronic illness (COPD) as evidenced by energy intake < or equal to 75% for > or equal to 1 month, severe depletion of body fat, severe depletion of muscle mass.  GOAL:   Patient will meet greater than or equal to 90% of their needs  MONITOR:   PO intake, Supplement acceptance, Labs, Weight trends, Skin, I & O's  REASON FOR ASSESSMENT:   Malnutrition Screening Tool    ASSESSMENT:   Cristian Collins is a 83 y.o. male with medical history significant for COPD on 3 L at home of oxygen, CAD status post stents, CHF, presenting to the emergency department after discharge several days ago from the hospital, for COPD exacerbation.   Pt admitted with respiratory failure with hx of COPD, known lung module, and dx of PNA.   Case discussed with RN prior to visit, who reports pt eating very little. Pt has been having a lot of anxiety in relation to respiratory status; RN reports ativan was just administered prior to visit. Pt familiar to this RD, related to multiple prior admissions.   Spoke with pt daughter, who reports pt's intake continues to be very minimal. Pt follows a liberalized diet at home, which consists of pt's favorite foods (applesauce, [employer], and mashed potatoes). Pt daughter states "he eats just enough to keep himself alive". Pt also consumed 2-3 Ensure supplements daily PTA.   Pt daughter shares pt has progressively lost weight over the past several years. Noted a 15# (14.1%) wt loss over the past year, which is not significant, however, concerning given pt's chronic malnourished state.   Nutrition-Focused physical exam completed. Findings are severe fat depletion (upper arm, orbital), severe muscle  depletion (temple, clavicle, acromiom, scapula, hand, patella, thigh, calf), and no edema.   Pt is followed by hospice; palliative care consult pending.   Labs reviewed.   Diet Order:  Diet heart healthy/carb modified Room service appropriate? Yes; Fluid consistency: Thin  Skin:  Reviewed, no issues  Last BM:  01/19/2017  Height:   Ht Readings from Last 1 Encounters:  01/16/2017 5\' 6"  (1.676 m)    Weight:   Wt Readings from Last 1 Encounters:  01/16/17 91 lb 11.4 oz (41.6 kg)    Ideal Body Weight:  64.5 kg  BMI:  Body mass index is 14.8 kg/m.  Estimated Nutritional Needs:   Kcal:  1450-1650  Protein:  70-85 grams  Fluid:  1.4-1.6 L  EDUCATION NEEDS:   No education needs identified at this time  Sophiana Milanese A. Jimmye Norman, RD, LDN, CDE Pager: [phone] After hours Pager: [phone]

## 2017-01-16 NOTE — Care Management Note (Addendum)
Case Management Note  Patient Details  Name: Cristian Collins MRN: [id number] Date of Birth: 06-24-1930  Subjective/Objective:  Pt admitted with A fib                  Action/Plan:  Pt is a readmit.  Pt recently discharged home with [facility] providing PT and OT.  Pt also uses [facility] for home oxygen (3L).  CM verified with [facility] - should pt need HHRN at discharge agency would provide - CM requested resumption/new orders via physician sticky tab.     Expected Discharge Date:  01/20/17               Expected Discharge Plan:  [place]  In-House Referral:     Discharge planning Services  CM Consult  Post Acute Care Choice:    Choice offered to:     DME Arranged:    DME Agency:     HH Arranged:    HH Agency:     Status of Service:     If discussed at [employer], dates discussed:    Additional Comments: CM received call from Ralph Leyden with [facility] [phone])  - Palliative services already contracted via [facility] for pt in the home and agency continues to follow pt. [facility] is not currently active with pt.  [facility] program is administered through [facility].   Resource requested palliative consult while admitted to continue discussions related to goals of care - consult ordered Maryclare Labrador, RN 01/16/2017, 9:43 AM

## 2017-01-16 NOTE — Progress Notes (Signed)
[facility]  Patient Status: Active (receiving services up to time of hospitalization)  [facility] is providing the following services: PT and MSW  If patient discharges after hours, please call [phone].   Cristian Collins 01/16/2017, 9:27 AM
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
## 2017-01-16 NOTE — Evaluation (Signed)
Physical Therapy Evaluation Patient Details Name: Cristian Collins MRN: [id number] DOB: 1930/04/18 Today's Date: 01/16/2017   History of Present Illness  Cristian Collins is a 83 y.o. male with a Past Medical History significant for COPD, CHF, coronary artery disease who presents with sob and afib with RVR.    Clinical Impression  Pt admitted with above diagnosis. Pt currently with functional limitations due to the deficits listed below (see PT Problem List). Pt was able to get to chair with +2 mod assist stand pivot. May need SNF as wife and daughter may have trouble caring for pt at current level. Will follow acutely.   Pt will benefit from skilled PT to increase their independence and safety with mobility to allow discharge to the venue listed below.      Follow Up Recommendations SNF;Supervision/Assistance - 24 hour    Equipment Recommendations  None recommended by PT    Recommendations for Other Services       Precautions / Restrictions Precautions Precautions: Fall Restrictions Weight Bearing Restrictions: No      Mobility  Bed Mobility Overal bed mobility: Needs Assistance Bed Mobility: Supine to Sit     Supine to sit: Mod assist     General bed mobility comments: incr time to come to EOB. assist for elevation of trunk and scooting to EOB with pad.   Transfers Overall transfer level: Needs assistance Equipment used: 2 person hand held assist Transfers: Sit to/from [facility] Sit to Stand: Min assist;+2 physical assistance;From elevated surface;Min guard Stand pivot transfers: Mod assist;Min assist;+2 physical assistance       General transfer comment: Pt stood with less assist min to min guard but then needing steadying to min assist to turn and get to the recliner.  LOB noted as he took pivotal steps and pt definitely needed bil UE support.   Ambulation/Gait             General Gait Details: Too SOB to ambulate.  Stairs             Wheelchair Mobility    Modified Rankin (Stroke Patients Only)       Balance Overall balance assessment: Needs assistance;History of Falls Sitting-balance support: No upper extremity supported;Feet supported Sitting balance-Leahy Scale: Fair     Standing balance support: Bilateral upper extremity supported;During functional activity Standing balance-Leahy Scale: Poor Standing balance comment: relies on bil UE support for balance.                              Pertinent Vitals/Pain Pain Assessment: Faces Faces Pain Scale: Hurts little more Pain Location: abdomen Pain Descriptors / Indicators: Sore Pain Intervention(s): Limited activity within patient's tolerance;Monitored during session;Premedicated before session;Repositioned    Home Living Family/patient expects to be discharged to:: Private residence Living Arrangements: Spouse/significant other;Children Available Help at Discharge: Family;Available 24 hours/day Type of Home: House Home Access: Stairs to enter Entrance Stairs-Rails: None Entrance Stairs-Number of Steps: 1 (half step) and a threshold Home Layout: One level Home Equipment: Walker - 2 wheels;Cane - single point;Bedside commode;Cane - quad;Grab bars - tub/shower;Tub bench Additional Comments: Pt reports he sometimes sponge bathes, sometimes showers.     Prior Function Level of Independence: Needs assistance   Gait / Transfers Assistance Needed: Uses cane more than RW.  RW at times outside of home.  ADL's / Homemaking Assistance Needed: Pt reports he performs ADLs mod I  Comments: Pt with h/o falls  Hand Dominance   Dominant Hand: Right    Extremity/Trunk Assessment   Upper Extremity Assessment Upper Extremity Assessment: Defer to OT evaluation    Lower Extremity Assessment Lower Extremity Assessment: Generalized weakness    Cervical / Trunk Assessment Cervical / Trunk Assessment: Kyphotic  Communication   Communication:  No difficulties  Cognition Arousal/Alertness: Awake/alert Behavior During Therapy: WFL for tasks assessed/performed Overall Cognitive Status: Within Functional Limits for tasks assessed                                        General Comments General comments (skin integrity, edema, etc.): Pt sats remained >96% on 3LO2 but pt RR up to 36 with DOE 4/4.      Exercises     Assessment/Plan    PT Assessment Patient needs continued PT services  PT Problem List Decreased strength;Decreased range of motion;Decreased activity tolerance;Decreased balance;Decreased mobility;Decreased knowledge of use of DME;Pain;Decreased safety awareness;Decreased knowledge of precautions       PT Treatment Interventions DME instruction;Gait training;Functional mobility training;Stair training;Therapeutic activities;Therapeutic exercise;Balance training;Patient/family education    PT Goals (Current goals can be found in the Care Plan section)  Acute Rehab PT Goals Patient Stated Goal: to gohome PT Goal Formulation: With patient Time For Goal Achievement: 01/30/17 Potential to Achieve Goals: Good    Frequency Min 3X/week   Barriers to discharge Decreased caregiver support (wife feeble as well)      Co-evaluation               AM-PAC PT "6 Clicks" Daily Activity  Outcome Measure Difficulty turning over in bed (including adjusting bedclothes, sheets and blankets)?: Total Difficulty moving from lying on back to sitting on the side of the bed? : Total Difficulty sitting down on and standing up from a chair with arms (e.g., wheelchair, bedside commode, etc,.)?: Total Help needed moving to and from a bed to chair (including a wheelchair)?: Total Help needed walking in hospital room?: Total Help needed climbing 3-5 steps with a railing? : Total 6 Click Score: 6    End of Session Equipment Utilized During Treatment: Gait belt;Oxygen Activity Tolerance: Patient limited by  fatigue Patient left: in chair;with call bell/phone within reach;with chair alarm set Nurse Communication: Mobility status PT Visit Diagnosis: Repeated falls (R29.6);Muscle weakness (generalized) (M62.81)    Time: 1201-1220 PT Time Calculation (min) (ACUTE ONLY): 19 min   Charges:   PT Evaluation $PT Eval Moderate Complexity: 1 Procedure     PT G Codes:   PT G-Codes **NOT FOR INPATIENT CLASS** Functional Assessment Tool Used: AM-PAC 6 Clicks Basic Mobility Functional Limitation: Mobility: Walking and moving around Mobility: Walking and Moving Around Current Status (F5379): At least 40 percent but less than 60 percent impaired, limited or restricted Mobility: Walking and Moving Around Goal Status [phone]): At least 1 percent but less than 20 percent impaired, limited or restricted    [place] [phone] (pager)   Denice Paradise 01/16/2017, 2:11 PM

## 2017-01-16 NOTE — Progress Notes (Signed)
[facility] TEAM 1 - Stepdown/ICU TEAM  Cristian Collins  HGD:[id number] DOB: Dec 13, 1929 DOA: 01/26/2017 PCP: Glendale Chard, MD    Brief Narrative:  83 y.o. male with hx of COPD on 3L O2, CAD status post stents, and CHF who was d/c from the hospital 01/10/17 after tx for a COPD exacerbation.  He returned to the ED w/ acute severe SOB w/ wheezing.    Subjective: The patient is very tachypneic and very anxious at the time of my evaluation.  He appears quite uncomfortable and his respiratory mechanics are very poor at this time.  He denies chest pain nausea vomiting or abdominal pain.  Assessment & Plan:  Acute recurrent on chronic hypoxic respiratory failure with severe oxygen dependent COPD Anxiety appears to be a significant contributor to his poor respiratory mechanics - I will attempt to better control his anxiety while also attempting to avoid oversedation - maximal medical therapy will continue in regard to bronchospasm/emphysema  Multifocal PNA - subacute  Present during prior admit 5/25 > 6/1 - antibiotic coverage resumed - follow clinically - doubt this represents a recrudescence of infection  Chronic atrial fibrillation with acute RVR RVR-related to acute respiratory distress and anxiety - adjust medical treatment and attempt to correct root problems  HTN Blood pressure currently well controlled  HLD  Acute kidney injury on chronic kidney disease baseline creatinine 0.95 - likely related to hypovolemia - follow with gentle hydration  Anemia of chronic kidney disease Stable - no evidence of acute blood loss  Diastolic congestive heart failure Normal systolic function with grade 1 diastolic dysfunction via TTE 01/09/2017 - no evidence of significant volume overload  CAD  Severe protein calorie malnutrition with deconditioning   DVT prophylaxis: Lovenox Code Status: FULL CODE Family Communication: Spoke with 2 daughters at bedside Disposition Plan: Stepdown  unit  Consultants:  None  Procedures: None  Antimicrobials:  Cefepime 6/6 > Vancomycin 6/6   Objective: Blood pressure (!) 108/56, pulse (!) 110, temperature 97.6 F (36.4 C), resp. rate (!) 22, height 5\' 6"  (1.676 m), weight 41.6 kg (91 lb 11.4 oz), SpO2 99 %.  Intake/Output Summary (Last 24 hours) at 01/16/17 1445 Last data filed at 01/16/17 1400  Gross per 24 hour  Intake              350 ml  Output              550 ml  Net             -200 ml   Filed Weights   01/30/2017 1342 01/19/2017 1902 01/16/17 0406  Weight: 43.1 kg (95 lb) 40.9 kg (90 lb 2.7 oz) 41.6 kg (91 lb 11.4 oz)    Examination: General: Obvious acute respiratory distress with tachypnea and poor respiratory mechanics Lungs: Very poor air movement throughout all fields with active expiratory wheezing  Cardiovascular: Tachycardic and irregular without rub Abdomen: Nontender, nondistended, soft, bowel sounds positive, no rebound, no ascites, no appreciable mass - very thin Extremities: No significant edema bilateral lower extremities  CBC:  Recent Labs Lab 01/10/17 0456 01/29/2017 1417 01/16/17 0329  WBC 6.0 26.4* 12.5*  NEUTROABS  --  24.5*  --   HGB 9.0* 11.6* 10.1*  HCT 28.2* 34.4* 30.3*  MCV 86.5 84.9 86.8  PLT 256 269 962   Basic Metabolic Panel:  Recent Labs Lab 01/10/17 0456 02/01/2017 1417 01/16/17 0503  NA 137 133* 136  K 4.0 4.3 3.6  CL 97* 91* 96*  CO2 33* 27 27  GLUCOSE 87 99 67  BUN 17 44* 43*  CREATININE 0.95 1.95* 1.56*  CALCIUM 8.7* 8.9 7.7*   GFR: Estimated Creatinine Clearance: 20.7 mL/min (A) (by C-G formula based on SCr of 1.56 mg/dL (H)).  Liver Function Tests:  Recent Labs Lab 01/16/17 0503  AST 28  ALT 15*  ALKPHOS 60  BILITOT 1.1  PROT 4.9*  ALBUMIN 1.9*    Coagulation Profile:  Recent Labs Lab 01/16/17 0329  INR 1.28    HbA1C: Hgb A1c MFr Bld  Date/Time Value Ref Range Status  11/22/2010 08:29 PM (H) <5.7 % Final   6.6 (NOTE)                                                                        According to the ADA Clinical Practice Recommendations for 2011, when HbA1c is used as a screening test:   >=6.5%   Diagnostic of Diabetes Mellitus           (if abnormal result  is confirmed)  5.7-6.4%   Increased risk of developing Diabetes Mellitus  References:Diagnosis and Classification of Diabetes Mellitus,Diabetes HDQQ,2297,98(XQJJH 1):S62-S69 and Standards of Medical Care in         Diabetes - 2011,Diabetes ERDE,0814,48  (Suppl 1):S11-S61.     Recent Results (from the past 240 hour(s))  MRSA PCR Screening     Status: None   Collection Time: 01/19/2017  4:55 PM  Result Value Ref Range Status   MRSA by PCR NEGATIVE NEGATIVE Final    Comment:        The GeneXpert MRSA Assay (FDA approved for NASAL specimens only), is one component of a comprehensive MRSA colonization surveillance program. It is not intended to diagnose MRSA infection nor to guide or monitor treatment for MRSA infections.      Scheduled Meds: . enoxaparin (LOVENOX) injection  30 mg Subcutaneous Q24H  . feeding supplement (ENSURE ENLIVE)  237 mL Oral TID BM  . fluticasone  1 spray Each Nare Daily  . guaiFENesin  1,200 mg Oral BID  . ipratropium-albuterol  3 mL Nebulization QID  . isosorbide mononitrate  60 mg Oral Daily  . mouth rinse  15 mL Mouth Rinse BID  . metoprolol succinate  12.5 mg Oral BID  . mometasone-formoterol  2 puff Inhalation BID  . pantoprazole  40 mg Oral Daily  . simvastatin  20 mg Oral QHS  . spironolactone  25 mg Oral Daily  . tiotropium  18 mcg Inhalation Daily  . torsemide  20 mg Oral QODAY     LOS: 1 day   Cherene Altes, MD [facility] Office  [phone] Pager - Text Page per Amion as per below:  On-Call/Text Page:      [email or web address]      password [email or web address]  If 7PM-7AM, please contact night-coverage [email or web address] Password [email or web address] 01/16/2017, 2:45 PM

## 2017-01-17 DIAGNOSIS — E43 Unspecified severe protein-calorie malnutrition: Secondary | ICD-10-CM

## 2017-01-17 DIAGNOSIS — J9621 Acute and chronic respiratory failure with hypoxia: Secondary | ICD-10-CM

## 2017-01-17 DIAGNOSIS — Z515 Encounter for palliative care: Secondary | ICD-10-CM

## 2017-01-17 DIAGNOSIS — Z7189 Other specified counseling: Secondary | ICD-10-CM

## 2017-01-17 DIAGNOSIS — N170 Acute kidney failure with tubular necrosis: Secondary | ICD-10-CM

## 2017-01-17 DIAGNOSIS — I482 Chronic atrial fibrillation: Secondary | ICD-10-CM

## 2017-01-17 DIAGNOSIS — J431 Panlobular emphysema: Secondary | ICD-10-CM

## 2017-01-17 DIAGNOSIS — N183 Chronic kidney disease, stage 3 (moderate): Secondary | ICD-10-CM

## 2017-01-17 DIAGNOSIS — J189 Pneumonia, unspecified organism: Secondary | ICD-10-CM

## 2017-01-17 DIAGNOSIS — J449 Chronic obstructive pulmonary disease, unspecified: Secondary | ICD-10-CM

## 2017-01-17 LAB — BASIC METABOLIC PANEL
Anion gap: 13 (ref 5–15)
BUN: 65 mg/dL — AB (ref 6–20)
CALCIUM: 7.7 mg/dL — AB (ref 8.9–10.3)
CHLORIDE: 98 mmol/L — AB (ref 101–111)
CO2: 26 mmol/L (ref 22–32)
CREATININE: 2.12 mg/dL — AB (ref 0.61–1.24)
GFR calc Af Amer: 31 mL/min — ABNORMAL LOW (ref >60)
GFR calc non Af Amer: 27 mL/min — ABNORMAL LOW (ref >60)
GLUCOSE: 108 mg/dL — AB (ref 65–99)
Potassium: 4.4 mmol/L (ref 3.5–5.1)
Sodium: 137 mmol/L (ref 135–145)

## 2017-01-17 LAB — CBC
HEMATOCRIT: 27.9 % — AB (ref 39.0–52.0)
Hemoglobin: 9.1 g/dL — ABNORMAL LOW (ref 13.0–17.0)
MCH: 28.3 pg (ref 26.0–34.0)
MCHC: 32.6 g/dL (ref 30.0–36.0)
MCV: 86.6 fL (ref 78.0–100.0)
PLATELETS: 216 10*3/uL (ref 150–400)
RBC: 3.22 MIL/uL — ABNORMAL LOW (ref 4.22–5.81)
RDW: 14.5 % (ref 11.5–15.5)
WBC: 15.7 10*3/uL — AB (ref 4.0–10.5)

## 2017-01-17 LAB — MAGNESIUM: Magnesium: 2.5 mg/dL — ABNORMAL HIGH (ref 1.7–2.4)

## 2017-01-17 LAB — RESPIRATORY PANEL BY PCR
ADENOVIRUS-RVPPCR: NOT DETECTED
Bordetella pertussis: NOT DETECTED
CHLAMYDOPHILA PNEUMONIAE-RVPPCR: NOT DETECTED
CORONAVIRUS NL63-RVPPCR: NOT DETECTED
CORONAVIRUS OC43-RVPPCR: NOT DETECTED
Coronavirus 229E: NOT DETECTED
Coronavirus HKU1: NOT DETECTED
INFLUENZA A-RVPPCR: NOT DETECTED
INFLUENZA B-RVPPCR: NOT DETECTED
Metapneumovirus: NOT DETECTED
Mycoplasma pneumoniae: NOT DETECTED
PARAINFLUENZA VIRUS 1-RVPPCR: NOT DETECTED
PARAINFLUENZA VIRUS 3-RVPPCR: NOT DETECTED
Parainfluenza Virus 2: NOT DETECTED
Parainfluenza Virus 4: NOT DETECTED
RESPIRATORY SYNCYTIAL VIRUS-RVPPCR: NOT DETECTED
Rhinovirus / Enterovirus: NOT DETECTED

## 2017-01-17 LAB — URINE CULTURE

## 2017-01-17 LAB — INFLUENZA PANEL BY PCR (TYPE A & B)
INFLAPCR: NEGATIVE
Influenza B By PCR: NEGATIVE

## 2017-01-17 LAB — LEGIONELLA PNEUMOPHILA SEROGP 1 UR AG: L. pneumophila Serogp 1 Ur Ag: NEGATIVE

## 2017-01-17 MED ORDER — HYDROMORPHONE HCL 1 MG/ML IJ SOLN: 0.5000 mg | INTRAMUSCULAR | Status: DC | PRN

## 2017-01-17 MED ORDER — HYDROMORPHONE HCL 1 MG/ML IJ SOLN
0.2500 mg | INTRAMUSCULAR | Status: DC | PRN
  Administered 2017-01-17: 0.5 mg via INTRAVENOUS
  Administered 2017-01-17: 0.25 mg via INTRAVENOUS
  Filled 2017-01-17 (×2): qty 0.5

## 2017-01-17 MED ORDER — OSELTAMIVIR PHOSPHATE 30 MG PO CAPS
30.0000 mg | ORAL_CAPSULE | Freq: Every day | ORAL | Status: DC
  Filled 2017-01-17: qty 1

## 2017-01-17 MED ORDER — HALOPERIDOL LACTATE 5 MG/ML IJ SOLN: 1.0000 mg | INTRAMUSCULAR | Status: DC | PRN

## 2017-01-17 MED ORDER — HYDROMORPHONE HCL 1 MG/ML IJ SOLN: 1.0000 mg | INTRAMUSCULAR | Status: DC | PRN

## 2017-01-17 MED ORDER — HALOPERIDOL LACTATE 5 MG/ML IJ SOLN: 1.0000 mg | Freq: Four times a day (QID) | INTRAMUSCULAR | Status: DC | PRN

## 2017-01-17 MED ORDER — OSELTAMIVIR PHOSPHATE 75 MG PO CAPS
75.0000 mg | ORAL_CAPSULE | Freq: Every day | ORAL | Status: DC
  Filled 2017-01-17: qty 1

## 2017-01-17 MED ORDER — VANCOMYCIN HCL 500 MG IV SOLR
500.0000 mg | INTRAVENOUS | Status: DC
  Administered 2017-01-17: 500 mg via INTRAVENOUS
  Filled 2017-01-17: qty 500

## 2017-01-17 MED ORDER — GLYCOPYRROLATE 0.2 MG/ML IJ SOLN
0.2000 mg | Freq: Three times a day (TID) | INTRAMUSCULAR | Status: DC
  Administered 2017-01-17 (×2): 0.2 mg via INTRAVENOUS
  Filled 2017-01-17 (×2): qty 1

## 2017-01-17 MED ORDER — HYDROMORPHONE HCL 1 MG/ML IJ SOLN
1.0000 mg | INTRAMUSCULAR | Status: DC
  Administered 2017-01-17: 1 mg via INTRAVENOUS
  Filled 2017-01-17 (×2): qty 1

## 2017-01-17 MED ORDER — PANTOPRAZOLE SODIUM 40 MG IV SOLR: 40.0000 mg | INTRAVENOUS | Status: DC

## 2017-01-17 MED ORDER — SODIUM CHLORIDE 0.9 % IV BOLUS (SEPSIS)
250.0000 mL | Freq: Once | INTRAVENOUS | Status: AC
  Administered 2017-01-17: 250 mL via INTRAVENOUS

## 2017-01-17 MED ORDER — DEXTROSE 5 % IV SOLN
1.0000 g | INTRAVENOUS | Status: DC
  Administered 2017-01-17: 1 g via INTRAVENOUS
  Filled 2017-01-17 (×2): qty 1

## 2017-01-17 MED ORDER — SODIUM CHLORIDE 0.9 % IV BOLUS (SEPSIS): 500.0000 mL | Freq: Once | INTRAVENOUS | Status: DC
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
## 2017-01-17 NOTE — Progress Notes (Signed)
PT Cancellation Note  Patient Details Name: Cristian Collins MRN: [id number] DOB: December 02, 1929   Cancelled Treatment:    Reason Eval/Treat Not Completed: PT screened, no needs identified, will sign off;Other (comment) (actively declining).  Signing off at this time. 02/08/2017  Donnella Sham, [place] [phone]  (pager)   Cristian Collins 02/06/2017, 12:24 PM

## 2017-01-17 NOTE — Progress Notes (Signed)
Pt noted agonal breathing, daughter at bedside, HR 30, pt is DNR, will continue to monitor pt

## 2017-01-17 NOTE — Progress Notes (Signed)
PROGRESS NOTE    Cristian Collins  [id number] DOB: 17-Apr-1930 DOA: 01/14/2017 PCP: Glendale Chard, MD   Brief Narrative:  83 y.o.BM PMHx COPD on 3L O2, CAD status post stents, and CHF HTN, PVD, HLD, Prostate cancer , who was d/c from the hospital 01/10/17 after tx for a COPD exacerbation.    He returned to the ED w/ acute severe SOB w/ wheezing.     Subjective: 6/8 patient unresponsive, in respiratory distress. Daughter speaking with NP Charlynn Court ([place]). Per daughter patient has been having increasing episodes of acute on chronic respiratory failure requiring hospitalizations. While at bedside NP Dunn and daughter spoke with wife discussed patient's probable outcome and it was decided to make patient Current level of care no escalation. Patient also made DO NOT RESUSCITATE     Assessment & Plan:   Active Problems:   Protein-calorie malnutrition, severe (HCC)   Essential hypertension   Lung nodule   Chronic respiratory failure with hypoxia and hypercapnia (HCC)   CAD (coronary artery disease)   Normocytic anemia   Anemia, iron deficiency   Acute respiratory failure with hypoxia (HCC)   Pneumonia   Atrial fibrillation with RVR ([place])   HCAP (healthcare-associated pneumonia)   Lobar pneumonia (HCC)   Acute recurrent on chronic hypoxic respiratory failure with severe oxygen dependent COPD -Patient appears to be actively dying. Unlikely to survive this hospitalization. Wife and daughter are aware -Solu-Medrol 40 mg QID  -DuoNeb QID -Albuterol PRN -Guaifenesin BID  HCAP /Multifocal PNA unspecified organism -Present during prior admit 5/25 > 6/1  -Continue current antibiotics no additional escalation of care  Chronic atrial fibrillation with acute RVR -RVR-related to acute respiratory distress and anxiety  -Continue Cardizem drip -Metoprolol 12.5 mg BID  Chronic Diastolic CHF  -Normal systolic function with grade 1 diastolic dysfunction via TTE 01/09/2017    - no evidence of significant volume overload  CAD   Hypotensive/HTN -Discontinue metoprolol, Cardizem.   HLD  Acute on chronic renal failure (baseline creatinine 0.95) Lab Results  Component Value Date   CREATININE 2.12 (H) 01/19/2017   CREATININE 1.56 (H) 01/16/2017   CREATININE 1.95 (H) 01/24/2017   - likely related to hypovolemia - follow with gentle hydration  Anemia of chronic kidney disease -Stable - no evidence of acute blood loss   Severe protein calorie malnutrition with deconditioning   DVT prophylaxis: Lovenox Code Status:  DO NOT RESUSCITATE Family Communication: NP Dunn and daughter spoke with wife  Disposition Plan:  continue current level of care No Escalation       Consultants:  None    Procedures/Significant Events:  5/31 echocardiogram:Left ventricle: The cavity size was normal. LVEF -WNL  - (grade 1 diastolic dysfunction). 6/7 PCXR:multifocal pneumonia with some increased density in the RIGHT upper lobe.   VENTILATOR SETTINGS: None   Cultures None  Antimicrobials: Anti-infectives    Start     Stop   01/31/2017 1600  vancomycin (VANCOCIN) 500 mg in sodium chloride 0.9 % 100 mL IVPB  Status:  Discontinued     01/16/17 1456    1530  vancomycin (VANCOCIN) 500 mg in sodium chloride 0.9 % 100 mL IVPB         01/16/2017 1015  ceFEPIme (MAXIPIME) 1 g in dextrose 5 % 50 mL IVPB     01/25/17 1014   02/06/2017 1015  oseltamivir (TAMIFLU) capsule 75 mg  Status:  Discontinued     01/13/2017 0944   02/01/2017 1015  oseltamivir (TAMIFLU) capsule 30  mg     01/22/17 0959   01/10/2017 1530  ceFEPIme (MAXIPIME) 1 g in dextrose 5 % 50 mL IVPB  Status:  Discontinued     01/16/17 1504   01/31/2017 1515  vancomycin (VANCOCIN) IVPB 1000 mg/200 mL premix     01/24/2017 1645       Devices None   LINES / TUBES:  None    Continuous Infusions: . sodium chloride 50 mL/hr (01/16/17 1554)  . diltiazem (CARDIZEM) infusion 10 mg/hr (01/22/2017  0237)     Objective: Vitals:   01/20/2017 0300 01/20/2017 0340 02/05/2017 0430  0831  BP: (!) 115/54     Pulse: 97 96    Resp: (!) 21 19    Temp:      TempSrc:      SpO2: 100% 99%  100%  Weight:   92 lb 6.4 oz (41.9 kg)   Height:        Intake/Output Summary (Last 24 hours) at 01/16/2017 0851 Last data filed at 02/05/2017 0655  Gross per 24 hour  Intake           416.17 ml  Output             1000 ml  Net          -583.83 ml   Filed Weights   01/30/2017 1902 01/16/17 0406 01/27/2017 0430  Weight: 90 lb 2.7 oz (40.9 kg) 91 lb 11.4 oz (41.6 kg) 92 lb 6.4 oz (41.9 kg)    Examination:  General: Patient unresponsive, positive acute on chronic respiratory distress, cachectic Eyes: negative scleral hemorrhage, negative anisocoria, negative icterus ENT: Negative Runny nose, negative gingival bleeding, Neck:  Negative scars, masses, torticollis, lymphadenopathy, JVD Lungs: tachypnea, extremity poor air movement diffusely, positive bilateral wheezing, bilateral upper extremity loss of breath sounds, negative crackles  Cardiovascular: Tachycardic, negative murmur gallop or rub normal S1 and S2 Abdomen: negative abdominal pain, nondistended, positive soft, bowel sounds, no rebound, no ascites, no appreciable mass Extremities: No significant cyanosis, clubbing, or edema bilateral lower extremities Skin: Negative rashes, lesions, ulcers Psychiatric:  Unable to evaluate. Patient unresponsive.  Central nervous system:  Unable to evaluate. Patient unresponsive.  .     Data Reviewed: Care during the described time interval was provided by me .  I have reviewed this patient's available data, including medical history, events of note, physical examination, and all test results as part of my evaluation. I have personally reviewed and interpreted all radiology studies.  CBC:  Recent Labs Lab 01/22/2017 1417 01/16/17 0329  0520  WBC 26.4* 12.5* 15.7*  NEUTROABS 24.5*  --   --   HGB  11.6* 10.1* 9.1*  HCT 34.4* 30.3* 27.9*  MCV 84.9 86.8 86.6  PLT 269 205 161   Basic Metabolic Panel:  Recent Labs Lab 01/31/2017 1417 01/16/17 0503 02/08/2017 0520  NA 133* 136 137  K 4.3 3.6 4.4  CL 91* 96* 98*  CO2 27 27 26   GLUCOSE 99 67 108*  BUN 44* 43* 65*  CREATININE 1.95* 1.56* 2.12*  CALCIUM 8.9 7.7* 7.7*  MG  --   --  2.5*   GFR: Estimated Creatinine Clearance: 15.4 mL/min (A) (by C-G formula based on SCr of 2.12 mg/dL (H)). Liver Function Tests:  Recent Labs Lab 01/16/17 0503  AST 28  ALT 15*  ALKPHOS 60  BILITOT 1.1  PROT 4.9*  ALBUMIN 1.9*   No results for input(s): LIPASE, AMYLASE in the last 168 hours. No results for input(s):  AMMONIA in the last 168 hours. Coagulation Profile:  Recent Labs Lab 01/16/17 0329  INR 1.28   Cardiac Enzymes: No results for input(s): CKTOTAL, CKMB, CKMBINDEX, TROPONINI in the last 168 hours. BNP (last 3 results) No results for input(s): PROBNP in the last 8760 hours. HbA1C: No results for input(s): HGBA1C in the last 72 hours. CBG: No results for input(s): GLUCAP in the last 168 hours. Lipid Profile: No results for input(s): CHOL, HDL, LDLCALC, TRIG, CHOLHDL, LDLDIRECT in the last 72 hours. Thyroid Function Tests: No results for input(s): TSH, T4TOTAL, FREET4, T3FREE, THYROIDAB in the last 72 hours. Anemia Panel: No results for input(s): VITAMINB12, FOLATE, FERRITIN, TIBC, IRON, RETICCTPCT in the last 72 hours. Urine analysis:    Component Value Date/Time   COLORURINE YELLOW 01/14/2017 2047   APPEARANCEUR CLOUDY (A) 01/22/2017 2047   LABSPEC 1.011 01/16/2017 2047   PHURINE 5.0 01/22/2017 2047   GLUCOSEU NEGATIVE 01/14/2017 2047   HGBUR NEGATIVE 02/04/2017 2047   BILIRUBINUR NEGATIVE 01/14/2017 2047   [place] NEGATIVE 02/07/2017 2047   PROTEINUR 30 (A) 01/23/2017 2047   UROBILINOGEN 0.2 10/08/2014 0838   NITRITE NEGATIVE 01/16/2017 2047   LEUKOCYTESUR NEGATIVE 01/30/2017 2047   Sepsis  Labs: @LABRCNTIP (procalcitonin:4,lacticidven:4)  ) Recent Results (from the past 240 hour(s))  MRSA PCR Screening     Status: None   Collection Time: 01/13/2017  4:55 PM  Result Value Ref Range Status   MRSA by PCR NEGATIVE NEGATIVE Final    Comment:        The GeneXpert MRSA Assay (FDA approved for NASAL specimens only), is one component of a comprehensive MRSA colonization surveillance program. It is not intended to diagnose MRSA infection nor to guide or monitor treatment for MRSA infections.   Culture, blood (routine x 2) Call MD if unable to obtain prior to antibiotics being given     Status: None (Preliminary result)   Collection Time: 02/02/2017  6:48 PM  Result Value Ref Range Status   Specimen Description BLOOD RIGHT HAND  Final   Special Requests   Final    BOTTLES DRAWN AEROBIC ONLY Blood Culture adequate volume   Culture NO GROWTH < 24 HOURS  Final   Report Status PENDING  Incomplete  Culture, blood (routine x 2) Call MD if unable to obtain prior to antibiotics being given     Status: None (Preliminary result)   Collection Time: 01/19/2017  6:56 PM  Result Value Ref Range Status   Specimen Description BLOOD LEFT HAND  Final   Special Requests   Final    BOTTLES DRAWN AEROBIC ONLY Blood Culture adequate volume   Culture NO GROWTH < 24 HOURS  Final   Report Status PENDING  Incomplete  Culture, Urine     Status: Abnormal   Collection Time: 01/13/2017  8:48 PM  Result Value Ref Range Status   Specimen Description URINE, RANDOM  Final   Special Requests NONE  Final   Culture MULTIPLE SPECIES PRESENT, SUGGEST RECOLLECTION (A)  Final   Report Status 02/07/2017 FINAL  Final         Radiology Studies: Dg Chest Port 1 View  Result Date: 01/16/2017 CLINICAL DATA:  Short of breath, pneumonia EXAM: PORTABLE CHEST 1 VIEW COMPARISON:  01/16/2015 FINDINGS: Normal cardiac silhouette. Dense confluent RIGHT upper lobe airspace disease is not improved and slightly increased in  density. LEFT upper lobe in lower lobe airspace disease is similar. Lungs are hyperinflated. IMPRESSION: No improvement in multifocal pneumonia with some increased density in  the RIGHT upper lobe. Electronically Signed   By: Suzy Bouchard M.D.   On: 01/16/2017 11:58   Dg Chest Port 1 View  Result Date: 02/07/2017 CLINICAL DATA:  83 year old with acute onset of shortness of breath that began at approximately 3 o'clock this morning. Oxygen dependent COPD. Two-day history of cough. EXAM: PORTABLE CHEST 1 VIEW COMPARISON:  01/03/2017, 07/18/2016 and earlier, including CT chest 07/18/2016 and earlier. FINDINGS: Since the most recent examination 01/03/2017, development of airspace opacities in the upper lobes bilaterally, right greater than left. This is superimposed upon severe changes of emphysema as noted on prior CTs. Calcified pleural plaques along the right hemidiaphragm as noted previously. Cardiac silhouette normal in size, unchanged. Extensive LAD coronary atherosclerotic calcification is visible on the chest x-ray. Thoracic aorta mildly atherosclerotic. Hilar and mediastinal contours otherwise unremarkable. Generalized osseous demineralization. IMPRESSION: 1. Acute bilateral upper lobe pneumonia, right greater than left. 2. Calcified pleural plaques along the right hemidiaphragm indicating asbestos related pleural disease. Electronically Signed   By: Evangeline Dakin M.D.   On: 02/02/2017 14:47        Scheduled Meds: . clonazePAM  0.5 mg Oral BID  . enoxaparin (LOVENOX) injection  30 mg Subcutaneous Q24H  . feeding supplement (ENSURE ENLIVE)  237 mL Oral TID BM  . fluticasone  1 spray Each Nare Daily  . guaiFENesin  1,200 mg Oral BID  . ipratropium-albuterol  3 mL Nebulization QID  . isosorbide mononitrate  60 mg Oral Daily  . mouth rinse  15 mL Mouth Rinse BID  . methylPREDNISolone (SOLU-MEDROL) injection  40 mg Intravenous Q6H  . metoprolol succinate  12.5 mg Oral BID  .  mometasone-formoterol  2 puff Inhalation BID  . pantoprazole  40 mg Oral Daily  . tiotropium  18 mcg Inhalation Daily   Continuous Infusions: . sodium chloride 50 mL/hr (01/16/17 1554)  . diltiazem (CARDIZEM) infusion 10 mg/hr (02/08/2017 0237)     LOS: 2 days    Time spent: 40 minutes    Tameka Hoiland, Geraldo Docker, MD [facility] Pager [phone]   If 7PM-7AM, please contact night-coverage [email or web address] Password [email or web address] 01/24/2017, 8:51 AM

## 2017-01-17 NOTE — Progress Notes (Signed)
   02/01/2017 1000  Clinical Encounter Type  Visited With Patient and family together  Visit Type Spiritual support;Other (Comment) (End of life)  Referral From Nurse  Consult/Referral To Chaplain  Spiritual Encounters  Spiritual Needs Prayer;Emotional  Stress Factors  Patient Stress Factors None identified  Family Stress Factors Exhausted;Family relationships;Health changes    Chaplain responded to [place] consult for end of life. Family at bedside. Patient sleeping (comfortably) at time of visit. Family seems to be coping well with current situation. Discussed next few hours and stages, potential outcomes. Provided ministry of presence, emotional support, and prayer. [place] L. Volanda Napoleon, MDiv

## 2017-01-17 NOTE — Progress Notes (Signed)
RN called because BP decreased to 56/28  HR 51,  Patient has been unresponsive all day RR 14 on NRB.  NS bolus infusing.  Dr Sherral Hammers at bedside spoke with daughter.  Per conversation: complete fluid bolus but do not start vasopressors and do not intubate.

## 2017-01-17 NOTE — Progress Notes (Signed)
OT Cancellation Note  Patient Details Name: Cristian Collins MRN: [id number] DOB: [birth year]   Cancelled Treatment:    Reason Eval/Treat Not Completed: Other (comment). All family at bedside with RN and respiratory in room--pt actively declining medically, not appropriate for continued OT, we will D/C.  Almon Register [id number] 01/23/2017, 12:12 PM

## 2017-01-17 NOTE — Progress Notes (Signed)
Dr. Tamala Julian notified of pt being very hard to arouse, no urine output noted, VSS, orders received  daughter called and updated on pts current status and advised to come to hospital as soon as she can

## 2017-01-17 NOTE — Progress Notes (Addendum)
PHARMACY NOTE:  ANTIMICROBIAL RENAL DOSAGE ADJUSTMENT  Current antimicrobial regimen includes a mismatch between antimicrobial dosage and estimated renal function.  As per policy approved by the Pharmacy & Therapeutics and Medical Executive Committees, the antimicrobial dosage will be adjusted accordingly.  Current antimicrobial dosage:  Cefepime 1g q8h and tamiflu 75mg  q12h  Indication: pna and flu  Renal Function:  Estimated Creatinine Clearance: 15.4 mL/min (A) (by C-G formula based on SCr of 2.12 mg/dL (H)). []      On intermittent HD, scheduled: []      On CRRT    Antimicrobial dosage has been changed to:  Cefepime 1g q24h and tamiflu 30mg  q24h   Rebecka Apley, [facility] 01/20/2017 9:42 AM

## 2017-01-17 NOTE — Progress Notes (Signed)
Entered pts room to find him in bed with labored breathing, appears to be in significant respiratory distress with little air movement in lung sounds upon assessment. Paged MD and palliative care about decline in pts status, daughter at bedside, awaiting further direction from MD and palliative.

## 2017-01-17 NOTE — Progress Notes (Signed)
Palliative Medicine RN Note: Follow up visit from the AM meeting with PMT NP Charlynn Court. Pt has gotten two doses of hydromorphone and a dose of lorazepam. Wife, sister in law, and daughter at bedside. Pt is on NRB and breathing 35-40 times a minute, breathing very hard. Spoke with RN, who reports that the 0.25 mg dose of hydromorphone seemed to wear off after about 30 minutes, and the 0.5 mg dose wore off after about an hour.   Spoke with family at bedside. They feel that pt is not comfortable. Discussed with family. They agree that he is working very hard to breathe. Explained that Mr Fults is breathing very hard and that we have concerns that he will tire out quickly breathing this way. Plan to request adjustments to medication regimen to allow him to breathe more comfortably while we give the other treatments every opportunity to help him; family agrees that this is what they want.   Spoke with PMT NP Charlynn Court. She placed orders for Dilaudid adjustments. RN is aware of changes and has contact information for PMT. Plan for PMT follow up this afternoon.  Marjie Skiff Monzerat Handler, RN, BSN, [facility] 02/05/2017 1:41 PM Cell [phone] 8:00-4:00 Monday-Friday Office [phone]

## 2017-01-17 NOTE — Progress Notes (Signed)
Call from bedside RN regarding drop in BP.  RN stated that she was unable to get Dr Sherral Hammers, the pt's attending.  As PCCM has never seen this pt and eMD was dealing with another situation, this Elink RN requested for staff to call rapid response. Dr Oletta Darter notified of situation once he was available.

## 2017-01-17 NOTE — Progress Notes (Signed)
Dr. Hal Hope notified of pts time of death

## 2017-01-18 DIAGNOSIS — N179 Acute kidney failure, unspecified: Secondary | ICD-10-CM

## 2017-01-20 LAB — BLOOD GAS, ARTERIAL
Acid-base deficit: 1.4 mmol/L (ref 0.0–2.0)
BICARBONATE: 24.5 mmol/L (ref 20.0–28.0)
DRAWN BY: 419771
O2 Content: 3 L/min
O2 Saturation: 98 %
PCO2 ART: 54.6 mmHg — AB (ref 32.0–48.0)
Patient temperature: 98.6
pH, Arterial: 7.275 — ABNORMAL LOW (ref 7.350–7.450)
pO2, Arterial: 152 mmHg — ABNORMAL HIGH (ref 83.0–108.0)

## 2017-01-20 LAB — CULTURE, BLOOD (ROUTINE X 2)
CULTURE: NO GROWTH
Culture: NO GROWTH
SPECIAL REQUESTS: ADEQUATE
SPECIAL REQUESTS: ADEQUATE

## 2017-02-09 NOTE — Death Summary Note (Signed)
Death Summary  Cristian Collins [id number]:[id number] DOB: May 12, 1930 DOA: 01/29/17  PCP: Glend[place] Chard, MD PCP/Office notified:NO  Admit date: Jan 29, 2017 Date of Death: 2017/02/20  Final Diagnoses:  Active Problems:   Protein-calorie malnutrition, severe (HCC)   Essential hypertension   Lung nodule   Chronic respiratory failure with hypoxia and hypercapnia (HCC)   CAD (coronary artery disease)   Normocytic anemia   Anemia, iron deficiency   Acute respiratory failure with hypoxia (HCC)   Pneumonia   Atrial fibrillation with RVR (HCC)   HCAP (healthcare-associated pneumonia)   Lobar pneumonia (HCC)   COPD, very severe ([place])   Palliative care by specialist    Acute recurrent on chronic hypoxic respiratory failure with severe oxygen dependent COPD/severe COPD -Patient appears to be actively dying. Unlikely to survive this hospitalization. Wife and daughter are aware -RN Salamatu S[place] Notified Dr. Hal Hope of patient's death on 02-01-2023 at 11-22-49  HCAP /Multifocal PNA unspecified organism -Present during prior admit 5/25 >6/1  -Continue current antibiotics no additional escalation of care -Wife and daughter where that patient will not survive this hospitalization  Chronic atrial fibrillation with acute RVR -RVR-related to acute respiratory distress and anxiety   Chronic Diastolic CHF  -Normal systolic function with grade 1 diastolic dysfunction via TTE 01/09/2017  - no evidence of significant volume overload  CAD   Hypotensive/essential HTN -Discontinue metoprolol, Cardizem.   HLD  Acute on chronic renal failure (baseline creatinine 0.95)   Anemia of chronic kidney disease -Stable - no evidence of acute blood loss   Severe protein calorie malnutrition with deconditioning       History of present illness:  83 y.o.BM PMHx COPD on 3L O2, CAD status post stents, and CHF HTN, PVD, HLD, Prostate cancer , who was d/c from the hospital 01/10/17 after tx for a  COPD exacerbation.   He returned to the ED w/ acute severe SOB w/ wheezing. Patient with end-stage COPD, daughter and wife aware patient unlikely to survive this hospitalization. Agreed to continue with current antibiotic treatment with no escalation of care. Unfortunately with aggressive COPD exacerbation treatment patient unable to recover. Expired on6/8 at 2049-11-22      Time: November 22, 2049  Signed:  Dia Crawford, MD [facility] [phone]

## 2017-02-09 DEATH — deceased

## 2017-04-04 ENCOUNTER — Ambulatory Visit: Payer: Medicare Other | Admitting: Internal Medicine

## 2017-08-25 IMAGING — DX DG CHEST 2V
2 series · 2 of 2 positions shown · non-contrast
Comparison: 08/12/2015 chest radiograph.

CLINICAL DATA: Shortness of breath.  COPD exacerbation.

EXAM:
CHEST  2 VIEW

[chest lat]
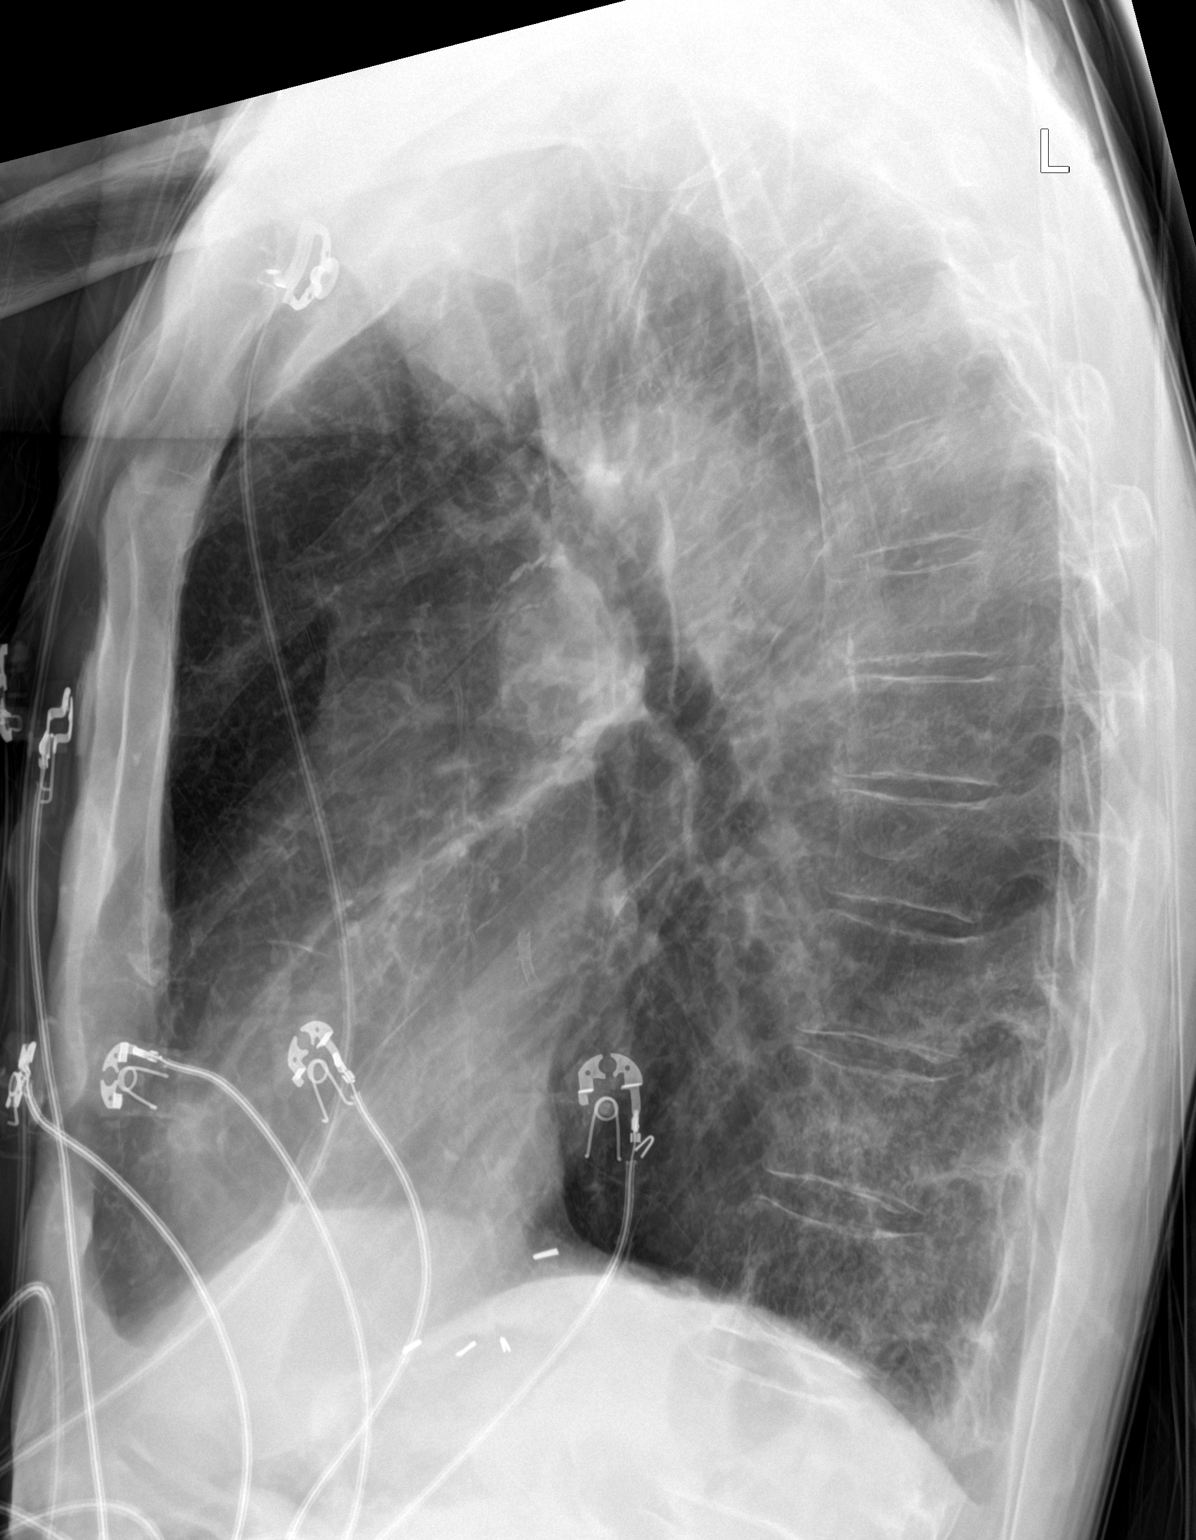

[chest ap]
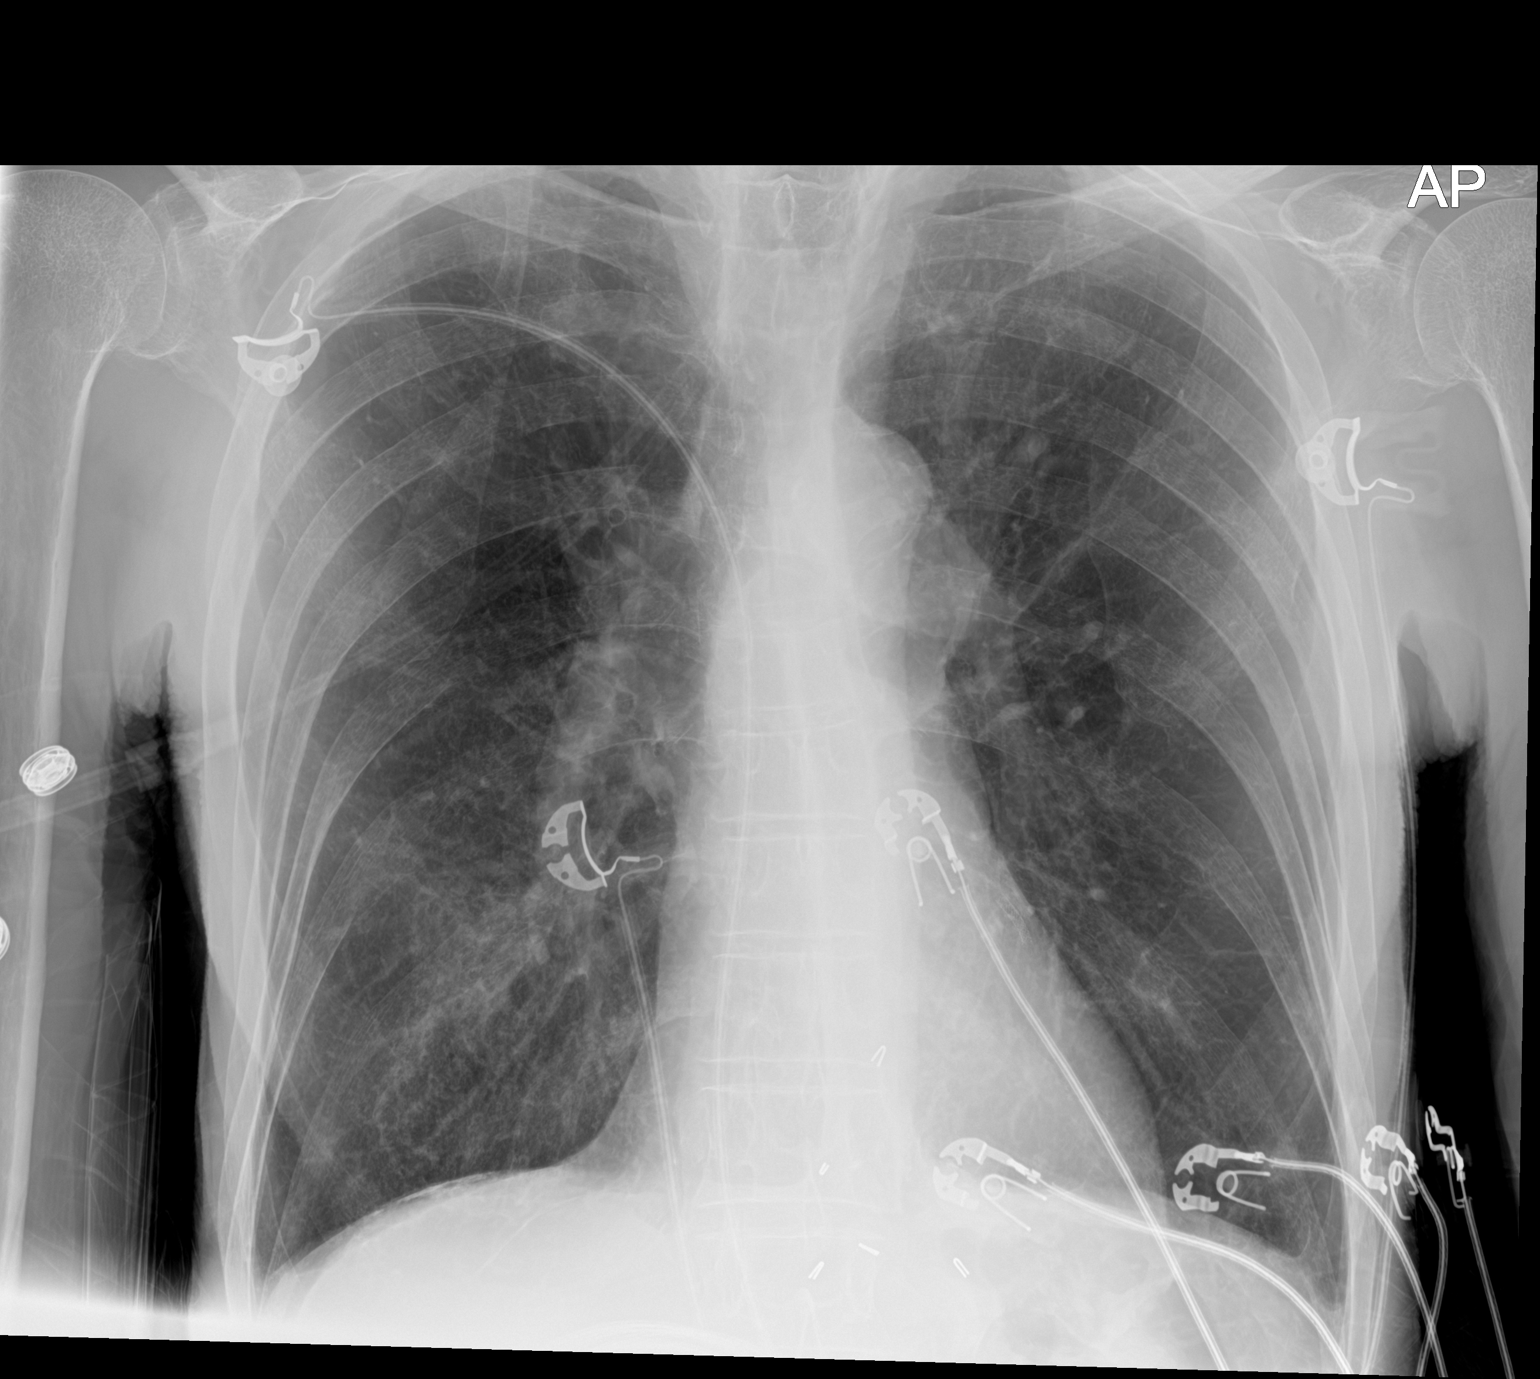

[2 of 2 positions shown; findings below may reference images not displayed]

FINDINGS: Stable cardiomediastinal silhouette with normal heart size. No
pneumothorax. No pleural effusion. Emphysema and hyperinflated
lungs. No pulmonary edema. Mild hazy opacity in the right lower lobe
appears new.
IMPRESSION: 1. Mild hazy right lower lobe opacity appears new and suggests right
lower lobe pneumonia or aspiration. Recommend follow-up PA and
lateral post treatment chest radiographs in 4-6 weeks.
2. Emphysema and hyperinflated lungs, consistent with the provided
history of COPD.

## 2017-10-17 IMAGING — DX DG CHEST 2V
2 series · 2 of 2 positions shown · non-contrast
Comparison: Chest CT, 02/11/2016 and chest radiographs, 02/10/2016

CLINICAL DATA: F/u pna. Pt c/o cough, sob. Hx of HTN, copd.

EXAM:
CHEST  2 VIEW

[chest pa]
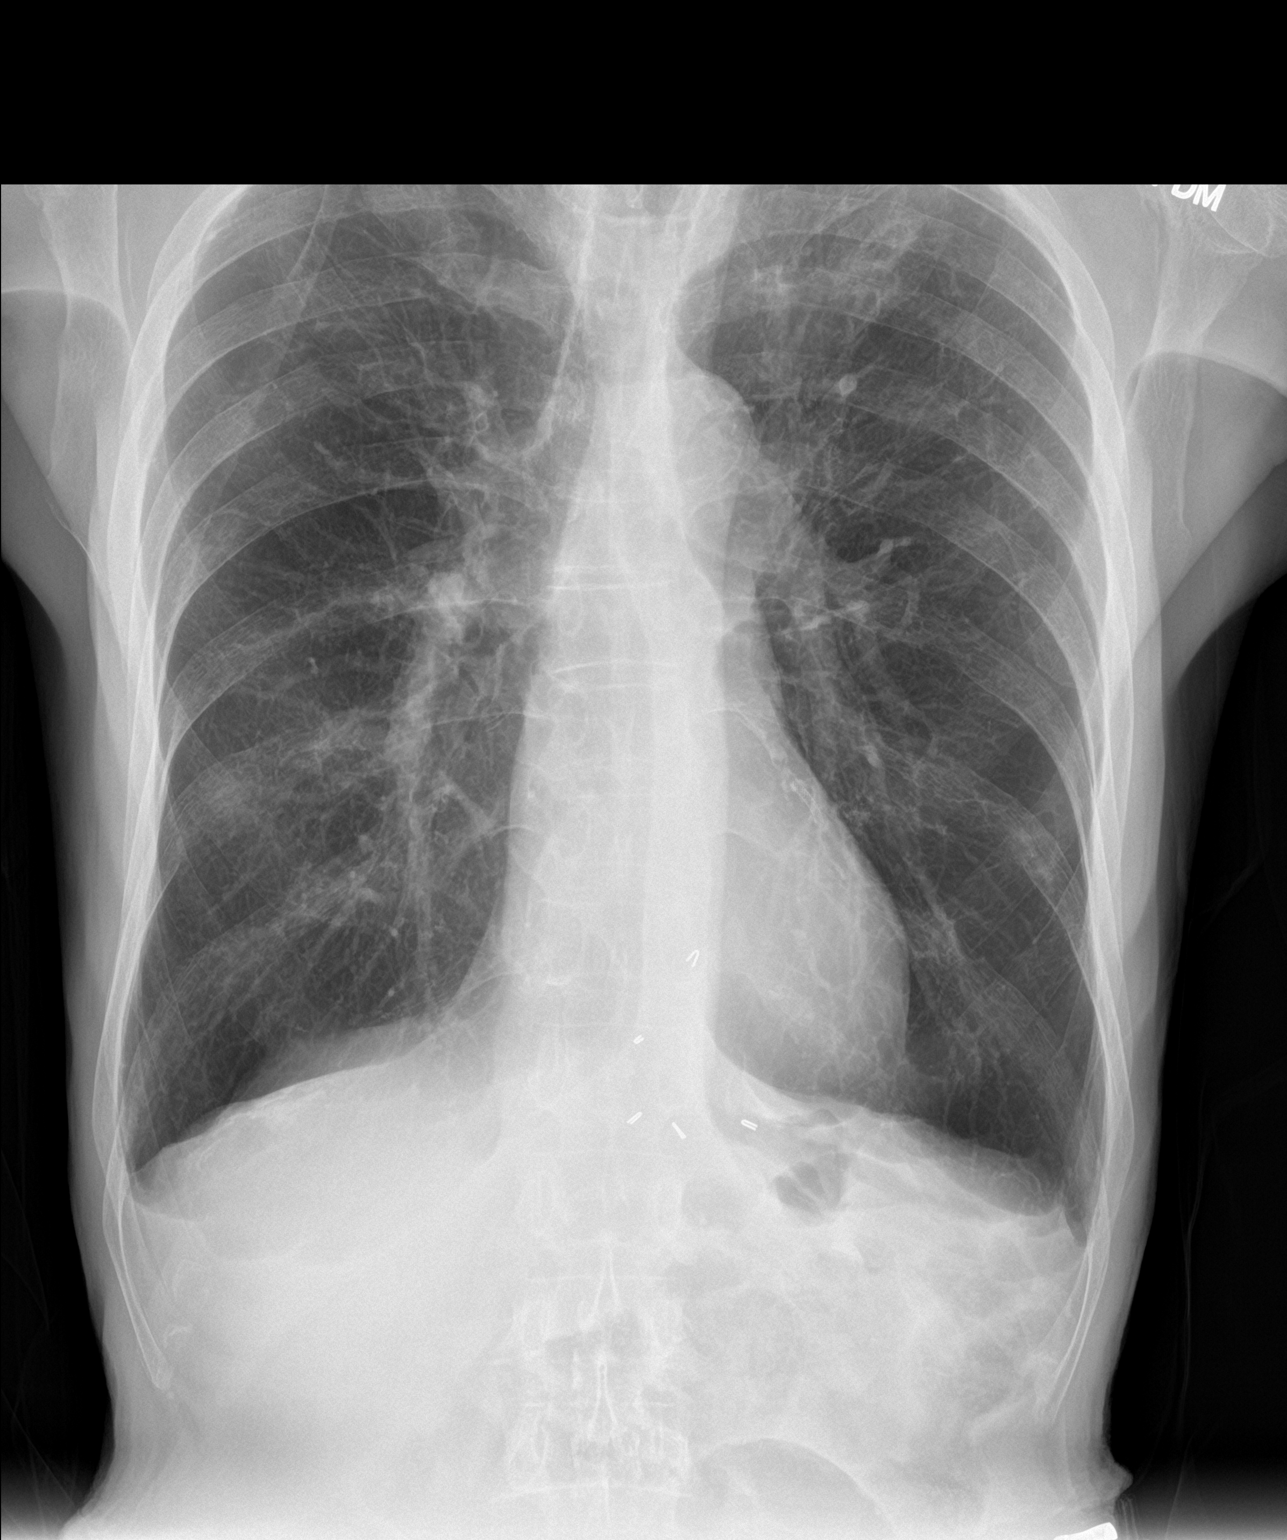

[chest lat]
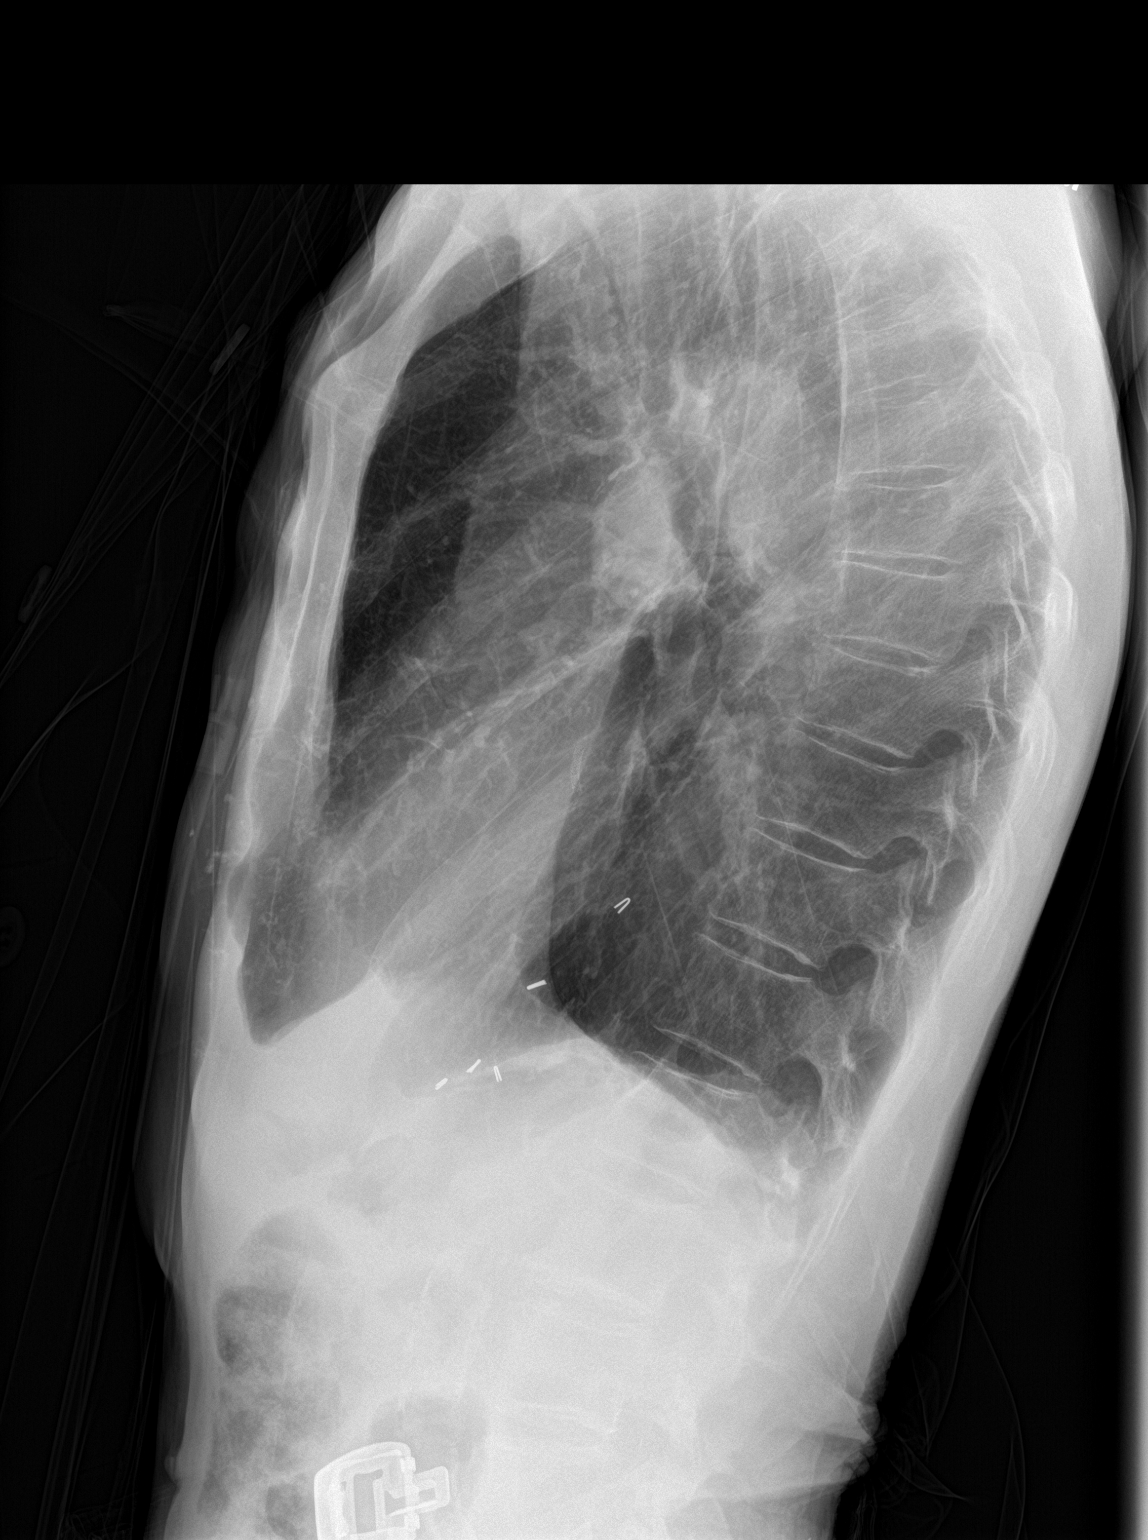

[2 of 2 positions shown; findings below may reference images not displayed]

FINDINGS: The left upper lobe opacity appears smaller and less dense on the
current study than on the prior chest radiograph. There are no new
lung opacities. Lungs remain hyperexpanded with flattened
hemidiaphragms consistent with COPD.

No pleural effusion or pneumothorax.

Cardiac silhouette is normal in size. Stable left coronary artery
stent. Normal mediastinal contours. No convincing hilar mass or
adenopathy.

Bony thorax is demineralized but intact.
IMPRESSION: 1. Interval decrease size and density in the previously described
focal left upper lobe opacity. This supports an
infectious/inflammatory etiology. Recommend follow-up in 4-6 weeks
to document resolution.
2. No new abnormalities.  Stable changes of advanced COPD.

## 2018-08-31 IMAGING — DX DG CHEST 2V
2 series · 2 of 2 positions shown · non-contrast
Comparison: 07/18/2016

CLINICAL DATA: Shortness of breath

EXAM:
CHEST  2 VIEW

[x chest ap]
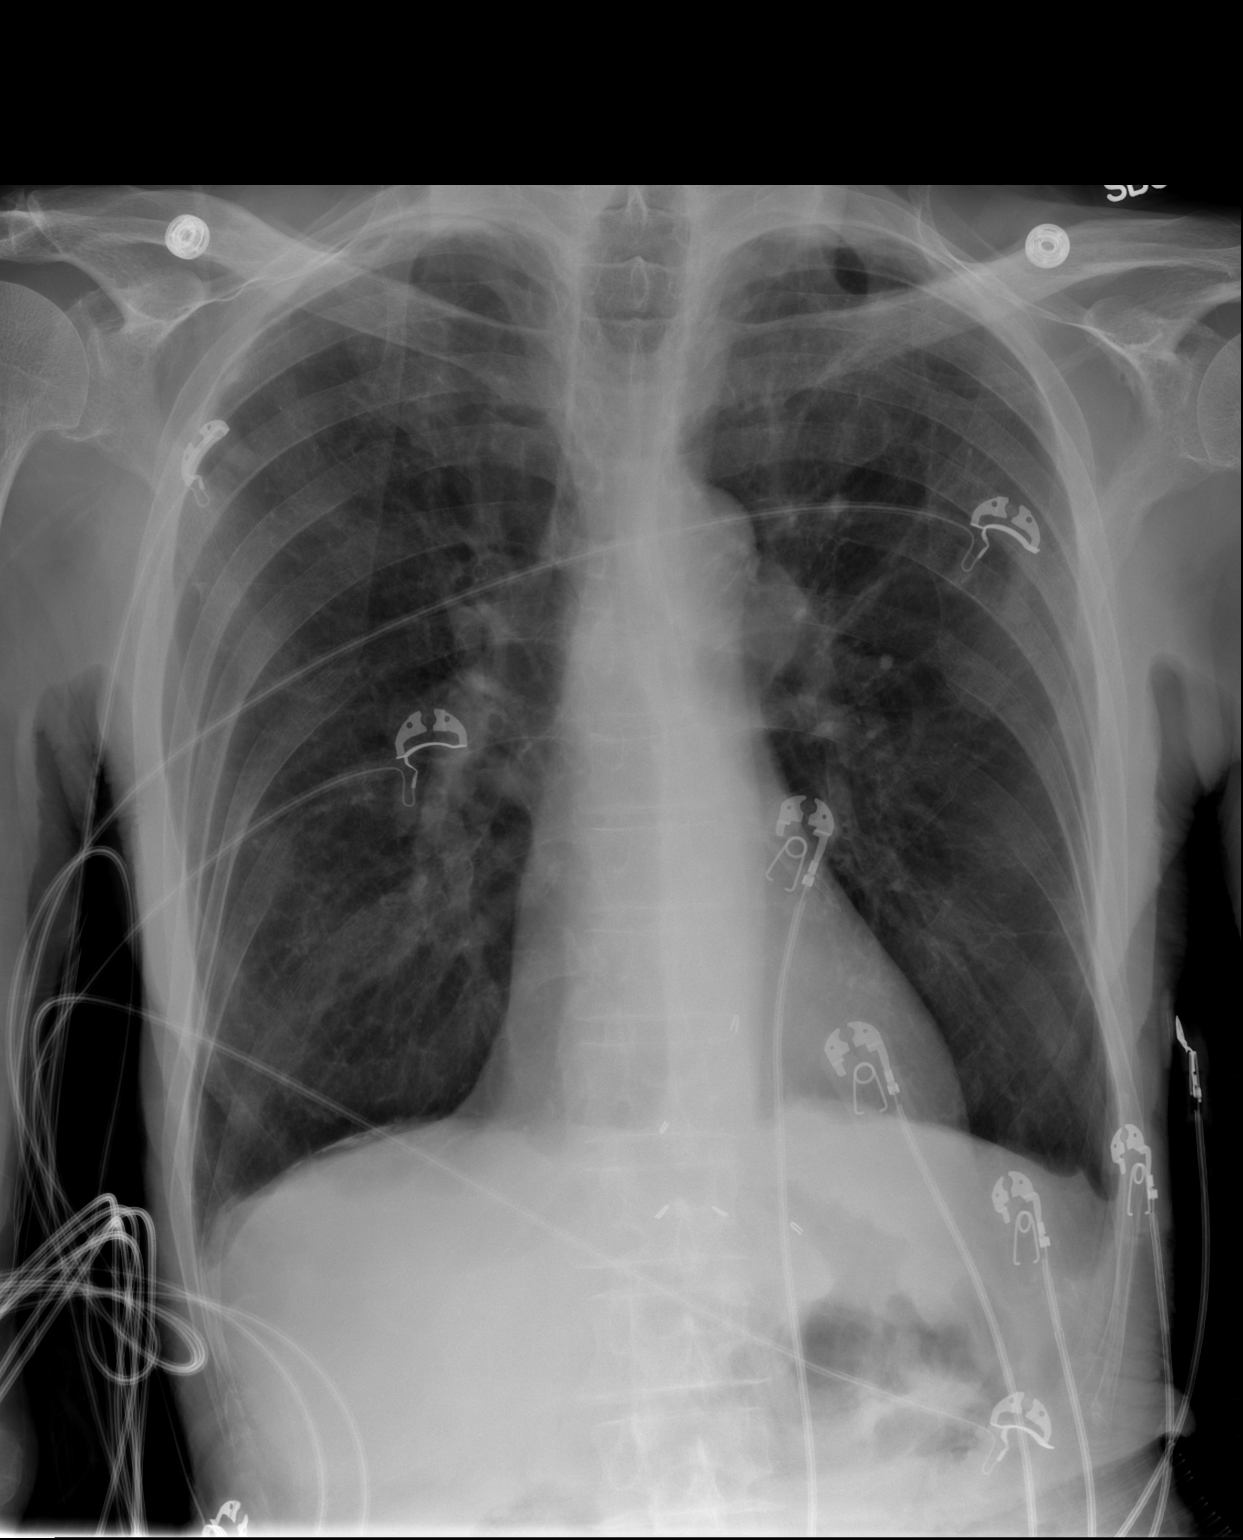

[w chest lat]
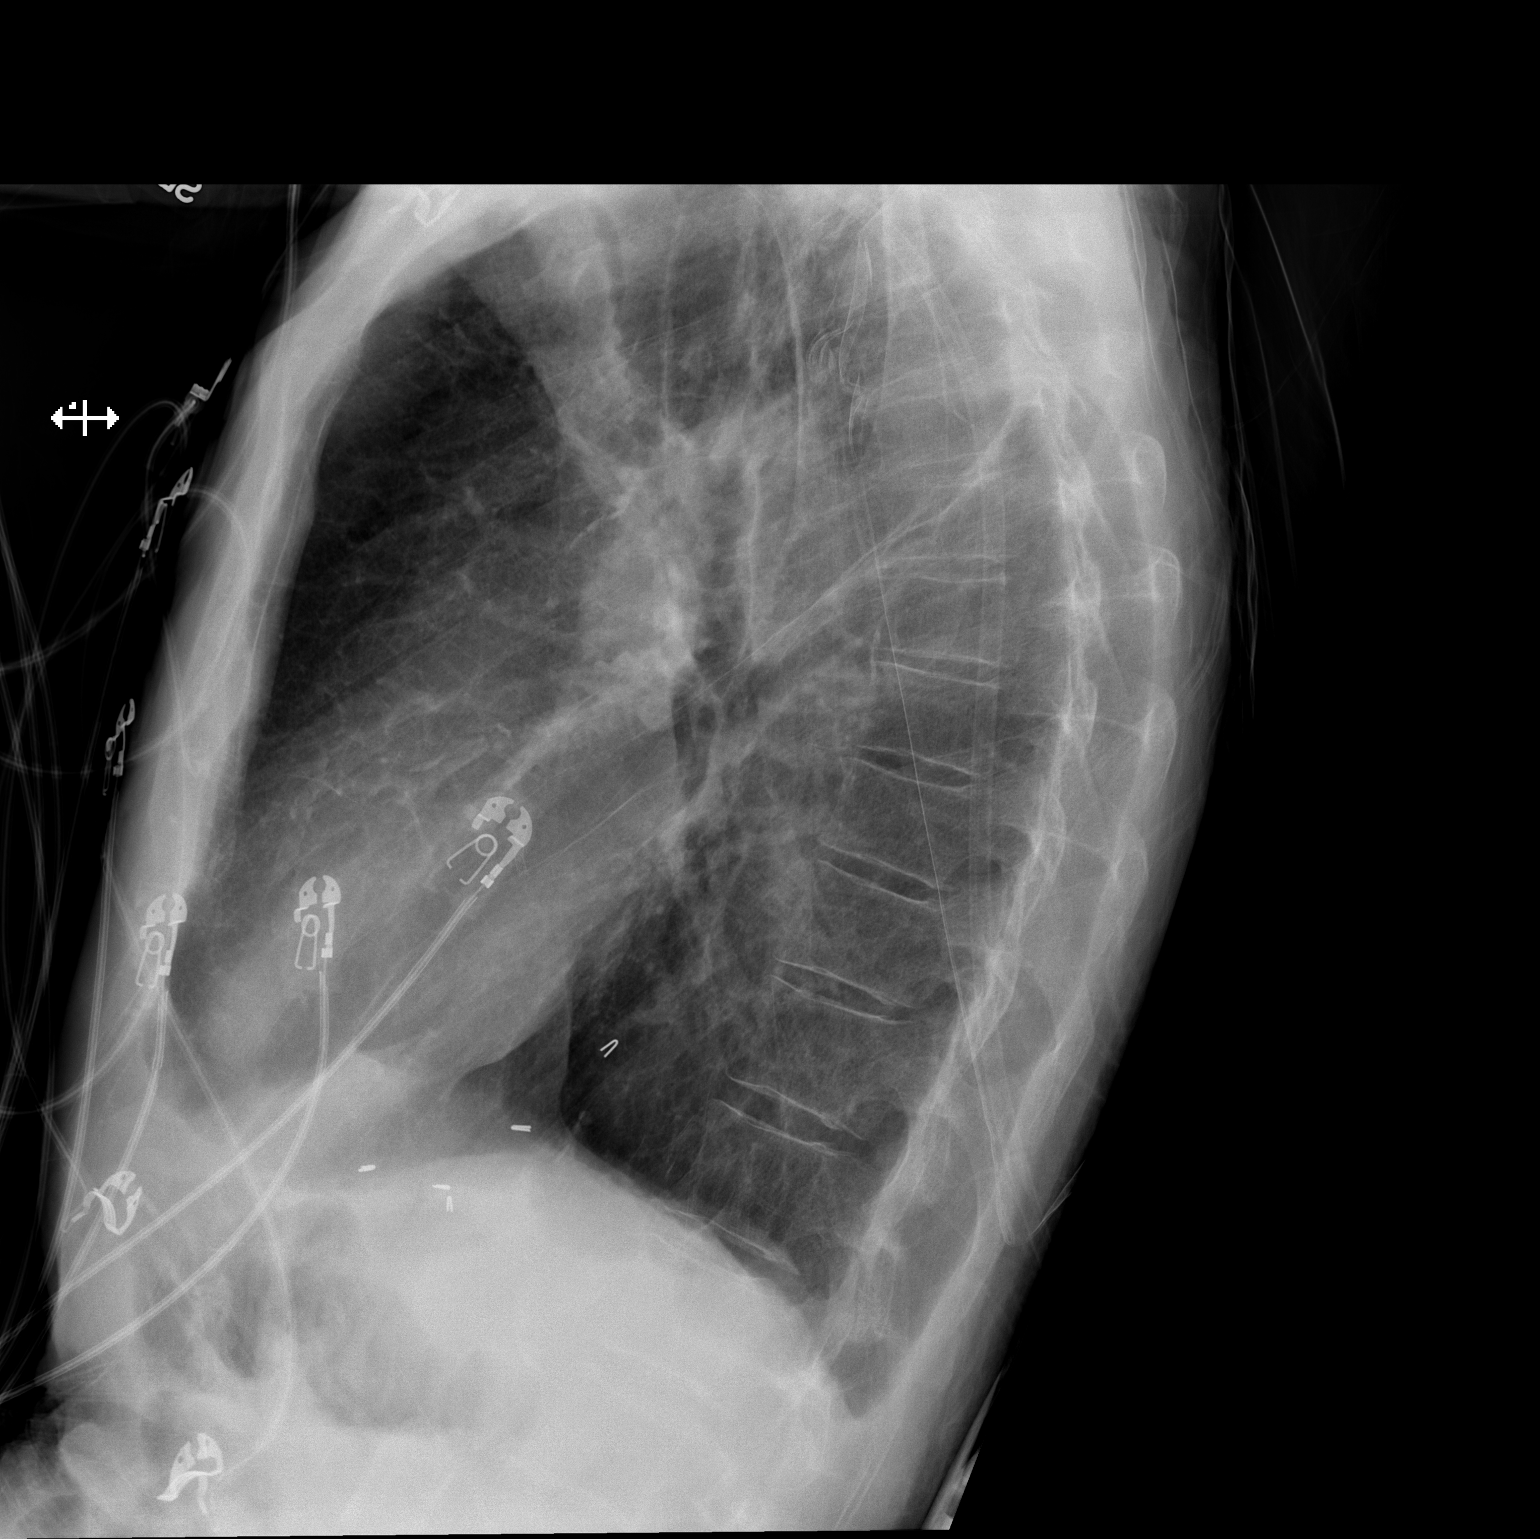

[2 of 2 positions shown; findings below may reference images not displayed]

FINDINGS: Normal heart size and stable mediastinal contours. Coronary stent
noted. COPD with hyperinflation. Calcified pleural plaque along the
right diaphragm. There is no edema, consolidation, effusion, or
pneumothorax. Nodular density at the left base is attributed to
nipple shadow. No acute osseous finding.
IMPRESSION: COPD without acute superimposed finding.
